# Patient Record
Sex: Female | Born: 1940 | Race: Black or African American | Hispanic: No | Smoking: Former smoker
Health system: Southern US, Community
[De-identification: ages and names within clinical notes are randomized; demographics above are authoritative.]

## PROBLEM LIST (undated history)

## (undated) DIAGNOSIS — M1990 Unspecified osteoarthritis, unspecified site: Secondary | ICD-10-CM

## (undated) DIAGNOSIS — H9312 Tinnitus, left ear: Secondary | ICD-10-CM

## (undated) DIAGNOSIS — K219 Gastro-esophageal reflux disease without esophagitis: Secondary | ICD-10-CM

## (undated) DIAGNOSIS — D249 Benign neoplasm of unspecified breast: Secondary | ICD-10-CM

## (undated) DIAGNOSIS — I773 Arterial fibromuscular dysplasia: Secondary | ICD-10-CM

## (undated) DIAGNOSIS — I739 Peripheral vascular disease, unspecified: Secondary | ICD-10-CM

## (undated) DIAGNOSIS — IMO0001 Reserved for inherently not codable concepts without codable children: Secondary | ICD-10-CM

## (undated) DIAGNOSIS — T7840XA Allergy, unspecified, initial encounter: Secondary | ICD-10-CM

## (undated) DIAGNOSIS — E785 Hyperlipidemia, unspecified: Secondary | ICD-10-CM

## (undated) DIAGNOSIS — H269 Unspecified cataract: Secondary | ICD-10-CM

## (undated) DIAGNOSIS — N189 Chronic kidney disease, unspecified: Secondary | ICD-10-CM

## (undated) DIAGNOSIS — I10 Essential (primary) hypertension: Secondary | ICD-10-CM

## (undated) DIAGNOSIS — Z923 Personal history of irradiation: Secondary | ICD-10-CM

## (undated) DIAGNOSIS — K5900 Constipation, unspecified: Secondary | ICD-10-CM

## (undated) DIAGNOSIS — K829 Disease of gallbladder, unspecified: Secondary | ICD-10-CM

## (undated) DIAGNOSIS — R0600 Dyspnea, unspecified: Secondary | ICD-10-CM

## (undated) HISTORY — DX: Constipation, unspecified: K59.00

## (undated) HISTORY — DX: Tinnitus, left ear: H93.12

## (undated) HISTORY — DX: Hyperlipidemia, unspecified: E78.5

## (undated) HISTORY — DX: Chronic kidney disease, unspecified: N18.9

## (undated) HISTORY — DX: Essential (primary) hypertension: I10

## (undated) HISTORY — DX: Gastro-esophageal reflux disease without esophagitis: K21.9

## (undated) HISTORY — PX: POLYPECTOMY: SHX149

## (undated) HISTORY — DX: Allergy, unspecified, initial encounter: T78.40XA

## (undated) HISTORY — DX: Reserved for inherently not codable concepts without codable children: IMO0001

## (undated) HISTORY — DX: Arterial fibromuscular dysplasia: I77.3

## (undated) HISTORY — PX: COLONOSCOPY: SHX174

## (undated) HISTORY — PX: UPPER GASTROINTESTINAL ENDOSCOPY: SHX188

## (undated) HISTORY — DX: Unspecified cataract: H26.9

## (undated) HISTORY — PX: TOTAL ABDOMINAL HYSTERECTOMY: SHX209

## (undated) HISTORY — PX: ANGIOPLASTY: SHX39

## (undated) HISTORY — PX: KNEE CARTILAGE SURGERY: SHX688

## (undated) HISTORY — DX: Unspecified osteoarthritis, unspecified site: M19.90

---

## 2002-01-06 ENCOUNTER — Encounter: Payer: Self-pay | Admitting: Family Medicine

## 2004-11-20 HISTORY — PX: BREAST BIOPSY: SHX20

## 2005-12-21 LAB — CONVERTED CEMR LAB: Pap Smear: NORMAL

## 2006-09-20 HISTORY — PX: OTHER SURGICAL HISTORY: SHX169

## 2006-12-13 ENCOUNTER — Encounter: Payer: Self-pay | Admitting: Family Medicine

## 2007-02-27 ENCOUNTER — Encounter: Payer: Self-pay | Admitting: Family Medicine

## 2007-03-13 ENCOUNTER — Ambulatory Visit: Payer: Self-pay | Admitting: Internal Medicine

## 2007-06-04 ENCOUNTER — Encounter: Payer: Self-pay | Admitting: Family Medicine

## 2007-06-04 LAB — CONVERTED CEMR LAB
Cholesterol: 231 mg/dL
Creatinine, Ser: 0.756 mg/dL
HDL: 50 mg/dL
LDL Cholesterol: 146 mg/dL
Triglycerides: 173 mg/dL

## 2007-08-28 ENCOUNTER — Ambulatory Visit: Payer: Self-pay | Admitting: Family Medicine

## 2007-08-28 DIAGNOSIS — E0590 Thyrotoxicosis, unspecified without thyrotoxic crisis or storm: Secondary | ICD-10-CM | POA: Insufficient documentation

## 2007-08-28 DIAGNOSIS — K219 Gastro-esophageal reflux disease without esophagitis: Secondary | ICD-10-CM | POA: Insufficient documentation

## 2007-08-28 DIAGNOSIS — K589 Irritable bowel syndrome without diarrhea: Secondary | ICD-10-CM | POA: Insufficient documentation

## 2007-08-28 DIAGNOSIS — E785 Hyperlipidemia, unspecified: Secondary | ICD-10-CM | POA: Insufficient documentation

## 2007-08-28 DIAGNOSIS — F411 Generalized anxiety disorder: Secondary | ICD-10-CM | POA: Insufficient documentation

## 2007-09-02 ENCOUNTER — Encounter: Admission: RE | Admit: 2007-09-02 | Discharge: 2007-09-02 | Payer: Self-pay | Admitting: Family Medicine

## 2007-09-03 ENCOUNTER — Encounter: Payer: Self-pay | Admitting: Family Medicine

## 2007-09-06 ENCOUNTER — Encounter (INDEPENDENT_AMBULATORY_CARE_PROVIDER_SITE_OTHER): Payer: Self-pay | Admitting: *Deleted

## 2007-09-16 ENCOUNTER — Encounter: Payer: Self-pay | Admitting: Family Medicine

## 2007-11-18 ENCOUNTER — Telehealth: Payer: Self-pay | Admitting: Family Medicine

## 2007-11-21 DIAGNOSIS — I773 Arterial fibromuscular dysplasia: Secondary | ICD-10-CM

## 2007-11-21 HISTORY — DX: Arterial fibromuscular dysplasia: I77.3

## 2007-11-26 ENCOUNTER — Ambulatory Visit: Payer: Self-pay | Admitting: Family Medicine

## 2007-11-28 ENCOUNTER — Ambulatory Visit: Payer: Self-pay | Admitting: Family Medicine

## 2007-12-04 ENCOUNTER — Encounter: Admission: RE | Admit: 2007-12-04 | Discharge: 2007-12-04 | Payer: Self-pay | Admitting: Family Medicine

## 2007-12-05 ENCOUNTER — Encounter (INDEPENDENT_AMBULATORY_CARE_PROVIDER_SITE_OTHER): Payer: Self-pay | Admitting: *Deleted

## 2007-12-05 LAB — CONVERTED CEMR LAB
BUN: 14 mg/dL (ref 6–23)
Cholesterol: 241 mg/dL (ref 0–200)
Creatinine, Ser: 0.8 mg/dL (ref 0.4–1.2)
Direct LDL: 172 mg/dL
HDL: 42.9 mg/dL (ref >39.0)
Total CHOL/HDL Ratio: 5.6
Triglycerides: 117 mg/dL (ref 0–149)
VLDL: 23 mg/dL (ref 0–40)

## 2007-12-13 ENCOUNTER — Ambulatory Visit: Payer: Self-pay | Admitting: Family Medicine

## 2007-12-19 ENCOUNTER — Telehealth: Payer: Self-pay | Admitting: Family Medicine

## 2007-12-23 ENCOUNTER — Encounter: Payer: Self-pay | Admitting: Family Medicine

## 2008-01-10 ENCOUNTER — Telehealth: Payer: Self-pay | Admitting: Family Medicine

## 2008-03-04 ENCOUNTER — Ambulatory Visit: Payer: Self-pay | Admitting: Family Medicine

## 2008-03-04 LAB — CONVERTED CEMR LAB
ALT: 19 units/L (ref 0–35)
AST: 22 units/L (ref 0–37)
Cholesterol: 178 mg/dL (ref 0–200)
HDL: 44.3 mg/dL (ref >39.0)
LDL Cholesterol: 112 mg/dL — ABNORMAL HIGH (ref 0–99)
Total CHOL/HDL Ratio: 4
Triglycerides: 109 mg/dL (ref 0–149)
VLDL: 22 mg/dL (ref 0–40)

## 2008-03-10 ENCOUNTER — Ambulatory Visit: Payer: Self-pay | Admitting: Family Medicine

## 2008-03-16 ENCOUNTER — Telehealth: Payer: Self-pay | Admitting: Family Medicine

## 2008-03-16 ENCOUNTER — Encounter (INDEPENDENT_AMBULATORY_CARE_PROVIDER_SITE_OTHER): Payer: Self-pay | Admitting: *Deleted

## 2008-03-20 ENCOUNTER — Encounter: Payer: Self-pay | Admitting: Family Medicine

## 2008-05-12 ENCOUNTER — Ambulatory Visit: Payer: Self-pay | Admitting: Family Medicine

## 2008-05-27 ENCOUNTER — Telehealth: Payer: Self-pay | Admitting: Family Medicine

## 2008-05-27 ENCOUNTER — Ambulatory Visit: Payer: Self-pay | Admitting: Family Medicine

## 2008-06-18 ENCOUNTER — Telehealth: Payer: Self-pay | Admitting: Family Medicine

## 2008-08-19 ENCOUNTER — Ambulatory Visit: Payer: Self-pay | Admitting: Family Medicine

## 2008-08-20 ENCOUNTER — Telehealth: Payer: Self-pay | Admitting: Family Medicine

## 2008-08-27 ENCOUNTER — Ambulatory Visit: Payer: Self-pay | Admitting: Family Medicine

## 2008-08-27 DIAGNOSIS — I773 Arterial fibromuscular dysplasia: Secondary | ICD-10-CM | POA: Insufficient documentation

## 2008-09-09 ENCOUNTER — Encounter: Admission: RE | Admit: 2008-09-09 | Discharge: 2008-09-09 | Payer: Self-pay | Admitting: Family Medicine

## 2008-09-11 ENCOUNTER — Encounter (INDEPENDENT_AMBULATORY_CARE_PROVIDER_SITE_OTHER): Payer: Self-pay | Admitting: *Deleted

## 2008-09-22 ENCOUNTER — Ambulatory Visit: Payer: Self-pay | Admitting: Family Medicine

## 2008-09-28 ENCOUNTER — Ambulatory Visit: Payer: Self-pay | Admitting: Family Medicine

## 2008-09-29 ENCOUNTER — Telehealth: Payer: Self-pay | Admitting: Family Medicine

## 2008-09-30 LAB — CONVERTED CEMR LAB
ALT: 15 units/L (ref 0–35)
AST: 19 units/L (ref 0–37)
Albumin: 3.7 g/dL (ref 3.5–5.2)
Alkaline Phosphatase: 65 units/L (ref 39–117)
BUN: 16 mg/dL (ref 6–23)
Basophils Absolute: 0 10*3/uL (ref 0.0–0.1)
Basophils Relative: 0 % (ref 0.0–3.0)
Bilirubin, Direct: 0.1 mg/dL (ref 0.0–0.3)
CO2: 28 meq/L (ref 19–32)
Calcium: 9.3 mg/dL (ref 8.4–10.5)
Chloride: 107 meq/L (ref 96–112)
Cholesterol: 158 mg/dL (ref 0–200)
Creatinine, Ser: 0.7 mg/dL (ref 0.4–1.2)
Eosinophils Absolute: 0.1 10*3/uL (ref 0.0–0.7)
Eosinophils Relative: 1.5 % (ref 0.0–5.0)
GFR calc Af Amer: 108 mL/min
GFR calc non Af Amer: 89 mL/min
Glucose, Bld: 107 mg/dL — ABNORMAL HIGH (ref 70–99)
HCT: 38.5 % (ref 36.0–46.0)
HDL: 48.7 mg/dL (ref >39.0)
Hemoglobin: 13 g/dL (ref 12.0–15.0)
LDL Cholesterol: 94 mg/dL (ref 0–99)
Lymphocytes Relative: 54.2 % — ABNORMAL HIGH (ref 12.0–46.0)
MCHC: 33.8 g/dL (ref 30.0–36.0)
MCV: 98 fL (ref 78.0–100.0)
Monocytes Absolute: 0.6 10*3/uL (ref 0.1–1.0)
Monocytes Relative: 7.4 % (ref 3.0–12.0)
Neutro Abs: 2.8 10*3/uL (ref 1.4–7.7)
Neutrophils Relative %: 36.9 % — ABNORMAL LOW (ref 43.0–77.0)
Platelets: 269 10*3/uL (ref 150–400)
Potassium: 4 meq/L (ref 3.5–5.1)
RBC: 3.93 M/uL (ref 3.87–5.11)
RDW: 12.2 % (ref 11.5–14.6)
Sodium: 141 meq/L (ref 135–145)
TSH: 2.16 microintl units/mL (ref 0.35–5.50)
Total Bilirubin: 0.6 mg/dL (ref 0.3–1.2)
Total CHOL/HDL Ratio: 3.2
Total Protein: 7.1 g/dL (ref 6.0–8.3)
Triglycerides: 75 mg/dL (ref 0–149)
VLDL: 15 mg/dL (ref 0–40)
WBC: 7.6 10*3/uL (ref 4.5–10.5)

## 2008-10-23 ENCOUNTER — Telehealth: Payer: Self-pay | Admitting: Family Medicine

## 2008-11-11 ENCOUNTER — Telehealth (INDEPENDENT_AMBULATORY_CARE_PROVIDER_SITE_OTHER): Payer: Self-pay | Admitting: *Deleted

## 2008-11-17 ENCOUNTER — Ambulatory Visit: Payer: Self-pay | Admitting: Family Medicine

## 2008-11-19 ENCOUNTER — Encounter: Payer: Self-pay | Admitting: Family Medicine

## 2008-11-24 ENCOUNTER — Encounter (INDEPENDENT_AMBULATORY_CARE_PROVIDER_SITE_OTHER): Payer: Self-pay | Admitting: *Deleted

## 2008-11-25 LAB — CONVERTED CEMR LAB
Catecholamines Tot(E+NE) 24 Hr U: 0.076 mg/24hr
Dopamine 24 Hr Urine: 172 mcg/24hr (ref <500)
Epinephrine 24 Hr Urine: 4 mcg/24hr (ref <20)
Metaneph Total, Ur: 807 ug/24hr (ref 224–832)
Metanephrines, Ur: 248 (ref 90–315)
Norepinephrine 24 Hr Urine: 72 mcg/24hr (ref <80)
Normetanephrine, 24H Ur: 559 (ref 122–676)

## 2008-12-28 ENCOUNTER — Ambulatory Visit: Payer: Self-pay | Admitting: Family Medicine

## 2009-02-01 ENCOUNTER — Ambulatory Visit: Payer: Self-pay | Admitting: Family Medicine

## 2009-02-01 DIAGNOSIS — M545 Low back pain, unspecified: Secondary | ICD-10-CM | POA: Insufficient documentation

## 2009-02-02 ENCOUNTER — Telehealth: Payer: Self-pay | Admitting: Family Medicine

## 2009-02-08 ENCOUNTER — Telehealth: Payer: Self-pay | Admitting: Family Medicine

## 2009-02-14 ENCOUNTER — Emergency Department (HOSPITAL_COMMUNITY): Admission: EM | Admit: 2009-02-14 | Discharge: 2009-02-14 | Payer: Self-pay | Admitting: Pulmonary Disease

## 2009-02-15 ENCOUNTER — Ambulatory Visit: Payer: Self-pay | Admitting: Family Medicine

## 2009-02-15 DIAGNOSIS — I10 Essential (primary) hypertension: Secondary | ICD-10-CM | POA: Insufficient documentation

## 2009-02-15 LAB — CONVERTED CEMR LAB
Bilirubin Urine: NEGATIVE
Cholesterol, target level: 200 mg/dL
Glucose, Urine, Semiquant: NEGATIVE
HDL goal, serum: 40 mg/dL
Ketones, urine, test strip: NEGATIVE
LDL Goal: 130 mg/dL
Nitrite: NEGATIVE
Specific Gravity, Urine: >=1.03
Urobilinogen, UA: 0.2
WBC Urine, dipstick: NEGATIVE
pH: 6

## 2009-02-18 DIAGNOSIS — IMO0001 Reserved for inherently not codable concepts without codable children: Secondary | ICD-10-CM

## 2009-02-18 HISTORY — DX: Reserved for inherently not codable concepts without codable children: IMO0001

## 2009-02-24 ENCOUNTER — Ambulatory Visit: Payer: Self-pay | Admitting: Cardiology

## 2009-03-10 ENCOUNTER — Ambulatory Visit: Payer: Self-pay | Admitting: Cardiology

## 2009-03-10 LAB — CONVERTED CEMR LAB
BUN: 15 mg/dL (ref 6–23)
CO2: 25 meq/L (ref 19–32)
Calcium: 9.6 mg/dL (ref 8.4–10.5)
Chloride: 104 meq/L (ref 96–112)
Cholesterol: 245 mg/dL — ABNORMAL HIGH (ref 0–200)
Creatinine, Ser: 0.78 mg/dL (ref 0.40–1.20)
Glucose, Bld: 112 mg/dL — ABNORMAL HIGH (ref 70–99)
HDL: 55 mg/dL (ref >39)
LDL Cholesterol: 149 mg/dL — ABNORMAL HIGH (ref 0–99)
Potassium: 3.6 meq/L (ref 3.5–5.3)
Sodium: 141 meq/L (ref 135–145)
Total CHOL/HDL Ratio: 4.5
Triglycerides: 206 mg/dL — ABNORMAL HIGH (ref <150)
VLDL: 41 mg/dL — ABNORMAL HIGH (ref 0–40)

## 2009-03-16 ENCOUNTER — Ambulatory Visit: Payer: Self-pay

## 2009-03-16 ENCOUNTER — Encounter: Payer: Self-pay | Admitting: Cardiology

## 2009-03-25 ENCOUNTER — Encounter (INDEPENDENT_AMBULATORY_CARE_PROVIDER_SITE_OTHER): Payer: Self-pay | Admitting: *Deleted

## 2009-03-25 ENCOUNTER — Ambulatory Visit: Payer: Self-pay | Admitting: Cardiology

## 2009-04-07 ENCOUNTER — Encounter: Payer: Self-pay | Admitting: Cardiology

## 2009-04-07 ENCOUNTER — Ambulatory Visit: Payer: Self-pay

## 2009-06-14 ENCOUNTER — Telehealth: Payer: Self-pay | Admitting: Family Medicine

## 2009-06-15 ENCOUNTER — Telehealth: Payer: Self-pay | Admitting: Family Medicine

## 2009-06-17 ENCOUNTER — Ambulatory Visit: Payer: Self-pay | Admitting: Family Medicine

## 2009-06-18 LAB — CONVERTED CEMR LAB: TSH: 1.87 microintl units/mL (ref 0.35–5.50)

## 2009-06-21 ENCOUNTER — Ambulatory Visit: Payer: Self-pay | Admitting: Internal Medicine

## 2009-06-21 ENCOUNTER — Encounter: Payer: Self-pay | Admitting: Internal Medicine

## 2009-06-22 ENCOUNTER — Ambulatory Visit: Payer: Self-pay | Admitting: Cardiology

## 2009-06-22 LAB — CONVERTED CEMR LAB
ALT: 18 units/L (ref 0–35)
AST: 21 units/L (ref 0–37)
Albumin: 4.4 g/dL (ref 3.5–5.2)
Alkaline Phosphatase: 54 units/L (ref 39–117)
Bilirubin, Direct: 0.1 mg/dL (ref 0.0–0.3)
Cholesterol: 182 mg/dL (ref 0–200)
HDL: 58 mg/dL (ref >39)
Indirect Bilirubin: 0.2 mg/dL (ref 0.0–0.9)
LDL Cholesterol: 96 mg/dL (ref 0–99)
Total Bilirubin: 0.3 mg/dL (ref 0.3–1.2)
Total CHOL/HDL Ratio: 3.1
Total Protein: 7.3 g/dL (ref 6.0–8.3)
Triglycerides: 142 mg/dL (ref <150)
VLDL: 28 mg/dL (ref 0–40)

## 2009-07-23 ENCOUNTER — Encounter: Payer: Self-pay | Admitting: Family Medicine

## 2009-08-11 ENCOUNTER — Telehealth: Payer: Self-pay | Admitting: Family Medicine

## 2009-08-12 ENCOUNTER — Ambulatory Visit: Payer: Self-pay | Admitting: Family Medicine

## 2009-08-12 DIAGNOSIS — B351 Tinea unguium: Secondary | ICD-10-CM | POA: Insufficient documentation

## 2009-08-20 ENCOUNTER — Telehealth: Payer: Self-pay | Admitting: Family Medicine

## 2009-09-14 ENCOUNTER — Telehealth: Payer: Self-pay | Admitting: Family Medicine

## 2009-10-25 ENCOUNTER — Telehealth: Payer: Self-pay | Admitting: Family Medicine

## 2009-10-26 ENCOUNTER — Ambulatory Visit: Payer: Self-pay | Admitting: Family Medicine

## 2009-10-26 LAB — CONVERTED CEMR LAB
Bilirubin Urine: NEGATIVE
Glucose, Urine, Semiquant: NEGATIVE
Ketones, urine, test strip: NEGATIVE
Nitrite: NEGATIVE
Protein, U semiquant: 30
Specific Gravity, Urine: 1.025
Urobilinogen, UA: 0.2
pH: 6

## 2009-10-27 ENCOUNTER — Encounter: Payer: Self-pay | Admitting: Family Medicine

## 2009-12-21 ENCOUNTER — Ambulatory Visit: Payer: Self-pay | Admitting: Family Medicine

## 2009-12-22 LAB — CONVERTED CEMR LAB
ALT: 19 units/L (ref 0–35)
AST: 18 units/L (ref 0–37)
Albumin: 4.1 g/dL (ref 3.5–5.2)
Alkaline Phosphatase: 57 units/L (ref 39–117)
BUN: 13 mg/dL (ref 6–23)
Bilirubin, Direct: 0 mg/dL (ref 0.0–0.3)
CO2: 29 meq/L (ref 19–32)
Calcium: 9.8 mg/dL (ref 8.4–10.5)
Chloride: 104 meq/L (ref 96–112)
Cholesterol: 235 mg/dL — ABNORMAL HIGH (ref 0–200)
Creatinine, Ser: 0.8 mg/dL (ref 0.4–1.2)
Direct LDL: 166.4 mg/dL
GFR calc non Af Amer: 92.13 mL/min (ref >60)
Glucose, Bld: 110 mg/dL — ABNORMAL HIGH (ref 70–99)
HDL: 51.2 mg/dL (ref >39.00)
Potassium: 3.8 meq/L (ref 3.5–5.1)
Sodium: 140 meq/L (ref 135–145)
Total Bilirubin: 0.3 mg/dL (ref 0.3–1.2)
Total CHOL/HDL Ratio: 5
Total Protein: 7.4 g/dL (ref 6.0–8.3)
Triglycerides: 139 mg/dL (ref 0.0–149.0)
VLDL: 27.8 mg/dL (ref 0.0–40.0)

## 2009-12-27 ENCOUNTER — Encounter: Payer: Self-pay | Admitting: Family Medicine

## 2009-12-29 ENCOUNTER — Telehealth: Payer: Self-pay | Admitting: Family Medicine

## 2010-01-05 ENCOUNTER — Ambulatory Visit: Payer: Self-pay | Admitting: Vascular Surgery

## 2010-01-07 ENCOUNTER — Encounter: Payer: Self-pay | Admitting: Family Medicine

## 2010-01-10 ENCOUNTER — Telehealth: Payer: Self-pay | Admitting: Family Medicine

## 2010-01-21 ENCOUNTER — Encounter: Payer: Self-pay | Admitting: Family Medicine

## 2010-01-26 ENCOUNTER — Inpatient Hospital Stay: Payer: Self-pay | Admitting: Vascular Surgery

## 2010-01-29 ENCOUNTER — Emergency Department: Payer: Self-pay | Admitting: Emergency Medicine

## 2010-02-18 ENCOUNTER — Encounter: Payer: Self-pay | Admitting: Family Medicine

## 2010-03-09 ENCOUNTER — Telehealth: Payer: Self-pay | Admitting: Family Medicine

## 2010-03-30 ENCOUNTER — Ambulatory Visit: Payer: Self-pay | Admitting: Family Medicine

## 2010-03-30 DIAGNOSIS — S335XXA Sprain of ligaments of lumbar spine, initial encounter: Secondary | ICD-10-CM

## 2010-03-30 DIAGNOSIS — S339XXA Sprain of unspecified parts of lumbar spine and pelvis, initial encounter: Secondary | ICD-10-CM | POA: Insufficient documentation

## 2010-04-28 ENCOUNTER — Encounter: Admission: RE | Admit: 2010-04-28 | Discharge: 2010-04-28 | Payer: Self-pay | Admitting: Family Medicine

## 2010-05-09 ENCOUNTER — Telehealth: Payer: Self-pay | Admitting: Family Medicine

## 2010-09-09 ENCOUNTER — Telehealth (INDEPENDENT_AMBULATORY_CARE_PROVIDER_SITE_OTHER): Payer: Self-pay | Admitting: *Deleted

## 2010-09-12 ENCOUNTER — Ambulatory Visit: Payer: Self-pay | Admitting: Family Medicine

## 2010-09-13 LAB — CONVERTED CEMR LAB
ALT: 15 units/L (ref 0–35)
AST: 19 units/L (ref 0–37)
Albumin: 3.8 g/dL (ref 3.5–5.2)
Alkaline Phosphatase: 59 units/L (ref 39–117)
BUN: 13 mg/dL (ref 6–23)
Bilirubin, Direct: 0.1 mg/dL (ref 0.0–0.3)
CO2: 28 meq/L (ref 19–32)
Calcium: 9.7 mg/dL (ref 8.4–10.5)
Chloride: 103 meq/L (ref 96–112)
Cholesterol: 259 mg/dL — ABNORMAL HIGH (ref 0–200)
Creatinine, Ser: 0.7 mg/dL (ref 0.4–1.2)
Direct LDL: 177.8 mg/dL
GFR calc non Af Amer: 107.25 mL/min (ref >60)
Glucose, Bld: 108 mg/dL — ABNORMAL HIGH (ref 70–99)
HDL: 49.2 mg/dL (ref >39.00)
Potassium: 3.9 meq/L (ref 3.5–5.1)
Sodium: 140 meq/L (ref 135–145)
TSH: 2.13 microintl units/mL (ref 0.35–5.50)
Total Bilirubin: 0.5 mg/dL (ref 0.3–1.2)
Total CHOL/HDL Ratio: 5
Total Protein: 6.8 g/dL (ref 6.0–8.3)
Triglycerides: 143 mg/dL (ref 0.0–149.0)
VLDL: 28.6 mg/dL (ref 0.0–40.0)

## 2010-09-14 ENCOUNTER — Ambulatory Visit: Payer: Self-pay | Admitting: Family Medicine

## 2010-09-14 DIAGNOSIS — R7303 Prediabetes: Secondary | ICD-10-CM | POA: Insufficient documentation

## 2010-09-14 DIAGNOSIS — M810 Age-related osteoporosis without current pathological fracture: Secondary | ICD-10-CM | POA: Insufficient documentation

## 2010-09-29 ENCOUNTER — Telehealth: Payer: Self-pay | Admitting: Family Medicine

## 2010-11-03 ENCOUNTER — Ambulatory Visit: Payer: Self-pay | Admitting: Family Medicine

## 2010-11-03 DIAGNOSIS — N390 Urinary tract infection, site not specified: Secondary | ICD-10-CM | POA: Insufficient documentation

## 2010-11-03 LAB — CONVERTED CEMR LAB
Bilirubin Urine: NEGATIVE
Glucose, Urine, Semiquant: NEGATIVE
Ketones, urine, test strip: NEGATIVE
Nitrite: NEGATIVE
Protein, U semiquant: NEGATIVE
Specific Gravity, Urine: >=1.03
Urobilinogen, UA: 0.2
pH: 6

## 2010-11-04 ENCOUNTER — Encounter: Payer: Self-pay | Admitting: Family Medicine

## 2010-12-11 ENCOUNTER — Encounter: Payer: Self-pay | Admitting: Family Medicine

## 2010-12-19 ENCOUNTER — Ambulatory Visit
Admission: RE | Admit: 2010-12-19 | Discharge: 2010-12-19 | Payer: Self-pay | Source: Home / Self Care | Attending: Family Medicine | Admitting: Family Medicine

## 2010-12-19 ENCOUNTER — Other Ambulatory Visit: Payer: Self-pay | Admitting: Family Medicine

## 2010-12-19 LAB — LIPID PANEL
Cholesterol: 171 mg/dL (ref 0–200)
HDL: 49.3 mg/dL (ref >39.00)
LDL Cholesterol: 97 mg/dL (ref 0–99)
Total CHOL/HDL Ratio: 3
Triglycerides: 125 mg/dL (ref 0.0–149.0)
VLDL: 25 mg/dL (ref 0.0–40.0)

## 2010-12-19 LAB — GLUCOSE, RANDOM: Glucose, Bld: 91 mg/dL (ref 70–99)

## 2010-12-21 NOTE — Letter (Signed)
Summary: IVP,Gallbladder U/S,Renal U/S  IVP,Gallbladder U/S,Renal U/S   Imported By: Beau Fanny 11/25/2007 14:44:13  _____________________________________________________________________  External Attachment:    Type:   Image     Comment:   External Document

## 2010-12-21 NOTE — Progress Notes (Signed)
Summary: wants to take crestor   Phone Note Call from Patient Call back at Home Phone [phone]   Caller: Patient Call For: Kerby Nora MD Summary of Call: Patient says that she was told to try and work and diet and exercise for 3 months and then if chol still high that you would put her back on crestor. Patient is calling to ask if you would be willing to go ahead and put her on the crestor to help w/ her cholesterol. [facility]ses [facility]. Pleae advise.  Initial call taken by: Melody Comas,  September 29, 2010 4:11 PM  Follow-up for Phone Call        Make sure she has 3 month follow up labs. Follow-up by: Kerby Nora MD,  September 30, 2010 8:25 AM  Additional Follow-up for Phone Call Additional follow up Details #1::        patient has appt in jan for labs Additional Follow-up by: Benny Lennert CMA Duncan Dull),  September 30, 2010 8:29 AM    New/Updated Medications: CRESTOR 10 MG TABS (ROSUVASTATIN CALCIUM) 1 tab by mouth daily Prescriptions: CRESTOR 10 MG TABS (ROSUVASTATIN CALCIUM) 1 tab by mouth daily  #30 x 11   Entered and Authorized by:   Kerby Nora MD   Signed by:   Kerby Nora MD on 09/30/2010   Method used:   Electronically to        [facility] (retail)       [address]       [place], [place]  [address]       Ph: [phone] or [phone]       Fax: [phone]   RxID:   [id number]

## 2010-12-21 NOTE — Letter (Signed)
Summary: [facility]  [facility]   Imported By: Lanelle Bal 01/12/2010 12:26:55  _____________________________________________________________________  External Attachment:    Type:   Image     Comment:   External Document

## 2010-12-21 NOTE — Assessment & Plan Note (Signed)
Summary: CPX,FLU SHOT/CLE   Vital Signs:  Patient profile:   70 year old female Height:      62.25 inches Weight:      174.0 pounds BMI:     31.68 Temp:     98.4 degrees F oral Pulse rate:   80 / minute Pulse rhythm:   regular BP sitting:   110 / 80  (left arm) Cuff size:   regular  Vitals Entered By: Benny Lennert CMA Duncan Dull) (September 14, 2010 1:58 PM)  History of Present Illness: Chief complaint annual well ness visit  The patient is here for annual wellness exam and preventative care.     She has the following chronic health issues.  HTN, well controlled on current meds...toprol and chlorthalidone.  Still occ stressful episodes..causing BP elevation 114/100. ENT thought that ringing in ear  not due to hearing loss...felt due to focusing on tinnitus and resulting anxiety  Working on relaxation and deep breathing. .  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 causing Bp elevations. [place].   High cholesterol, well controlled on no medication.   Lipid Management History:      Positive NCEP/ATP III risk factors include female age 43 years old or older and hypertension.  Negative NCEP/ATP III risk factors include non-tobacco-user status.        Her compliance with the TLC diet is good.  Adjunctive measures started by the patient include aerobic exercise, ASA, limit alcohol consumpton, and weight reduction.  She expresses no side effects from her lipid-lowering medication.  The patient denies any symptoms to suggest myopathy or liver disease.     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 1 P Once Daily 2)  Toprol Xl 100 Mg  Tb24 (Metoprolol Succinate) .... Take 1 Tablet By Mouth Once Daily 3)  Vitamin D 1000 Unit  Tabs (Cholecalciferol) .... Take 1 Tablet By Mouth Once A Day 4)  Chlorthalidone 25 Mg Tabs (Chlorthalidone) .... Take 1/2  Tablet By Mouth Every Other Day 5)  Penlac 8 % Soln (Ciclopirox) .... Apply To Toenails Daily  Allergies (verified): No Known Drug Allergies  Past History:  Past medical, surgical, family and social histories (including risk factors) reviewed, and no changes noted (except as noted below).  Past Medical History: Reviewed history from 06/22/2009 and no changes required. 1. Hypertension.  The patient has been hypertensive for 16 years.  She     had urinary catecholamines within normal limits December 2009.  She     also had a renal Doppler ultrasound that showed no evidence for     renal artery stenosis. 2. 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  This was     diagnosed by MRI in January 2009.  She is followed by Dr. Earnestine Leys of     vascular surgery here in [place]. 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 4. Hyperlipidemia. 5. Echocardiogram done April 2010 showing LVEF 65%.  No  regional wall     motion abnormalities.  There is normal RV size and function.     Interestingly, the pulmonic valve had an increased gradient across     with a peak gradient of about 36 mmHg which is in the range of     moderate pulmonic stenosis.  However, this was a technically     difficult study.  Followup limited echo specifically to address the pulmonic valve showed a normal valve.   6. GERD  Past Surgical History:  Cardiolyte 11/076 neg Hysterectomy, total abdominal   Family History: Reviewed history from 06/22/2009 and no  changes required. father died age 72 prostate cancer mother died age 46 Lou Gehrig's disease 1 brother and sister healthy PGM: CAD unsure of maternal family history: ? goiter  Social History: Reviewed history from 06/22/2009 and no changes required. Occupation: retired Radiographer, therapeutic widowed 04-13-2006, died from colon cancer no children Former Smoker: 15 pack year history Alcohol use-yes wine every 2 weeks Drug use-no Regular exercise-yes, exercising at [employer] Diet: fruit and veggies, snacks a lot  Review of Systems General:  Denies fatigue and fever. CV:  Denies chest pain or discomfort and swelling of feet. Resp:  Denies shortness of breath. GI:  Denies abdominal pain and bloody stools. GU:  Denies abnormal vaginal bleeding and dysuria.  Physical Exam  General:  obese appearing female in NAD Ears:  External ear exam shows no significant lesions or deformities.  Otoscopic examination reveals clear canals, tympanic membranes are intact bilaterally without bulging, retraction, inflammation or discharge. Hearing is grossly normal bilaterally. Nose:  External nasal examination shows no deformity or inflammation. Nasal mucosa are pink and moist without lesions or exudates. Mouth:  Oral mucosa and oropharynx without lesions or exudates.  Teeth in good repair. Neck:  no carotid bruit or thyromegaly no cervical or supraclavicular lymphadenopathy  Chest Wall:  No deformities, masses, or tenderness noted. Breasts:  No mass, nodules, thickening, tenderness, bulging, retraction, inflamation, nipple discharge or skin changes noted.   Lungs:  Normal respiratory effort, chest expands symmetrically. Lungs are clear to auscultation, no crackles or wheezes. Heart:  Normal rate and regular rhythm. S1 and S2 normal without gallop, murmur, click, rub or other extra sounds. Abdomen:  Bowel sounds positive,abdomen soft and non-tender without masses, organomegaly or hernias noted. Genitalia:  not  indicated Pulses:  R and L posterior tibial pulses are full and equal bilaterally  Extremities:  no edema Skin:  Intact without suspicious lesions or rashes Psych:  Cognition and judgment appear intact. Alert and cooperative with normal attention span and concentration. No apparent delusions, illusions, hallucinations   Impression & Recommendations:  Problem # 1:  OSTEOPENIA (ICD-733.90) Due for bone density.  Her updated medication list for this problem includes:    Vitamin D 1000 Unit Tabs (Cholecalciferol) .Marland Kitchen... Take 1 tablet by mouth once a day  Orders: Radiology Referral (Radiology)  Problem # 2:  HYPERTENSION, BENIGN ESSENTIAL, LABILE (ICD-401.1)  Well controlled. Continue current medication.  Her updated medication list for this problem includes:    Toprol Xl 100 Mg Tb24 (Metoprolol succinate) .Marland Kitchen... Take 1 tablet by mouth once daily    Chlorthalidone 25 Mg Tabs (Chlorthalidone) .Marland Kitchen... 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  Problem # 4:  ANXIETY DISORDER, GENERALIZED (ICD-300.02) Stable on no medicaiton. Working on stress reduction.   Problem # 5:  HYPERLIPIDEMIA (ICD-272.4) Poor control.. Discussed lifestyle changes...recheck in 3 month...if not at goal.  Problem # 6:  HYPERTHYROIDISM (ICD-242.90) TSH: 2.13 (09/12/2010 9:11:01 AM) stable Her updated medication list for this problem includes:    Toprol Xl 100 Mg Tb24 (Metoprolol succinate) .Marland Kitchen... Take 1 tablet by mouth once daily  Problem # 7:  PREDIABETES (ICD-790.29) Improved control. Encouraged exercise, weight loss, healthy eating habits.   Complete  Medication List: 1)  Adult Aspirin Ec Low Strength 81 Mg Tbec (Aspirin) .Marland Kitchen.. 1 p once daily 2)  Toprol Xl 100 Mg Tb24 (Metoprolol succinate) .... Take 1 tablet by mouth once daily 3)  Vitamin D 1000 Unit Tabs (Cholecalciferol) .... Take 1 tablet by mouth once a day 4)  Chlorthalidone 25 Mg Tabs (Chlorthalidone) .... Take 1/2  tablet by mouth every other day 5)  Penlac 8 % Soln (Ciclopirox) .... Apply to toenails daily  Other Orders: Flu Vaccine 48yrs + MEDICARE PATIENTS (Z6109) Administration Flu vaccine - MCR (G0008)  Lipid Assessment/Plan:      Based on NCEP/ATP III, the patient's risk factor category is "2 or more risk factors and a calculated 10 year CAD risk of < 20%".  The patient's lipid goals are as follows: Total cholesterol goal is 200; LDL cholesterol goal is 130; HDL cholesterol goal is 40; Triglyceride goal is 150.  Her LDL cholesterol goal has been met.    Patient Instructions: 1)  Work on weight loss, regualr exercsie and low carb diet. 2)  Low cholesterol diet. 3)  Decrease snacking. 4)  Try almonds a day. 5)   Recheck fasting LIPIDS,   in 3 months Dx 272.0    6)  Call insurance to consider shingles vaccine coverage. 7)  Referral Appointment Information 8)  Day/Date: 9)  Time: 10)  Place/MD: 11)  Address: 12)  Phone/Fax: 13)  Patient given appointment information. Information/Orders faxed/mailed.    Orders Added: 1)  Flu Vaccine 14yrs + MEDICARE PATIENTS [Q2039] 2)  Administration Flu vaccine - MCR [G0008] 3)  Radiology Referral [Radiology] 4)  Est. Patient Level IV [60454]    Current Allergies (reviewed today): No known allergies            Flu Vaccine Consent Questions     Do you have a history of severe allergic reactions to this vaccine? no    Any prior history of allergic reactions to egg and/or gelatin? no    Do you have a sensitivity to the preservative Thimersol? no    Do you have a past history of Guillan-Barre Syndrome? no    Do you  currently have an acute febrile illness? no    Have you ever had a severe reaction to latex? no    Vaccine information given and explained to patient? yes    Are you currently pregnant? no    Lot Number:[id number]   Exp Date:05/20/2011   Site Given  Left Deltoid [id number]  Last Flu Vaccine:  fluarix (08/12/2009 3:40:12 PM) Flu Vaccine Result Date:  09/14/2010 Flu Vaccine Result:  given Flu Vaccine Next Due:  1 yr Last PAP:  Normal (12/21/2005 2:39:16 PM) PAP Next Due:  Not Indicated       Past Medical History:    Reviewed  history from 06/22/2009 and no changes required:       1. Hypertension.  The patient has been hypertensive for 16 years.  She           had urinary catecholamines within normal limits December 2009.  She           also had a renal Doppler ultrasound that showed no evidence for           renal artery stenosis.       2. 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  This was           diagnosed by MRI in January 2009.  She is followed by Dr. Earnestine Leys of           vascular surgery here in [place].       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       4. Hyperlipidemia.       5. Echocardiogram done April 2010 showing LVEF 65%.  No regional wall           motion abnormalities.  There is normal RV size and function.           Interestingly, the pulmonic valve had an increased gradient across           with a peak gradient of about 36 mmHg which is in the range of           moderate pulmonic stenosis.  However, this was a technically           difficult study.  Followup limited echo specifically to address the pulmonic valve showed a normal valve.         6. GERD  Past Surgical History:    Reviewed history from 04/09/2009 and no changes required:        Cardiolyte 11/076 neg       Hysterectomy, total abdominal

## 2010-12-21 NOTE — Consult Note (Signed)
Summary: [facility]/Consultation Report/Dr. Jenne Campus  [facility]/Consultation Report/Dr. Jenne Campus   Imported By: Mickle Asper 10/09/2007 09:59:09  _____________________________________________________________________  External Attachment:    Type:   Image     Comment:   External Document  Appended Document: [facility]/Consultation Report/Dr. 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  I would encourage her to either return to see ENT or for [place] to order this.  Appended Document: [facility]/Consultation Report/Dr. Jenne Campus Patient Advised.   Patient states she cannot afford that right now but will follow that avenue when she is able to do so.

## 2010-12-21 NOTE — Letter (Signed)
Summary: [facility]  [facility]   Imported By: Lanelle Bal 01/28/2010 13:39:01  _____________________________________________________________________  External Attachment:    Type:   Image     Comment:   External Document

## 2010-12-21 NOTE — Progress Notes (Signed)
----   Converted from flag ---- ---- 09/08/2010 11:10 PM, Kerby Nora MD wrote: Dx CMET, lipids Dx 272. 0, TSH Dx 242.90  ---- 09/08/2010 9:51 AM, Liane Comber CMA (AAMA) wrote: Lab orders please! Good Morning! This pt is scheduled for cpx labs Monday, which labs to draw and dx codes to use? Thanks Tasha ------------------------------

## 2010-12-21 NOTE — Progress Notes (Signed)
Summary: waiting for notes from vein clinic  Phone Note Call from Patient Call back at Home Phone [phone]   Caller: Patient Call For: Kerby Nora MD Summary of Call: Pt saw Dr Earnestine Leys on 01/07/10.  He told her that she needs carotid vein surgery, but she is asking you if she should get a 2nd opinion.  I have called that office for the notes, but they arent ready yet.  They will send them when they are. Initial call taken by: Lowella Petties CMA,  January 10, 2010 8:29 AM     Appended Document: waiting for notes from vein clinic Notify pt after reviewing notes...she has significant blockage of the left carotid artery not from cholesterol plaque but from the fibromuscular dysplasia..AND she is symptomatic. I have had good experience with Dr. Earnestine Leys. I would recommend going forward with the surgery,as long as she understands the risk and benifts as he has gone through with her.  If he needs [place] to provide preop eval...please let me know.   Appended Document: waiting for notes from vein clinic Patient advised.Consuello Masse CMA

## 2010-12-21 NOTE — Progress Notes (Signed)
Summary: wants referral to dermatologist  Phone Note Call from Patient Call back at [phone]   Caller: Patient Call For: Kerby Nora MD Summary of Call: Pt states her hair is falling out at the top.  She is asking what you think she should do.  TSH was normal in november.  Can you refer her to a dermatologist in [place]? 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  Follow-up by: Kerby Nora MD,  June 14, 2009 9:34 AM  Additional Follow-up for Phone Call Additional follow up Details #1::        DR [facility] ON 07/23/2009 AT 10:15. Carlton Adam  June 14, 2009 10:42 AM  Additional Follow-up by: Carlton Adam,  June 14, 2009 10:42 AM  New Problems: ALOPECIA (ICD-704.00)   New Problems: ALOPECIA (ICD-704.00)

## 2010-12-21 NOTE — Letter (Signed)
Summary: [facility]  [facility]   Imported By: Lanelle Bal 02/24/2010 14:20:10  _____________________________________________________________________  External Attachment:    Type:   Image     Comment:   External Document

## 2010-12-21 NOTE — Letter (Signed)
Summary: [facility]  [facility]   Imported By: Lanelle Bal 01/12/2010 12:29:33  _____________________________________________________________________  External Attachment:    Type:   Image     Comment:   External Document

## 2010-12-21 NOTE — Progress Notes (Signed)
Summary: wants to start back on lamisil  Phone Note Call from Patient Call back at Home Phone [phone]   Caller: Patient Call For: Dr. Patsy Lager Summary of Call: Pt had surgery on march 19 th and is now taking plavix 75 mg's, one every other day.  She had taken generic lamisil prior to having her surgery but she stopped it.  She is asking if she can start this back.  She was taking it for her toenails, which have almost cleared up.  OK to take the plavix with the lamisil? Initial call taken by: Lowella Petties CMA,  March 09, 2010 8:52 AM  Follow-up for Phone Call        yes Follow-up by: Hannah Beat MD,  March 09, 2010 8:53 AM  Additional Follow-up for Phone Call Additional follow up Details #1::        patient advised.Consuello Masse CMA  Additional Follow-up by: Benny Lennert CMA Duncan Dull),  March 09, 2010 8:58 AM

## 2010-12-21 NOTE — Assessment & Plan Note (Signed)
Summary: mouth feeling dry   Vital Signs:  Patient profile:   70 year old female Height:      62.25 inches Weight:      179.4 pounds BMI:     32.67 Temp:     97.9 degrees F oral Pulse rate:   72 / minute Pulse rhythm:   regular BP sitting:   150 / 88  (left arm) Cuff size:   regular  Vitals Entered By: Benny Lennert CMA Duncan Dull) (December 21, 2009 9:18 AM)  History of Present Illness: Chief complaint mouth feeling dry  Intermittant dry mouth in past few months. No new OTC meds. "I would like to have blood sugar chacked given family history"  Has increased treadmill in last week (does not stretch prior to exercise)..began having right low back..radaiting to right foot. no numbenss, no weakness. Improved after rest for 2 days. Used tylenol.  Hs upcoming OV with vascular surgeon about fibromusclar dysplasia ..continues to have pulsitle tinitus  in left ear.     Hypertension History:      Has not yet taken chlorthalidone. Recently BPs have been well controlled.  110-120/70.Marland Kitcheneven under stress BP not shooting above 140 systolic. Marland Kitchen        Positive major cardiovascular risk factors include female age 62 years old or older, hyperlipidemia, and hypertension.  Negative major cardiovascular risk factors include non-tobacco-user status.     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 1 P Once Daily 2)  Toprol Xl 100 Mg  Tb24 (Metoprolol Succinate) .... Take 1 Tablet By Mouth Once Daily 3)  Vitamin D 1000 Unit  Tabs (Cholecalciferol) .... Take 1 Tablet By Mouth Once A Day 4)  Chlorthalidone 25 Mg Tabs (Chlorthalidone) .... Take 1/2  Tablet By Mouth Every Other Day 5)  Penlac 8 % Soln (Ciclopirox) .... Apply To Toenails Daily  Allergies (verified): No Known Drug Allergies  Past History:  Past medical, surgical, family and social histories (including risk factors) reviewed, and no changes noted (except as noted below).  Past Medical History: Reviewed history from 06/22/2009 and no changes required. 1. Hypertension.  The patient has been hypertensive for 16 years.  She     had urinary catecholamines within normal limits December 2009.  She     also had a renal Doppler ultrasound that showed no evidence for     renal artery stenosis. 2. 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  This was     diagnosed by MRI in January 2009.  She is followed by Dr. Earnestine Leys of     vascular surgery here in [place]. 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 4. Hyperlipidemia. 5. Echocardiogram done April 2010  showing LVEF 65%.  No regional wall     motion abnormalities.  There is normal RV size and function.     Interestingly, the pulmonic valve had an increased gradient across     with a peak gradient of about 36 mmHg which is in the range of     moderate pulmonic stenosis.  However, this was a technically     difficult study.  Followup limited echo specifically to address the pulmonic valve showed a normal valve.   6. GERD  Past Surgical History: Reviewed history from 04/09/2009 and no changes required.  Cardiolyte 11/076 neg  Family History: Reviewed history from  06/22/2009 and no changes required. father died age 27 prostate cancer mother died age 8 Lou Gehrig's disease 1 brother and sister healthy PGM: CAD unsure of maternal family history: ? goiter  Social History: Reviewed history from 06/22/2009 and no changes required. 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 CV:  Denies chest pain or discomfort. Resp:  Denies shortness of breath. GI:  Denies abdominal pain. GU:  Denies dysuria.  Physical Exam  General:  overwieght female in NAd Ears:  External ear exam shows no significant lesions or deformities.  Otoscopic examination reveals clear canals, tympanic membranes are intact bilaterally without bulging, retraction, inflammation or discharge. Hearing is grossly normal bilaterally. Nose:  External nasal examination shows no deformity or inflammation. Nasal mucosa are pink and moist without lesions or exudates. Mouth:  MMM Neck:  no carotid bruit or thyromegaly no cervical or supraclavicular lymphadenopathy  Lungs:  Normal respiratory effort, chest expands symmetrically. Lungs are clear to auscultation, no crackles or wheezes. Heart:  Normal rate and regular rhythm. S1 and S2 normal without gallop, murmur, click, rub or other extra sounds. Msk:  ttp right paraspinous muscle, no vertebral pain, ttp over right trochanter full ROM in B hips Pulses:  R and L posterior tibial pulses are full and equal bilaterally  Extremities:  no edema  Neurologic:  No cranial nerve deficits noted. Station and gait are normal. DTRs are symmetrical throughout. Sensory, motor and coordinative functions appear intact. Skin:  small soft fatty feeling nodules behind B  knees   Impression & Recommendations:  Problem # 1:  DRY MOUTH (ICD-527.7) ? due to wniter heat in doors, vs allergies. Will eval for diabetes. No new meds, but ? SE to chlorthalidone. Orders: TLB-BMP (Basic Metabolic Panel-BMET) (80048-METABOL) TLB-Hepatic/Liver Function Pnl (80076-HEPATIC)  Problem # 2:  BACK STRAIN, LUMBAR (ICD-847.2) Resolving..continue heat, tylenol, massage.  Start core strengthening and back stretching prior to exercise.   Problem # 3:  TROCHANTERIC BURSITIS, RIGHT (ICD-726.5) Likely related to new walking program. Info given on stretching...if not improving follow up for ? steroid injection.   Problem # 4:  HYPERTENSION, BENIGN ESSENTIAL, LABILE (ICD-401.1) Well controlled and stable on current meds.  Her updated medication list for this problem includes:    Toprol Xl 100 Mg Tb24 (Metoprolol succinate) .Marland Kitchen... Take 1 tablet by mouth once daily    Chlorthalidone 25 Mg Tabs (Chlorthalidone) .Marland Kitchen... Take 1/2  tablet by mouth every other day  Orders: TLB-Lipid Panel ([id number]-LIPID)  Complete Medication List: 1)  Adult Aspirin Ec Low Strength 81 Mg Tbec (  Aspirin) .Marland Kitchen.. 1 p once daily 2)  Toprol Xl 100 Mg Tb24 (Metoprolol succinate) .... Take 1 tablet by mouth once daily 3)  Vitamin D 1000 Unit Tabs (Cholecalciferol) .... Take 1 tablet by mouth once a day 4)  Chlorthalidone 25 Mg Tabs (Chlorthalidone) .... Take 1/2  tablet by mouth every other day 5)  Penlac 8 % Soln (Ciclopirox) .... Apply to toenails daily  Hypertension Assessment/Plan:      The patient's hypertensive risk group is category B: At least one risk factor (excluding diabetes) with no target organ damage.  Her calculated 10 year risk of coronary heart disease is 9 %.  Today's blood pressure is 150/88.  Her blood pressure goal is < 140/90.  Patient Instructions: 1)  Overdue for Complete physical exam..scheduled in next few months please.  2)  Call if back pain not improving with strtching, massage and  heat. Use tylenol as needed pain.  Current Allergies (reviewed today): No known allergies

## 2010-12-21 NOTE — Miscellaneous (Signed)
  Clinical Lists Changes  Observations: Added new observation of PAST SURG HX: ;  Cardiolyte 11/076 neg (09/16/2007 9:33) Added new observation of CREATININE: 0.756 mg/dL (16/08/9603 5:40) Added new observation of LDL: 146 mg/dL (98/09/9146 8:29) Added new observation of HDL: 50 mg/dL (56/21/3086 5:78) Added new observation of TRIGLYC TOT: 173 mg/dL (46/96/2952 8:41) Added new observation of CHOLESTEROL: 231 mg/dL (32/44/0102 7:25)  Notify pt records review.  I saw no consult report from ENT regarding qeustion of pulsing in ear.  Please obtain ENT info and get last OV note.  Also no Urologist consult note seen.  ..................................................................Marland KitchenKerby Nora MD  September 16, 2007 9:40 AM   Patient Advised. States she will come by and sign a Records Release form to Dr. Jenne Campus, ENT in [place] and she will secure the information that we need and sign a Records Release form to her Urologist in [place].

## 2010-12-21 NOTE — Progress Notes (Signed)
Summary: wants phone call   Phone Note Call from Patient Call back at Home Phone [phone]   Caller: Patient Call For: Herbert Seta Summary of Call: Patient is requesting a phone call from you. Her blood pressure is elevated and wants to discuss with you.  Initial call taken by: Melody Comas,  May 09, 2010 10:14 AM  Follow-up for Phone Call        Spoke with patient her blood pressure has been elevated pressures started running sky high this morning have been good since march. Pressure this morning 170/90, 171/84. Patient took 100mg  of metoprolol and 25mg  chloritidon and that brought it done 116/80 now. Last time this happened she had to have surgery on her carotid artery. Patient is going to see her vein specialist on july 8th. Is there any you want her to do until then.?  Additional Follow-up for Phone Call Additional follow up Details #1::        It looks like she normally takes 100 mg of metoprolol and 12.5 mg of chlorthalidone - is this correct?  In other words she took an extra 12.5 mg of chlorthalidone? I am unclear -- please explain.   Is she having any other symptoms? Additional Follow-up by: Hannah Beat MD,  May 09, 2010 12:49 PM    Additional Follow-up for Phone Call Additional follow up Details #2::    No symptoms at all just her blood pressure is high this happened in March also. She says she only take the   chlorthalidone every other day and it was only the 12.5 she took sorry i miss understood her. Patient says she is not sure why this is happening today unless she has another blockage. I think this was really just an FYI b/c she said if it gets to high she will go the the ER. Follow-up by: Benny Lennert CMA Duncan Dull),  May 09, 2010 1:48 PM  Additional Follow-up for Phone Call Additional follow up Details #3:: Details for Additional Follow-up Action Taken: It sounds as if she took her BP medications and is now normotensive.  For now with no symptoms, BP normal  -- I think OK to recheck it tomorrow after she takes her BP medication -- 2 hours afterwards.   Again, if her BP is now normal that is reassuring and likely not the case if this were emergently high. Completely different if BP remains 200 systolic level  Patient advised.Consuello Masse CMA   Additional Follow-up by: Hannah Beat MD,  May 09, 2010 2:16 PM

## 2010-12-21 NOTE — Assessment & Plan Note (Signed)
Summary: BACK PAIN/CLE   Vital Signs:  Patient profile:   70 year old female Height:      62.25 inches Weight:      175.13 pounds BMI:     31.89 Temp:     98.4 degrees F oral Pulse rate:   88 / minute Pulse rhythm:   regular BP sitting:   124 / 84  (left arm) Cuff size:   regular  Vitals Entered By: Delilah Shan CMA Duncan Dull) (Mar 30, 2010 12:29 PM) CC: Back pain   History of Present Illness: 1 week ago ..pain in right low back, to center of back. Intermittant radiation to right thigh No numbness, no weakness. Was putting swing together...lifted a box...pain resulted the following day.  No fall.  minimal improvement with tylenol.   Dizzyness and tinnitus resolved with carotid faibromuscular dysplasia surgery. on plavix for 3 months.    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 1 P Once Daily 2)  Toprol Xl 100 Mg  Tb24 (Metoprolol Succinate) .... Take 1 Tablet By Mouth Once Daily 3)  Vitamin D 1000 Unit  Tabs (Cholecalciferol) .... Take 1 Tablet By  Mouth Once A Day 4)  Chlorthalidone 25 Mg Tabs (Chlorthalidone) .... Take 1/2  Tablet By Mouth Every Other Day 5)  Penlac 8 % Soln (Ciclopirox) .... Apply To Toenails Daily 6)  Lamisil At 1 % Crea (Terbinafine Hcl) .... Use As Directed 7)  Plavix 75 Mg Tabs (Clopidogrel Bisulfate) .... Take 1 Tablet By Mouth Once A Day 8)  Vicodin 5-500 Mg Tabs (Hydrocodone-Acetaminophen) .Marland Kitchen.. 1 Tab By Mouth By Mouth Q6 Hours For Breakthru Pain  Allergies (verified): No Known Drug Allergies  Past History:  Past medical, surgical, family and social histories (including risk factors) reviewed, and no changes noted (except as noted below).  Past Medical History: Reviewed history from 06/22/2009 and no changes required. 1. Hypertension.  The patient has been hypertensive for 16 years.  She     had urinary catecholamines within normal limits December 2009.  She     also had a renal Doppler ultrasound that showed no evidence for     renal artery stenosis. 2. 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  This was     diagnosed by MRI in January 2009.  She is followed by Dr. Earnestine Leys of     vascular surgery here in [place]. 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 4. Hyperlipidemia. 5. Echocardiogram done April 2010 showing LVEF 65%.  No regional wall     motion abnormalities.  There is normal RV size  and function.     Interestingly, the pulmonic valve had an increased gradient across     with a peak gradient of about 36 mmHg which is in the range of     moderate pulmonic stenosis.  However, this was a technically     difficult study.  Followup limited echo specifically to address the pulmonic valve showed a normal valve.   6. GERD  Past Surgical History: Reviewed history from 04/09/2009 and no changes required.  Cardiolyte 11/076 neg  Family History: Reviewed history from 06/22/2009 and no changes required. father died age 52 prostate cancer mother died age 68 Lou Gehrig's disease 1 brother and sister  healthy PGM: CAD unsure of maternal family history: ? goiter  Social History: Reviewed history from 06/22/2009 and no changes required. 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 CV:  Denies chest pain or discomfort. Resp:  Denies shortness of breath. GI:  Denies abdominal pain. GU:  Denies dysuria.  Physical Exam  General:  overwieght female in NAD Mouth:  Oral mucosa and oropharynx without lesions or exudates.  Teeth in good repair. Neck:  no carotid bruit or thyromegaly no cervical or supraclavicular lymphadenopathy  Lungs:  Normal respiratory effort, chest expands symmetrically. Lungs are clear to auscultation, no crackles or wheezes. Heart:  Normal rate and regular rhythm. S1 and S2 normal without gallop, murmur, click, rub or other extra sounds. Msk:  ttp B lumbar paraspinous muscles, no vertebral ttp, neg Faber's , Neg SLR. Neurologic:  No cranial nerve deficits noted. Station and gait are normal. DTRs are symmetrical throughout. Sensory, motor and coordinative functions appear intact. Skin:  Intact without suspicious lesions or rashes   Impression & Recommendations:  Problem # 1:  LUMBOSACRAL STRAIN (ICD-846.0) NSAIDs contraindicated on ASA and plavix... Treat with vicodin for pain. Info given on gentle back stretches, heat, core strenmgthening. Follow up if not improving in 2 weeks.   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 Reviewed vascular OV notes.   Complete Medication List: 1)  Adult Aspirin Ec Low Strength 81 Mg Tbec (Aspirin) .Marland Kitchen.. 1 p once daily 2)  Toprol Xl 100 Mg Tb24 (Metoprolol succinate) .... Take 1 tablet by mouth once  daily 3)  Vitamin D 1000 Unit Tabs (Cholecalciferol) .... Take 1 tablet by mouth once a day 4)  Chlorthalidone 25 Mg Tabs (Chlorthalidone) .... Take 1/2  tablet by mouth every other day 5)  Penlac 8 % Soln (Ciclopirox) .... Apply to toenails daily 6)  Lamisil At 1 % Crea (Terbinafine hcl) .... Use as directed 7)  Plavix 75 Mg Tabs (Clopidogrel bisulfate) .... Take 1 tablet by mouth once a day 8)  Vicodin 5-500 Mg Tabs (Hydrocodone-acetaminophen) .Marland Kitchen.. 1 tab by mouth by mouth q6 hours for breakthru pain  Patient Instructions: 1)  Heat, vicodin as needed pain. 2)  Gentle stretching exercsie. 3)   Call if not improving in 2 week.  Prescriptions: VICODIN 5-500 MG TABS (HYDROCODONE-ACETAMINOPHEN) 1 tab by mouth by mouth q6 hours for breakthru pain  #20 x 0   Entered and Authorized by:   Kerby Nora MD   Signed by:   Kerby Nora MD on 03/30/2010   Method used:   Print then  Give to Patient   RxID:   [id number]   Current Allergies (reviewed today): No known allergies

## 2010-12-21 NOTE — Progress Notes (Signed)
Summary: pt started lamisil  Phone Note Call from Patient Call back at Home Phone [phone] Call back at [phone]   Caller: Patient Call For: Kerby Nora MD Summary of Call: Pt called to report that she started terbenifine on monday- she wanted you to be aware. Initial call taken by: Lowella Petties CMA,  December 29, 2009 9:35 AM  Follow-up for Phone Call        Add to med list.  Follow-up by: Kerby Nora MD,  December 29, 2009 9:42 AM  Additional Follow-up for Phone Call Additional follow up Details #1::        Added to medication list. Additional Follow-up by: Linde Gillis CMA Duncan Dull),  December 29, 2009 11:44 AM    New/Updated Medications: LAMISIL AT 1 % CREA (TERBINAFINE HCL) use as directed  Prior Medications: ADULT ASPIRIN EC LOW STRENGTH 81 MG  TBEC (ASPIRIN) 1 p once daily TOPROL XL 100 MG  TB24 (METOPROLOL SUCCINATE) Take 1 tablet by mouth once daily VITAMIN D 1000 UNIT  TABS (CHOLECALCIFEROL) Take 1 tablet by mouth once a day CHLORTHALIDONE 25 MG TABS (CHLORTHALIDONE) Take 1/2  tablet by mouth every other day PENLAC 8 % SOLN (CICLOPIROX) Apply to toenails daily Current Allergies: No known allergies

## 2010-12-22 NOTE — Assessment & Plan Note (Signed)
Summary: ? UTI/ lb   Vital Signs:  Patient profile:   70 year old female Height:      62.25 inches Weight:      172.25 pounds BMI:     31.37 Temp:     97.9 degrees F oral Pulse rate:   72 / minute Pulse rhythm:   regular BP sitting:   148 / 88  (left arm) Cuff size:   regular  Vitals Entered By: Delilah Shan CMA Duncan Dull) (November 03, 2010 3:54 PM) CC: ? UTI   History of Present Illness: Dysuria, intermittent for 3 weeks.  Better with increase in fluids by mouth.  +burning with urination.  No fevers.  Occ lower abdominal pain and back pain.  H/o UTI.    Allergies: No Known Drug Allergies  Review of Systems       See HPI.  Otherwise negative.    Physical Exam  General:  GEN: nad, alert and oriented HEENT: mucous membranes moist NECK: supple CV: rrr.  PULM: ctab, no inc wob ABD: soft, +bs, suprapubic area tender EXT: no edema SKIN: no acute rash BACK: no CVA pain    Impression & Recommendations:  Problem # 1:  UTI (ICD-599.0) Start septra, culture urine and await sensitivity.  follow up as needed.  Routine instructions given.  she understood.  Her updated medication list for this problem includes:    Septra Ds 800-160 Mg Tabs (Sulfamethoxazole-trimethoprim) .Marland Kitchen... 1 by mouth two times a day  Orders: Prescription Created Electronically [id number]) T-Culture, Urine ([id number])  Complete Medication List: 1)  Adult Aspirin Ec Low Strength 81 Mg Tbec (Aspirin) .Marland Kitchen.. 1 p once daily 2)  Toprol Xl 100 Mg Tb24 (Metoprolol succinate) .... Take 1 tablet by mouth once daily 3)  Vitamin D 1000 Unit Tabs (Cholecalciferol) .... Take 1 tablet by mouth once a day 4)  Chlorthalidone 25 Mg Tabs (Chlorthalidone) .... Take 1/2  tablet by mouth every other day 5)  Penlac 8 % Soln (Ciclopirox) .... Apply to toenails daily 6)  Crestor 10 Mg Tabs (Rosuvastatin calcium) .Marland Kitchen.. 1 tab by mouth daily 7)  Cod Liver Oil Oil (Cod liver oil) .... (sometimes) 8)  Vitamin E 600 Unit Caps (Vitamin e)  .... (sometimes) 9)  Septra Ds 800-160 Mg Tabs (Sulfamethoxazole-trimethoprim) .Marland Kitchen.. 1 by mouth two times a day  Patient Instructions: 1)  Drink plenty of fluids. Cranberry juice is especially recommended in addition to large amounts of water. Avoid caffeine & carbonated drinks, they tend to irritate the bladder. Let us know in 3-5 days if you're not better: sooner if you're feeling worse.  We'll culture your urine in the meantime.  Take care. Start the antibiotics today.  Prescriptions: SEPTRA DS 800-160 MG TABS (SULFAMETHOXAZOLE-TRIMETHOPRIM) 1 by mouth two times a day  #6 x 0   Entered and Authorized by:   Crawford Givens MD   Signed by:   Crawford Givens MD on 11/03/2010   Method used:   Electronically to        [facility]  [address] [id number] (retail)       [address]       [place], [place]  [address]       Ph: [phone] or [phone]       Fax: [phone]   RxID:   [id number]    Orders Added: 1)  Est. Patient Level III [33295] 2)  Prescription Created Electronically [G8553] 3)  T-Culture, Urine [[id number]]    Current Allergies (reviewed today): No known allergies  Laboratory Results   Urine Tests  Date/Time Received: November 03, 2010 4:10 PM   Routine Urinalysis   Color: orange Appearance: Cloudy Glucose: negative   (Normal Range: Negative) Bilirubin: negative   (Normal Range: Negative) Ketone: negative   (Normal Range: Negative) Spec. Gravity: >=1.030   (Normal Range: 1.003-1.035) Blood: large   (Normal Range: Negative) pH: 6.0   (Normal Range: 5.0-8.0) Protein: negative   (Normal Range: Negative) Urobilinogen: 0.2   (Normal Range: 0-1) Nitrite: negative   (Normal Range: Negative) Leukocyte Esterace: trace   (Normal Range: Negative)

## 2011-01-04 ENCOUNTER — Telehealth: Payer: Self-pay | Admitting: Family Medicine

## 2011-01-17 NOTE — Progress Notes (Signed)
Summary: wants to try zocor   Phone Note Call from Patient Call back at [phone]   Caller: Patient Call For: Kerby Nora MD Summary of Call: Patient is currently taking crestor and it is costing her $40 every time she has it filled. She is asking if she could try zocor because she was told that she can get it for around $5 each month. [facility] [place]  Initial call taken by: Melody Comas,  January 04, 2011 9:12 AM  Follow-up for Phone Call        We cannot get to same strenght of zocor and still have it be safe for her as we can with crestor ( crestor is stronger)...we can try to make the change to see if cholesterol remains at goal, but it may not.  One other option is lipitor now taht it has gone generic since 12.2011 if above not effective.   I will send in Rx for simvastatin 40 mg daily to replace crestor 10.  Recheck fasting LIPIDS, AST, ALT  in 3 months Dx 272.0    Follow-up by: Kerby Nora MD,  January 04, 2011 9:19 AM  Additional Follow-up for Phone Call Additional follow up Details #1::        Left message for patient to return cal.Heather Humberto Leep [facility]  January 05, 2011 2:55 PM  Additional Follow-up by: Benny Lennert CMA Duncan Dull),  January 05, 2011 2:55 PM    Additional Follow-up for Phone Call Additional follow up Details #2::    Patient would like to trythe lipitor Follow-up by: Benny Lennert CMA Duncan Dull),  January 05, 2011 4:58 PM  Additional Follow-up for Phone Call Additional follow up Details #3:: Details for Additional Follow-up Action Taken: Notify patient that the below prescription has been sent to the pharmacy electronically. MAke sure pt has lipids scheduled in 3 months Dx 272.0                                            LMOM for pt to call back.           Lowella Petties CMA, AAMA  January 06, 2011 12:07 PM  Additional Follow-up by: Kerby Nora MD,  January 06, 2011 10:20 AM  New/Updated Medications: SIMVASTATIN 40 MG TABS (SIMVASTATIN) Take  1 tablet by mouth once a day Prescriptions: SIMVASTATIN 40 MG TABS (SIMVASTATIN) Take 1 tablet by mouth once a day  #30 x 11   Entered and Authorized by:   Kerby Nora MD   Signed by:   Kerby Nora MD on 01/04/2011   Method used:   [facility]        [facility]  [place] [facility]       [address]       [place], [place]  [address]       Ph: [phone] or [phone]       Fax: [phone]   RxID:   [id number]

## 2011-01-28 ENCOUNTER — Emergency Department: Payer: Self-pay | Admitting: Emergency Medicine

## 2011-02-13 ENCOUNTER — Other Ambulatory Visit: Payer: Self-pay | Admitting: Family Medicine

## 2011-02-28 ENCOUNTER — Telehealth: Payer: Self-pay | Admitting: *Deleted

## 2011-02-28 NOTE — Telephone Encounter (Signed)
Pt is asking to change from toprol XL to something less costly.  She says her copay has gone up to $45.00.  [facility] [place]  She knows you are out of the office this week.

## 2011-03-01 MED ORDER — METOPROLOL TARTRATE 100 MG PO TABS: 100.0000 mg | ORAL_TABLET | Freq: Two times a day (BID) | ORAL | Status: DC

## 2011-03-01 NOTE — Telephone Encounter (Signed)
Notify pt new prescription sent in but note that she has to take it 2 times a day.

## 2011-03-02 LAB — POCT I-STAT, CHEM 8
BUN: 14 mg/dL (ref 6–23)
Calcium, Ion: 1.17 mmol/L (ref 1.12–1.32)
Chloride: 107 mEq/L (ref 96–112)
Creatinine, Ser: 0.7 mg/dL (ref 0.4–1.2)
Glucose, Bld: 127 mg/dL — ABNORMAL HIGH (ref 70–99)
HCT: 42 % (ref 36.0–46.0)
Hemoglobin: 14.3 g/dL (ref 12.0–15.0)
Potassium: 3.7 mEq/L (ref 3.5–5.1)
Sodium: 141 mEq/L (ref 135–145)
TCO2: 24 mmol/L (ref 0–100)

## 2011-03-02 LAB — POCT CARDIAC MARKERS
CKMB, poc: 1 ng/mL (ref 1.0–8.0)
Myoglobin, poc: 57 ng/mL (ref 12–200)
Troponin i, poc: <0.05 ng/mL (ref 0.00–0.09)

## 2011-03-02 NOTE — Telephone Encounter (Signed)
Patient advised.

## 2011-04-04 NOTE — Assessment & Plan Note (Signed)
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  When the patient first came to our office, we started her  on chlorthalidone 12.5 mg daily.  This is led to quite excellent control  of her blood pressure.  Her systolic blood pressure at home has been  running between about 100 and about 140.  She has had no readings above  140.  She has actually quit taking her Norvasc for about the last week  as she said her blood pressure is getting too low on the Norvasc.  Today, her blood pressure is 138/76.  The patient has been doing well in  general.  She is active.  She exercises daily.  She does not tend to get  much dyspnea on exertion.  She may have some slight shortness of breath  if she walks up a flight of steps fast otherwise no problem.  She has  never had any chest pain.  Also of note, the patient's LDL off Crestor  has risen from 94 to 149.  Finally, the patient had an echocardiogram  showing normal LV systolic function.  No regional wall motion  abnormalities.  However, there was a suggestion of a problem with  pulmonic valve with a peak gradient across pulmonic valve 36 mmHg which  is in the range of moderate pulmonic stenosis.   PAST MEDICAL HISTORY:  1. Hypertension.  The patient has been hypertensive for 16 years.  She      had urinary catecholamines within normal limits December 2009.  She      also had a renal Doppler ultrasound that showed no evidence for      renal artery stenosis.  2. 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  This was      diagnosed by MRI in January 2009.  She is followed by Dr. Earnestine Leys of      vascular surgery here in [place].  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  4. Hyperlipidemia.  5. Echocardiogram done April 2010 showing LVEF 65%.  No regional wall      motion abnormalities.  There is normal RV size and function.      Interestingly, the pulmonic valve had an increased gradient across      with a peak gradient of about 36 mmHg which is in the range of      moderate pulmonic stenosis.  However, this was a technically      difficult study.   SOCIAL HISTORY:  The patient has never smoked.  She rarely drinks  alcohol.  She is widowed.  She is originally from [place].  She  has  relatives in [place].  She retired here in [place].   MEDICATIONS:  1. Norvasc 10 mg daily.  The patient is not taking this currently.  2. Toprol-XL 100 mg daily.  3. Aspirin 81 mg daily.  4. Fish oil every other day.  5. Chlorthalidone 12.5 mg daily.   Most recent labs April 2010, LDL 149, triglycerides 206, HDL 55,  potassium 3.6, creatinine 0.78.   PHYSICAL EXAMINATION:  VITAL SIGNS:  Blood pressure is 138/76, heart  rate is 74 and regular.  GENERAL:  This is a well-developed female in no apparent stress.  NEUROLOGICALLY:  Alert and oriented x3.  Normal affect.  LUNGS:  Clear to auscultation bilaterally with normal respiratory  effort.  NECK:  There is no JVD.  There is no thyromegaly or thyroid nodule.  CARDIOVASCULAR:  Heart regular S1 and S2.  There is a soft S4.  There is  no S3.  There is no murmur.  There are 2+ posterior tibial pulses  bilaterally.  There is no peripheral edema.  There is no carotid bruit.  ABDOMEN:  Soft, nontender.  No hepatosplenomegaly.  Normal bowel sounds.  EXTREMITIES:  No clubbing or cyanosis.   ASSESSMENT AND PLAN:  This 70 year old who presents with history  difficult to control hypertension.  1. Hypertension.  The patient's blood pressure  has been quite good on      chlorthalidone 12.5 mg daily.  She has actually stopped her      Norvasc.  Her blood pressure today is 138/76.  I told her to      continue on the chlorthalidone and check her blood pressure daily      at home.  We will call her in 2 weeks to see how her blood pressure      is doing.  If her systolic pressure is running above 140, I will      have her start back on her Norvasc again perhaps at 5 mg daily      instead of 10 mg daily.  2. Carotid fibromuscular dysplasia.  The patient does need followup      for this with vascular surgery.  She is going to call Dr. Earnestine Leys to      set up her appointment.  3. Hyperlipidemia.  The patient's cholesterol is up quite a bit off      her Crestor.  I did tell her to start back on Crestor 10 mg daily.      She will follow up with lipids and liver in 3 months.  4. Question of pulmonic stenosis.  There was suggestion of moderate      pulmonic stenosis on the echocardiogram done in April with a peak      gradient of 36 mmHg.  However, this is a poor quality echo and the      patient does not have a significant murmur in the pulmonic area on      exam.  I wonder if this was a false positive and the Doppler      reading was coming from a different valve.  I will have her go to      [facility] to get a limited echo on the echo machine there which      hopefully will give a better view of the valve and we will assess      the pulmonic valve further there.  5. I will see the patient back in the office in 3 months.  Marca Ancona, MD  Electronically Signed    DM/MedQ  DD: 03/25/2009  DT: 03/26/2009  Job #: [id number]   cc:   Kerby Nora, MD

## 2011-04-04 NOTE — Assessment & Plan Note (Signed)
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  The  patient states that she has been hypertensive for about 16 years now.  She went to the emergency department on Saturday evening because she was  feeling strange and lightheaded.  Her blood pressure there was 160/100  and she was kept for about 5 hours in the emergency department while her  blood pressure stabilized downwards.  She does tell me that her blood  pressure at home fluctuates.  Her home blood pressure checks have ranged  from 106/70 to 170/110.  She is at 160/80 today.  She takes Norvasc 10  mg daily and Toprol-XL 100 mg daily.  She is worried about the  possibility of heart disease.  The patient denies any episodes of  lightheadedness or syncope.  She does say that she feels kind of foggy  when her blood pressure rises.  She does not have any chest pain with  exertion or shortness of breath with exertion.  She walks up to an hour  on a treadmill daily without any trouble.  She can climb up several  flights of steps without any shortness of breath.  She has no orthopnea  or PND.  The patient does get an uncomfortable feeling in her epigastric  area after meals which she attributes to gastroesophageal reflux  disease.  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  This has  been an ongoing problem for her.  She was told that perhaps it is  related to the fibromuscular dysplasia in her carotid artery.   PAST MEDICAL HISTORY:  1. Hypertension.  The patient has been hypertensive for 16  years.  She      had urinary catecholamines checked that were within normal limits      in December 2009.  She also had a renal Doppler ultrasound that      showed no evidence for a renal artery stenosis.  2. 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  This was      diagnosed by MRI in January 2009.  She is followed by Dr. Earnestine Leys of      Vascular Surgery here in [place].  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  4. Hyperlipidemia.   FAMILY HISTORY:  The patient's aunt has had congestive heart failure,  mother had lugarics disease, and father had prostate cancer.  There is  no family history of premature coronary disease.   SOCIAL HISTORY:  The patient has never smoked.  She rarely drinks  alcohol.  She is widowed.  She is originally from [place].  She  has relatives in [place], she retired here in [place].  MEDICATIONS:  1. Norvasc 10 mg daily.  2. Toprol-XL 100 mg daily.  3. Aspirin 81 mg daily.  4. Fish oil every other day.   Most recent labs from November 2009 showed LDL of 94, HDL of 50, and a  creatinine of 0.7.  EKG reviewed today shows normal sinus rhythm.  This  does appear to be a normal EKG.   PHYSICAL EXAMINATION:  VITAL SIGNS:  Blood pressure 160/80, heart rate  is 60 and regular.  GENERAL:  She is a well-developed female in no apparent distress.  NEUROLOGIC:  Alert and oriented x3.  Normal affect.  LUNGS:  Clear to auscultation bilaterally with normal respiratory  effort.  NECK:  There is no JVD.  There is no thyromegaly or thyroid nodule.  HEENT:  Normal exam.  SKIN:  Normal exam.  CARDIOVASCULAR:  Heart, regular S1 and S2.  There is a soft S4, no S3.  There is no murmur.  There are 2+ posterior tibial pulses bilaterally.  There is no peripheral edema.  There is no carotid bruit.  ABDOMEN:  Soft and nontender.  No hepatosplenomegaly.  Normal bowel  sounds.  EXTREMITIES:  No clubbing or cyanosis.  MUSCULOSKELETAL:  Normal exam.  EXTREMITIES:   No clubbing or cyanosis.   ASSESSMENT AND PLAN:  This is a 70 year old who presents with a history  of difficult to control hypertension for evaluation by Cardiology.  1. Hypertension.  The patient's blood pressure does fluctuate.  On her      home cuff, it ranges from 106 to 170 systolic and 70 to 110      diastolic.  She is high today at 160/80.  She does not seem to      really get any symptoms of low blood pressure such as significant      lightheadedness.  I do think she probably needs better blood      pressure control.  She is on Norvasc 10 mg a day and Toprol-XL 100      mg a day.  I am going to add chlorthalidone 12.5 mg daily.      Chlorthalidone will be less likely than some other medications to      drop her blood pressure.  We will check her blood pressure and her      chemistries in 2 weeks to follow up her potassium on chlorthalidone      and also to follow up her blood pressure.  If her blood pressure is      still running high, we will increase her chlorthalidone to 25 mg      daily.  I suspect we may need to do this.  The patient does have      normal creatinine.  In September 2009, she had a workup for      secondary hypertension with normal urinary catecholamines and a      normal renal Doppler ultrasound.  We will do an echocardiogram to      assess for evidence of LVH or LV dysfunction.  2. Carotid fibromuscular dysplasia.  The patient does need followup of      this with Vascular Surgery here in [place], it has been more      than a year since she has had evaluation.  Her tinnitus may be      related to this.  I am going to have her call Dr. Earnestine Leys to set up a      followup appointment.  3. Hyperlipidemia.  The patient's LDL cholesterol was 94 in November      2009.  She actually had stopped her Crestor about a week before      this.  I will have repeat her cholesterol when      she comes back in to get her labs done in 2 weeks to see if it has      risen much  off of Crestor.  4. I will see the patient back in followup in the office here in a      month.     Marca Ancona, MD  Electronically Signed    DM/MedQ  DD: 02/24/2009  DT: 02/25/2009  Job #: [id number]   cc:   Kerby Nora, MD

## 2011-05-11 ENCOUNTER — Encounter: Payer: Self-pay | Admitting: Cardiovascular Disease

## 2011-06-03 ENCOUNTER — Emergency Department: Payer: Self-pay | Admitting: Emergency Medicine

## 2011-06-10 ENCOUNTER — Emergency Department (HOSPITAL_COMMUNITY)
Admission: EM | Admit: 2011-06-10 | Discharge: 2011-06-10 | Disposition: A | Discharge status: Home / Self Care | Payer: Medicare Other | Attending: Emergency Medicine | Admitting: Emergency Medicine

## 2011-06-10 ENCOUNTER — Emergency Department (HOSPITAL_COMMUNITY): Payer: Medicare Other

## 2011-06-10 DIAGNOSIS — Z79899 Other long term (current) drug therapy: Secondary | ICD-10-CM | POA: Insufficient documentation

## 2011-06-10 DIAGNOSIS — R55 Syncope and collapse: Secondary | ICD-10-CM | POA: Insufficient documentation

## 2011-06-10 DIAGNOSIS — I10 Essential (primary) hypertension: Secondary | ICD-10-CM | POA: Insufficient documentation

## 2011-06-10 DIAGNOSIS — N390 Urinary tract infection, site not specified: Secondary | ICD-10-CM | POA: Insufficient documentation

## 2011-06-10 DIAGNOSIS — IMO0001 Reserved for inherently not codable concepts without codable children: Secondary | ICD-10-CM | POA: Insufficient documentation

## 2011-06-10 LAB — URINALYSIS, ROUTINE W REFLEX MICROSCOPIC
Bilirubin Urine: NEGATIVE
Glucose, UA: NEGATIVE mg/dL
Ketones, ur: NEGATIVE mg/dL
Nitrite: POSITIVE — AB
Protein, ur: NEGATIVE mg/dL
Specific Gravity, Urine: 1.016 (ref 1.005–1.030)
Urobilinogen, UA: 0.2 mg/dL (ref 0.0–1.0)
pH: 6 (ref 5.0–8.0)

## 2011-06-10 LAB — GLUCOSE, CAPILLARY: Glucose-Capillary: 100 mg/dL — ABNORMAL HIGH (ref 70–99)

## 2011-06-10 LAB — POCT I-STAT, CHEM 8
BUN: 11 mg/dL (ref 6–23)
Calcium, Ion: 1.23 mmol/L (ref 1.12–1.32)
Chloride: 106 mEq/L (ref 96–112)
Creatinine, Ser: 0.6 mg/dL (ref 0.50–1.10)
Glucose, Bld: 114 mg/dL — ABNORMAL HIGH (ref 70–99)
HCT: 41 % (ref 36.0–46.0)
Hemoglobin: 13.9 g/dL (ref 12.0–15.0)
Potassium: 4.1 mEq/L (ref 3.5–5.1)
Sodium: 141 mEq/L (ref 135–145)
TCO2: 24 mmol/L (ref 0–100)

## 2011-06-10 LAB — URINE MICROSCOPIC-ADD ON

## 2011-06-10 LAB — TROPONIN I: Troponin I: <0.3 ng/mL (ref <0.30)

## 2011-06-13 LAB — URINE CULTURE
Colony Count: >=100000
Culture  Setup Time: 201207221122

## 2011-06-23 ENCOUNTER — Telehealth: Payer: Self-pay | Admitting: *Deleted

## 2011-06-23 NOTE — Telephone Encounter (Signed)
Pt needs rx for [facility]orthalidone [facility] [address] [place]

## 2011-06-26 ENCOUNTER — Other Ambulatory Visit: Payer: Self-pay | Admitting: *Deleted

## 2011-06-26 MED ORDER — CHLORTHALIDONE 25 MG PO TABS: 12.5000 mg | ORAL_TABLET | ORAL | Status: DC

## 2011-06-26 NOTE — Telephone Encounter (Signed)
Ok to refill, #8, 0 refills

## 2011-06-27 ENCOUNTER — Ambulatory Visit: Payer: Self-pay | Admitting: Vascular Surgery

## 2011-06-28 NOTE — Telephone Encounter (Signed)
Spoke with the patient regarding chlorthalidone.  Told patient we can't refill the medication until she makes an appointment to see Dr.Gollan or Dr. Shirlee Latch & have some blood work. The patient will see Dr. Ermalene Searing next week and will discuss this refill with her.

## 2011-07-06 ENCOUNTER — Ambulatory Visit (INDEPENDENT_AMBULATORY_CARE_PROVIDER_SITE_OTHER): Payer: Medicare Other | Admitting: Family Medicine

## 2011-07-06 ENCOUNTER — Encounter: Payer: Self-pay | Admitting: Family Medicine

## 2011-07-06 DIAGNOSIS — E0590 Thyrotoxicosis, unspecified without thyrotoxic crisis or storm: Secondary | ICD-10-CM

## 2011-07-06 DIAGNOSIS — I7789 Other specified disorders of arteries and arterioles: Secondary | ICD-10-CM

## 2011-07-06 DIAGNOSIS — R42 Dizziness and giddiness: Secondary | ICD-10-CM | POA: Insufficient documentation

## 2011-07-06 DIAGNOSIS — I10 Essential (primary) hypertension: Secondary | ICD-10-CM

## 2011-07-06 DIAGNOSIS — R55 Syncope and collapse: Secondary | ICD-10-CM

## 2011-07-06 NOTE — Assessment & Plan Note (Signed)
Stable per Dr. Earnestine Leys.

## 2011-07-06 NOTE — Patient Instructions (Addendum)
Do not go more than 5 hours without eating. Having 3 meals a day with healthy snack in between. With each meal.. Make sure there is protein or fiber.  Return in next few days for fasting labs. We will call with lab results.

## 2011-07-06 NOTE — Assessment & Plan Note (Signed)
Unclear cause... Will eval further with labs. May be due to irregualr eating habits, skipping meals and hypoglycemia. CBG at hospital was 114 but this was after large glass of OJ.  ENT eval negative.. Does not sound like vertigo. Neuro exam nml and imaging nml, but if labs return nml consider neuro referral. Also consider if anxiety source of "woozy feeling"

## 2011-07-06 NOTE — Assessment & Plan Note (Signed)
Likely vagal syncope after BM straining. May also be connected with ongoing intermittant lightheaded spells. ? Hypoglycemia.  Nml EKG and no associated cardiac symptoms.

## 2011-07-06 NOTE — Progress Notes (Signed)
Subjective:    Patient ID: Kristy [name], female    DOB: 1941/01/26, 70 y.o.   MRN: [id number]  HPI  ER visit on 7/21 for syncopal event after having a bowel movement, mild straining. As she went to the door she felt lightheaded.. Woke up on floor.  no associated chest pain, no shortness of breath.  BP elevated at 165/95 ( she does have anxiety associated elevated BP)  Nml BMET,Nml cbc  EKG stable CT Head: Nml. Abnormal UA.. Diagnosed with UTI and started on Keflex 500 mg x  She was having some odor.  Has history of fibromuscular dysplasia of carotid. She is s/p CEA last year.  She has followed up with vascular MD, Dr. Earnestine Leys... Reevaluated with dopplers, they looked stable.  She has continued to have intermittant dizzy spells, these have been occuring over the past year... Feels woozy, only mildly elevated BP at these time. She does not have any other associated issues. She does not feel more anxious. I do feel like I am in a brain fog.  She does note some increase in woozy feeling if not eating.  Dr. Earnestine Leys evaluated then with MRI of neck.. No blockage in  Carotid arteries per pt. Also had brain MRI.. Neg per pt.  Her blood pressure at home has been running well (128/79 with no meds since tues).. So only taking chlorthalidone and toprol every few day. She feels BP mainly associated with her anxiety/stress.  Saw ENT in last few weeks.. Dr. Jenne Campus.. Told she does not have vertigo.  Mother with Nilsa Nutting disease.   Review of Systems  Constitutional: Negative for fever and fatigue.  HENT: Negative for ear pain.   Eyes: Negative for pain.  Respiratory: Negative for chest tightness and shortness of breath.   Cardiovascular: Negative for chest pain, palpitations and leg swelling.  Gastrointestinal: Negative for abdominal pain.  Genitourinary: Negative for dysuria.  Neurological: Positive for dizziness, syncope and light-headedness. Negative for seizures, facial asymmetry,  speech difficulty, weakness, numbness and headaches.       Objective:   Physical Exam  Constitutional: Vital signs are normal. She appears well-developed and well-nourished. She is cooperative.  Non-toxic appearance. She does not appear ill. No distress.  HENT:  Head: Normocephalic.  Right Ear: Hearing, tympanic membrane, external ear and ear canal normal. Tympanic membrane is not erythematous, not retracted and not bulging.  Left Ear: Hearing, tympanic membrane, external ear and ear canal normal. Tympanic membrane is not erythematous, not retracted and not bulging.  Nose: No mucosal edema or rhinorrhea. Right sinus exhibits no maxillary sinus tenderness and no frontal sinus tenderness. Left sinus exhibits no maxillary sinus tenderness and no frontal sinus tenderness.  Mouth/Throat: Uvula is midline, oropharynx is clear and moist and mucous membranes are normal.  Eyes: Conjunctivae, EOM and lids are normal. Pupils are equal, round, and reactive to light. No foreign bodies found.  Neck: Trachea normal and normal range of motion. Neck supple. Carotid bruit is not present. No mass and no thyromegaly present.  Cardiovascular: Normal rate, regular rhythm, S1 normal, S2 normal, normal heart sounds, intact distal pulses and normal pulses.  Exam reveals no gallop and no friction rub.   No murmur heard. Pulmonary/Chest: Effort normal and breath sounds normal. Not tachypneic. No respiratory distress. She has no decreased breath sounds. She has no wheezes. She has no rhonchi. She has no rales.  Abdominal: Soft. Normal appearance and bowel sounds are normal. There is no tenderness.  Neurological: She is alert.  She has normal strength and normal reflexes. No cranial nerve deficit or sensory deficit. She exhibits normal muscle tone. She displays a negative Romberg sign. Coordination and gait normal.  Skin: Skin is warm, dry and intact. No rash noted.  Psychiatric: Her speech is normal and behavior is normal.  Judgment and thought content normal. Her mood appears not anxious. Cognition and memory are normal. She does not exhibit a depressed mood.          Assessment & Plan:

## 2011-07-06 NOTE — Assessment & Plan Note (Signed)
Well controlled. Using medication intermittantly because when she takes regularly she has SE.  HTN is very related to anxiety/stress.. If at home or on weekends, BPs are in nml range.

## 2011-07-10 ENCOUNTER — Other Ambulatory Visit (INDEPENDENT_AMBULATORY_CARE_PROVIDER_SITE_OTHER): Payer: Medicare Other | Admitting: Family Medicine

## 2011-07-10 DIAGNOSIS — E0590 Thyrotoxicosis, unspecified without thyrotoxic crisis or storm: Secondary | ICD-10-CM

## 2011-07-10 DIAGNOSIS — I10 Essential (primary) hypertension: Secondary | ICD-10-CM

## 2011-07-10 DIAGNOSIS — R42 Dizziness and giddiness: Secondary | ICD-10-CM

## 2011-07-10 DIAGNOSIS — Z79899 Other long term (current) drug therapy: Secondary | ICD-10-CM

## 2011-07-10 LAB — COMPREHENSIVE METABOLIC PANEL
ALT: 17 U/L (ref 0–35)
AST: 18 U/L (ref 0–37)
Albumin: 4.4 g/dL (ref 3.5–5.2)
Alkaline Phosphatase: 60 U/L (ref 39–117)
BUN: 15 mg/dL (ref 6–23)
CO2: 25 mEq/L (ref 19–32)
Calcium: 10 mg/dL (ref 8.4–10.5)
Chloride: 103 mEq/L (ref 96–112)
Creatinine, Ser: 0.9 mg/dL (ref 0.4–1.2)
GFR: 82.15 mL/min (ref >60.00)
Glucose, Bld: 111 mg/dL — ABNORMAL HIGH (ref 70–99)
Potassium: 3.7 mEq/L (ref 3.5–5.1)
Sodium: 140 mEq/L (ref 135–145)
Total Bilirubin: 0.6 mg/dL (ref 0.3–1.2)
Total Protein: 7.7 g/dL (ref 6.0–8.3)

## 2011-07-10 LAB — TSH: TSH: 1.82 u[IU]/mL (ref 0.35–5.50)

## 2011-07-10 LAB — LIPID PANEL
Cholesterol: 233 mg/dL — ABNORMAL HIGH (ref 0–200)
HDL: 55.7 mg/dL (ref >39.00)
Total CHOL/HDL Ratio: 4
Triglycerides: 101 mg/dL (ref 0.0–149.0)
VLDL: 20.2 mg/dL (ref 0.0–40.0)

## 2011-07-10 LAB — LDL CHOLESTEROL, DIRECT: Direct LDL: 169 mg/dL

## 2011-07-10 LAB — VITAMIN B12: Vitamin B-12: 282 pg/mL (ref 211–911)

## 2011-08-01 ENCOUNTER — Other Ambulatory Visit: Payer: Self-pay | Admitting: Family Medicine

## 2011-08-01 DIAGNOSIS — Z1231 Encounter for screening mammogram for malignant neoplasm of breast: Secondary | ICD-10-CM

## 2011-08-19 ENCOUNTER — Other Ambulatory Visit: Payer: Self-pay | Admitting: Family Medicine

## 2011-09-25 ENCOUNTER — Ambulatory Visit (INDEPENDENT_AMBULATORY_CARE_PROVIDER_SITE_OTHER)
Admission: RE | Admit: 2011-09-25 | Discharge: 2011-09-25 | Disposition: A | Discharge status: Home / Self Care | Payer: Medicare Other | Source: Ambulatory Visit | Attending: Family Medicine | Admitting: Family Medicine

## 2011-09-25 ENCOUNTER — Encounter: Payer: Self-pay | Admitting: Family Medicine

## 2011-09-25 ENCOUNTER — Ambulatory Visit (INDEPENDENT_AMBULATORY_CARE_PROVIDER_SITE_OTHER): Payer: Medicare Other | Admitting: Family Medicine

## 2011-09-25 VITALS — BP 120/70 | HR 62 | Temp 97.6°F | Ht 63.0 in | Wt 169.8 lb

## 2011-09-25 DIAGNOSIS — M25561 Pain in right knee: Secondary | ICD-10-CM

## 2011-09-25 DIAGNOSIS — M25569 Pain in unspecified knee: Secondary | ICD-10-CM

## 2011-09-25 MED ORDER — DICLOFENAC SODIUM 75 MG PO TBEC: 75.0000 mg | DELAYED_RELEASE_TABLET | Freq: Two times a day (BID) | ORAL | Status: DC

## 2011-09-25 MED ORDER — TRAMADOL HCL 50 MG PO TABS: 50.0000 mg | ORAL_TABLET | Freq: Four times a day (QID) | ORAL | Status: AC | PRN

## 2011-09-25 NOTE — Patient Instructions (Signed)
Recheck knee in 3 weeks

## 2011-09-25 NOTE — Progress Notes (Signed)
  Subjective:    Patient ID: Kristy [name], female    DOB: June 17, 1941, 70 y.o.   MRN: [id number]  HPI  70 year old female:  Went up to church yesterday and leg gave, hurt and felt a pop and acute pain. Pain got worse, When trying to bend down will hurt a lot. A little bit better today. When walking down the stairs had some pain. Her pain is improved today compared to yesterday. She has not had any locking up of her joint. She has had some occasional giving way, hurts more with standing up and going up and downstairs. No prior significant knee injuries, and no prior traumatic fractures or operations.  The PMH, PSH, Social History, Family History, Medications, and allergies have been reviewed in [facility], and have been updated if relevant.   Review of Systems REVIEW OF SYSTEMS  GEN: No fevers, chills. Nontoxic. Primarily MSK c/o today. MSK: Detailed in the HPI GI: tolerating PO intake without difficulty Neuro: No numbness, parasthesias, or tingling associated. Otherwise the pertinent positives of the ROS are noted above.      Objective:   Physical Exam   Physical Exam  Blood pressure 120/70, pulse 62, temperature 97.6 F (36.4 C), temperature source Oral, height 5\' 3"  (1.6 m), weight 169 lb 12.8 oz (77.021 kg), SpO2 100.00%.  GEN: WDWN, NAD, Non-toxic, A & O x 3 HEENT: Atraumatic, Normocephalic. Neck supple. No masses, No LAD. Ears and Nose: No external deformity. EXTR: No c/c/e NEURO Normal gait.  PSYCH: Normally interactive. Conversant. Not depressed or anxious appearing.  Calm demeanor.   Right knee: Full extension, and flexion to 95. Large ballotable effusion. Nontender with patellar motion. Medial joint line tenderness. No tenderness with the lateral joint line. Negative bowel sounds chest. There is a posterior, popliteal fullness as well. Negative Lachman. Stable MCL and LCL. All other meniscal testing is equivocal      Assessment & Plan:   1. Right knee pain  DG Knee  Complete 4 Views Right, diclofenac (VOLTAREN) 75 MG EC tablet, traMADol (ULTRAM) 50 MG tablet    X-rays of the right knee, trauma series, show some mild arthritis, but otherwise there is no evidence of occult fracture, dislocation, or radiopaque matter in the knee  The patient is improved after one day. Think it is most reasonable at this point to continue with anti-inflammatory scheduled over the next 2-3 weeks, and tramadol as needed for pain. I'll recheck her knee in 3 weeks. Clinical, there is a concern for potential meniscal tear given her history and exam.

## 2011-10-02 ENCOUNTER — Ambulatory Visit
Admission: RE | Admit: 2011-10-02 | Discharge: 2011-10-02 | Disposition: A | Discharge status: Home / Self Care | Payer: Medicare Other | Source: Ambulatory Visit | Attending: Family Medicine | Admitting: Family Medicine

## 2011-10-02 DIAGNOSIS — Z1231 Encounter for screening mammogram for malignant neoplasm of breast: Secondary | ICD-10-CM

## 2011-10-03 ENCOUNTER — Ambulatory Visit (INDEPENDENT_AMBULATORY_CARE_PROVIDER_SITE_OTHER): Payer: Medicare Other

## 2011-10-03 DIAGNOSIS — Z23 Encounter for immunization: Secondary | ICD-10-CM

## 2011-10-16 ENCOUNTER — Ambulatory Visit: Payer: Medicare Other | Admitting: Family Medicine

## 2011-10-17 ENCOUNTER — Encounter: Payer: Self-pay | Admitting: Family Medicine

## 2011-10-17 ENCOUNTER — Ambulatory Visit (INDEPENDENT_AMBULATORY_CARE_PROVIDER_SITE_OTHER): Payer: Medicare Other | Admitting: Family Medicine

## 2011-10-17 VITALS — BP 130/82 | HR 80 | Temp 98.5°F | Ht 63.0 in | Wt 167.1 lb

## 2011-10-17 DIAGNOSIS — J209 Acute bronchitis, unspecified: Secondary | ICD-10-CM

## 2011-10-17 MED ORDER — HYDROCODONE-HOMATROPINE 5-1.5 MG/5ML PO SYRP: ORAL_SOLUTION | ORAL | Status: AC

## 2011-10-17 MED ORDER — AZITHROMYCIN 250 MG PO TABS: ORAL_TABLET | ORAL | Status: AC

## 2011-10-17 NOTE — Progress Notes (Signed)
  Patient Name: Kristy [name] Date of Birth: 1940-12-05 Age: 70 y.o. Medical Record Number: [id number] Gender: female  History of Present Illness:  Kristy [name] is a 70 y.o. very pleasant female patient who presents with the following:  A lot of cough and congestion. Bringing up some significant mucous. Sick since Sat.  101 Bronchitis  Acute Bronchitis: Patient presents for presents evaluation of chills, dyspnea, fever, nasal congestion, productive cough, sore throat and wheezing. Symptoms began 7 days ago and are gradually worsening since that time.  Past history is significant for occasional episodes of bronchitis.   Past Medical History, Surgical History, Social History, Family History, and Problem List have been reviewed in EHR and updated if relevant.  Review of Systems: ROS: GEN: Acute illness details above GI: Tolerating PO intake GU: maintaining adequate hydration and urination Pulm: No SOB Interactive and getting along well at home.  Otherwise, ROS is as per the HPI.   Physical Examination: Filed Vitals:   10/17/11 0949  BP: 130/82  Pulse: 80  Temp: 98.5 F (36.9 C)  TempSrc: Oral  Height: 5\' 3"  (1.6 m)  Weight: 167 lb 1.9 oz (75.805 kg)  SpO2: 95%     GEN: A and O x 3. WDWN. NAD.    ENT: Nose clear, ext NML.  No LAD.  No JVD.  TM's clear. Oropharynx clear.  PULM: Normal WOB, no distress. No crackles, wheezes, rhonchi. CV: RRR, no M/G/R, No rubs, No JVD.   EXT: warm and well-perfused, No c/c/e. PSYCH: Pleasant and conversant.   Assessment and Plan: Acute bronchitis: discussed plan of care. Given length of symptoms and overall history, will treat with ABX in this case. Continue with additional supportive care, cough medications, liquids, sleep, steam / vaporizer.

## 2011-10-19 ENCOUNTER — Ambulatory Visit: Payer: Medicare Other | Admitting: Family Medicine

## 2011-12-04 ENCOUNTER — Ambulatory Visit: Payer: Medicare Other | Admitting: Family Medicine

## 2011-12-04 ENCOUNTER — Encounter: Payer: Self-pay | Admitting: Family Medicine

## 2011-12-04 ENCOUNTER — Ambulatory Visit (INDEPENDENT_AMBULATORY_CARE_PROVIDER_SITE_OTHER): Payer: Medicare Other | Admitting: Family Medicine

## 2011-12-04 VITALS — BP 120/78 | HR 61 | Temp 98.1°F | Ht 63.0 in | Wt 171.0 lb

## 2011-12-04 DIAGNOSIS — L02419 Cutaneous abscess of limb, unspecified: Secondary | ICD-10-CM

## 2011-12-04 DIAGNOSIS — IMO0002 Reserved for concepts with insufficient information to code with codable children: Secondary | ICD-10-CM

## 2011-12-04 DIAGNOSIS — M25561 Pain in right knee: Secondary | ICD-10-CM

## 2011-12-04 DIAGNOSIS — M25569 Pain in unspecified knee: Secondary | ICD-10-CM

## 2011-12-04 MED ORDER — DOXYCYCLINE HYCLATE 100 MG PO TABS: 100.0000 mg | ORAL_TABLET | Freq: Two times a day (BID) | ORAL | Status: AC

## 2011-12-04 NOTE — Patient Instructions (Signed)
Recheck with me on Thursday   Incision and Drainage of Abscess An abscess (boil or furuncle) is an area infected by germs that contains a collection of pus. Signs and problems (symptoms) of an abscess include pain, tenderness, redness, or hardness. You may feel a moveable, soft area under your skin. An abscess can occur anywhere in the body. Occasionally, this may spread to surrounding tissues causing cellulitis. Sometimes, a surgeon may make a cut (incision) over your abscess. The pus is drained. Gauze may be packed into the space to provide a drain. Keeping a drain or piece of gauze in the incision keeps the skin from healing first. This helps stop the abscess from forming again. The area may be painful for 5 to 7 days. Most people with an abscess do not have high fevers. If seen early, your abscess may not have localized and may not be cut. If it does not get better on its own or with medicines, you may require another appointment. HOME CARE INSTRUCTIONS   Only take over-the-counter or prescription medicines for pain, discomfort, or fever as directed by your caregiver. Use these only if your caregiver has not given medicines that would interfere.   When you bathe, remove the gauze drain after soaking. You may then wash the wound gently with mild, soapy water. Repack with gauze as your caregiver directs.   See your caregiver as directed for a recheck.   If antibiotics were prescribed, take them as directed.  SEEK MEDICAL CARE IF:   You develop increased pain, swelling, redness, drainage, or bleeding in the wound site.   You develop signs of generalized infection, including muscle aches, chills, or a general ill feeling.   You or your child has an oral temperature above 102 F (38.9 C).  MAKE SURE YOU:   Understand these instructions.   Will watch your condition.   Will get help right away if you are not doing well or get worse.  Document Released: 05/02/2001 Document Revised:  07/19/2011 Document Reviewed: 06/26/2008 [facility] Patient Information 2012 [place], [place].

## 2011-12-04 NOTE — Progress Notes (Signed)
Addended by: Alvina Chou on: 12/04/2011 04:42 PM   Modules accepted: Orders

## 2011-12-04 NOTE — Progress Notes (Signed)
  Patient Name: Kristy [name] Date of Birth: [birth year] Age: 71 y.o. Medical Record Number: [id number] Gender: female Date of Encounter: 12/04/2011  History of Present Illness:  Kristy [name] is a 71 y.o. very pleasant female patient who presents with the following:  F/u R knee, giving way: R knee still with effusion, pain, occ buckling with walking and restriction of motion with flexion  Boil? Abscess R shoulder - has been worsening over the last week, now enlarged to > a half dollar in size, painful to touch.   09/24/2012 OV: Went up to church yesterday and leg gave, hurt and felt a pop and acute pain. Pain got worse, When trying to bend down will hurt a lot. A little bit better today. When walking down the stairs had some pain. Her pain is improved today compared to yesterday. She has not had any locking up of her joint. She has had some occasional giving way, hurts more with standing up and going up and downstairs. No prior significant knee injuries, and no prior traumatic fractures or operations.  Past Medical History, Surgical History, Social History, Family History, Problem List, Medications, and Allergies have been reviewed and updated if relevant.  Review of Systems: ROS: GEN: Acute illness details above GI: Tolerating PO intake GU: maintaining adequate hydration and urination Pulm: No SOB Interactive and getting along well at home.  Otherwise, ROS is as per the HPI.   Physical Examination: Filed Vitals:   12/04/11 1159  BP: 120/78  Pulse: 61  Temp: 98.1 F (36.7 C)  TempSrc: Oral  Height: 5\' 3"  (1.6 m)  Weight: 171 lb (77.565 kg)  SpO2: 100%    Body mass index is 30.29 kg/(m^2).   GEN: WDWN, NAD, Non-toxic, Alert & Oriented x 3 HEENT: Atraumatic, Normocephalic.  Ears and Nose: No external deformity. EXTR: No clubbing/cyanosis/edema NEURO: Normal gait.  PSYCH: Normally interactive. Conversant. Not depressed or anxious appearing.  Calm demeanor.    Knee:  R Gait: Normal heel toe pattern ROM: 0 - 110 Effusion: moderate Echymosis or edema: none Patellar tendon NT Painful PLICA: neg Patellar grind: negative Medial and lateral patellar facet loading: negative medial and lateral joint lines: mild medial and lateral Mcmurray's pain Flexion-pinch positive Varus and valgus stress: stable Lachman: neg Ant and Post drawer: neg Hip abduction, IR, ER: WNL Hip flexion str: 5/5 Hip abd: 5/5 Quad: 5/5 VMO atrophy: mild Hamstring concentric and eccentric: 5/5   Skin: R shoulder approx 3 cm across, area of induration with central fluctuance  Assessment and Plan:   1. Knee pain, right  MR Knee Right Wo Contrast  2. Abscess of shoulder      MRI right knee, failure to improve with conservative measures > 1 mo, approaching 2 with effusion, + mcmurrays for pain and flexion pinch.   Abscess, R shoulder: doxy and i and d  I&D Indication: suspect abscess Pt complaints of: erythema, pain, swelling Location: R shoulder Size: 3 cm across Verbal informed consent obtained.  Pt aware of risks not limited to but including infection, bleeding, damage to near by organs. Prep: etoh/betadine Anesthesia: 1%lidocaine with epi, good effect Incision made with #11 blade Would explored and loculations removed Amount expressed: 3 cc Wound packed with iodoform gauze Tolerated well Routine postprocedure instructions d/w pt- remove packing in 24-48h, keep area clean and bandaged, follow up if concerns/spreading erythema/pain.

## 2011-12-07 ENCOUNTER — Encounter: Payer: Self-pay | Admitting: Family Medicine

## 2011-12-07 ENCOUNTER — Ambulatory Visit (INDEPENDENT_AMBULATORY_CARE_PROVIDER_SITE_OTHER): Payer: Medicare Other | Admitting: Family Medicine

## 2011-12-07 VITALS — BP 130/88 | HR 80 | Temp 98.0°F | Ht 63.0 in | Wt 169.1 lb

## 2011-12-07 DIAGNOSIS — IMO0002 Reserved for concepts with insufficient information to code with codable children: Secondary | ICD-10-CM

## 2011-12-07 DIAGNOSIS — L02419 Cutaneous abscess of limb, unspecified: Secondary | ICD-10-CM

## 2011-12-07 LAB — WOUND CULTURE
Gram Stain: NONE SEEN
Organism ID, Bacteria: NO GROWTH

## 2011-12-07 NOTE — Progress Notes (Signed)
Surgical wound check  F/u I and D: Wound clean with good granulation tissue. No surrounding redness. Much less painful.  Doxy causing upset stomache -- advised to take with meals, call if cannot tolerate.

## 2011-12-09 ENCOUNTER — Ambulatory Visit
Admission: RE | Admit: 2011-12-09 | Discharge: 2011-12-09 | Disposition: A | Discharge status: Home / Self Care | Payer: Medicare Other | Source: Ambulatory Visit | Attending: Family Medicine | Admitting: Family Medicine

## 2011-12-09 DIAGNOSIS — M25561 Pain in right knee: Secondary | ICD-10-CM

## 2011-12-11 ENCOUNTER — Other Ambulatory Visit: Payer: Self-pay | Admitting: Family Medicine

## 2011-12-11 DIAGNOSIS — S83206A Unspecified tear of unspecified meniscus, current injury, right knee, initial encounter: Secondary | ICD-10-CM

## 2012-03-25 ENCOUNTER — Other Ambulatory Visit: Payer: Self-pay | Admitting: Family Medicine

## 2012-03-26 ENCOUNTER — Encounter: Payer: Self-pay | Admitting: Family Medicine

## 2012-03-26 ENCOUNTER — Ambulatory Visit (INDEPENDENT_AMBULATORY_CARE_PROVIDER_SITE_OTHER): Payer: Medicare Other | Admitting: Family Medicine

## 2012-03-26 VITALS — BP 120/74 | HR 60 | Temp 98.4°F | Ht 63.0 in | Wt 158.8 lb

## 2012-03-26 DIAGNOSIS — R7309 Other abnormal glucose: Secondary | ICD-10-CM

## 2012-03-26 DIAGNOSIS — T679XXA Effect of heat and light, unspecified, initial encounter: Secondary | ICD-10-CM

## 2012-03-26 DIAGNOSIS — E0590 Thyrotoxicosis, unspecified without thyrotoxic crisis or storm: Secondary | ICD-10-CM

## 2012-03-26 DIAGNOSIS — R6889 Other general symptoms and signs: Secondary | ICD-10-CM

## 2012-03-26 DIAGNOSIS — I10 Essential (primary) hypertension: Secondary | ICD-10-CM

## 2012-03-26 DIAGNOSIS — M949 Disorder of cartilage, unspecified: Secondary | ICD-10-CM

## 2012-03-26 DIAGNOSIS — M899 Disorder of bone, unspecified: Secondary | ICD-10-CM

## 2012-03-26 DIAGNOSIS — N898 Other specified noninflammatory disorders of vagina: Secondary | ICD-10-CM

## 2012-03-26 DIAGNOSIS — E785 Hyperlipidemia, unspecified: Secondary | ICD-10-CM

## 2012-03-26 NOTE — Progress Notes (Signed)
  Subjective:    Patient ID: Kristy [name], female    DOB: 13-Aug-1941, 71 y.o.   MRN: [id number]  HPI 71 year old female here for follow up.  Vaginal discharge off and on few weeks ago, resolved now. Mild  Itching.  Hypertension:  Well controlled Using medication without problems or lightheadedness:  Chest pain with exertion: None Edema:None Short of breath:None Average home BPs: Still some fluctuations at home, but better with weight loss. Other issues:  Elevated Cholesterol: Due for re-eval On no medication Diet compliance: good Exercise:good, 2-3 times a week Other complaints:  Due for CPX.   Review of Systems  Constitutional: Positive for diaphoresis. Negative for fever and fatigue.       Temperature fluctuations at night, hot and cold.  Has lost 22 lbs in last year, intentionally.  HENT: Negative for ear pain.   Eyes: Negative for pain.  Respiratory: Negative for cough and shortness of breath.   Cardiovascular: Negative for chest pain, palpitations and leg swelling.  Gastrointestinal: Negative for constipation and abdominal distention.  Genitourinary: Positive for frequency. Negative for dysuria, flank pain and pelvic pain.  Musculoskeletal:       Recent knee surgery.  Skin:       No hair loss  Psychiatric/Behavioral: Negative for dysphoric mood.       Objective:   Physical Exam  Constitutional: Vital signs are normal. She appears well-developed and well-nourished. She is cooperative.  Non-toxic appearance. She does not appear ill. No distress.  HENT:  Head: Normocephalic.  Right Ear: Hearing, tympanic membrane, external ear and ear canal normal. Tympanic membrane is not erythematous, not retracted and not bulging.  Left Ear: Hearing, tympanic membrane, external ear and ear canal normal. Tympanic membrane is not erythematous, not retracted and not bulging.  Nose: No mucosal edema or rhinorrhea. Right sinus exhibits no maxillary sinus tenderness and no frontal  sinus tenderness. Left sinus exhibits no maxillary sinus tenderness and no frontal sinus tenderness.  Mouth/Throat: Uvula is midline, oropharynx is clear and moist and mucous membranes are normal.  Eyes: Conjunctivae, EOM and lids are normal. Pupils are equal, round, and reactive to light. No foreign bodies found.  Neck: Trachea normal and normal range of motion. Neck supple. Carotid bruit is not present. No mass and no thyromegaly present.  Cardiovascular: Normal rate, regular rhythm, S1 normal, S2 normal, normal heart sounds, intact distal pulses and normal pulses.  Exam reveals no gallop and no friction rub.   No murmur heard. Pulmonary/Chest: Effort normal and breath sounds normal. Not tachypneic. No respiratory distress. She has no decreased breath sounds. She has no wheezes. She has no rhonchi. She has no rales.  Abdominal: Soft. Normal appearance and bowel sounds are normal. There is no tenderness.  Neurological: She is alert.  Skin: Skin is warm, dry and intact. No rash noted.  Psychiatric: Her speech is normal and behavior is normal. Judgment and thought content normal. Her mood appears not anxious. Cognition and memory are normal. She does not exhibit a depressed mood.          Assessment & Plan:

## 2012-03-26 NOTE — Assessment & Plan Note (Signed)
Re-eval vit D levels

## 2012-03-26 NOTE — Assessment & Plan Note (Signed)
Well controlled. Continue current medication.  

## 2012-03-26 NOTE — Assessment & Plan Note (Signed)
Due for re-eval. Continue lifestyle changes

## 2012-03-26 NOTE — Assessment & Plan Note (Signed)
Due for re-eval. 

## 2012-03-26 NOTE — Patient Instructions (Addendum)
Return for fasting labs. We will call you with results. Great work on exercise and weight loss.

## 2012-03-26 NOTE — Assessment & Plan Note (Signed)
Resolved now. If returns she will come in/we will try to add her on for wet prep.

## 2012-03-26 NOTE — Assessment & Plan Note (Signed)
Will re-eval given temperature fluctuations.

## 2012-03-27 ENCOUNTER — Other Ambulatory Visit: Payer: Medicare Other

## 2012-03-28 ENCOUNTER — Other Ambulatory Visit (INDEPENDENT_AMBULATORY_CARE_PROVIDER_SITE_OTHER): Payer: Medicare Other

## 2012-03-28 DIAGNOSIS — R6889 Other general symptoms and signs: Secondary | ICD-10-CM

## 2012-03-28 DIAGNOSIS — E0590 Thyrotoxicosis, unspecified without thyrotoxic crisis or storm: Secondary | ICD-10-CM

## 2012-03-28 DIAGNOSIS — I10 Essential (primary) hypertension: Secondary | ICD-10-CM

## 2012-03-28 DIAGNOSIS — M899 Disorder of bone, unspecified: Secondary | ICD-10-CM

## 2012-03-28 DIAGNOSIS — E785 Hyperlipidemia, unspecified: Secondary | ICD-10-CM

## 2012-03-28 LAB — COMPREHENSIVE METABOLIC PANEL
ALT: 16 U/L (ref 0–35)
AST: 18 U/L (ref 0–37)
Albumin: 3.9 g/dL (ref 3.5–5.2)
Alkaline Phosphatase: 58 U/L (ref 39–117)
BUN: 12 mg/dL (ref 6–23)
CO2: 27 mEq/L (ref 19–32)
Calcium: 9.4 mg/dL (ref 8.4–10.5)
Chloride: 106 mEq/L (ref 96–112)
Creatinine, Ser: 0.7 mg/dL (ref 0.4–1.2)
GFR: 106.76 mL/min (ref >60.00)
Glucose, Bld: 93 mg/dL (ref 70–99)
Potassium: 4 mEq/L (ref 3.5–5.1)
Sodium: 142 mEq/L (ref 135–145)
Total Bilirubin: 0.5 mg/dL (ref 0.3–1.2)
Total Protein: 7.1 g/dL (ref 6.0–8.3)

## 2012-03-28 LAB — LIPID PANEL
Cholesterol: 204 mg/dL — ABNORMAL HIGH (ref 0–200)
HDL: 53 mg/dL (ref >39.00)
Total CHOL/HDL Ratio: 4
Triglycerides: 137 mg/dL (ref 0.0–149.0)
VLDL: 27.4 mg/dL (ref 0.0–40.0)

## 2012-03-28 LAB — LDL CHOLESTEROL, DIRECT: Direct LDL: 142.8 mg/dL

## 2012-03-28 LAB — TSH: TSH: 2.29 u[IU]/mL (ref 0.35–5.50)

## 2012-03-29 LAB — VITAMIN D 25 HYDROXY (VIT D DEFICIENCY, FRACTURES): Vit D, 25-Hydroxy: 35 ng/mL (ref 30–89)

## 2012-06-20 ENCOUNTER — Ambulatory Visit: Payer: Self-pay | Admitting: Orthopedic Surgery

## 2012-07-02 ENCOUNTER — Ambulatory Visit (INDEPENDENT_AMBULATORY_CARE_PROVIDER_SITE_OTHER): Payer: Medicare Other | Admitting: Family Medicine

## 2012-07-02 ENCOUNTER — Encounter: Payer: Self-pay | Admitting: Internal Medicine

## 2012-07-02 ENCOUNTER — Encounter: Payer: Self-pay | Admitting: Family Medicine

## 2012-07-02 VITALS — BP 122/74 | HR 76 | Temp 97.7°F | Ht 62.0 in | Wt 173.0 lb

## 2012-07-02 DIAGNOSIS — R0609 Other forms of dyspnea: Secondary | ICD-10-CM

## 2012-07-02 DIAGNOSIS — Z1211 Encounter for screening for malignant neoplasm of colon: Secondary | ICD-10-CM

## 2012-07-02 DIAGNOSIS — K219 Gastro-esophageal reflux disease without esophagitis: Secondary | ICD-10-CM

## 2012-07-02 DIAGNOSIS — R5381 Other malaise: Secondary | ICD-10-CM

## 2012-07-02 DIAGNOSIS — Z0000 Encounter for general adult medical examination without abnormal findings: Secondary | ICD-10-CM

## 2012-07-02 DIAGNOSIS — R5383 Other fatigue: Secondary | ICD-10-CM | POA: Insufficient documentation

## 2012-07-02 DIAGNOSIS — R0600 Dyspnea, unspecified: Secondary | ICD-10-CM | POA: Insufficient documentation

## 2012-07-02 NOTE — Progress Notes (Signed)
Subjective:    Patient ID: Kristy [name], female    DOB: 01-Apr-1941, 71 y.o.   MRN: [id number]  HPI I have personally reviewed the Medicare Annual Wellness questionnaire and have noted 1. The patient's medical and social history 2. Their use of alcohol, tobacco or illicit drugs 3. Their current medications and supplements 4. The patient's functional ability including ADL's, fall risks, home safety risks and hearing or visual             impairment. 5. Diet and physical activities 6. Evidence for depression or mood disorders The patients weight, height, BMI and visual acuity have been recorded in the chart I have made referrals, counseling and provided education to the patient based review of the above and I have provided the pt with a written personalized care plan for preventive services.  Hypertension:Well controlled . Discussed at last OV. Well controlled at home.  Has been more fatigued lately. Increased SOB, with exertion, none at rest. In last week. Has had GERD come back no matter what she eats, everytime.  Clearing throat. Not using any med for reflux.  Some difficulty sleeping at night.   Elevated Cholesterol: eval'd in 03/2012..not at goal on no medication. She has been working some on diet changes since. Lab Results  Component Value Date   CHOL 204* 03/28/2012   HDL 53.00 03/28/2012   LDLCALC 97 12/19/2010   LDLDIRECT 142.8 03/28/2012   TRIG 137.0 03/28/2012   CHOLHDL 4 03/28/2012   Diet compliance: Has tried to avoid fried foods. Exercise: Has been limited due to knee surgery.... Recently 2 times a week 30 min on bike. Other complaints:    Review of Systems  Constitutional: Negative for fever, fatigue and unexpected weight change.  HENT: Negative for ear pain, congestion, sore throat, sneezing, trouble swallowing and sinus pressure.   Eyes: Negative for pain and itching.  Respiratory: Positive for shortness of breath. Negative for cough and wheezing.   Cardiovascular:  Negative for chest pain, palpitations and leg swelling.  Gastrointestinal: Negative for nausea, abdominal pain, diarrhea, constipation and blood in stool.  Genitourinary: Negative for dysuria, hematuria, vaginal discharge, difficulty urinating and menstrual problem.  Skin: Negative for rash.  Neurological: Negative for syncope, weakness, light-headedness, numbness and headaches.  Psychiatric/Behavioral: Negative for confusion and dysphoric mood. The patient is not nervous/anxious.        Objective:   Physical Exam  Constitutional: Vital signs are normal. She appears well-developed and well-nourished. She is cooperative.  Non-toxic appearance. She does not appear ill. No distress.  HENT:  Head: Normocephalic.  Right Ear: Hearing, tympanic membrane, external ear and ear canal normal.  Left Ear: Hearing, tympanic membrane, external ear and ear canal normal.  Nose: Nose normal.  Eyes: Conjunctivae, EOM and lids are normal. Pupils are equal, round, and reactive to light. No foreign bodies found.  Neck: Trachea normal and normal range of motion. Neck supple. Carotid bruit is not present. No mass and no thyromegaly present.  Cardiovascular: Normal rate, regular rhythm, S1 normal, S2 normal, normal heart sounds and intact distal pulses.  Exam reveals no gallop.   No murmur heard. Pulmonary/Chest: Effort normal and breath sounds normal. No respiratory distress. She has no wheezes. She has no rhonchi. She has no rales.  Abdominal: Soft. Normal appearance and bowel sounds are normal. She exhibits no distension, no fluid wave, no abdominal bruit and no mass. There is no hepatosplenomegaly. There is no tenderness. There is no rebound, no guarding and no CVA tenderness.  No hernia.  Genitourinary: No breast swelling, tenderness, discharge or bleeding.  Lymphadenopathy:    She has no cervical adenopathy.    She has no axillary adenopathy.  Neurological: She is alert. She has normal strength. No cranial  nerve deficit or sensory deficit.  Skin: Skin is warm, dry and intact. No rash noted.  Psychiatric: Her speech is normal and behavior is normal. Judgment normal. Her mood appears not anxious. Cognition and memory are normal. She does not exhibit a depressed mood.          Assessment & Plan:  The patient's preventative maintenance and recommended screening tests for an annual wellness exam were reviewed in full today. Brought up to date unless services declined.  Counselled on the importance of diet, exercise, and its role in overall health and mortality. The patient's FH and SH was reviewed, including their home life, tobacco status, and drug and alcohol status.   Vaccines: uptodate Td and PNA, considering shingles Colon:Was last done over 11 years ago...  DVE/pap: total hysterectomy Mammo: 05/2011 nml, due now  DEXA:iverdue.

## 2012-07-02 NOTE — Patient Instructions (Addendum)
Avoid tomatos, citris, spicy foods, alcholol, caffeine, chocolate. Start prilosec 20 mg 2 tabs daily for 4-6 weeks. Call if symptoms are not  Any better in 2 weeks. Stay off the meloxicam. Look into shingles vaccine coverage. Stop by front desk to set up refferrals. Schedule your own mammogram and bone density

## 2012-07-02 NOTE — Assessment & Plan Note (Signed)
EKg shows possible LVH different from 2012 EKG.. Will send for ECHo to eval furhter. If DOE and fatigue continuing and ECHO nml. Consider labs as well.

## 2012-07-02 NOTE — Assessment & Plan Note (Signed)
Likely gastitis, GERD causing symptoms.. Avoid NSAIDs, (meloxicam may have started issue) decrease tomatos. Prilosec 40 mg daily. Call if not improving.

## 2012-07-03 ENCOUNTER — Other Ambulatory Visit (HOSPITAL_COMMUNITY): Payer: Medicare Other

## 2012-07-09 ENCOUNTER — Ambulatory Visit (HOSPITAL_COMMUNITY): Payer: Medicare Other | Attending: Family Medicine

## 2012-07-09 ENCOUNTER — Other Ambulatory Visit (HOSPITAL_COMMUNITY): Payer: Medicare Other

## 2012-07-09 DIAGNOSIS — R5383 Other fatigue: Secondary | ICD-10-CM

## 2012-07-09 DIAGNOSIS — I079 Rheumatic tricuspid valve disease, unspecified: Secondary | ICD-10-CM | POA: Insufficient documentation

## 2012-07-09 DIAGNOSIS — I7789 Other specified disorders of arteries and arterioles: Secondary | ICD-10-CM | POA: Insufficient documentation

## 2012-07-09 DIAGNOSIS — R0989 Other specified symptoms and signs involving the circulatory and respiratory systems: Secondary | ICD-10-CM | POA: Insufficient documentation

## 2012-07-09 DIAGNOSIS — F17200 Nicotine dependence, unspecified, uncomplicated: Secondary | ICD-10-CM | POA: Insufficient documentation

## 2012-07-09 DIAGNOSIS — R0602 Shortness of breath: Secondary | ICD-10-CM

## 2012-07-09 DIAGNOSIS — R5381 Other malaise: Secondary | ICD-10-CM | POA: Insufficient documentation

## 2012-07-09 DIAGNOSIS — R0600 Dyspnea, unspecified: Secondary | ICD-10-CM

## 2012-07-09 DIAGNOSIS — R0609 Other forms of dyspnea: Secondary | ICD-10-CM | POA: Insufficient documentation

## 2012-07-09 DIAGNOSIS — I10 Essential (primary) hypertension: Secondary | ICD-10-CM | POA: Insufficient documentation

## 2012-07-09 NOTE — Progress Notes (Signed)
Echocardiogram performed.  

## 2012-07-12 ENCOUNTER — Telehealth: Payer: Self-pay

## 2012-07-12 NOTE — Telephone Encounter (Signed)
Pt request echo results done on 07/03/12.Please advise.

## 2012-07-12 NOTE — Telephone Encounter (Signed)
Advised patient of results.  

## 2012-07-12 NOTE — Telephone Encounter (Signed)
See previous notes:  Notes Recorded by Willey Blade, CMA on 07/11/2012 at 4:56 PM Patient informed letter sent. ------  Notes Recorded by Excell Seltzer, MD on 07/09/2012 at 11:54 PM Notify pt that heart strength was normal. No valve issues. If shortness of breath continuing.. Make appt for follow up if not already scheduled.

## 2012-07-17 ENCOUNTER — Other Ambulatory Visit: Payer: Self-pay | Admitting: Orthopedic Surgery

## 2012-07-17 DIAGNOSIS — M25561 Pain in right knee: Secondary | ICD-10-CM

## 2012-07-24 ENCOUNTER — Other Ambulatory Visit: Payer: Medicare Other

## 2012-07-25 ENCOUNTER — Ambulatory Visit
Admission: RE | Admit: 2012-07-25 | Discharge: 2012-07-25 | Disposition: A | Discharge status: Home / Self Care | Payer: Medicare Other | Source: Ambulatory Visit | Attending: Orthopedic Surgery | Admitting: Orthopedic Surgery

## 2012-07-25 ENCOUNTER — Ambulatory Visit (AMBULATORY_SURGERY_CENTER): Payer: Medicare Other | Admitting: *Deleted

## 2012-07-25 VITALS — Ht 61.0 in | Wt 178.2 lb

## 2012-07-25 DIAGNOSIS — Z1211 Encounter for screening for malignant neoplasm of colon: Secondary | ICD-10-CM

## 2012-07-25 DIAGNOSIS — M25561 Pain in right knee: Secondary | ICD-10-CM

## 2012-07-25 MED ORDER — PEG-KCL-NACL-NASULF-NA ASC-C 100 G PO SOLR: ORAL | Status: DC

## 2012-07-25 NOTE — Progress Notes (Signed)
No allergy to eggs or soy products  Pt had colonoscopy 12 years at [facility].  Doesn't remember the name of her physician but states she didn't have polyps

## 2012-07-29 ENCOUNTER — Encounter: Payer: Self-pay | Admitting: Gastroenterology

## 2012-08-01 ENCOUNTER — Ambulatory Visit
Admission: RE | Admit: 2012-08-01 | Discharge: 2012-08-01 | Disposition: A | Discharge status: Home / Self Care | Payer: Medicare Other | Source: Ambulatory Visit | Attending: Orthopedic Surgery | Admitting: Orthopedic Surgery

## 2012-08-01 DIAGNOSIS — M25561 Pain in right knee: Secondary | ICD-10-CM

## 2012-08-07 ENCOUNTER — Encounter: Payer: Medicare Other | Admitting: Gastroenterology

## 2012-08-20 ENCOUNTER — Ambulatory Visit: Payer: Medicare Other

## 2012-08-23 ENCOUNTER — Ambulatory Visit: Payer: Medicare Other

## 2012-08-27 ENCOUNTER — Ambulatory Visit: Payer: Medicare Other

## 2012-08-30 ENCOUNTER — Ambulatory Visit (INDEPENDENT_AMBULATORY_CARE_PROVIDER_SITE_OTHER): Payer: Medicare Other | Admitting: Family Medicine

## 2012-08-30 ENCOUNTER — Ambulatory Visit: Payer: Medicare Other

## 2012-08-30 ENCOUNTER — Encounter: Payer: Self-pay | Admitting: Family Medicine

## 2012-08-30 VITALS — BP 128/80 | HR 62 | Temp 98.3°F | Resp 20 | Ht 61.0 in | Wt 179.5 lb

## 2012-08-30 DIAGNOSIS — L0390 Cellulitis, unspecified: Secondary | ICD-10-CM

## 2012-08-30 DIAGNOSIS — L0291 Cutaneous abscess, unspecified: Secondary | ICD-10-CM

## 2012-08-30 DIAGNOSIS — Z23 Encounter for immunization: Secondary | ICD-10-CM

## 2012-08-30 MED ORDER — CEPHALEXIN 500 MG PO CAPS: 500.0000 mg | ORAL_CAPSULE | Freq: Three times a day (TID) | ORAL | Status: DC

## 2012-08-30 MED ORDER — CEPHALEXIN 500 MG PO CAPS: 500.0000 mg | ORAL_CAPSULE | Freq: Three times a day (TID) | ORAL | Status: AC

## 2012-08-30 NOTE — Patient Instructions (Signed)
Apply warm compresses on lesions 2-3 times a day then cover with antibiotic ointment. If redness spreading and not improving in next 203 day.. Fill antibiotics.  We will call with culture results.

## 2012-08-30 NOTE — Assessment & Plan Note (Signed)
Wound culture sent , no clear increased risk for MRSA. Start warm compress, neosporin. If not imprpving or ir redness spreading start 7 day course of keflex. This was printed for pt given she is going to [place] this weekend.

## 2012-08-30 NOTE — Progress Notes (Signed)
  Subjective:    Patient ID: Kristy [name], female    DOB: 09-18-1941, 71 y.o.   MRN: [id number]  HPI  71 year old female  with prediabetes presents with new onset of tender lesion on upper abdomen.  She reports she noted it in last 24 hours. She describes it as pimple like area, with surrounding erythema.  No redness spreading. No drainage. Stinging and burning when washing. Applied bandaid and salve.  No fever.  No additional symtoms. No flu like illness.    Review of Systems  Constitutional: Negative for fever and fatigue.  HENT: Negative for ear pain.   Eyes: Negative for pain.  Respiratory: Negative for chest tightness and shortness of breath.   Cardiovascular: Negative for chest pain, palpitations and leg swelling.  Gastrointestinal: Negative for abdominal pain.  Genitourinary: Negative for dysuria.       Objective:   Physical Exam  Constitutional: She appears well-developed and well-nourished.  Eyes: Conjunctivae normal are normal. Pupils are equal, round, and reactive to light.  Neck: Normal range of motion. Neck supple. No thyromegaly present.  Cardiovascular: Normal rate, regular rhythm and normal heart sounds.  Exam reveals no friction rub.   No murmur heard. Pulmonary/Chest: Effort normal and breath sounds normal.  Abdominal: Soft. Bowel sounds are normal. There is no tenderness.  Skin: Skin is warm.       Pustule right mid abdomen at bra line, surrounding erythema and slight warms, no fluctuance. Pus expressed with gentle pressure          Assessment & Plan:

## 2012-09-03 ENCOUNTER — Telehealth: Payer: Self-pay

## 2012-09-03 NOTE — Telephone Encounter (Signed)
Pt is out of town and will be back this weekend; pt has found lump size of pea in breast; pt wants to know if should make appt for Mon or get mammogram first.Please advise.

## 2012-09-04 LAB — WOUND CULTURE
Gram Stain: NONE SEEN
Gram Stain: NONE SEEN

## 2012-09-05 NOTE — Telephone Encounter (Signed)
She should make an appt for an exam because would possibly need to change to a diagnostic mammo.

## 2012-09-05 NOTE — Telephone Encounter (Signed)
Spoke with pt and she is already scheduled 09/10/12 at 9:15 am.

## 2012-09-10 ENCOUNTER — Encounter: Payer: Self-pay | Admitting: Family Medicine

## 2012-09-10 ENCOUNTER — Ambulatory Visit (INDEPENDENT_AMBULATORY_CARE_PROVIDER_SITE_OTHER): Payer: Medicare Other | Admitting: Family Medicine

## 2012-09-10 VITALS — BP 136/80 | HR 60 | Temp 98.3°F | Resp 20 | Ht 61.0 in | Wt 177.5 lb

## 2012-09-10 DIAGNOSIS — N6310 Unspecified lump in the right breast, unspecified quadrant: Secondary | ICD-10-CM

## 2012-09-10 DIAGNOSIS — D0511 Intraductal carcinoma in situ of right breast: Secondary | ICD-10-CM | POA: Insufficient documentation

## 2012-09-10 DIAGNOSIS — N63 Unspecified lump in unspecified breast: Secondary | ICD-10-CM

## 2012-09-10 NOTE — Patient Instructions (Addendum)
Stop at front desk to set up mammogram.  We will call with results.

## 2012-09-10 NOTE — Progress Notes (Signed)
  Subjective:    Patient ID: Kristy [name], female    DOB: [birth year], 71 y.o.   MRN: [id number]  HPI  71 year old female presents with new lump in right breast.. Noted pea size lesion 10 days ago, right upper outer breast. Area was mildly tender, decreasing now some in size and tenderness.  No fever, no flu-like symptoms, some hot and cold flashes ongoing for years, no unexpected weight loss.  No N/V. No axillary tenderness.  DIGITAL SCREENING MAMMOGRAM WITH CAD: 09/2011 There are scattered fibroglandular densities. No masses or malignant type calcifications are  identified.    .  Review of Systems  Constitutional: Negative for fever and fatigue.  HENT: Negative for ear pain.   Eyes: Negative for pain.  Respiratory: Negative for chest tightness and shortness of breath.   Cardiovascular: Negative for chest pain, palpitations and leg swelling.  Gastrointestinal: Negative for abdominal pain.  Genitourinary: Negative for dysuria.       Objective:   Physical Exam  Constitutional: She appears well-developed and well-nourished.  HENT:  Head: Normocephalic.  Neck: Normal range of motion. Neck supple. No thyromegaly present.  Cardiovascular: Normal rate.   Pulmonary/Chest: Effort normal.  Genitourinary: There is breast swelling. No breast discharge. Pelvic exam was performed with patient supine.       10 oclock 2 cm firm nodule, mobile, mild ttp.  No B axillary lymphadenopathy  left breast exam normal          Assessment & Plan:

## 2012-09-12 ENCOUNTER — Ambulatory Visit
Admission: RE | Admit: 2012-09-12 | Discharge: 2012-09-12 | Disposition: A | Discharge status: Home / Self Care | Payer: Medicare Other | Source: Ambulatory Visit | Attending: Family Medicine | Admitting: Family Medicine

## 2012-09-12 ENCOUNTER — Other Ambulatory Visit: Payer: Self-pay | Admitting: Family Medicine

## 2012-09-12 DIAGNOSIS — N6310 Unspecified lump in the right breast, unspecified quadrant: Secondary | ICD-10-CM

## 2012-09-17 ENCOUNTER — Ambulatory Visit
Admission: RE | Admit: 2012-09-17 | Discharge: 2012-09-17 | Disposition: A | Discharge status: Home / Self Care | Payer: Medicare Other | Source: Ambulatory Visit | Attending: Family Medicine | Admitting: Family Medicine

## 2012-09-17 DIAGNOSIS — N6310 Unspecified lump in the right breast, unspecified quadrant: Secondary | ICD-10-CM

## 2012-10-04 ENCOUNTER — Encounter: Payer: Self-pay | Admitting: Family Medicine

## 2012-10-04 ENCOUNTER — Ambulatory Visit (INDEPENDENT_AMBULATORY_CARE_PROVIDER_SITE_OTHER): Payer: Medicare Other | Admitting: Family Medicine

## 2012-10-04 VITALS — BP 140/80 | HR 110 | Temp 98.5°F | Ht 61.0 in | Wt 179.5 lb

## 2012-10-04 DIAGNOSIS — J069 Acute upper respiratory infection, unspecified: Secondary | ICD-10-CM

## 2012-10-04 DIAGNOSIS — J309 Allergic rhinitis, unspecified: Secondary | ICD-10-CM

## 2012-10-04 NOTE — Assessment & Plan Note (Signed)
Likely a contributor.  Start zyrtec at bedtime.

## 2012-10-04 NOTE — Assessment & Plan Note (Signed)
Symptomatic care 

## 2012-10-04 NOTE — Progress Notes (Signed)
  Subjective:    Patient ID: Kristy [name], female    DOB: 07-25-1941, 71 y.o.   MRN: [id number]  HPI  71 year old female presents with  Nasal congestion, eyes watering x 1 week. Sinus pressure , headache. Fatigue. Pressure and congestion in left ear. No pain.   Some occ cough yellow from post nasal drip, mild SOB.  No fever.  Not taking any med for symptoms.      Review of Systems  Constitutional: Negative for fever and fatigue.  HENT: Negative for ear pain.   Eyes: Negative for pain.  Respiratory: Negative for chest tightness and shortness of breath.   Cardiovascular: Negative for chest pain, palpitations and leg swelling.  Gastrointestinal: Negative for abdominal pain.  Genitourinary: Negative for dysuria.       Objective:   Physical Exam  Constitutional: Vital signs are normal. She appears well-developed and well-nourished. She is cooperative.  Non-toxic appearance. She does not appear ill. No distress.  HENT:  Head: Normocephalic.  Right Ear: Hearing, tympanic membrane, external ear and ear canal normal. Tympanic membrane is not erythematous, not retracted and not bulging.  Left Ear: Hearing, tympanic membrane, external ear and ear canal normal. Tympanic membrane is not erythematous, not retracted and not bulging.  Nose: Mucosal edema and rhinorrhea present. Right sinus exhibits no maxillary sinus tenderness and no frontal sinus tenderness. Left sinus exhibits no maxillary sinus tenderness and no frontal sinus tenderness.  Mouth/Throat: Uvula is midline, oropharynx is clear and moist and mucous membranes are normal.       Left TM with clear fluid, no bulging, good light reflex  Eyes: Conjunctivae normal, EOM and lids are normal. Pupils are equal, round, and reactive to light. No foreign bodies found.  Neck: Trachea normal and normal range of motion. Neck supple. Carotid bruit is not present. No mass and no thyromegaly present.  Cardiovascular: Normal rate, regular  rhythm, S1 normal, S2 normal, normal heart sounds, intact distal pulses and normal pulses.  Exam reveals no gallop and no friction rub.   No murmur heard. Pulmonary/Chest: Effort normal and breath sounds normal. Not tachypneic. No respiratory distress. She has no decreased breath sounds. She has no wheezes. She has no rhonchi. She has no rales.  Neurological: She is alert.  Skin: Skin is warm, dry and intact. No rash noted.  Psychiatric: Her speech is normal and behavior is normal. Judgment normal. Her mood appears not anxious. Cognition and memory are normal. She does not exhibit a depressed mood.          Assessment & Plan:

## 2012-10-04 NOTE — Patient Instructions (Addendum)
Start nasal saline spray 2-3 times a day, mucinex DM twice daily. Consider zyrtec at bedtime. Call if not turning the corner by early next week.

## 2012-10-14 ENCOUNTER — Other Ambulatory Visit: Payer: Self-pay | Admitting: Family Medicine

## 2012-12-19 ENCOUNTER — Ambulatory Visit: Payer: Medicare Other | Admitting: Family Medicine

## 2012-12-24 ENCOUNTER — Ambulatory Visit (INDEPENDENT_AMBULATORY_CARE_PROVIDER_SITE_OTHER): Payer: Medicare Other | Admitting: Family Medicine

## 2012-12-24 ENCOUNTER — Encounter: Payer: Self-pay | Admitting: Family Medicine

## 2012-12-24 VITALS — BP 120/74 | HR 66 | Temp 98.4°F | Ht 61.0 in | Wt 175.8 lb

## 2012-12-24 DIAGNOSIS — R079 Chest pain, unspecified: Secondary | ICD-10-CM

## 2012-12-24 DIAGNOSIS — I7789 Other specified disorders of arteries and arterioles: Secondary | ICD-10-CM

## 2012-12-24 DIAGNOSIS — I10 Essential (primary) hypertension: Secondary | ICD-10-CM

## 2012-12-24 DIAGNOSIS — E785 Hyperlipidemia, unspecified: Secondary | ICD-10-CM

## 2012-12-24 LAB — COMPREHENSIVE METABOLIC PANEL
ALT: 15 U/L (ref 0–35)
AST: 15 U/L (ref 0–37)
Albumin: 4.4 g/dL (ref 3.5–5.2)
Alkaline Phosphatase: 63 U/L (ref 39–117)
BUN: 21 mg/dL (ref 6–23)
CO2: 30 mEq/L (ref 19–32)
Calcium: 10.2 mg/dL (ref 8.4–10.5)
Chloride: 101 mEq/L (ref 96–112)
Creatinine, Ser: 0.9 mg/dL (ref 0.4–1.2)
GFR: 79.71 mL/min (ref >60.00)
Glucose, Bld: 106 mg/dL — ABNORMAL HIGH (ref 70–99)
Potassium: 3.3 mEq/L — ABNORMAL LOW (ref 3.5–5.1)
Sodium: 139 mEq/L (ref 135–145)
Total Bilirubin: 0.4 mg/dL (ref 0.3–1.2)
Total Protein: 8 g/dL (ref 6.0–8.3)

## 2012-12-24 LAB — LIPID PANEL
Cholesterol: 238 mg/dL — ABNORMAL HIGH (ref 0–200)
HDL: 44.8 mg/dL (ref >39.00)
Total CHOL/HDL Ratio: 5
Triglycerides: 160 mg/dL — ABNORMAL HIGH (ref 0.0–149.0)
VLDL: 32 mg/dL (ref 0.0–40.0)

## 2012-12-24 LAB — LDL CHOLESTEROL, DIRECT: Direct LDL: 155.1 mg/dL

## 2012-12-24 MED ORDER — CHLORTHALIDONE 25 MG PO TABS: 25.0000 mg | ORAL_TABLET | Freq: Every day | ORAL | Status: DC

## 2012-12-24 NOTE — Assessment & Plan Note (Signed)
Needs yearly eval.. Refer to new vascular MD given Dr. Earnestine Leys moved.

## 2012-12-24 NOTE — Patient Instructions (Addendum)
Stop at front desk to get set u[p with a new vascular MD now that Dr. Earnestine Leys has moved. We will call you with lab results. Stop acidic foods like tomatos, spicy, citris, chocolate, alcohol etc. Start prilosec 2 tabs of 20 mg daily x 4-6 week. If not improving in 1-2 week, call. If resolved after 4-6 week then taper off prilosec slowly.

## 2012-12-24 NOTE — Assessment & Plan Note (Signed)
Doubt cardiac source given history and EKG stable. Start prilosec 40 daily and avoid triggers.

## 2012-12-24 NOTE — Assessment & Plan Note (Signed)
Due for re-eval. 

## 2012-12-24 NOTE — Assessment & Plan Note (Signed)
Well controlled on current medication. Refilled.

## 2012-12-24 NOTE — Progress Notes (Addendum)
  Subjective:    Patient ID: Kristy [name], female    DOB: 1941-08-27, 72 y.o.   MRN: [id number]  HPI 72 year old female with history of  HTN, prediabetesgeneralized anxiety and IBS presents with tightness in chest, indigestion. Can feel the chest pain at rest or with exertion, can last mins to hours. No associated SOB, no sweating.  Associated with tingling in left arm, belching. Occ has felt food regurgitate with it.  Occuring off and on for 1 month. Since eating earlier in day she was getting better until 2 days ago after eating cabbage.  tiued tums, tea, taken 2 days of prilosec 1 tabs daily... Has eased it some.  Has not been taking any OTC NSAIDs.  CAD risk factors: due for chol recheck, no DM, has HTN, some family history, no personal DM or CAD, PVD.  HTN: BP looking pretty good now she has been taking full chlortalidone dose along with toprol XL.   Vascular MD has moved.. She needs a referral to a new one for her yearly eval of fibromuscular dysplasia.    Review of Systems  Constitutional: Negative for fever and fatigue.  HENT: Negative for ear pain.   Eyes: Negative for pain.  Respiratory: Negative for chest tightness and shortness of breath.   Cardiovascular: Positive for chest pain. Negative for palpitations and leg swelling.  Gastrointestinal: Negative for abdominal pain.  Genitourinary: Negative for dysuria.       Objective:   Physical Exam  Constitutional: Vital signs are normal. She appears well-developed and well-nourished. She is cooperative.  Non-toxic appearance. She does not appear ill. No distress.  HENT:  Head: Normocephalic.  Right Ear: Hearing, tympanic membrane, external ear and ear canal normal. Tympanic membrane is not erythematous, not retracted and not bulging.  Left Ear: Hearing, tympanic membrane, external ear and ear canal normal. Tympanic membrane is not erythematous, not retracted and not bulging.  Nose: No mucosal edema or rhinorrhea.  Right sinus exhibits no maxillary sinus tenderness and no frontal sinus tenderness. Left sinus exhibits no maxillary sinus tenderness and no frontal sinus tenderness.  Mouth/Throat: Uvula is midline, oropharynx is clear and moist and mucous membranes are normal.  Eyes: Conjunctivae normal, EOM and lids are normal. Pupils are equal, round, and reactive to light. No foreign bodies found.  Neck: Trachea normal and normal range of motion. Neck supple. Carotid bruit is not present. No mass and no thyromegaly present.  Cardiovascular: Normal rate, regular rhythm, S1 normal, S2 normal, normal heart sounds, intact distal pulses and normal pulses.  Exam reveals no gallop and no friction rub.   No murmur heard. Pulmonary/Chest: Effort normal and breath sounds normal. Not tachypneic. No respiratory distress. She has no decreased breath sounds. She has no wheezes. She has no rhonchi. She has no rales.  Abdominal: Soft. Normal appearance and bowel sounds are normal. There is no tenderness.  Neurological: She is alert.  Skin: Skin is warm, dry and intact. No rash noted.  Psychiatric: Her speech is normal and behavior is normal. Judgment and thought content normal. Her mood appears not anxious. Cognition and memory are normal. She does not exhibit a depressed mood.          Assessment & Plan:

## 2013-01-16 ENCOUNTER — Other Ambulatory Visit: Payer: Self-pay | Admitting: Family Medicine

## 2013-01-16 ENCOUNTER — Other Ambulatory Visit: Payer: Self-pay

## 2013-01-16 DIAGNOSIS — N6310 Unspecified lump in the right breast, unspecified quadrant: Secondary | ICD-10-CM

## 2013-01-28 ENCOUNTER — Ambulatory Visit
Admission: RE | Admit: 2013-01-28 | Discharge: 2013-01-28 | Disposition: A | Discharge status: Home / Self Care | Payer: Medicare Other | Source: Ambulatory Visit

## 2013-01-28 ENCOUNTER — Other Ambulatory Visit: Payer: Self-pay

## 2013-01-28 DIAGNOSIS — N6310 Unspecified lump in the right breast, unspecified quadrant: Secondary | ICD-10-CM

## 2013-03-03 ENCOUNTER — Telehealth: Payer: Self-pay | Admitting: Family Medicine

## 2013-03-03 NOTE — Telephone Encounter (Signed)
Patient advised with information on voice mail

## 2013-03-03 NOTE — Telephone Encounter (Signed)
Zyrtec or allegra otc

## 2013-03-03 NOTE — Telephone Encounter (Signed)
Patient Information:  Caller Name: Kryssa  Phone: [phone]  Patient: Kristy [name], Kristy [name]  Gender: Female  DOB: 12/09/1940  Age: 72 Years  PCP: Kerby Nora (Family Practice)  Office Follow Up:  Does the office need to follow up with this patient?: Yes  Instructions For The Office: Patient is requesting recommendation of medication to treat allergy symptoms due to effect on blood pressure.  Cannot take Mucinex or Claritin.  Please follow up with patient on her Cell, number is [phone]  RN Note:  Patient states that symptoms are recurrent each year and she feels that it is related to the pollen.  Patient has trouble with her blood pressure and cannot take certain over the counter medications because it will cause her blood pressure to increase and would like a recommendation from the provider as to which medication would be preferred to treat her allergy symptoms based on her medical history.  Please follow up with patient on her Cell number is [phone]  Symptoms  Reason For Call & Symptoms: Coughing, runny nose, itchy watery eyes sneezing.  Reviewed Health History In EMR: Yes  Reviewed Medications In EMR: Yes  Reviewed Allergies In EMR: Yes  Reviewed Surgeries / Procedures: Yes  Date of Onset of Symptoms: 02/28/2013  Treatments Tried: Saline spray  Treatments Tried Worked: No  Guideline(s) Used:  Hay Fever - Nasal Allergies  Disposition Per Guideline:   Home Care  Reason For Disposition Reached:   Nasal allergies occur only certain times of year  Advice Given:  Wash off Pollen Daily:  Remove pollen from the body with hair washing and a shower, especially before bedtime.  Avoiding Pollen:  Keep windows closed in home, at least in bedroom; use air conditioner  Use a high efficiency house air filter (HEPA or electrostatic)  Keep windows closed in car, turn AC on recirculate  Avoiding Pollen:  Stay indoors on windy days  Keep windows closed in home, at least in  bedroom; use air conditioner  Use a high efficiency house air filter (HEPA or electrostatic)  Keep windows closed in car, turn AC on recirculate  For a Stuffy Nose - Use Nasal Washes:  Step-By-Step Instructions:   Step 1: Lean over a sink.  Step 2: Gently squirt or spray warm salt water into one of your nostrils.  Step 3: Some of the water may run into the back of your throat. Spit this out. If you swallow the salt water it will not hurt you.  Step 4: Blow your nose to clean out the water and mucus.  Step 5: Repeat steps 1-4 for the other nostril. You can do this a couple times a day if it seems to help you.  Patient Refused Recommendation:  Patient Requests Prescription  Patient is requesting medication recommendation from the provider to assist in treatment of symptoms.

## 2013-03-06 ENCOUNTER — Telehealth: Payer: Self-pay | Admitting: Family Medicine

## 2013-03-06 NOTE — Telephone Encounter (Signed)
Patient Information:  Caller Name: Parilee  Phone: [phone]  Patient: Kristy [name], Kristy [name]  Gender: Female  DOB: 06-17-1941  Age: 72 Years  PCP: Kerby Nora (Family Practice)  Office Follow Up:  Does the office need to follow up with this patient?: No  Instructions For The Office: N/A  RN Note:  Head is stuffy and cough is productive- yellow in color.  Is afebrile- but is having some lightheadedness. I not able to breath even using nasal irrigations.  Appetite poor, but is drinking; Triaged with care advice given.  All appointments for today are filled 03/06/13.  Patient did not want to travel to [place] or [place] for care.  Was able to schedule patient with Dr. Karie Georges for 03/07/13 at 08:45.  Patient will call back as needed.  Symptoms  Reason For Call & Symptoms: Allergies that have produced productive cough-yellow sputum  Reviewed Health History In EMR: Yes  Reviewed Medications In EMR: Yes  Reviewed Allergies In EMR: Yes  Reviewed Surgeries / Procedures: Yes  Date of Onset of Symptoms: 03/01/2013  Guideline(s) Used:  Cough  Disposition Per Guideline:   See Today or Tomorrow in Office  Reason For Disposition Reached:   Patient wants to be seen  Advice Given:  Cough Medicines:  Home Remedy - Honey: This old home remedy has been shown to help decrease coughing at night. The adult dosage is 2 teaspoons (10 ml) at bedtime. Honey should not be given to infants under one year of age.  Coughing Spasms:  Drink warm fluids. Inhale warm mist (Reason: both relax the airway and loosen up the phlegm).  Suck on cough drops or hard candy to coat the irritated throat.  Call Back If:  Difficulty breathing  Cough lasts more than 3 weeks  You become worse.  Patient Will Follow Care Advice:  YES  Appointment Scheduled:  03/07/2013 08:45:00 Appointment Scheduled Provider:  Kerby Nora [facility])

## 2013-03-07 ENCOUNTER — Encounter: Payer: Self-pay | Admitting: Family Medicine

## 2013-03-07 ENCOUNTER — Ambulatory Visit (INDEPENDENT_AMBULATORY_CARE_PROVIDER_SITE_OTHER): Payer: Medicare Other | Admitting: Family Medicine

## 2013-03-07 VITALS — BP 124/78 | HR 81 | Temp 98.4°F | Ht 61.0 in | Wt 177.5 lb

## 2013-03-07 DIAGNOSIS — J209 Acute bronchitis, unspecified: Secondary | ICD-10-CM

## 2013-03-07 MED ORDER — AZITHROMYCIN 250 MG PO TABS: ORAL_TABLET | ORAL | Status: DC

## 2013-03-07 MED ORDER — GUAIFENESIN-CODEINE 100-10 MG/5ML PO SYRP: 5.0000 mL | ORAL_SOLUTION | Freq: Every evening | ORAL | Status: DC | PRN

## 2013-03-07 NOTE — Progress Notes (Signed)
  Subjective:    Patient ID: Kristy [name], female    DOB: 12-Oct-1941, 72 y.o.   MRN: [id number]  Cough This is a new problem. The current episode started in the past 7 days (1 week). The cough is productive of purulent sputum (cough kee(ping her up at night). Associated symptoms include ear congestion, nasal congestion, rhinorrhea and a sore throat. Pertinent negatives include no chest pain, chills, ear pain, fever, headaches, postnasal drip, shortness of breath or wheezing. Nothing aggravates the symptoms. She has tried nothing for the symptoms. There is no history of asthma, COPD, emphysema, environmental allergies or pneumonia.      Review of Systems  Constitutional: Negative for fever and chills.  HENT: Positive for sore throat and rhinorrhea. Negative for ear pain and postnasal drip.   Respiratory: Positive for cough. Negative for shortness of breath and wheezing.   Cardiovascular: Negative for chest pain.  Allergic/Immunologic: Negative for environmental allergies.  Neurological: Negative for headaches.       Objective:   Physical Exam  Constitutional: Vital signs are normal. She appears well-developed and well-nourished. She is cooperative.  Non-toxic appearance. She does not appear ill. No distress.  HENT:  Head: Normocephalic.  Right Ear: Hearing, tympanic membrane, external ear and ear canal normal. Tympanic membrane is not erythematous, not retracted and not bulging.  Left Ear: Hearing, tympanic membrane, external ear and ear canal normal. Tympanic membrane is not erythematous, not retracted and not bulging.  Nose: Mucosal edema and rhinorrhea present. Right sinus exhibits no maxillary sinus tenderness and no frontal sinus tenderness. Left sinus exhibits no maxillary sinus tenderness and no frontal sinus tenderness.  Mouth/Throat: Uvula is midline, oropharynx is clear and moist and mucous membranes are normal.  Eyes: Conjunctivae, EOM and lids are normal. Pupils are equal,  round, and reactive to light. No foreign bodies found.  Neck: Trachea normal and normal range of motion. Neck supple. Carotid bruit is not present. No mass and no thyromegaly present.  Cardiovascular: Normal rate, regular rhythm, S1 normal, S2 normal, normal heart sounds, intact distal pulses and normal pulses.  Exam reveals no gallop and no friction rub.   No murmur heard. Pulmonary/Chest: Effort normal and breath sounds normal. Not tachypneic. No respiratory distress. She has no decreased breath sounds. She has no wheezes. She has no rhonchi. She has no rales.  Neurological: She is alert.  Skin: Skin is warm, dry and intact. No rash noted.  Psychiatric: Her speech is normal and behavior is normal. Judgment normal. Her mood appears not anxious. Cognition and memory are normal. She does not exhibit a depressed mood.          Assessment & Plan:

## 2013-03-07 NOTE — Patient Instructions (Addendum)
Mucinex DM or Delsym during the day. Prescription cough suppressant at night.  Call if not improving as expected or go to ER if severe shortness of breath.

## 2013-03-07 NOTE — Assessment & Plan Note (Signed)
>  7 days illness.. Treat with antibiotics.  Cough suppressant at night.

## 2013-03-21 ENCOUNTER — Telehealth: Payer: Self-pay | Admitting: Family Medicine

## 2013-03-21 DIAGNOSIS — E876 Hypokalemia: Secondary | ICD-10-CM

## 2013-03-21 NOTE — Telephone Encounter (Signed)
Message copied by Excell Seltzer on Fri Mar 21, 2013  2:03 PM ------      Message from: Baldomero Lamy      Created: Thu Mar 13, 2013  9:50 AM      Regarding: Labs 5/7       Pt is scheduled to have a lipid drawn 5/7. Lab mentioned potassium was low due to meds. Do you want a bmet , potassium level or any other labs drawn as well?                   Thanks      Tasha ------

## 2013-03-21 NOTE — Telephone Encounter (Signed)
Will add potassium check.

## 2013-03-25 ENCOUNTER — Telehealth: Payer: Self-pay | Admitting: Family Medicine

## 2013-03-25 DIAGNOSIS — E785 Hyperlipidemia, unspecified: Secondary | ICD-10-CM

## 2013-03-25 NOTE — Telephone Encounter (Signed)
Message copied by Excell Seltzer on Tue Mar 25, 2013  4:21 PM ------      Message from: Alvina Chou      Created: Tue Mar 25, 2013 11:43 AM      Regarding: Lab orders for Wednesday, 5.7.14       Do you want K+ and Lipid ? ------

## 2013-03-26 ENCOUNTER — Other Ambulatory Visit (INDEPENDENT_AMBULATORY_CARE_PROVIDER_SITE_OTHER): Payer: Medicare Other

## 2013-03-26 DIAGNOSIS — E876 Hypokalemia: Secondary | ICD-10-CM

## 2013-03-26 DIAGNOSIS — E785 Hyperlipidemia, unspecified: Secondary | ICD-10-CM

## 2013-03-26 DIAGNOSIS — I10 Essential (primary) hypertension: Secondary | ICD-10-CM

## 2013-03-26 LAB — COMPREHENSIVE METABOLIC PANEL
ALT: 14 U/L (ref 0–35)
AST: 15 U/L (ref 0–37)
Albumin: 3.8 g/dL (ref 3.5–5.2)
Alkaline Phosphatase: 67 U/L (ref 39–117)
BUN: 9 mg/dL (ref 6–23)
CO2: 29 mEq/L (ref 19–32)
Calcium: 9.3 mg/dL (ref 8.4–10.5)
Chloride: 102 mEq/L (ref 96–112)
Creatinine, Ser: 0.8 mg/dL (ref 0.4–1.2)
GFR: 95.36 mL/min (ref >60.00)
Glucose, Bld: 111 mg/dL — ABNORMAL HIGH (ref 70–99)
Potassium: 3 mEq/L — ABNORMAL LOW (ref 3.5–5.1)
Sodium: 137 mEq/L (ref 135–145)
Total Bilirubin: 0.6 mg/dL (ref 0.3–1.2)
Total Protein: 7.1 g/dL (ref 6.0–8.3)

## 2013-03-26 LAB — LIPID PANEL
Cholesterol: 209 mg/dL — ABNORMAL HIGH (ref 0–200)
HDL: 41.7 mg/dL (ref >39.00)
Total CHOL/HDL Ratio: 5
Triglycerides: 158 mg/dL — ABNORMAL HIGH (ref 0.0–149.0)
VLDL: 31.6 mg/dL (ref 0.0–40.0)

## 2013-03-26 LAB — LDL CHOLESTEROL, DIRECT: Direct LDL: 140.3 mg/dL

## 2013-03-31 ENCOUNTER — Telehealth: Payer: Self-pay

## 2013-03-31 ENCOUNTER — Other Ambulatory Visit: Payer: Self-pay | Admitting: Family Medicine

## 2013-03-31 DIAGNOSIS — E876 Hypokalemia: Secondary | ICD-10-CM

## 2013-03-31 NOTE — Telephone Encounter (Signed)
Pt left v/m requesting refill metoprolol 100 mg;pharmacist at [facility] said rx ready for pick up. Pt notified med ready for pick up. Pt also request call back with labs results from  03/26/13 when available.

## 2013-04-01 MED ORDER — POTASSIUM CHLORIDE CRYS ER 20 MEQ PO TBCR: 20.0000 meq | EXTENDED_RELEASE_TABLET | Freq: Every day | ORAL | Status: DC

## 2013-04-01 NOTE — Telephone Encounter (Signed)
Cholesterol is above goal LDL <130 BUT BETTER THAN LAST CHECK. triglycerides high, HDl is good. Blood sugar elevated.. Prediabetes. HIGHER THAN LAST CHECK. Her potassium is even lower than before due to the chlorthalidone.. We will need to have her start potassium 20 mEq daily and return in 1 week for Potassium check Dx low potassium.  Sent in prescription,and lab order.

## 2013-04-02 NOTE — Telephone Encounter (Signed)
Left message for patient to call back  

## 2013-04-04 ENCOUNTER — Emergency Department: Payer: Self-pay | Admitting: Emergency Medicine

## 2013-04-04 NOTE — Telephone Encounter (Signed)
Pt called to get lab results; pt said there was no message left on her phone but she did see caller ID where [place] had called earlier in the week. Pt did not know she needed to start K. Pt said this morning pt was taken by ambulance to [facility] ED; pt had sharp pain on lt side of head BP 180/108 P 103. Pt concerned having a stroke. [facility] did CT scan which was OK; pt is scheduled to see vascular specialist. I requested ED records from [facility] via fax. Pt picked up K and Metoprolol today and pt will start taking K today. Pt scheduled lab appt 04/10/13 @ 8:45 am. Pt said since home she feels better than when went to ED this morning.

## 2013-04-04 NOTE — Telephone Encounter (Signed)
Noted  

## 2013-04-10 ENCOUNTER — Other Ambulatory Visit (INDEPENDENT_AMBULATORY_CARE_PROVIDER_SITE_OTHER): Payer: Medicare Other

## 2013-04-10 DIAGNOSIS — E876 Hypokalemia: Secondary | ICD-10-CM

## 2013-04-10 LAB — POTASSIUM: Potassium: 3.7 mEq/L (ref 3.5–5.1)

## 2013-06-04 ENCOUNTER — Ambulatory Visit (INDEPENDENT_AMBULATORY_CARE_PROVIDER_SITE_OTHER)
Admission: RE | Admit: 2013-06-04 | Discharge: 2013-06-04 | Disposition: A | Discharge status: Home / Self Care | Payer: Medicare Other | Source: Ambulatory Visit | Attending: Family Medicine | Admitting: Family Medicine

## 2013-06-04 ENCOUNTER — Ambulatory Visit (INDEPENDENT_AMBULATORY_CARE_PROVIDER_SITE_OTHER): Payer: Medicare Other | Admitting: Family Medicine

## 2013-06-04 ENCOUNTER — Encounter: Payer: Self-pay | Admitting: Family Medicine

## 2013-06-04 ENCOUNTER — Telehealth: Payer: Self-pay | Admitting: Family Medicine

## 2013-06-04 VITALS — BP 120/74 | HR 63 | Temp 98.2°F | Ht 61.0 in | Wt 182.5 lb

## 2013-06-04 DIAGNOSIS — M25571 Pain in right ankle and joints of right foot: Secondary | ICD-10-CM

## 2013-06-04 DIAGNOSIS — M25579 Pain in unspecified ankle and joints of unspecified foot: Secondary | ICD-10-CM

## 2013-06-04 NOTE — Progress Notes (Signed)
Nature conservation officer at [facility] [address] [place] [address] Phone: [phone] Fax: [phone]  Date:  06/04/2013   Name:  Kristy [name]   DOB:  [birth year]   MRN:  [id number] Gender: female Age: 72 y.o.  Primary Physician:  Kerby Nora, MD  Evaluating MD: Hannah Beat, MD   Chief Complaint: Foot Swelling   History of Present Illness:  Kristy [name] is a 72 y.o. pleasant patient who presents with the following:  R foot pain / swelling.  She was at work, banged her foot, and hit with packet - diapers.  Now it is swollen quite a bit and swollen across the forefoot.  NT around the ankle.  Walking ok.  Patient Active Problem List   Diagnosis Date Noted  . Acute bronchitis 03/07/2013  . Allergic rhinitis 10/04/2012  . Breast mass, right 09/10/2012  . Syncope 07/06/2011  . OSTEOPENIA 09/14/2010  . PREDIABETES 09/14/2010  . LUMBOSACRAL STRAIN 03/30/2010  . ONYCHOMYCOSIS 08/12/2009  . HYPERTENSION, BENIGN ESSENTIAL, LABILE 02/15/2009  . LOW BACK PAIN, CHRONIC 02/01/2009  . HYPERPLASIA, FIBROMUSCULAR 08/27/2008  . HYPERTHYROIDISM 08/28/2007  . HYPERLIPIDEMIA 08/28/2007  . 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 GERD 08/28/2007  . IRRITABLE BOWEL SYNDROME 08/28/2007    Past Medical History  Diagnosis Date  . Hypertension     For 16 years; urinary catecholamines within normal limits 12/09; renal Doppler ultrasound showed no evidence for renal artery stenosis  . 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 Earnestine Leys of vascular surgery in [place]  . Tinnitus of left ear   . Hyperlipidemia   . Normal echocardiogram 4/10    LVEF 65%; no regional wall motion abnormalities; normal RV size and function; pulmonic valve had increased gradient across w/ peak gradient of about 36 mmHg (range of moderate pulmonic stenosis) followup showed normal valve  . GERD (gastroesophageal reflux disease)   .  Allergy   . Arthritis   . Fibromuscular dysplasia     Past Surgical History  Procedure Laterality Date  . Cardiolyte  11/07    Neg  . Total abdominal hysterectomy      no cervix  . Angioplasty      2012  . Knee cartilage surgery      right knee    History   Social History  . Marital Status: Widowed    Spouse Name: N/A    Number of Children: 0  . Years of Education: N/A   Occupational History  . Radiographer, therapeutic     Retired   Social History Main Topics  . Smoking status: Former Games developer  . Smokeless tobacco: Never Used     Comment: 15 pack year history  . Alcohol Use: Yes     Comment: Wine every 2 weeks  . Drug Use: No  . Sexually Active:    Other Topics Concern  . Not on file   Social History Narrative   Widowed in 2007-spouse died from colon cancer.   Regular exercise at [facility].   Diet consists of fruits and veggies, snacks a lot.    No living will, HCPOA none (reviewed 2013)    Family History  Problem Relation Age of Onset  . Prostate cancer Father   . Coronary artery disease Paternal Grandmother   . Goiter Other     ?  . Colon cancer Maternal Uncle   . Colon cancer Paternal Uncle   . Esophageal cancer Neg  Hx   . Stomach cancer Neg Hx   . Rectal cancer Neg Hx     No Known Allergies  Medication list has been reviewed and updated.  Outpatient Prescriptions Prior to Visit  Medication Sig Dispense Refill  . aspirin 81 MG EC tablet Take 81 mg by mouth daily.        . chlorthalidone (HYGROTON) 25 MG tablet Take 1 tablet (25 mg total) by mouth daily.  90 tablet  3  . cholecalciferol (VITAMIN D) 1000 UNITS tablet Take 1,000 Units by mouth daily.      . metoprolol (LOPRESSOR) 100 MG tablet TAKE 1 TABLET BY MOUTH 2 TIMES A DAY  60 tablet  5  . Multiple Vitamin (MULTIVITAMIN) tablet Take 1 tablet by mouth daily.      . potassium chloride SA (K-DUR,KLOR-CON) 20 MEQ tablet Take 1 tablet (20 mEq total) by mouth daily.  30 tablet  3  . azithromycin (ZITHROMAX)  250 MG tablet 2 tab po x 1 day then 1 tab po daily  6 tablet  0  . guaiFENesin-codeine (ROBITUSSIN AC) 100-10 MG/5ML syrup Take 5-10 mLs by mouth at bedtime as needed for cough.  180 mL  0  . metoprolol succinate (TOPROL-XL) 50 MG 24 hr tablet Take 50 mg by mouth daily. Take with or immediately following a meal.       No facility-administered medications prior to visit.    Review of Systems:   GEN: No fevers, chills. Nontoxic. Primarily MSK c/o today. MSK: Detailed in the HPI GI: tolerating PO intake without difficulty Neuro: No numbness, parasthesias, or tingling associated. Otherwise the pertinent positives of the ROS are noted above.    Physical Examination: BP 120/74  Pulse 63  Temp(Src) 98.2 F (36.8 C) (Oral)  Ht 5\' 1"  (1.549 m)  Wt 182 lb 8 oz (82.781 kg)  BMI 34.5 kg/m2  SpO2 97%  Ideal Body Weight: Weight in (lb) to have BMI = 25: 132   GEN: WDWN, NAD, Non-toxic, Alert & Oriented x 3 HEENT: Atraumatic, Normocephalic.  Ears and Nose: No external deformity. EXTR: No clubbing/cyanosis/edema NEURO: Normal gait.  PSYCH: Normally interactive. Conversant. Not depressed or anxious appearing.  Calm demeanor.   FEET: r Echymosis: no Edema: no ROM: full LE B Gait: heel toe, non-antalgic MT pain: pain mild across 2-4 Callus pattern: none Lateral Mall: NT Medial Mall: NT Talus: NT Navicular: NT Cuboid: NT Calcaneous: NT Metatarsals: NT 5th MT: NT Phalanges: NT Achilles: NT Plantar Fascia: NT Fat Pad: NT Peroneals: NT Post Tib: NT Great Toe: Nml motion Ant Drawer: neg ATFL: NT CFL: NT Deltoid: NT Sensation: intact   Dg Foot Complete Right  06/04/2013   *RADIOLOGY REPORT*  Clinical Data: Post-traumatic forefoot pain.  RIGHT FOOT COMPLETE - 3+ VIEW  Comparison: None.  Findings: The mineralization and alignment are normal.  There is no evidence of acute fracture or dislocation.  There is joint space loss and osteophyte formation at the first metatarsal  phalangeal joint.  There is mild subchondral cyst formation within the first metatarsal head.  The tibial sesamoid of the first metatarsal is bipartite. There is a small plantar calcaneal spur.  There may be mild dorsal forefoot soft tissue swelling.  IMPRESSION: No acute osseous findings.  Degenerative changes at the first metatarsal phalangeal joint.   Original Report Authenticated By: Carey Bullocks, M.D.    Assessment and Plan: Pain in joint, ankle and foot, right - Plan: DG Foot Complete Right   No fracture  Soft tissue and bone bruise  Expect resolution in approx 3 weeks  Signed, Melva Faux T. Saber Dickerman, MD 06/04/2013 3:45 PM

## 2013-06-04 NOTE — Telephone Encounter (Signed)
Patient Information:  Caller Name: Zhanna  Phone: [phone]  Patient: Kristy [name], Kristy [name]  Gender: Female  DOB: [birth year]  Age: 72 Years  PCP: Kerby Nora (Family Practice)  Office Follow Up:  Does the office need to follow up with this patient?: No  Instructions For The Office: N/A   Symptoms  Reason For Call & Symptoms: Rt foot pain. Dropped something light on in a week ago.  Reviewed Health History In EMR: Yes  Reviewed Medications In EMR: Yes  Reviewed Allergies In EMR: Yes  Reviewed Surgeries / Procedures: Yes  Date of Onset of Symptoms: 05/28/2013  Treatments Tried: Meloxicam  Treatments Tried Worked: No  Guideline(s) Used:  Foot Pain  Ankle and Foot Injury  Disposition Per Guideline:   See Today in Office  Reason For Disposition Reached:   Patient wants to be seen  Advice Given:  N/A  Patient Will Follow Care Advice:  YES  Appointment Scheduled:  06/04/2013 15:45:00 Appointment Scheduled Provider:  Hannah Beat (Family Practice)

## 2013-06-10 ENCOUNTER — Telehealth: Payer: Self-pay

## 2013-06-10 NOTE — Telephone Encounter (Signed)
Pt was seen 06/04/13; pt said right foot and now right ankle is very swollen; No pain. Swelling goes down but does not go away while pt has foot up during the night. Pt wants to know the cause of the swelling and what can be done for it. Advised pt when sitting to elevate foot. Pt voiced understanding and request cb.Please advise.[facility].

## 2013-06-10 NOTE — Telephone Encounter (Signed)
I am not that surprised that she intermittent worsening with some swelling. She may not be getting adequate immobilization and rest and may need more to rest her foot.   See if she can some in tomorrow sometime and fit her for a post-op shoe or the short CAM walker.  If she has a lot of questions, I am happy to reexamine her tomorrow.

## 2013-06-10 NOTE — Telephone Encounter (Signed)
Patient advised and will come in tomorrow

## 2013-06-11 ENCOUNTER — Ambulatory Visit: Payer: Medicare Other | Admitting: Family Medicine

## 2013-06-16 NOTE — Telephone Encounter (Signed)
Very reasonable to see foot doctor --- i will be out a lot on vacation in the next 2 weeks, so I would see them since she has appointment tomorrow.

## 2013-06-16 NOTE — Telephone Encounter (Signed)
Pt did not come to office on 06/11/13 wanted to see if swelling would go down; pt said rt foot swelling is better; still having swelling on top of rt foot and top of rt foot hurts when bends toes downward. Pt has already made appt with foot doctor 06/17/13 at 11 am and pt wants Dr Brayton Layman opinion if needs to see foot doctor or schedule appt with Dr Patsy Lager.Please advise.

## 2013-06-17 NOTE — Telephone Encounter (Signed)
Pt states that she has cancelled appt with foot doctor and will give it until Friday before she calls them for another appt.  She states swelling has improved.

## 2013-06-27 ENCOUNTER — Other Ambulatory Visit: Payer: Self-pay | Admitting: *Deleted

## 2013-06-27 MED ORDER — METOPROLOL TARTRATE 100 MG PO TABS: ORAL_TABLET | ORAL | Status: DC

## 2013-07-30 ENCOUNTER — Ambulatory Visit (INDEPENDENT_AMBULATORY_CARE_PROVIDER_SITE_OTHER): Payer: Medicare Other | Admitting: Family Medicine

## 2013-07-30 ENCOUNTER — Encounter: Payer: Self-pay | Admitting: Family Medicine

## 2013-07-30 VITALS — BP 120/82 | HR 68 | Temp 98.4°F | Ht 61.0 in | Wt 176.8 lb

## 2013-07-30 DIAGNOSIS — M25579 Pain in unspecified ankle and joints of unspecified foot: Secondary | ICD-10-CM

## 2013-07-30 DIAGNOSIS — M25571 Pain in right ankle and joints of right foot: Secondary | ICD-10-CM

## 2013-07-30 NOTE — Progress Notes (Signed)
Nature conservation officer at [facility] [address] [place] [address] Phone: [phone] Fax: [phone]  Date:  07/30/2013   Name:  Kristy [name]   DOB:  03/21/1941   MRN:  [id number] Gender: female Age: 72 y.o.  Primary Physician:  Kerby Nora, MD  Evaluating MD: Hannah Beat, MD   Chief Complaint: Swollen right foot   History of Present Illness:  Kristy [name] is a 72 y.o. pleasant patient who presents with the following:  I am seeing the patient in follow-up after initially seeing her 2 months ago, and at that time I thought she had more of a soft tissue injury, had her wear a stiff shoe, but she did not do well. She called me, and I was going to go out of town, so I had her see one of the podiatrists. Dr. Peggye Fothergill did f/u xrays of the foot which were negative. She then wore a CAM walker boot for about 1 week, but she has had persistent swelling, and actually her foot is doing quite a bit worse compared to 2 months ago with more pain and swelling now.  Patient Active Problem List   Diagnosis Date Noted  . Acute bronchitis 03/07/2013  . Allergic rhinitis 10/04/2012  . Breast mass, right 09/10/2012  . Syncope 07/06/2011  . OSTEOPENIA 09/14/2010  . PREDIABETES 09/14/2010  . LUMBOSACRAL STRAIN 03/30/2010  . ONYCHOMYCOSIS 08/12/2009  . HYPERTENSION, BENIGN ESSENTIAL, LABILE 02/15/2009  . LOW BACK PAIN, CHRONIC 02/01/2009  . HYPERPLASIA, FIBROMUSCULAR 08/27/2008  . HYPERTHYROIDISM 08/28/2007  . HYPERLIPIDEMIA 08/28/2007  . 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 GERD 08/28/2007  . IRRITABLE BOWEL SYNDROME 08/28/2007    Past Medical History  Diagnosis Date  . Hypertension     For 16 years; urinary catecholamines within normal limits 12/09; renal Doppler ultrasound showed no evidence for renal artery stenosis  . 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 Earnestine Leys of vascular surgery in [place]  .  Tinnitus of left ear   . Hyperlipidemia   . Normal echocardiogram 4/10    LVEF 65%; no regional wall motion abnormalities; normal RV size and function; pulmonic valve had increased gradient across w/ peak gradient of about 36 mmHg (range of moderate pulmonic stenosis) followup showed normal valve  . GERD (gastroesophageal reflux disease)   . Allergy   . Arthritis   . Fibromuscular dysplasia     Past Surgical History  Procedure Laterality Date  . Cardiolyte  11/07    Neg  . Total abdominal hysterectomy      no cervix  . Angioplasty      2012  . Knee cartilage surgery      right knee    History   Social History  . Marital Status: Widowed    Spouse Name: N/A    Number of Children: 0  . Years of Education: N/A   Occupational History  . Radiographer, therapeutic     Retired   Social History Main Topics  . Smoking status: Former Games developer  . Smokeless tobacco: Never Used     Comment: 15 pack year history  . Alcohol Use: Yes     Comment: Wine every 2 weeks  . Drug Use: No  . Sexual Activity: Not on file   Other Topics Concern  . Not on file   Social History Narrative   Widowed in 2007-spouse died from colon cancer.   Regular exercise at [facility].  Diet consists of fruits and veggies, snacks a lot.    No living will, HCPOA none (reviewed 2013)    Family History  Problem Relation Age of Onset  . Prostate cancer Father   . Coronary artery disease Paternal Grandmother   . Goiter Other     ?  . Colon cancer Maternal Uncle   . Colon cancer Paternal Uncle   . Esophageal cancer Neg Hx   . Stomach cancer Neg Hx   . Rectal cancer Neg Hx     No Known Allergies  Medication list has been reviewed and updated.  Outpatient Prescriptions Prior to Visit  Medication Sig Dispense Refill  . aspirin 81 MG EC tablet Take 81 mg by mouth daily.        . Biotin 10 MG TABS Take 1 tablet by mouth daily.      . cholecalciferol (VITAMIN D) 1000 UNITS tablet Take 1,000 Units by mouth daily.       . metoprolol (LOPRESSOR) 100 MG tablet TAKE 1 TABLET BY MOUTH 2 TIMES A DAY  180 tablet  0  . Probiotic Product (PROBIOTIC DAILY PO) Take 2 capsules by mouth daily.      . vitamin E 1000 UNIT capsule Take 1,000 Units by mouth daily.      . chlorthalidone (HYGROTON) 25 MG tablet Take 1 tablet (25 mg total) by mouth daily.  90 tablet  3  . potassium chloride SA (K-DUR,KLOR-CON) 20 MEQ tablet Take 1 tablet (20 mEq total) by mouth daily.  30 tablet  3  . Multiple Vitamin (MULTIVITAMIN) tablet Take 1 tablet by mouth daily.       No facility-administered medications prior to visit.    Review of Systems:   GEN: No fevers, chills. Nontoxic. Primarily MSK c/o today. MSK: Detailed in the HPI GI: tolerating PO intake without difficulty Neuro: No numbness, parasthesias, or tingling associated. Otherwise the pertinent positives of the ROS are noted above.    Physical Examination: BP 120/82  Pulse 68  Temp(Src) 98.4 F (36.9 C) (Oral)  Ht 5\' 1"  (1.549 m)  Wt 176 lb 12 oz (80.173 kg)  BMI 33.41 kg/m2  Ideal Body Weight: Weight in (lb) to have BMI = 25: 132  GEN: WDWN, NAD, Non-toxic, Alert & Oriented x 3 HEENT: Atraumatic, Normocephalic.   Ears and Nose: No external deformity. EXTR: No clubbing/cyanosis/edema NEURO: antalgic gait.   PSYCH: Normally interactive. Conversant. Not depressed or anxious appearing.  Calm demeanor.   FEET: r Echymosis: no Edema: NOTABLE DORSAL EDEMA WORSE THAN INITIAL EVAL ROM: full LE B, SOME LIMITED ON R AND PAIN Gait: heel toe, PAINFUL ON R MT pain: pain across 2-4, MORE ON 2-3 IN THE MIDSHAFT Lateral Mall: NT Medial Mall: NT Talus: NT Navicular: NT Cuboid: NT Calcaneous: NT Metatarsals: AS ABOVE 5th MT: NT Phalanges: NT Achilles: NT Plantar Fascia: NT Fat Pad: NT Peroneals: NT Post Tib: NT Great Toe: DECREASED MOTION AND CREPITUS Ant Drawer: neg ATFL: NT CFL: NT Deltoid: NT Sensation: intact   Dg Foot Complete Right  06/04/2013    *RADIOLOGY REPORT*  Clinical Data: Post-traumatic forefoot pain.  RIGHT FOOT COMPLETE - 3+ VIEW  Comparison: None.  Findings: The mineralization and alignment are normal.  There is no evidence of acute fracture or dislocation.  There is joint space loss and osteophyte formation at the first metatarsal phalangeal joint.  There is mild subchondral cyst formation within the first metatarsal head.  The tibial sesamoid of the first metatarsal is  bipartite. There is a small plantar calcaneal spur.  There may be mild dorsal forefoot soft tissue swelling.  IMPRESSION: No acute osseous findings.  Degenerative changes at the first metatarsal phalangeal joint.   Original Report Authenticated By: Carey Bullocks, M.D.     Assessment and Plan:  Pain in joint, ankle and foot, right - Plan: MR Foot Right Wo Contrast  Patient is deteriorating, clearly in more pain and worse over 2 months. No fracture seen on xray. Obtain an MRI of the Right foot without contrast. Strongly suspect metatarsal stress fracture equivalent, potentially in multiple bones in a 72 year old osteopenic patient. Midfoot bone stress fracture or stress reaction cannot be excluded. Advanced imaging needed to direct type and length of immobilization.   Orders Today:  Orders Placed This Encounter  Procedures  . MR Foot Right Wo Contrast    WT-176/NOT CLAUS/PREV XR IN EPIC/NO NEEDS PER OFFICE/EPIC ORDER/[facility] COMP    Standing Status: Future     Number of Occurrences:      Standing Expiration Date: 09/29/2014    Order Specific Question:  Reason for exam:    Answer:  concern for metatarsal stress fracture    Order Specific Question:  Preferred imaging location?    Answer:  [id number]    Order Specific Question:  Does the patient have a pacemaker, internal devices, implants, aneury    Answer:  No    Updated Medication List: (Includes new medications, updates to list, dose adjustments) Meds ordered this encounter    Medications  . COD LIVER OIL PO    Sig: Take 1 capsule by mouth daily.  . chlorthalidone (HYGROTON) 25 MG tablet    Sig: Take 25 mg by mouth as needed.  . potassium chloride SA (K-DUR,KLOR-CON) 20 MEQ tablet    Sig: Take 20 mEq by mouth as needed.    Medications Discontinued: Medications Discontinued During This Encounter  Medication Reason  . potassium chloride SA (K-DUR,KLOR-CON) 20 MEQ tablet   . chlorthalidone (HYGROTON) 25 MG tablet       Signed, Davine Sweney T. Tommy Minichiello, MD 07/30/2013 10:40 AM

## 2013-07-30 NOTE — Patient Instructions (Addendum)
REFERRAL: GO THE THE FRONT ROOM AT THE ENTRANCE OF OUR CLINIC, NEAR CHECK IN. ASK FOR MARION. SHE WILL HELP YOU SET UP YOUR REFERRAL. DATE: TIME:  

## 2013-08-01 ENCOUNTER — Other Ambulatory Visit: Payer: Medicare Other

## 2013-08-02 ENCOUNTER — Other Ambulatory Visit: Payer: Self-pay | Admitting: Family Medicine

## 2013-08-02 DIAGNOSIS — M25571 Pain in right ankle and joints of right foot: Secondary | ICD-10-CM

## 2013-08-03 ENCOUNTER — Ambulatory Visit
Admission: RE | Admit: 2013-08-03 | Discharge: 2013-08-03 | Disposition: A | Discharge status: Home / Self Care | Payer: Medicare Other | Source: Ambulatory Visit | Attending: Family Medicine | Admitting: Family Medicine

## 2013-08-03 ENCOUNTER — Inpatient Hospital Stay
Admission: RE | Admit: 2013-08-03 | Discharge: 2013-08-03 | Disposition: A | Discharge status: Home / Self Care | Payer: Medicare Other | Source: Ambulatory Visit | Attending: Family Medicine | Admitting: Family Medicine

## 2013-08-03 DIAGNOSIS — M25571 Pain in right ankle and joints of right foot: Secondary | ICD-10-CM

## 2013-08-13 ENCOUNTER — Ambulatory Visit (INDEPENDENT_AMBULATORY_CARE_PROVIDER_SITE_OTHER): Payer: Medicare Other | Admitting: Family Medicine

## 2013-08-13 ENCOUNTER — Encounter: Payer: Self-pay | Admitting: Family Medicine

## 2013-08-13 VITALS — BP 140/80 | HR 62 | Temp 98.6°F | Ht 61.0 in | Wt 175.5 lb

## 2013-08-13 DIAGNOSIS — Z23 Encounter for immunization: Secondary | ICD-10-CM

## 2013-08-13 DIAGNOSIS — IMO0002 Reserved for concepts with insufficient information to code with codable children: Secondary | ICD-10-CM

## 2013-08-13 MED ORDER — SULFAMETHOXAZOLE-TMP DS 800-160 MG PO TABS: 2.0000 | ORAL_TABLET | Freq: Two times a day (BID) | ORAL | Status: DC

## 2013-08-13 NOTE — Progress Notes (Signed)
Nature conservation officer at [facility] [address] [place] [address] Phone: [phone] Fax: [phone]  Date:  08/13/2013   Name:  Kristy [name]   DOB:  Nov 10, 1941   MRN:  [id number] Gender: female Age: 72 y.o.  Primary Physician:  Kerby Nora, MD  Evaluating MD: Hannah Beat, MD  Chief Complaint: Boil on shoulder   History of Present Illness:  Kristy [name] is a 72 y.o. very pleasant female patient who presents with the following:  R shoulder, boil: she had an abscess here 18 months ago that we had to I and D. Over the last 2 days it has become painful and reddened. Same area as prior abscess. No trauma. No significant warmth.  I also reviewed her MRI, compliant with CAM and feeling better.   EXAM: MRI OF THE RIGHT FOREFOOT WITHOUT CONTRAST   TECHNIQUE: Multiplanar, multisequence MR imaging was performed. No intravenous contrast was administered.   COMPARISON:  Radiographs 06/04/2013.   FINDINGS: No metatarsal stress fracture is identified. There are mild age related degenerative changes but no significant bony findings. The tibiotalar, subtalar and midfoot joint spaces are fairly well maintained with mild degenerative changes. No significant joint effusions and no osteochondral abnormalities.   There is a longitudinal split type tear involving the peroneus brevis tendon in part due to a peroneus quartus muscle causing pressure on the peroneal compartment behind the fibula.   Tendinopathy and short segment longitudinal split type tear involving the posterior tibialis tendon.   The medial and lateral ankle ligaments are intact.   The sinus tarsi is normal. The Achilles tendon and and plantar fascia are intact. Mild calcaneal spurring change. The foot musculature appears normal.   IMPRESSION: Fairly extensive longitudinal split type tear involving the peroneus brevis tendon but no complete rupture.   Short segment longitudinal split type  tear and tendinopathy involving posterior tibialis tendon.   No acute bony findings. No metatarsal stress fracture or osteochondral abnormality.   Intact medial and lateral ankle ligaments.     Electronically Signed   By: Loralie Champagne M.D.   On: 08/03/2013 13:13   Past Medical History, Surgical History, Social History, Family History, Problem List, Medications, and Allergies have been reviewed and updated if relevant.  Current Outpatient Prescriptions on File Prior to Visit  Medication Sig Dispense Refill  . aspirin 81 MG EC tablet Take 81 mg by mouth daily.        . Biotin 10 MG TABS Take 1 tablet by mouth daily.      . chlorthalidone (HYGROTON) 25 MG tablet Take 25 mg by mouth as needed.      . cholecalciferol (VITAMIN D) 1000 UNITS tablet Take 1,000 Units by mouth daily.      . COD LIVER OIL PO Take 1 capsule by mouth daily.      . Multiple Vitamin (MULTIVITAMIN) tablet Take 1 tablet by mouth daily.      . potassium chloride SA (K-DUR,KLOR-CON) 20 MEQ tablet Take 20 mEq by mouth as needed.      . Probiotic Product (PROBIOTIC DAILY PO) Take 2 capsules by mouth daily.      . vitamin E 1000 UNIT capsule Take 1,000 Units by mouth daily.       No current facility-administered medications on file prior to visit.    Review of Systems: ROS: GEN: Acute illness details above GI: Tolerating PO intake GU: maintaining adequate hydration and urination Pulm: No SOB Interactive and getting along well  at home.  Otherwise, ROS is as per the HPI.   Physical Examination: BP 140/80  Pulse 62  Temp(Src) 98.6 F (37 C) (Oral)  Ht 5\' 1"  (1.549 m)  Wt 175 lb 8 oz (79.606 kg)  BMI 33.18 kg/m2   GEN: WDWN, NAD, Non-toxic, Alert & Oriented x 3 HEENT: Atraumatic, Normocephalic.  Ears and Nose: No external deformity. EXTR: No clubbing/cyanosis/edema NEURO: Normal gait.  PSYCH: Normally interactive. Conversant. Not depressed or anxious appearing.  Calm demeanor.   SKIN: R shoulder,  erythema, half-dollar size, no fluctuance.  Assessment and Plan:  Abscess or cellulitis of shoulder  Need for prophylactic vaccination and inoculation against influenza - Plan: Flu Vaccine QUAD 36+ mos PF IM (Fluarix)  Most c/w abscess, moist compresses, f/u if worsens.  PT and peroneal long tears improving f/u 4 weeks  Orders Today:  Orders Placed This Encounter  Procedures  . Flu Vaccine QUAD 36+ mos PF IM (Fluarix)    Updated Medication List: (Includes new medications, updates to list, dose adjustments) Meds ordered this encounter  Medications  . metoprolol (LOPRESSOR) 100 MG tablet    Sig: Take 100 mg by mouth daily. TAKE 1 TABLET BY MOUTH 2 TIMES A DAY  . sulfamethoxazole-trimethoprim (BACTRIM DS) 800-160 MG per tablet    Sig: Take 2 tablets by mouth 2 (two) times daily.    Dispense:  20 tablet    Refill:  0    Medications Discontinued: Medications Discontinued During This Encounter  Medication Reason  . metoprolol (LOPRESSOR) 100 MG tablet       Signed, Azarion Hove T. Kawena Lyday, MD 08/13/2013 10:40 AM

## 2013-08-14 ENCOUNTER — Encounter: Payer: Self-pay | Admitting: Gastroenterology

## 2013-08-27 ENCOUNTER — Ambulatory Visit: Payer: Medicare Other | Admitting: Family Medicine

## 2013-09-29 ENCOUNTER — Other Ambulatory Visit: Payer: Self-pay | Admitting: Family Medicine

## 2013-09-29 ENCOUNTER — Ambulatory Visit: Payer: Medicare Other | Admitting: Family Medicine

## 2013-09-29 DIAGNOSIS — Z0289 Encounter for other administrative examinations: Secondary | ICD-10-CM

## 2013-09-29 DIAGNOSIS — N63 Unspecified lump in unspecified breast: Secondary | ICD-10-CM

## 2013-10-09 ENCOUNTER — Ambulatory Visit (AMBULATORY_SURGERY_CENTER): Payer: Medicare Other | Admitting: *Deleted

## 2013-10-09 VITALS — Ht 62.0 in | Wt 174.0 lb

## 2013-10-09 DIAGNOSIS — Z1211 Encounter for screening for malignant neoplasm of colon: Secondary | ICD-10-CM

## 2013-10-09 MED ORDER — MOVIPREP 100 G PO SOLR: ORAL | Status: DC

## 2013-10-09 NOTE — Progress Notes (Signed)
No allergies to eggs or soy. No problems with anesthesia.  

## 2013-10-10 ENCOUNTER — Encounter: Payer: Self-pay | Admitting: Gastroenterology

## 2013-10-17 ENCOUNTER — Ambulatory Visit
Admission: RE | Admit: 2013-10-17 | Discharge: 2013-10-17 | Disposition: A | Discharge status: Home / Self Care | Payer: Medicare Other | Source: Ambulatory Visit | Attending: Family Medicine | Admitting: Family Medicine

## 2013-10-17 ENCOUNTER — Other Ambulatory Visit: Payer: Self-pay | Admitting: Family Medicine

## 2013-10-17 DIAGNOSIS — N63 Unspecified lump in unspecified breast: Secondary | ICD-10-CM

## 2013-10-20 DIAGNOSIS — D126 Benign neoplasm of colon, unspecified: Secondary | ICD-10-CM

## 2013-10-20 HISTORY — DX: Benign neoplasm of colon, unspecified: D12.6

## 2013-10-23 ENCOUNTER — Ambulatory Visit (AMBULATORY_SURGERY_CENTER): Payer: Medicare Other | Admitting: Gastroenterology

## 2013-10-23 ENCOUNTER — Encounter: Payer: Self-pay | Admitting: Gastroenterology

## 2013-10-23 VITALS — BP 155/90 | HR 71 | Temp 96.8°F | Resp 22 | Ht 62.0 in | Wt 174.0 lb

## 2013-10-23 DIAGNOSIS — Z1211 Encounter for screening for malignant neoplasm of colon: Secondary | ICD-10-CM

## 2013-10-23 DIAGNOSIS — D126 Benign neoplasm of colon, unspecified: Secondary | ICD-10-CM

## 2013-10-23 MED ORDER — SODIUM CHLORIDE 0.9 % IV SOLN: 500.0000 mL | INTRAVENOUS | Status: DC

## 2013-10-23 NOTE — Patient Instructions (Signed)

## 2013-10-23 NOTE — Progress Notes (Signed)
Report to pacu rn, vss, bbs=clear 

## 2013-10-23 NOTE — Progress Notes (Signed)
Pt alert on arrival to the recovery room. Maw  Pt was given 2 partial to place in her mouth.  maw

## 2013-10-23 NOTE — Progress Notes (Signed)
Called to room to assist during endoscopic procedure.  Patient ID and intended procedure confirmed with present staff. Received instructions for my participation in the procedure from the performing physician.  

## 2013-10-23 NOTE — Progress Notes (Signed)
No complaints noted in the recovery room. Maw   

## 2013-10-23 NOTE — Op Note (Addendum)
[facility] [address] [place], [address]   COLONOSCOPY PROCEDURE REPORT PATIENT: Kristy [name], Kristy [name]  MR#: [id number] BIRTHDATE: 1941-07-13 , 72  yrs. old GENDER: Female ENDOSCOPIST: Meryl Dare, MD, [facility] REFERRED BY:Amy Michelle Nasuti, M.D. PROCEDURE DATE:  10/23/2013 PROCEDURE:   Colonoscopy with biopsy and snare polypectomy First Screening Colonoscopy - Avg.  risk and is 50 yrs.  old or older Yes.  Prior Negative Screening - Now for repeat screening. N/A  History of Adenoma - Now for follow-up colonoscopy & has been > or = to 3 yrs.  N/A  Polyps Removed Today? Yes. ASA CLASS:   Class II INDICATIONS:average risk screening. MEDICATIONS: MAC sedation, administered by CRNA and propofol (Diprivan) 350mg  IV DESCRIPTION OF PROCEDURE:   After the risks benefits and alternatives of the procedure were thoroughly explained, informed consent was obtained.  A digital rectal exam revealed no abnormalities of the rectum.   The LB [id number]  endoscope was introduced through the anus and advanced to the cecum, which was identified by both the appendix and ileocecal valve. No adverse events experienced with a tortuous colon, moderately difficult intubation, suspected adhesions.   The quality of the prep was excellent, using MoviPrep  The instrument was then slowly withdrawn as the colon was fully examined.  COLON FINDINGS: Two sessile polyps measuring 4 mm in size were found at the cecum and in the ascending colon.  A polypectomy was performed with cold forceps.  The resection was complete and the polyp tissue was completely retrieved.   A sessile polyp measuring 6 mm in size was found at the hepatic flexure.  A polypectomy was performed with a cold snare.  The resection was complete and the polyp tissue was completely retrieved.   Four sessile polyps measuring 3-4 mm in size were found in the sigmoid colon.  A polypectomy was performed with a cold snare.  The  resection was complete and the polyp tissue was completely retrieved.   Five sessile polyps measuring 3 mm in size were found in the rectum.  A polypectomy was performed with cold forceps.  The resection was complete and the polyp tissue was completely retrieved.   Mild diverticulosis was noted in the sigmoid colon.   The colon was otherwise normal.  There was no diverticulosis, inflammation, polyps or cancers unless previously stated.  Retroflexed views revealed moderate internal hemorrhoids. The time to cecum=9 minutes 17 seconds.  Withdrawal time=17 minutes 40 seconds.  The scope was withdrawn and the procedure completed. COMPLICATIONS: There were no complications.  ENDOSCOPIC IMPRESSION: 1.   Two sessile polyps measuring 4 mm at the cecum and in the ascending colon; polypectomy performed with cold forceps 2.   Sessile polyp measuring 6 mm at the hepatic flexure; polypectomy performed with a cold snare 3.   Four sessile polyps measuring 3-4 mm in the sigmoid colon; polypectomy performed with a cold snare 4.   Five sessile polyps measuring 3 mm in the rectum; polypectomy performed with cold forceps 5.   Mild diverticulosis was noted in the sigmoid colon 6.   Moderate internal hemorrhoids  RECOMMENDATIONS: 1.  Await pathology results 2.  High fiber diet with liberal fluid intake. 3.  Repeat colonoscopy in 3 years if 4 or more polyp adenomatous; 5 years if 1-3 adenomatous: otherwise 10 years   eSigned:  Meryl Dare, MD, [facility] 10/23/2013 10:00 AM Revised: 10/23/2013 10:00 AM     PATIENT NAME:  Kristy [name], Kristy [name] MR#: [id number]

## 2013-10-23 NOTE — Progress Notes (Signed)
No egg or soy allergy. ewm Long ago with hysterectomy was hard to wake but no other issues. ewm

## 2013-10-24 ENCOUNTER — Telehealth: Payer: Self-pay

## 2013-10-24 NOTE — Telephone Encounter (Signed)
  Follow up Call-  Call back number 10/23/2013  Post procedure Call Back phone  # [phone]  Permission to leave phone message Yes     Patient questions:  Do you have a fever, pain , or abdominal swelling? no Pain Score  0 *  Have you tolerated food without any problems? yes  Have you been able to return to your normal activities? yes  Do you have any questions about your discharge instructions: Diet   no Medications  no Follow up visit  no  Do you have questions or concerns about your Care? no  Actions: * If pain score is 4 or above: No action needed, pain <4.

## 2013-10-28 ENCOUNTER — Encounter: Payer: Self-pay | Admitting: Gastroenterology

## 2013-11-03 ENCOUNTER — Other Ambulatory Visit: Payer: Self-pay | Admitting: Family Medicine

## 2013-11-05 ENCOUNTER — Telehealth: Payer: Self-pay | Admitting: Family Medicine

## 2013-11-05 NOTE — Telephone Encounter (Signed)
Patient Information:  Caller Name: Mozell  Phone: [phone]  Patient: Kristy [name], Kristy [name]  Gender: Female  DOB: August 07, 1941  Age: 72 Years  PCP: Kerby Nora (Family Practice)  Office Follow Up:  Does the office need to follow up with this patient?: No  Instructions For The Office: N/A  RN Note:  Intermittent chest tightness for 2 days, not present now. Hydrate and humidify.  Call if chest tightness occurs again.  Symptoms  Reason For Call & Symptoms: Nasal congestion with occasional, slightly green, productive cough and ear congestion  Reviewed Health History In EMR: Yes  Reviewed Medications In EMR: Yes  Reviewed Allergies In EMR: Yes  Reviewed Surgeries / Procedures: Yes  Date of Onset of Symptoms: 11/01/2013  Treatments Tried: nasal saline  Treatments Tried Worked: Yes  Guideline(s) Used:  Colds  Disposition Per Guideline:   Home Care  Reason For Disposition Reached:   Colds with no complications  Advice Given:  Reassurance  It sounds like an uncomplicated cold that we can treat at home.  Colds are very common and may make you feel uncomfortable.  Colds are caused by viruses, and no medicine or "shot" will cure an uncomplicated cold.  Colds are usually not serious.  For a Runny Nose With Profuse Discharge:   Nasal mucus and discharge helps to wash viruses and bacteria out of the nose and sinuses.  Blowing the nose is all that is needed.  For a Stuffy Nose - Use Nasal Washes:  Introduction: Saline (salt water) nasal irrigation (nasal wash) is an effective and simple home remedy for treating stuffy nose and sinus congestion. The nose can be irrigated by pouring, spraying, or squirting salt water into the nose and then letting it run back out.  Humidifier:  If the air in your home is dry, use a cool-mist humidifier  Contagiousness:  The cold virus is present in your nasal secretions.  Cover your nose and mouth with a tissue when you sneeze or cough.  Wash your  hands frequently with soap and water.  You can return to work or school after the fever is gone and you feel well enough to participate in normal activities.  Expected Course:   Fever may last 2-3 days  Nasal discharge 7-14 days  Cough up to 2-3 weeks.  Call Back If:  Difficulty breathing occurs  Fever lasts more than 3 days  Nasal discharge lasts more than 10 days  Cough lasts more than 3 weeks  You become worse  Patient Will Follow Care Advice:  YES

## 2013-11-05 NOTE — Telephone Encounter (Signed)
Noted  

## 2013-11-06 NOTE — Telephone Encounter (Signed)
Noted  

## 2013-11-12 ENCOUNTER — Encounter: Payer: Self-pay | Admitting: Family Medicine

## 2013-11-12 ENCOUNTER — Ambulatory Visit (INDEPENDENT_AMBULATORY_CARE_PROVIDER_SITE_OTHER): Payer: Medicare Other | Admitting: Family Medicine

## 2013-11-12 VITALS — BP 136/70 | HR 66 | Temp 98.1°F | Ht 62.0 in | Wt 172.2 lb

## 2013-11-12 DIAGNOSIS — M76899 Other specified enthesopathies of unspecified lower limb, excluding foot: Secondary | ICD-10-CM

## 2013-11-12 DIAGNOSIS — M7061 Trochanteric bursitis, right hip: Secondary | ICD-10-CM

## 2013-11-12 DIAGNOSIS — G5702 Lesion of sciatic nerve, left lower limb: Secondary | ICD-10-CM

## 2013-11-12 DIAGNOSIS — M7062 Trochanteric bursitis, left hip: Secondary | ICD-10-CM

## 2013-11-12 DIAGNOSIS — M6790 Unspecified disorder of synovium and tendon, unspecified site: Secondary | ICD-10-CM

## 2013-11-12 DIAGNOSIS — G5700 Lesion of sciatic nerve, unspecified lower limb: Secondary | ICD-10-CM

## 2013-11-12 DIAGNOSIS — M67952 Unspecified disorder of synovium and tendon, left thigh: Secondary | ICD-10-CM

## 2013-11-12 DIAGNOSIS — M25559 Pain in unspecified hip: Secondary | ICD-10-CM

## 2013-11-12 DIAGNOSIS — M25552 Pain in left hip: Secondary | ICD-10-CM

## 2013-11-12 MED ORDER — DICLOFENAC SODIUM 75 MG PO TBEC: 75.0000 mg | DELAYED_RELEASE_TABLET | Freq: Two times a day (BID) | ORAL | Status: DC

## 2013-11-12 NOTE — Progress Notes (Signed)
Date:  11/12/2013   Name:  Kristy [name]   DOB:  05-02-1941   MRN:  [id number] Gender: female Age: 72 y.o.  Primary Physician:  Kerby Nora, MD   Chief Complaint: Hip Pain   Subjective:   History of Present Illness:  Kristy [name] is a 72 y.o. pleasant patient who presents with the following:  He is a lady who has been having left-sided posterior hip pain and some radiating pain down the LEFT side of her leg for about the last week. She has had some sciatica in the past but has been sometime since bothered her. She has not had any specific injury, she has been picking things up or lifting more since his Christmas. She denies groin pain. She is not having significant back pain. Mostly it is in the buttocks region on the LEFT.  Left side all the way to the ankle and having a toohache.  Has been ongoing for a week. Out shopping a lot and it will hurt. Does get this a lot and it will go and come.  Cut back on exercise since knee surgery.  Patient Active Problem List   Diagnosis Date Noted  . Acute bronchitis 03/07/2013  . Allergic rhinitis 10/04/2012  . Breast mass, right 09/10/2012  . Syncope 07/06/2011  . OSTEOPENIA 09/14/2010  . PREDIABETES 09/14/2010  . LUMBOSACRAL STRAIN 03/30/2010  . ONYCHOMYCOSIS 08/12/2009  . HYPERTENSION, BENIGN ESSENTIAL, LABILE 02/15/2009  . LOW BACK PAIN, CHRONIC 02/01/2009  . HYPERPLASIA, FIBROMUSCULAR 08/27/2008  . HYPERTHYROIDISM 08/28/2007  . HYPERLIPIDEMIA 08/28/2007  . 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 GERD 08/28/2007  . IRRITABLE BOWEL SYNDROME 08/28/2007    Past Medical History  Diagnosis Date  . Hypertension     For 16 years; urinary catecholamines within normal limits 12/09; renal Doppler ultrasound showed no evidence for renal artery stenosis  . 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 Earnestine Leys of vascular surgery in [place]  . Tinnitus of left ear   .  Hyperlipidemia   . Normal echocardiogram 4/10    LVEF 65%; no regional wall motion abnormalities; normal RV size and function; pulmonic valve had increased gradient across w/ peak gradient of about 36 mmHg (range of moderate pulmonic stenosis) followup showed normal valve  . GERD (gastroesophageal reflux disease)   . Allergy   . Arthritis   . Fibromuscular dysplasia     Past Surgical History  Procedure Laterality Date  . Cardiolyte  11/07    Neg  . Total abdominal hysterectomy      no cervix  . Angioplasty      2012  . Knee cartilage surgery      right knee    History   Social History  . Marital Status: Widowed    Spouse Name: N/A    Number of Children: 0  . Years of Education: N/A   Occupational History  . Radiographer, therapeutic     Retired   Social History Main Topics  . Smoking status: Former Smoker    Quit date: 11/20/1982  . Smokeless tobacco: Never Used     Comment: 15 pack year history  . Alcohol Use: Yes     Comment: Wine every 2 weeks  . Drug Use: No  . Sexual Activity: Not on file   Other Topics Concern  . Not on file   Social History Narrative   Widowed in 2007-spouse died from colon cancer.   Regular  exercise at [facility].   Diet consists of fruits and veggies, snacks a lot.    No living will, HCPOA none (reviewed 2013)    Family History  Problem Relation Age of Onset  . Prostate cancer Father   . Coronary artery disease Paternal Grandmother   . Goiter Other     ?  . Colon cancer Maternal Uncle   . Colon cancer Paternal Uncle   . Esophageal cancer Neg Hx   . Stomach cancer Neg Hx   . Rectal cancer Neg Hx     No Known Allergies  Medication list has been reviewed and updated.  Review of Systems:  GEN: No fevers, chills. Nontoxic. Primarily MSK c/o today. MSK: Detailed in the HPI GI: tolerating PO intake without difficulty Neuro: No numbness, parasthesias, or tingling associated. Otherwise the pertinent positives of the ROS are noted above.     Objective:   Physical Examination: BP 136/70  Pulse 66  Temp(Src) 98.1 F (36.7 C) (Oral)  Ht 5\' 2"  (1.575 m)  Wt 172 lb 4 oz (78.132 kg)  BMI 31.50 kg/m2  SpO2 98%  Ideal Body Weight: Weight in (lb) to have BMI = 25: 136.4   GEN: WDWN, NAD, Non-toxic, Alert & Oriented x 3 HEENT: Atraumatic, Normocephalic.  Ears and Nose: No external deformity. EXTR: No clubbing/cyanosis/edema NEURO: Normal gait.  PSYCH: Normally interactive. Conversant. Not depressed or anxious appearing.  Calm demeanor.   HIP EXAM: SIDE: L ROM: Abduction, Flexion, Internal and External range of motion: full Pain with terminal IROM and EROM: mild IROM term pain GTB: mild B SLR: NEG Knees: No effusion FABER: NT REVERSE FABER: + L Piriformis: TTP at direct palpation Str: flexion: 4+/5 abduction: 4-/5 adduction: 5/5 Strength testing non-tender      Assessment & Plan:    Piriformis syndrome of left side  Left hip pain - Plan: CANCELED: DG Hip Complete Left  Trochanteric bursitis of both hips  Tendinopathy of left gluteus medius  I will markedly weight on the LEFT and essentially all hip muscles. Recent issues with her LEFT foot likely contribute. Mild trochanteric bursitis. I think that the primary problem is markedly tender piriformis which is causing some piriformis syndrome and sciatic nerve irritation.  Reviewed hip rehabilitation from the [employer] of [employer]. Followup in 6 weeks if not improved.  There are no Patient Instructions on file for this visit.  Orders Today:  No orders of the defined types were placed in this encounter.    New medications, updates to list, dose adjustments: Meds ordered this encounter  Medications  . diclofenac (VOLTAREN) 75 MG EC tablet    Sig: Take 1 tablet (75 mg total) by mouth 2 (two) times daily.    Dispense:  60 tablet    Refill:  3    Signed,  Halford Goetzke T. Bernese Doffing, MD, CAQ Sports Medicine  [facility] [address] [place] [address] Phone: [phone] Fax: [phone]  Updated Complete Medication List:   Medication List       This list is accurate as of: 11/12/13  3:13 PM.  Always use your most recent med list.               aspirin 81 MG EC tablet  Take 81 mg by mouth daily.     chlorthalidone 25 MG tablet  Commonly known as:  HYGROTON  Take 25 mg by mouth as needed.     cholecalciferol 1000 UNITS tablet  Commonly known as:  VITAMIN D  Take 1,000 Units by mouth daily.     COD LIVER OIL PO  Take 1 capsule by mouth daily.     CRANBERRY EXTRACT PO  Take by mouth daily.     diclofenac 75 MG EC tablet  Commonly known as:  VOLTAREN  Take 1 tablet (75 mg total) by mouth 2 (two) times daily.     metoprolol 100 MG tablet  Commonly known as:  LOPRESSOR  TAKE 1 TABLET BY MOUTH 2 TIMES A DAY     multivitamin tablet  Take 1 tablet by mouth daily.     vitamin E 1000 UNIT capsule  Take 1,000 Units by mouth daily.

## 2013-11-12 NOTE — Progress Notes (Signed)
Pre-visit discussion using our clinic review tool. No additional management support is needed unless otherwise documented below in the visit note.  

## 2013-11-17 ENCOUNTER — Telehealth: Payer: Self-pay

## 2013-11-17 MED ORDER — TRAMADOL HCL 50 MG PO TABS: 50.0000 mg | ORAL_TABLET | Freq: Three times a day (TID) | ORAL | Status: DC | PRN

## 2013-11-17 NOTE — Telephone Encounter (Signed)
Tramadol Rx faxed to [facility].  Left message for patient to return my call.

## 2013-11-17 NOTE — Telephone Encounter (Signed)
Pt left v/m; was seen 11/12/13 with sciatica on lt leg; pt having a lot of pain especially when tries to walk. Pain med is not helping. Pt wants to know if needs to stay off her leg and pt request stronger pain med.[facility]. Pt request cb.

## 2013-11-17 NOTE — Telephone Encounter (Signed)
We can have her try tramadol for pain .  Otherwise make follow up with Dr. Patsy Lager if not improving in next 1-2 weeks.

## 2013-11-18 NOTE — Telephone Encounter (Signed)
Pt called back and notified as instructed. Pt will cb if no improvement.

## 2013-12-16 ENCOUNTER — Encounter: Payer: Self-pay | Admitting: Family Medicine

## 2013-12-16 ENCOUNTER — Ambulatory Visit (INDEPENDENT_AMBULATORY_CARE_PROVIDER_SITE_OTHER): Payer: Medicare Other | Admitting: Family Medicine

## 2013-12-16 VITALS — BP 120/80 | HR 59 | Temp 98.0°F | Ht 62.0 in | Wt 175.5 lb

## 2013-12-16 DIAGNOSIS — M5432 Sciatica, left side: Secondary | ICD-10-CM | POA: Insufficient documentation

## 2013-12-16 DIAGNOSIS — G5700 Lesion of sciatic nerve, unspecified lower limb: Secondary | ICD-10-CM

## 2013-12-16 DIAGNOSIS — M5430 Sciatica, unspecified side: Secondary | ICD-10-CM

## 2013-12-16 DIAGNOSIS — R82998 Other abnormal findings in urine: Secondary | ICD-10-CM

## 2013-12-16 DIAGNOSIS — R3129 Other microscopic hematuria: Secondary | ICD-10-CM | POA: Insufficient documentation

## 2013-12-16 DIAGNOSIS — G5702 Lesion of sciatic nerve, left lower limb: Secondary | ICD-10-CM

## 2013-12-16 DIAGNOSIS — R8290 Unspecified abnormal findings in urine: Secondary | ICD-10-CM

## 2013-12-16 LAB — POCT URINALYSIS DIPSTICK
Bilirubin, UA: NEGATIVE
Glucose, UA: NEGATIVE
Ketones, UA: NEGATIVE
Leukocytes, UA: NEGATIVE
Nitrite, UA: POSITIVE
Spec Grav, UA: >=1.03
Urobilinogen, UA: 0.2
pH, UA: 6

## 2013-12-16 LAB — POCT UA - MICROSCOPIC ONLY

## 2013-12-16 MED ORDER — MELOXICAM 7.5 MG PO TABS: 7.5000 mg | ORAL_TABLET | Freq: Every day | ORAL | Status: DC

## 2013-12-16 NOTE — Progress Notes (Signed)
Pre-visit discussion using our clinic review tool. No additional management support is needed unless otherwise documented below in the visit note.  

## 2013-12-16 NOTE — Addendum Note (Signed)
Addended by: Carter Kitten on: 12/16/2013 09:33 AM   Modules accepted: Orders

## 2013-12-16 NOTE — Assessment & Plan Note (Signed)
Refer to PT, start NSAID, heat. No clear sign of hip pathology but if not improving in 2 weeks.. Consider back and hip films.

## 2013-12-16 NOTE — Patient Instructions (Addendum)
Schedule a medicare wellness with fasting labs prior. Start meloxicam daily. Start prilosec 20 mg daily over the counter. Stop at front desk for PT. We will call with urine culture results. Make an appt in 2 weeks if not improving.

## 2013-12-16 NOTE — Progress Notes (Signed)
Subjective:    Patient ID: Kristy [name], female    DOB: 1941/01/02, 73 y.o.   MRN: [id number]  HPI  73 year old female presents with multiple complaints including:  1. urinary odor ongoing for 2 weeks. She has tried drinking more water. No urinary urgency, no frequency , no dysuria.  2.  hip pain : pain in left posterior hip, right buttock, radiates to lower leg. Ongoing in last 1-2 months. Sharp pain at times. She has not had any specific injury, she has been picking things up or lifting more since his Christmas. Occ radiation to anterior groin pain. She is not having significant back pain. Mostly it is in the buttocks region on the LEFT.  She saw Dr. Lorelei Pont in 10/2013... Thought piriformis syndrome and sciatic nerve irritation. Given diclofenac but did not tolerate due to GI upset, did home PT.  Using tylenol prn pain, increase with moving walking.  Asper cream does not help. No pain with lying on left side.  Unable to exercise. She has had bunion on left foot, causing her to walk differently. She is seeing Dr. Paulla Dolly for this.Marland Kitchen He recommend bunionectomy.   Review of Systems  Constitutional: Negative for fever and fatigue.  HENT: Negative for ear pain.   Eyes: Negative for pain.  Respiratory: Negative for chest tightness and shortness of breath.   Cardiovascular: Negative for chest pain, palpitations and leg swelling.  Gastrointestinal: Negative for abdominal pain.  Genitourinary: Negative for dysuria.       Objective:   Physical Exam  Constitutional: Vital signs are normal. She appears well-developed and well-nourished. She is cooperative.  Non-toxic appearance. She does not appear ill. No distress.  HENT:  Head: Normocephalic.  Right Ear: Hearing, tympanic membrane, external ear and ear canal normal. Tympanic membrane is not erythematous, not retracted and not bulging.  Left Ear: Hearing, tympanic membrane, external ear and ear canal normal. Tympanic membrane is  not erythematous, not retracted and not bulging.  Nose: No mucosal edema or rhinorrhea. Right sinus exhibits no maxillary sinus tenderness and no frontal sinus tenderness. Left sinus exhibits no maxillary sinus tenderness and no frontal sinus tenderness.  Mouth/Throat: Uvula is midline, oropharynx is clear and moist and mucous membranes are normal.  Eyes: Conjunctivae, EOM and lids are normal. Pupils are equal, round, and reactive to light. Lids are everted and swept, no foreign bodies found.  Neck: Trachea normal and normal range of motion. Neck supple. Carotid bruit is not present. No mass and no thyromegaly present.  Cardiovascular: Normal rate, regular rhythm, S1 normal, S2 normal, normal heart sounds, intact distal pulses and normal pulses.  Exam reveals no gallop and no friction rub.   No murmur heard. Pulmonary/Chest: Effort normal and breath sounds normal. Not tachypneic. No respiratory distress. She has no decreased breath sounds. She has no wheezes. She has no rhonchi. She has no rales.  Abdominal: Soft. Normal appearance and bowel sounds are normal. There is no tenderness.  Musculoskeletal:       Left hip: She exhibits tenderness. She exhibits normal range of motion, normal strength and no bony tenderness.       Lumbar back: She exhibits normal range of motion, no tenderness and no bony tenderness.  Neg SLR  ttp over sciatic notch on left  Neurological: She is alert.  Skin: Skin is warm, dry and intact. No rash noted.  Psychiatric: Her speech is normal and behavior is normal. Judgment and thought content normal. Her mood appears not  anxious. Cognition and memory are normal. She does not exhibit a depressed mood.          Assessment & Plan:

## 2013-12-16 NOTE — Assessment & Plan Note (Addendum)
No typical urinary symptoms... Will send for culture.  She has had full work up for blood in urine in past... idiopathic

## 2013-12-18 ENCOUNTER — Telehealth: Payer: Self-pay

## 2013-12-18 NOTE — Telephone Encounter (Signed)
Pt request cb when gets urine culture results.

## 2013-12-18 NOTE — Telephone Encounter (Signed)
Final not back, awaiting sensitivities

## 2013-12-19 LAB — URINE CULTURE: Colony Count: >=100000

## 2013-12-19 MED ORDER — CIPROFLOXACIN HCL 250 MG PO TABS: 250.0000 mg | ORAL_TABLET | Freq: Two times a day (BID) | ORAL | Status: DC

## 2013-12-19 NOTE — Telephone Encounter (Signed)
Patient notified urine culture did show she has a urinary tract infection.  Prescription for Cipro 250 mg sent to [facility] (see result note).

## 2013-12-19 NOTE — Addendum Note (Signed)
Addended by: Carter Kitten on: 12/19/2013 08:35 AM   Modules accepted: Orders

## 2013-12-25 ENCOUNTER — Telehealth: Payer: Self-pay | Admitting: Family Medicine

## 2013-12-25 DIAGNOSIS — G5702 Lesion of sciatic nerve, left lower limb: Secondary | ICD-10-CM

## 2013-12-25 DIAGNOSIS — M5432 Sciatica, left side: Secondary | ICD-10-CM

## 2013-12-25 MED ORDER — HYDROCODONE-ACETAMINOPHEN 10-325 MG PO TABS: 1.0000 | ORAL_TABLET | Freq: Three times a day (TID) | ORAL | Status: DC | PRN

## 2013-12-25 NOTE — Telephone Encounter (Signed)
Patient notified as instructed by telephone.  Would like to move forward with the Orthopedic Referral.  Advised [name] will call her with that appointment once she gets it scheduled for her.

## 2013-12-25 NOTE — Telephone Encounter (Signed)
Kristy [name] called back to say she looked up Vicodin on the Internet and there was no way she was going to take that.  States she will just increase her Meloxicam to one tablet in the morning and one a night.  Prescription for Vicodin shredded.

## 2013-12-25 NOTE — Telephone Encounter (Signed)
Pt says you gave her some meds for pain on her left side down her hip and leg. She says the pain is becoming excruciating and the meds are not helping.  Pt would like for you to order an MRI or X-Ray to determine exactly what the pain is.  If someone calls pt prior to 3:00 then call her at work [phone] ext [phone]. After 3, please call her cell [phone]. Thank you.

## 2013-12-25 NOTE — Telephone Encounter (Signed)
Will print vicodin for breakthrough pain.. But would recommend referral to [facility] if she is agreeable to assess further.

## 2013-12-26 NOTE — Telephone Encounter (Signed)
That is fine. 15 mg / day max of meloxicam. How does she feel about ortho referral?

## 2013-12-26 NOTE — Telephone Encounter (Signed)
Would like to move forward with the Orthopedic Referral.

## 2013-12-26 NOTE — Addendum Note (Signed)
Addended by: Eliezer Lofts E on: 12/26/2013 01:43 PM   Modules accepted: Orders

## 2014-01-01 ENCOUNTER — Telehealth: Payer: Self-pay | Admitting: Family Medicine

## 2014-01-01 DIAGNOSIS — R3129 Other microscopic hematuria: Secondary | ICD-10-CM

## 2014-01-01 DIAGNOSIS — E785 Hyperlipidemia, unspecified: Secondary | ICD-10-CM

## 2014-01-01 DIAGNOSIS — M899 Disorder of bone, unspecified: Secondary | ICD-10-CM

## 2014-01-01 DIAGNOSIS — I10 Essential (primary) hypertension: Secondary | ICD-10-CM

## 2014-01-01 DIAGNOSIS — E0590 Thyrotoxicosis, unspecified without thyrotoxic crisis or storm: Secondary | ICD-10-CM

## 2014-01-01 DIAGNOSIS — M949 Disorder of cartilage, unspecified: Secondary | ICD-10-CM

## 2014-01-01 DIAGNOSIS — R7309 Other abnormal glucose: Secondary | ICD-10-CM

## 2014-01-01 NOTE — Telephone Encounter (Signed)
Message copied by Jinny Sanders on Thu Jan 01, 2014  5:35 PM ------      Message from: Ellamae Sia      Created: Wed Dec 31, 2013  6:28 PM      Regarding: Lab orders for Friday, 2.13.15       Patient is scheduled for CPX labs, please order future labs, Thanks , Terri       ------

## 2014-01-02 ENCOUNTER — Other Ambulatory Visit (INDEPENDENT_AMBULATORY_CARE_PROVIDER_SITE_OTHER): Payer: Medicare Other

## 2014-01-02 DIAGNOSIS — E785 Hyperlipidemia, unspecified: Secondary | ICD-10-CM

## 2014-01-02 DIAGNOSIS — E0590 Thyrotoxicosis, unspecified without thyrotoxic crisis or storm: Secondary | ICD-10-CM

## 2014-01-02 DIAGNOSIS — I10 Essential (primary) hypertension: Secondary | ICD-10-CM

## 2014-01-02 DIAGNOSIS — M949 Disorder of cartilage, unspecified: Secondary | ICD-10-CM

## 2014-01-02 DIAGNOSIS — R7309 Other abnormal glucose: Secondary | ICD-10-CM

## 2014-01-02 DIAGNOSIS — M899 Disorder of bone, unspecified: Secondary | ICD-10-CM

## 2014-01-02 DIAGNOSIS — R3129 Other microscopic hematuria: Secondary | ICD-10-CM

## 2014-01-02 LAB — CBC WITH DIFFERENTIAL/PLATELET
Basophils Absolute: 0 10*3/uL (ref 0.0–0.1)
Basophils Relative: 0.5 % (ref 0.0–3.0)
Eosinophils Absolute: 0.2 10*3/uL (ref 0.0–0.7)
Eosinophils Relative: 3.8 % (ref 0.0–5.0)
HCT: 38.7 % (ref 36.0–46.0)
Hemoglobin: 12.6 g/dL (ref 12.0–15.0)
Lymphocytes Relative: 43.3 % (ref 12.0–46.0)
Lymphs Abs: 2.6 10*3/uL (ref 0.7–4.0)
MCHC: 32.7 g/dL (ref 30.0–36.0)
MCV: 98 fl (ref 78.0–100.0)
Monocytes Absolute: 0.4 10*3/uL (ref 0.1–1.0)
Monocytes Relative: 7.2 % (ref 3.0–12.0)
Neutro Abs: 2.7 10*3/uL (ref 1.4–7.7)
Neutrophils Relative %: 45.2 % (ref 43.0–77.0)
Platelets: 262 10*3/uL (ref 150.0–400.0)
RBC: 3.95 Mil/uL (ref 3.87–5.11)
RDW: 13.6 % (ref 11.5–14.6)
WBC: 5.9 10*3/uL (ref 4.5–10.5)

## 2014-01-02 LAB — HEMOGLOBIN A1C: Hgb A1c MFr Bld: 6.1 % (ref 4.6–6.5)

## 2014-01-02 LAB — LIPID PANEL
Cholesterol: 200 mg/dL (ref 0–200)
HDL: 47 mg/dL (ref >39.00)
LDL Cholesterol: 135 mg/dL — ABNORMAL HIGH (ref 0–99)
Total CHOL/HDL Ratio: 4
Triglycerides: 89 mg/dL (ref 0.0–149.0)
VLDL: 17.8 mg/dL (ref 0.0–40.0)

## 2014-01-02 LAB — COMPREHENSIVE METABOLIC PANEL
ALT: 14 U/L (ref 0–35)
AST: 16 U/L (ref 0–37)
Albumin: 3.7 g/dL (ref 3.5–5.2)
Alkaline Phosphatase: 57 U/L (ref 39–117)
BUN: 13 mg/dL (ref 6–23)
CO2: 24 mEq/L (ref 19–32)
Calcium: 9 mg/dL (ref 8.4–10.5)
Chloride: 106 mEq/L (ref 96–112)
Creatinine, Ser: 0.7 mg/dL (ref 0.4–1.2)
GFR: 113.67 mL/min (ref >60.00)
Glucose, Bld: 106 mg/dL — ABNORMAL HIGH (ref 70–99)
Potassium: 3.5 mEq/L (ref 3.5–5.1)
Sodium: 140 mEq/L (ref 135–145)
Total Bilirubin: 0.6 mg/dL (ref 0.3–1.2)
Total Protein: 7.1 g/dL (ref 6.0–8.3)

## 2014-01-02 LAB — TSH: TSH: 1.61 u[IU]/mL (ref 0.35–5.50)

## 2014-01-03 LAB — VITAMIN D 25 HYDROXY (VIT D DEFICIENCY, FRACTURES): Vit D, 25-Hydroxy: 46 ng/mL (ref 30–89)

## 2014-01-09 ENCOUNTER — Encounter: Payer: Self-pay | Admitting: Family Medicine

## 2014-01-09 ENCOUNTER — Ambulatory Visit (INDEPENDENT_AMBULATORY_CARE_PROVIDER_SITE_OTHER): Payer: Medicare Other | Admitting: Family Medicine

## 2014-01-09 VITALS — BP 124/80 | HR 66 | Temp 98.5°F | Ht 62.5 in | Wt 174.8 lb

## 2014-01-09 DIAGNOSIS — E785 Hyperlipidemia, unspecified: Secondary | ICD-10-CM

## 2014-01-09 DIAGNOSIS — I10 Essential (primary) hypertension: Secondary | ICD-10-CM

## 2014-01-09 DIAGNOSIS — E0590 Thyrotoxicosis, unspecified without thyrotoxic crisis or storm: Secondary | ICD-10-CM

## 2014-01-09 DIAGNOSIS — Z1231 Encounter for screening mammogram for malignant neoplasm of breast: Secondary | ICD-10-CM

## 2014-01-09 DIAGNOSIS — E2839 Other primary ovarian failure: Secondary | ICD-10-CM

## 2014-01-09 DIAGNOSIS — Z0000 Encounter for general adult medical examination without abnormal findings: Secondary | ICD-10-CM

## 2014-01-09 DIAGNOSIS — I7789 Other specified disorders of arteries and arterioles: Secondary | ICD-10-CM

## 2014-01-09 DIAGNOSIS — Z23 Encounter for immunization: Secondary | ICD-10-CM

## 2014-01-09 MED ORDER — METOPROLOL SUCCINATE ER 100 MG PO TB24: 100.0000 mg | ORAL_TABLET | Freq: Every day | ORAL | Status: DC

## 2014-01-09 NOTE — Assessment & Plan Note (Signed)
Improving control. 

## 2014-01-09 NOTE — Assessment & Plan Note (Signed)
Will set up new vascular MD on her own. Will call if referral needed.

## 2014-01-09 NOTE — Progress Notes (Signed)
Pre visit review using our clinic review tool, if applicable. No additional management support is needed unless otherwise documented below in the visit note. 

## 2014-01-09 NOTE — Patient Instructions (Addendum)
Change metoprolol 12 hour twice daily  to metoprolol 24 hour once daily.  Follow BP on this different longer acting med.  Low carbohydrate diet.  Work on exercise and weight loss. Stop at front desk to scheduled mammo and DEXA.

## 2014-01-09 NOTE — Assessment & Plan Note (Signed)
TSH stable.  

## 2014-01-09 NOTE — Progress Notes (Signed)
I have personally reviewed the Medicare Annual Wellness questionnaire and have noted  1. The patient's medical and social history  2. Their use of alcohol, tobacco or illicit drugs  3. Their current medications and supplements  4. The patient's functional ability including ADL's, fall risks, home safety risks and hearing or visual  impairment.  5. Diet and physical activities  6. Evidence for depression or mood disorders  The patients weight, height, BMI and visual acuity have been recorded in the chart  I have made referrals, counseling and provided education to the patient based review of the above and I have provided the pt with a written personalized care plan for preventive services.    Hypertension. Well controlled at home but she occ has BP spikes up to 190/140 . Seems tio improve with relaxation. If she takes metoprolol twice daily BP is better controlled. Has history of fibromuscular dysplasia  But not renal fibromuscular dysplasia. No flushing associated with Bp increase.. Just headache  In back. BP Readings from Last 3 Encounters:  01/09/14 124/80  12/16/13 120/80  11/12/13 136/70  Using medication without problems or lightheadedness:  None Chest pain with exertion:None Edema:None Short of breath:None Average home BPs: See above Other issues:  Elevated Cholesterol: LDL now almost  at goal < 130 on no medication. She has been working some on diet changes since.  Lab Results  Component Value Date   CHOL 200 01/02/2014   HDL 47.00 01/02/2014   LDLCALC 135* 01/02/2014   LDLDIRECT 140.3 03/26/2013   TRIG 89.0 01/02/2014   CHOLHDL 4 01/02/2014  Diet compliance: Has tried to avoid fried foods.  Exercise: Other complaints:   Prediabetes: stable.  Thyroid stable. Lab Results  Component Value Date   TSH 1.61 01/02/2014   Using elliptical weekly.  Piriformis syndrome:  Off and on pain, gradually improving over time. Meloxicam did not help.  Not interested in vicodin for  pain.  Using heat for pain.  Has pending ortho referral.  Review of Systems  Constitutional: Negative for fever, fatigue and unexpected weight change.  HENT: Negative for ear pain, congestion, sore throat, sneezing, trouble swallowing and sinus pressure.  Eyes: Negative for pain and itching.  Respiratory: negative for shortness of breath. Negative for cough and wheezing.  Cardiovascular: Negative for chest pain, palpitations and leg swelling.  Gastrointestinal: Negative for nausea, abdominal pain, diarrhea, constipation and blood in stool.  Genitourinary: Negative for dysuria, hematuria, vaginal discharge, difficulty urinating and menstrual problem.  Skin: Negative for rash.  Neurological: Negative for syncope, weakness, light-headedness, numbness and headaches.  Psychiatric/Behavioral: Negative for confusion and dysphoric mood. The patient is not nervous/anxious.  Objective:   Physical Exam  Constitutional: Vital signs are normal. She appears well-developed and well-nourished. She is cooperative. Non-toxic appearance. She does not appear ill. No distress.  HENT:  Head: Normocephalic.  Right Ear: Hearing, tympanic membrane, external ear and ear canal normal.  Left Ear: Hearing, tympanic membrane, external ear and ear canal normal.  Nose: Nose normal.  Eyes: Conjunctivae, EOM and lids are normal. Pupils are equal, round, and reactive to light. No foreign bodies found.  Neck: Trachea normal and normal range of motion. Neck supple. Carotid bruit is not present. No mass and no thyromegaly present.  Cardiovascular: Normal rate, regular rhythm, S1 normal, S2 normal, normal heart sounds and intact distal pulses. Exam reveals no gallop.  No murmur heard.  Pulmonary/Chest: Effort normal and breath sounds normal. No respiratory distress. She has no wheezes. She has no  rhonchi. She has no rales.  Abdominal: Soft. Normal appearance and bowel sounds are normal. She exhibits no distension, no fluid  wave, no abdominal bruit and no mass. There is no hepatosplenomegaly. There is no tenderness. There is no rebound, no guarding and no CVA tenderness. No hernia.  Genitourinary: No breast swelling, tenderness, discharge or bleeding.  Lymphadenopathy:  She has no cervical adenopathy.  She has no axillary adenopathy.  Neurological: She is alert. She has normal strength. No cranial nerve deficit or sensory deficit.  Skin: Skin is warm, dry and intact. No rash noted.  Psychiatric: Her speech is normal and behavior is normal. Judgment normal. Her mood appears not anxious. Cognition and memory are normal. She does not exhibit a depressed mood.  Assessment & Plan:   The patient's preventative maintenance and recommended screening tests for an annual wellness exam were reviewed in full today.  Brought up to date unless services declined.  Counselled on the importance of diet, exercise, and its role in overall health and mortality.  The patient's FH and SH was reviewed, including their home life, tobacco status, and drug and alcohol status.   Vaccines: uptodate Td and Pneumovax, considering shingles, prevnar Colon:10/2013, Dr. Fuller Plan,  Repeat in 5 years. DVE/pap: total hysterectomy  Mammo: 09/2013 right,  Now due for bilateral.. Will schedule DEXA:overdue. She is now sexually active. Not interested in STD testing.

## 2014-01-09 NOTE — Addendum Note (Signed)
Addended by: Carter Kitten on: 01/09/2014 05:00 PM   Modules accepted: Orders

## 2014-01-09 NOTE — Assessment & Plan Note (Signed)
Change to long acting metoprolol for ease of use.

## 2014-01-12 ENCOUNTER — Telehealth: Payer: Self-pay | Admitting: Family Medicine

## 2014-01-12 NOTE — Telephone Encounter (Signed)
Relevant patient education assigned to patient using Emmi. ° °

## 2014-01-29 ENCOUNTER — Ambulatory Visit
Admission: RE | Admit: 2014-01-29 | Discharge: 2014-01-29 | Disposition: A | Discharge status: Home / Self Care | Payer: Medicare Other | Source: Ambulatory Visit | Attending: Family Medicine | Admitting: Family Medicine

## 2014-01-29 DIAGNOSIS — Z1231 Encounter for screening mammogram for malignant neoplasm of breast: Secondary | ICD-10-CM

## 2014-01-29 DIAGNOSIS — E2839 Other primary ovarian failure: Secondary | ICD-10-CM

## 2014-01-30 ENCOUNTER — Telehealth: Payer: Self-pay

## 2014-01-30 DIAGNOSIS — I7789 Other specified disorders of arteries and arterioles: Secondary | ICD-10-CM

## 2014-01-30 NOTE — Telephone Encounter (Signed)
Pt request referral to vascular specialist affiliated with [facility] in [place];pt still having BP spikes; discussed with Dr Diona Browner at 01/09/14 visit. Pt could not schedule on her own.

## 2014-02-04 ENCOUNTER — Encounter: Payer: Self-pay | Admitting: Family Medicine

## 2014-02-07 ENCOUNTER — Ambulatory Visit: Payer: Self-pay | Admitting: Physical Medicine and Rehabilitation

## 2014-02-13 ENCOUNTER — Ambulatory Visit: Payer: Medicare Other | Admitting: Family Medicine

## 2014-02-24 ENCOUNTER — Ambulatory Visit: Payer: Medicare Other | Admitting: Family Medicine

## 2014-02-26 ENCOUNTER — Ambulatory Visit: Payer: Medicare Other | Admitting: Family Medicine

## 2014-02-27 ENCOUNTER — Other Ambulatory Visit: Payer: Medicare Other

## 2014-03-06 ENCOUNTER — Encounter: Payer: Medicare Other | Admitting: Family Medicine

## 2014-03-27 ENCOUNTER — Telehealth: Payer: Self-pay | Admitting: Family Medicine

## 2014-03-27 NOTE — Telephone Encounter (Signed)
Left message on answering machine   prednisone can increase BP and cause insomnia. Make sure taking in AM. She is likely on taper and will not be on this long term. Pt to take metoprolol BID and chlorthalidone regularly ( she takes often prn).  If other questions or issues continuing.. Pt to call back on MOnday or over weekend if more urgent. I am on call this weekend.

## 2014-03-27 NOTE — Telephone Encounter (Signed)
Patient Information:  Caller Name: Sama  Phone: [phone]  Patient: Kristy [name], Kristy [name]  Gender: Female  DOB: 07-24-1941  Age: 73 Years  PCP: Eliezer Lofts (Family Practice)  Office Follow Up:  Does the office need to follow up with this patient?: Yes  Instructions For The Office: Please follow up with her regarding question related to Prednisone.   Symptoms  Reason For Call & Symptoms: Patient calling about Prednisone; states she spoke with [facility] She is on 5 tablets daily; she feels poorly and is concerned about her BP.  She was given Rx for disk pressing on nerve and causing her pain.    She is not able to sleep at night due to medication.  BP at work  150/95,  Emergent symptoms ruled out.  See within 2 Weeks in Office per High Blood Pressure guideline due to BP > 140/90 and is taking BP medications.  Upgraded to Discuss with PCP and callback today per nursing judgment.  Reviewed Health History In EMR: Yes  Reviewed Medications In EMR: Yes  Reviewed Allergies In EMR: Yes  Reviewed Surgeries / Procedures: Yes  Date of Onset of Symptoms: 03/22/2014  Guideline(s) Used:  High Blood Pressure  Disposition Per Guideline:   See Within 2 Weeks in Office  Reason For Disposition Reached:   BP > 140/90 and is not taking BP medications  Advice Given:  General:  Untreated high blood pressure may cause damage to the heart, brain, kidneys, and eyes.  Treatment of high blood pressure can reduce the risk of stroke, heart attack, and heart failure.  The goal of blood pressure treatment for most patients with hypertension is to keep the blood pressure under 140/90.  RN Overrode Recommendation:  Document Patient  Note to office.

## 2014-03-27 NOTE — Telephone Encounter (Signed)
Amy with [facility] wanted to make sure Dr Diona Browner sees this note prior to Dr Diona Browner leaving office and pt can get cb today. Butch Penny CMA will bring to Dr Rometta Emery attention.

## 2014-03-31 NOTE — Telephone Encounter (Signed)
Left detailed message, on answering machine at home number,  as instructed by Dr. Diona Browner.

## 2014-03-31 NOTE — Telephone Encounter (Signed)
Pt left v/m; pt called doctor who prescribed prednisone and pt was advised to stop prednisone; pt continues with nausea & diarrhea. Pt request cb if there is a med OTC that pt could take for nausea and diarrhea; pt wants to know if prednisone could cause pt nausea and diarrhea even thou no longer taking med.

## 2014-03-31 NOTE — Telephone Encounter (Signed)
If no recent antibiotics, she can use immodium for diarrhea.  Nothing OTC for nausea. SE from prednisone can last a while ie. 1 week. Give it more time, push fluids... Make foilow up appt if not improving as expected.

## 2014-04-09 ENCOUNTER — Ambulatory Visit (INDEPENDENT_AMBULATORY_CARE_PROVIDER_SITE_OTHER): Payer: Medicare Other | Admitting: Family Medicine

## 2014-04-09 ENCOUNTER — Encounter: Payer: Self-pay | Admitting: Family Medicine

## 2014-04-09 VITALS — BP 130/80 | HR 74 | Temp 98.3°F | Ht 62.5 in | Wt 175.8 lb

## 2014-04-09 DIAGNOSIS — L089 Local infection of the skin and subcutaneous tissue, unspecified: Secondary | ICD-10-CM

## 2014-04-09 DIAGNOSIS — L919 Hypertrophic disorder of the skin, unspecified: Secondary | ICD-10-CM

## 2014-04-09 DIAGNOSIS — L909 Atrophic disorder of skin, unspecified: Secondary | ICD-10-CM

## 2014-04-09 DIAGNOSIS — L918 Other hypertrophic disorders of the skin: Secondary | ICD-10-CM

## 2014-04-09 NOTE — Patient Instructions (Addendum)
Stop at front desk to schedule appt for 15 min visit on Tuesday for skin tag removal.

## 2014-04-09 NOTE — Progress Notes (Signed)
Pre visit review using our clinic review tool, if applicable. No additional management support is needed unless otherwise documented below in the visit note. 

## 2014-04-09 NOTE — Progress Notes (Signed)
   Subjective:    Patient ID: Kristy [name], female    DOB: 03-21-1941, 73 y.o.   MRN: [id number]  HPI  73 year old female presents for evaluation and possible removal of several skin lesions.  She has had skin tags on chest and neck for the past 10 years. Recently they have been getting caught in necklace .Marland Kitchen Becoming itrritated and several have torn.  No bleeding.   She wishes to have these removed with cryotherapy.   She has a wedding to go to in 2 days.. So will reschedule  appt to have removal done.       Review of Systems     Objective:   Physical Exam        Assessment & Plan:

## 2014-04-14 ENCOUNTER — Encounter: Payer: Self-pay | Admitting: Family Medicine

## 2014-04-14 ENCOUNTER — Ambulatory Visit (INDEPENDENT_AMBULATORY_CARE_PROVIDER_SITE_OTHER): Payer: Medicare Other | Admitting: Family Medicine

## 2014-04-14 VITALS — BP 130/82 | HR 69 | Temp 98.3°F | Ht 62.5 in | Wt 176.8 lb

## 2014-04-14 DIAGNOSIS — L919 Hypertrophic disorder of the skin, unspecified: Secondary | ICD-10-CM

## 2014-04-14 DIAGNOSIS — L089 Local infection of the skin and subcutaneous tissue, unspecified: Secondary | ICD-10-CM

## 2014-04-14 DIAGNOSIS — R0609 Other forms of dyspnea: Secondary | ICD-10-CM

## 2014-04-14 DIAGNOSIS — R0989 Other specified symptoms and signs involving the circulatory and respiratory systems: Secondary | ICD-10-CM

## 2014-04-14 DIAGNOSIS — R0600 Dyspnea, unspecified: Secondary | ICD-10-CM

## 2014-04-14 DIAGNOSIS — L909 Atrophic disorder of skin, unspecified: Secondary | ICD-10-CM

## 2014-04-14 DIAGNOSIS — L918 Other hypertrophic disorders of the skin: Secondary | ICD-10-CM

## 2014-04-14 NOTE — Progress Notes (Signed)
Pre visit review using our clinic review tool, if applicable. No additional management support is needed unless otherwise documented below in the visit note. 

## 2014-04-14 NOTE — Patient Instructions (Addendum)
Stop at front desk to set up treadmill stress test.  Keep area clean and dry, call if any redness spreading  or fever.

## 2014-04-14 NOTE — Assessment & Plan Note (Addendum)
EKG slightly abnormal but non diagnostic eval with strss test and consider ECHO.

## 2014-04-14 NOTE — Assessment & Plan Note (Signed)
Removed with scissors.

## 2014-04-14 NOTE — Progress Notes (Signed)
   Subjective:    Patient ID: Kristy [name], female    DOB: [birth year], 73 y.o.   MRN: [id number]  73 year old female presents for skin tag removal.  She has also noted the following issues:  Shortness of Breath This is a new problem. The current episode started 1 to 4 weeks ago. The problem occurs intermittently. The problem has been unchanged. Pertinent negatives include no abdominal pain, chest pain, claudication, fever, hemoptysis, leg pain, leg swelling, orthopnea, PND, sore throat, sputum production, swollen glands, syncope or wheezing. The symptoms are aggravated by exercise. Associated symptoms comments: slight chest discomfort, no pain.  Slight funny feeling in left arm.. The patient has no known risk factors for DVT/PE. She has tried nothing for the symptoms. There is no history of asthma, bronchiolitis, CAD, chronic lung disease, COPD, DVT, a heart failure, PE, pneumonia or a recent surgery.    7 Review of Systems  Constitutional: Negative for fever.  HENT: Negative for sore throat.   Respiratory: Positive for shortness of breath. Negative for hemoptysis, sputum production and wheezing.   Cardiovascular: Negative for chest pain, orthopnea, claudication, leg swelling, syncope and PND.  Gastrointestinal: Negative for abdominal pain.       Objective:   Physical Exam  Constitutional: Vital signs are normal. She appears well-developed and well-nourished. She is cooperative.  Non-toxic appearance. She does not appear ill. No distress.  HENT:  Head: Normocephalic.  Right Ear: Hearing, tympanic membrane, external ear and ear canal normal. Tympanic membrane is not erythematous, not retracted and not bulging.  Left Ear: Hearing, tympanic membrane, external ear and ear canal normal. Tympanic membrane is not erythematous, not retracted and not bulging.  Nose: No mucosal edema or rhinorrhea. Right sinus exhibits no maxillary sinus tenderness and no frontal sinus tenderness. Left sinus  exhibits no maxillary sinus tenderness and no frontal sinus tenderness.  Mouth/Throat: Uvula is midline, oropharynx is clear and moist and mucous membranes are normal.  Eyes: Conjunctivae, EOM and lids are normal. Pupils are equal, round, and reactive to light. Lids are everted and swept, no foreign bodies found.  Neck: Trachea normal and normal range of motion. Neck supple. Carotid bruit is not present. No mass and no thyromegaly present.  Cardiovascular: Normal rate, regular rhythm, S1 normal, S2 normal, normal heart sounds, intact distal pulses and normal pulses.  Exam reveals no gallop and no friction rub.   No murmur heard. Pulmonary/Chest: Effort normal and breath sounds normal. Not tachypneic. No respiratory distress. She has no decreased breath sounds. She has no wheezes. She has no rhonchi. She has no rales.  Abdominal: Soft. Normal appearance and bowel sounds are normal. There is no tenderness.  Neurological: She is alert.  Skin: Skin is warm, dry and intact. No rash noted.  Many skin tags on neck, right armpit, sevreal irritated and inflamed from rubbing with necklace.  Psychiatric: Her speech is normal and behavior is normal. Judgment and thought content normal. Her mood appears not anxious. Cognition and memory are normal. She does not exhibit a depressed mood.          Assessment & Plan:  Procedure note: Several irritated infammed skin tags on neck and between breasts. Several more non inflamed lesions as well. Cold spray used to anesthetize the areas ( 1o skin tags in total), scissors used to remove lesions, with minimal bleeding. Bandaids applied.   Return if lesions fail to fully resolve.

## 2014-04-17 DIAGNOSIS — M25552 Pain in left hip: Secondary | ICD-10-CM | POA: Insufficient documentation

## 2014-04-17 DIAGNOSIS — M5416 Radiculopathy, lumbar region: Secondary | ICD-10-CM | POA: Insufficient documentation

## 2014-04-17 DIAGNOSIS — M47816 Spondylosis without myelopathy or radiculopathy, lumbar region: Secondary | ICD-10-CM | POA: Insufficient documentation

## 2014-05-07 ENCOUNTER — Encounter: Payer: Self-pay | Admitting: Cardiovascular Disease

## 2014-05-07 ENCOUNTER — Ambulatory Visit (INDEPENDENT_AMBULATORY_CARE_PROVIDER_SITE_OTHER): Payer: Medicare Other | Admitting: Cardiovascular Disease

## 2014-05-07 VITALS — BP 143/86 | HR 69 | Ht 62.0 in | Wt 174.2 lb

## 2014-05-07 DIAGNOSIS — R0989 Other specified symptoms and signs involving the circulatory and respiratory systems: Secondary | ICD-10-CM

## 2014-05-07 DIAGNOSIS — R0600 Dyspnea, unspecified: Secondary | ICD-10-CM

## 2014-05-07 DIAGNOSIS — R0609 Other forms of dyspnea: Secondary | ICD-10-CM

## 2014-05-07 NOTE — Patient Instructions (Signed)
We will discuss w/ Dr. Diona Browner setting up further testing. Please call the office if you have any further questions.   Call or return to clinic prn if these symptoms worsen or fail to improve as anticipated.

## 2014-05-07 NOTE — Procedures (Signed)
Exercise Treadmill Test Treadmill ordered for recent epsiodes of chest pain.  Resting EKG shows NSR with rate of 68 bpm, no significant ST or T wave changes Resting blood pressure of 143/86. Stand bruce protocal was used.  Patient exercised for 15 min 16 sec,  Peak heart rate of 126 bpm.  This was 85% of the maximum predicted heart rate (target heart rate 150). Achieved 9.4 METS No symptoms of chest pain or lightheadedness were reported at peak stress or in recovery.  She did have shortness of breath at peak exertion Peak Blood pressure recorded was 201/88 There was greater than 1 mm ST depression noted starting in stage I and continuing throughout her exercise concerning for ischemia  FINAL IMPRESSION: Adequate exercise tolerance, target heart rate achieved. She did have greater than 1 mm ST depressions starting early in her test extending throughout the exercise period, concerning for ischemia These ST depressions resolved in recovery Results were discussed with the patient. Nuclear Myoview was recommended given the ST abnormality. This will be discussed with primary care

## 2014-05-11 ENCOUNTER — Telehealth: Payer: Self-pay | Admitting: Cardiovascular Disease

## 2014-05-11 DIAGNOSIS — R9439 Abnormal result of other cardiovascular function study: Secondary | ICD-10-CM

## 2014-05-11 NOTE — Telephone Encounter (Signed)
Kristy [name] had her treadmill late last week This showed ST changes concerning for ischemia Would recommend further testing with treadmill Myoview  This was discussed with the patient. She be willing to complete the study  Please let me know how you would like to proceed thx [employer]

## 2014-05-12 NOTE — Telephone Encounter (Signed)
Will you take it from here? Set her up for the next study and follow up with her?  Spoke with pt as well, she is okay with following up with you as well. Let me know if there is a different way you would like to proceed.  Thanks again!

## 2014-05-12 NOTE — Telephone Encounter (Signed)
Spoke w/ pt.   She is sched for ETT Myoview at [facility] 05/14/14 @ 8:00. Pt is aware of instructions and to [facility] mall at 7:45am.

## 2014-05-12 NOTE — Addendum Note (Signed)
Addended by: Dede Query R on: 05/12/2014 12:26 PM   Modules accepted: Orders

## 2014-05-14 ENCOUNTER — Ambulatory Visit: Payer: Self-pay | Admitting: Cardiovascular Disease

## 2014-05-14 DIAGNOSIS — R079 Chest pain, unspecified: Secondary | ICD-10-CM

## 2014-05-15 ENCOUNTER — Other Ambulatory Visit: Payer: Self-pay

## 2014-05-15 DIAGNOSIS — R9439 Abnormal result of other cardiovascular function study: Secondary | ICD-10-CM

## 2014-05-19 ENCOUNTER — Other Ambulatory Visit: Payer: Self-pay | Admitting: Family Medicine

## 2014-06-02 DIAGNOSIS — M51369 Other intervertebral disc degeneration, lumbar region without mention of lumbar back pain or lower extremity pain: Secondary | ICD-10-CM | POA: Insufficient documentation

## 2014-06-02 DIAGNOSIS — M5136 Other intervertebral disc degeneration, lumbar region: Secondary | ICD-10-CM | POA: Insufficient documentation

## 2014-08-18 ENCOUNTER — Ambulatory Visit (INDEPENDENT_AMBULATORY_CARE_PROVIDER_SITE_OTHER): Payer: Medicare Other | Admitting: Family Medicine

## 2014-08-18 ENCOUNTER — Encounter: Payer: Self-pay | Admitting: Family Medicine

## 2014-08-18 VITALS — BP 167/77 | HR 72 | Temp 98.1°F | Ht 62.5 in | Wt 181.5 lb

## 2014-08-18 DIAGNOSIS — R232 Flushing: Secondary | ICD-10-CM | POA: Insufficient documentation

## 2014-08-18 DIAGNOSIS — N3000 Acute cystitis without hematuria: Secondary | ICD-10-CM

## 2014-08-18 DIAGNOSIS — N390 Urinary tract infection, site not specified: Secondary | ICD-10-CM | POA: Insufficient documentation

## 2014-08-18 DIAGNOSIS — R8290 Unspecified abnormal findings in urine: Secondary | ICD-10-CM

## 2014-08-18 DIAGNOSIS — R3129 Other microscopic hematuria: Secondary | ICD-10-CM

## 2014-08-18 DIAGNOSIS — N3001 Acute cystitis with hematuria: Secondary | ICD-10-CM

## 2014-08-18 DIAGNOSIS — R82998 Other abnormal findings in urine: Secondary | ICD-10-CM

## 2014-08-18 DIAGNOSIS — Z23 Encounter for immunization: Secondary | ICD-10-CM

## 2014-08-18 LAB — POCT URINALYSIS DIPSTICK
Bilirubin, UA: NEGATIVE
Glucose, UA: NEGATIVE
Ketones, UA: NEGATIVE
Nitrite, UA: NEGATIVE
Spec Grav, UA: >=1.03
Urobilinogen, UA: 0.2
pH, UA: 6

## 2014-08-18 LAB — POCT UA - MICROSCOPIC ONLY

## 2014-08-18 MED ORDER — CIPROFLOXACIN HCL 250 MG PO TABS: 250.0000 mg | ORAL_TABLET | Freq: Two times a day (BID) | ORAL | Status: DC

## 2014-08-18 NOTE — Progress Notes (Signed)
   Subjective:    Patient ID: Kristy [name], female    DOB: 10/21/1941, 73 y.o.   MRN: [id number]  HPI  73 year old female presents with new onsebnormal urine odor and hot flashes.  She reports that she has had change in urine odor x several weeks. Occurs intermittently.  No dysuria, no change in urinary frequency. No blood inurine One episode lasting a few minutes of change in sensation in right CVA area (felt like momentary pulsating).  She has been having hot flashes x several weeks. No fever. Has been sweating a lot at night. Wt Readings from Last 3 Encounters:  08/18/14 181 lb 8 oz (82.328 kg)  05/07/14 174 lb 4 oz (79.039 kg)  04/14/14 176 lb 12 oz (80.173 kg)   She is post menopausal.  Has upcoming renal US with vascular MD  She has had full work up of hematuria since age 58.  Review of Systems  Constitutional: Negative for fever and fatigue.  HENT: Negative for ear pain.   Eyes: Negative for pain.  Respiratory: Negative for chest tightness and shortness of breath.   Cardiovascular: Negative for chest pain, palpitations and leg swelling.  Gastrointestinal: Negative for abdominal pain.  Genitourinary: Negative for dysuria.       Objective:   Physical Exam  Constitutional: Vital signs are normal. She appears well-developed and well-nourished. She is cooperative.  Non-toxic appearance. She does not appear ill. No distress.  HENT:  Head: Normocephalic.  Right Ear: Hearing, tympanic membrane, external ear and ear canal normal. Tympanic membrane is not erythematous, not retracted and not bulging.  Left Ear: Hearing, tympanic membrane, external ear and ear canal normal. Tympanic membrane is not erythematous, not retracted and not bulging.  Nose: No mucosal edema or rhinorrhea. Right sinus exhibits no maxillary sinus tenderness and no frontal sinus tenderness. Left sinus exhibits no maxillary sinus tenderness and no frontal sinus tenderness.  Mouth/Throat: Uvula is midline,  oropharynx is clear and moist and mucous membranes are normal.  Eyes: Conjunctivae, EOM and lids are normal. Pupils are equal, round, and reactive to light. Lids are everted and swept, no foreign bodies found.  Neck: Trachea normal and normal range of motion. Neck supple. Carotid bruit is not present. No mass and no thyromegaly present.  Cardiovascular: Normal rate, regular rhythm, S1 normal, S2 normal, normal heart sounds, intact distal pulses and normal pulses.  Exam reveals no gallop and no friction rub.   No murmur heard. Pulmonary/Chest: Effort normal and breath sounds normal. Not tachypneic. No respiratory distress. She has no decreased breath sounds. She has no wheezes. She has no rhonchi. She has no rales.  Abdominal: Soft. Normal appearance and bowel sounds are normal. There is no tenderness.  Neurological: She is alert.  Skin: Skin is warm, dry and intact. No rash noted.  Psychiatric: Her speech is normal and behavior is normal. Judgment and thought content normal. Her mood appears not anxious. Cognition and memory are normal. She does not exhibit a depressed mood.          Assessment & Plan:

## 2014-08-18 NOTE — Addendum Note (Signed)
Addended by: Carter Kitten on: 08/18/2014 10:50 AM   Modules accepted: Orders

## 2014-08-18 NOTE — Assessment & Plan Note (Signed)
UA positive.  Micro inconclusive in setting of chronic hematuria  Will try to have her recollect sample for culture.  Start cipro ... Cover for complicated UTI given age, CVA pain and possible temperature fluctuations.

## 2014-08-18 NOTE — Patient Instructions (Signed)
Pt given written instructions.

## 2014-08-18 NOTE — Progress Notes (Signed)
Pre visit review using our clinic review tool, if applicable. No additional management support is needed unless otherwise documented below in the visit note. 

## 2014-08-21 LAB — URINE CULTURE: Colony Count: >=100000

## 2014-09-01 ENCOUNTER — Other Ambulatory Visit: Payer: Self-pay | Admitting: *Deleted

## 2014-09-01 MED ORDER — METOPROLOL SUCCINATE ER 100 MG PO TB24: 100.0000 mg | ORAL_TABLET | Freq: Every day | ORAL | Status: DC

## 2014-10-23 ENCOUNTER — Other Ambulatory Visit: Payer: Self-pay | Admitting: Family Medicine

## 2014-10-23 ENCOUNTER — Telehealth: Payer: Self-pay

## 2014-10-23 NOTE — Telephone Encounter (Signed)
PLEASE NOTE: All timestamps contained within this report are represented as [place] Standard Time. CONFIDENTIALTY NOTICE: This fax transmission is intended only for the addressee. It contains information that is legally privileged, confidential or otherwise protected from use or disclosure. If you are not the intended recipient, you are strictly prohibited from reviewing, disclosing, copying using or disseminating any of this information or taking any action in reliance on or regarding this information. If you have received this fax in error, please notify us immediately by telephone so that we can arrange for its return to [place]. Phone: [phone], Toll-Free: [phone], Fax: [phone] Page: 1 of 2 Call Id: [id number] [place] Patient Name: Kristy [name] Gender: Female DOB: [birth year] Age: 73 Y 80 M 23 D Return Phone Number: [phone] (Primary), [phone] (Secondary) Address: [phone] [place] City/State/Zip: [place] [address] Client [facility] - Client Client Site [facility] - Day Physician Diona Browner, Amy Contact Type Call Call Type Triage / Clinical Relationship To Patient Self Return Phone Number [phone] (Primary) Chief Complaint Blood Pressure High Initial Comment Caller states c/o hair thinning, thinks it is related to blood pressure medication PreDisposition Call Doctor Nurse Assessment Nurse: Wynetta Emery, RN, Baker Janus Date/Time Eilene Ghazi Time): 10/23/2014 9:04:02 AM Confirm and document reason for call. If symptomatic, describe symptoms. ---Reino Bellis has hair that is thinning and is concerned if the blood pressure medication is causing her to have thin hair. Has the patient traveled out of the country within the last 30 days? ---No Does the patient require triage? ---Yes Related visit to physician within the last 2 weeks?  ---No Does the PT have any chronic conditions? (i.e. diabetes, asthma, etc.) ---Yes List chronic conditions. ---HTN Fiber muscalar dysplagia Guidelines Guideline Title Affirmed Question Affirmed Notes Nurse Date/Time Eilene Ghazi Time) No Guideline or Reference Available Nursing judgment Ivin Booty 10/23/2014 9:06:48 AM Disp. Time Eilene Ghazi Time) Disposition Final User 10/23/2014 8:52:15 AM Send To Clinical Follow Up Rich Brave, Amy 10/23/2014 9:08:16 AM See Physician within 24 Hours Yes Wynetta Emery, RN, Christin Bach Understands: Yes Disagree/Comply: Comply Care Advice Given Per Guideline PLEASE NOTE: All timestamps contained within this report are represented as [place] Standard Time. CONFIDENTIALTY NOTICE: This fax transmission is intended only for the addressee. It contains information that is legally privileged, confidential or otherwise protected from use or disclosure. If you are not the intended recipient, you are strictly prohibited from reviewing, disclosing, copying using or disseminating any of this information or taking any action in reliance on or regarding this information. If you have received this fax in error, please notify us immediately by telephone so that we can arrange for its return to [place]. Phone: [phone], Toll-Free: [phone], Fax: [phone] Page: 2 of 2 Call Id: [id number] Care Advice Given Per Guideline SEE PHYSICIAN WITHIN 24 HOURS: * You become worse * New symptoms develop. CARE ADVICE per nurse's experience and judgement. Comments User: Michele Rockers, RN Date/Time Eilene Ghazi Time): 10/23/2014 9:14:21 AM Warm transfer to office Vaughan Basta advised computer system down if she needs to see MD UC or ED if not she can wait until Monday to see MD in Office. will see MD in office Monday if she can obtain appt after calling in on Monday. Referrals REFERRED TO PCP OFFICE

## 2014-10-23 NOTE — Telephone Encounter (Signed)
Agree okay to wait to appt 12/15.

## 2014-10-23 NOTE — Telephone Encounter (Signed)
Noted. ok to wait until PCP's return. She has appt 11/03/2014.

## 2014-11-03 ENCOUNTER — Encounter: Payer: Self-pay | Admitting: Family Medicine

## 2014-11-03 ENCOUNTER — Ambulatory Visit: Payer: Medicare Other | Admitting: Family Medicine

## 2014-11-03 ENCOUNTER — Ambulatory Visit (INDEPENDENT_AMBULATORY_CARE_PROVIDER_SITE_OTHER): Payer: Medicare Other | Admitting: Family Medicine

## 2014-11-03 VITALS — BP 140/83 | HR 63 | Temp 98.0°F | Ht 62.5 in | Wt 179.2 lb

## 2014-11-03 DIAGNOSIS — L649 Androgenic alopecia, unspecified: Secondary | ICD-10-CM

## 2014-11-03 DIAGNOSIS — R140 Abdominal distension (gaseous): Secondary | ICD-10-CM

## 2014-11-03 DIAGNOSIS — K219 Gastro-esophageal reflux disease without esophagitis: Secondary | ICD-10-CM

## 2014-11-03 NOTE — Progress Notes (Signed)
Pre visit review using our clinic review tool, if applicable. No additional management support is needed unless otherwise documented below in the visit note. 

## 2014-11-03 NOTE — Progress Notes (Signed)
   Subjective:    Patient ID: Kristy [name], female    DOB: Oct 17, 1941, 73 y.o.   MRN: [id number]  HPI  73 year old female presents for HTN and GERD concerns. She is concerned that her hair loss, thinning hair could be due to BP med. She has noted in last year. She has been on metoprolol for many years.  nml thyroid. No using lost of staighteners.  Hypertension:   Moderate control on metoprolol and chlorthalidone  BP Readings from Last 3 Encounters:  11/03/14 140/83  08/18/14 167/77  05/07/14 143/86  Using medication without problems or lightheadedness: See above Chest pain with exertion:None  Edema:None Short of breath:none Average home BPs: 116-140/70-80 Other issues:  GERD: Has heartburn, bloating and gas after eating occ. Intermittent.  Occ episdoe of N/V. Occ has increase in BMs up to 5 times a day, but then occ constipation. She has been trying to avoid foods that cause it to be wrose. Start nexium in last few day, improvement in GERD, but still blaoting and gas.    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       Review of Systems  Constitutional: Negative for fever and fatigue.  HENT: Negative for ear pain.   Eyes: Negative for pain.  Respiratory: Negative for chest tightness and shortness of breath.   Cardiovascular: Negative for chest pain, palpitations and leg swelling.  Gastrointestinal: Negative for abdominal pain.  Genitourinary: Negative for dysuria.       Objective:   Physical Exam  Constitutional: Vital signs are normal. She appears well-developed and well-nourished. She is cooperative.  Non-toxic appearance. She does not appear ill. No distress.  HENT:  Head: Normocephalic.  Right Ear: Hearing, tympanic membrane, external ear and ear canal normal. Tympanic membrane is not erythematous, not retracted and not bulging.  Left Ear: Hearing, tympanic membrane, external ear and ear canal normal. Tympanic membrane is not erythematous,  not retracted and not bulging.  Nose: No mucosal edema or rhinorrhea. Right sinus exhibits no maxillary sinus tenderness and no frontal sinus tenderness. Left sinus exhibits no maxillary sinus tenderness and no frontal sinus tenderness.  Mouth/Throat: Uvula is midline, oropharynx is clear and moist and mucous membranes are normal.  Eyes: Conjunctivae, EOM and lids are normal. Pupils are equal, round, and reactive to light. Lids are everted and swept, no foreign bodies found.  Neck: Trachea normal and normal range of motion. Neck supple. Carotid bruit is not present. No thyroid mass and no thyromegaly present.  Cardiovascular: Normal rate, regular rhythm, S1 normal, S2 normal, normal heart sounds, intact distal pulses and normal pulses.  Exam reveals no gallop and no friction rub.   No murmur heard. Pulmonary/Chest: Effort normal and breath sounds normal. No tachypnea. No respiratory distress. She has no decreased breath sounds. She has no wheezes. She has no rhonchi. She has no rales.  Abdominal: Soft. Normal appearance and bowel sounds are normal. There is no tenderness.  Neurological: She is alert.  Skin: Skin is warm, dry and intact. No rash noted.  Psychiatric: Her speech is normal and behavior is normal. Judgment and thought content normal. Her mood appears not anxious. Cognition and memory are normal. She does not exhibit a depressed mood.          Assessment & Plan:

## 2014-11-03 NOTE — Patient Instructions (Addendum)
Trial of topical minoxidil for women 2-5 % twice daily Continue current BP meds.  keep a food diary to help determine what triggers issue.  Push fluids, increase fiber in diet.  Avoid greasy fatty foods, trial of lactose free foods. Also avoid reflux triggers such as caffeine, alcohol, spicy, tomato, citris, chocolate.  Start probiotic such as align ( lactobacilli).  Continue nexium 20 mg daily, if reflux not improving in 1-2 weeks increase nexium 2 tabs of 20 mg daily. If helping continue nexium for 4 weeks then wean down. Schedule follow up multiple issues in 1-3 months.

## 2014-12-01 DIAGNOSIS — L649 Androgenic alopecia, unspecified: Secondary | ICD-10-CM | POA: Insufficient documentation

## 2014-12-01 DIAGNOSIS — R140 Abdominal distension (gaseous): Secondary | ICD-10-CM | POA: Insufficient documentation

## 2014-12-01 NOTE — Assessment & Plan Note (Signed)
Trial of nexium.

## 2014-12-01 NOTE — Assessment & Plan Note (Signed)
keep a food diary to help determine what triggers issue.  Push fluids, increase fiber in diet.  Avoid greasy fatty foods, trial of lactose free foods. Also avoid reflux triggers such as caffeine, alcohol, spicy, tomato, citris, chocolate.  Start probiotic such as align ( lactobacilli).

## 2014-12-01 NOTE — Assessment & Plan Note (Signed)
Trial of topical minoxidil for women 2-5 % twice daily

## 2014-12-07 DIAGNOSIS — H35033 Hypertensive retinopathy, bilateral: Secondary | ICD-10-CM | POA: Diagnosis not present

## 2014-12-07 DIAGNOSIS — H2513 Age-related nuclear cataract, bilateral: Secondary | ICD-10-CM | POA: Diagnosis not present

## 2014-12-07 DIAGNOSIS — H04123 Dry eye syndrome of bilateral lacrimal glands: Secondary | ICD-10-CM | POA: Diagnosis not present

## 2014-12-07 DIAGNOSIS — H524 Presbyopia: Secondary | ICD-10-CM | POA: Diagnosis not present

## 2014-12-07 LAB — HM DIABETES FOOT EXAM

## 2014-12-08 ENCOUNTER — Ambulatory Visit (INDEPENDENT_AMBULATORY_CARE_PROVIDER_SITE_OTHER): Payer: Medicare Other | Admitting: Family Medicine

## 2014-12-08 ENCOUNTER — Encounter: Payer: Self-pay | Admitting: Family Medicine

## 2014-12-08 VITALS — BP 130/84 | HR 65 | Temp 97.4°F | Ht 62.5 in | Wt 182.5 lb

## 2014-12-08 DIAGNOSIS — I10 Essential (primary) hypertension: Secondary | ICD-10-CM

## 2014-12-08 DIAGNOSIS — I998 Other disorder of circulatory system: Secondary | ICD-10-CM

## 2014-12-08 DIAGNOSIS — R61 Generalized hyperhidrosis: Secondary | ICD-10-CM | POA: Insufficient documentation

## 2014-12-08 DIAGNOSIS — N951 Menopausal and female climacteric states: Secondary | ICD-10-CM

## 2014-12-08 DIAGNOSIS — R232 Flushing: Secondary | ICD-10-CM | POA: Insufficient documentation

## 2014-12-08 DIAGNOSIS — R7309 Other abnormal glucose: Secondary | ICD-10-CM

## 2014-12-08 DIAGNOSIS — E039 Hypothyroidism, unspecified: Secondary | ICD-10-CM | POA: Diagnosis not present

## 2014-12-08 DIAGNOSIS — E785 Hyperlipidemia, unspecified: Secondary | ICD-10-CM

## 2014-12-08 DIAGNOSIS — R7303 Prediabetes: Secondary | ICD-10-CM

## 2014-12-08 NOTE — Assessment & Plan Note (Signed)
Due for re-eval. 

## 2014-12-08 NOTE — Assessment & Plan Note (Signed)
Along with flushing.. Check urine catecholamines for adrenal issue.

## 2014-12-08 NOTE — Progress Notes (Signed)
Pre visit review using our clinic review tool, if applicable. No additional management support is needed unless otherwise documented below in the visit note. 

## 2014-12-08 NOTE — Patient Instructions (Addendum)
Increase fiber and ewater, avoid greasy foods.  Working on stress reduction.  Stop at lab on way out. We will call with results.

## 2014-12-08 NOTE — Progress Notes (Signed)
Subjective:    Patient ID: Kristy [name], female    DOB: [birth year], 73 y.o.   MRN: [id number]  HPI  74 year old female with history of fibromuscular dysplasia, HTN anxiety presents with continued hot flashes ongoing x 4 months. She has been sweating a lot at night . Wt Readings from Last 3 Encounters:  12/08/14 182 lb 8 oz (82.781 kg)  11/03/14 179 lb 4 oz (81.307 kg)  08/18/14 181 lb 8 oz (82.328 kg)   She was treated in 08/18/2014 for UTI, thought to be causing hot  flashes at that time.  No improvement with hot flashes.  Associated with feeling like heart stops for a second, then restarts, no palpitations. No chest pain. NO SOB. faciall flushing also.  Last 30 min. Improved with resolution. She is somewhat anxious about it.    She has experienced experienced  hot followed by cold. She has sometimes noted elevated BP spikes at same time.   She has had some nausea and frequent BMs after greasy foods.  She still has her ovaries. No family history of ovarian cancer.     Review of Systems  Constitutional: Negative for fever, appetite change, fatigue and unexpected weight change.  HENT: Negative for congestion, ear pain, sinus pressure, sneezing, sore throat and trouble swallowing.   Eyes: Negative for pain and itching.  Respiratory: Negative for cough, shortness of breath and wheezing.   Cardiovascular: Negative for chest pain, palpitations and leg swelling.  Gastrointestinal: Positive for nausea, diarrhea and constipation. Negative for abdominal pain and blood in stool.  Genitourinary: Negative for dysuria, hematuria, vaginal discharge, difficulty urinating, menstrual problem and pelvic pain.  Skin: Negative for rash.  Neurological: Negative for syncope, weakness, light-headedness, numbness and headaches.  Psychiatric/Behavioral: Negative for confusion and dysphoric mood. The patient is not nervous/anxious.        Objective:   Physical Exam  Constitutional: Vital  signs are normal. She appears well-developed and well-nourished. She is cooperative.  Non-toxic appearance. She does not appear ill. No distress.  HENT:  Head: Normocephalic.  Right Ear: Hearing, tympanic membrane, external ear and ear canal normal. Tympanic membrane is not erythematous, not retracted and not bulging.  Left Ear: Hearing, tympanic membrane, external ear and ear canal normal. Tympanic membrane is not erythematous, not retracted and not bulging.  Nose: No mucosal edema or rhinorrhea. Right sinus exhibits no maxillary sinus tenderness and no frontal sinus tenderness. Left sinus exhibits no maxillary sinus tenderness and no frontal sinus tenderness.  Mouth/Throat: Uvula is midline, oropharynx is clear and moist and mucous membranes are normal.  Eyes: Conjunctivae, EOM and lids are normal. Pupils are equal, round, and reactive to light. Lids are everted and swept, no foreign bodies found.  Neck: Trachea normal and normal range of motion. Neck supple. Carotid bruit is not present. No thyroid mass and no thyromegaly present.  Cardiovascular: Normal rate, regular rhythm, S1 normal, S2 normal, normal heart sounds, intact distal pulses and normal pulses.  Exam reveals no gallop and no friction rub.   No murmur heard. Pulmonary/Chest: Effort normal and breath sounds normal. No tachypnea. No respiratory distress. She has no decreased breath sounds. She has no wheezes. She has no rhonchi. She has no rales.  Abdominal: Soft. Normal appearance and bowel sounds are normal. There is no tenderness.  Neurological: She is alert.  Skin: Skin is warm, dry and intact. No rash noted.  Psychiatric: Her speech is normal and behavior is normal. Judgment and thought content  normal. Her mood appears not anxious. Cognition and memory are normal. She does not exhibit a depressed mood.          Assessment & Plan:  Unclear cause of got flashes, flushing, night sweats. No pother red flags such as unexpected  wait loss, early satiety.  Eval with labs.  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

## 2014-12-09 LAB — COMPREHENSIVE METABOLIC PANEL
ALT: 19 U/L (ref 0–35)
AST: 19 U/L (ref 0–37)
Albumin: 4.2 g/dL (ref 3.5–5.2)
Alkaline Phosphatase: 73 U/L (ref 39–117)
BUN: 14 mg/dL (ref 6–23)
CO2: 29 mEq/L (ref 19–32)
Calcium: 9.9 mg/dL (ref 8.4–10.5)
Chloride: 105 mEq/L (ref 96–112)
Creatinine, Ser: 0.66 mg/dL (ref 0.40–1.20)
GFR: 113.37 mL/min (ref >60.00)
Glucose, Bld: 87 mg/dL (ref 70–99)
Potassium: 4 mEq/L (ref 3.5–5.1)
Sodium: 138 mEq/L (ref 135–145)
Total Bilirubin: 0.4 mg/dL (ref 0.2–1.2)
Total Protein: 7.3 g/dL (ref 6.0–8.3)

## 2014-12-09 LAB — CBC WITH DIFFERENTIAL/PLATELET
Basophils Absolute: 0 10*3/uL (ref 0.0–0.1)
Basophils Relative: 0.7 % (ref 0.0–3.0)
Eosinophils Absolute: 0.2 10*3/uL (ref 0.0–0.7)
Eosinophils Relative: 2.5 % (ref 0.0–5.0)
HCT: 41.7 % (ref 36.0–46.0)
Hemoglobin: 14.3 g/dL (ref 12.0–15.0)
Lymphocytes Relative: 48.5 % — ABNORMAL HIGH (ref 12.0–46.0)
Lymphs Abs: 3.4 10*3/uL (ref 0.7–4.0)
MCHC: 34.2 g/dL (ref 30.0–36.0)
MCV: 94.9 fl (ref 78.0–100.0)
Monocytes Absolute: 0.5 10*3/uL (ref 0.1–1.0)
Monocytes Relative: 6.7 % (ref 3.0–12.0)
Neutro Abs: 2.9 10*3/uL (ref 1.4–7.7)
Neutrophils Relative %: 41.6 % — ABNORMAL LOW (ref 43.0–77.0)
Platelets: 301 10*3/uL (ref 150.0–400.0)
RBC: 4.4 Mil/uL (ref 3.87–5.11)
RDW: 13 % (ref 11.5–15.5)
WBC: 7 10*3/uL (ref 4.0–10.5)

## 2014-12-09 LAB — LIPID PANEL
Cholesterol: 227 mg/dL — ABNORMAL HIGH (ref 0–200)
HDL: 48.6 mg/dL (ref >39.00)
LDL Cholesterol: 140 mg/dL — ABNORMAL HIGH (ref 0–99)
NonHDL: 178.4
Total CHOL/HDL Ratio: 5
Triglycerides: 194 mg/dL — ABNORMAL HIGH (ref 0.0–149.0)
VLDL: 38.8 mg/dL (ref 0.0–40.0)

## 2014-12-09 LAB — TSH: TSH: 1.79 u[IU]/mL (ref 0.35–4.50)

## 2014-12-09 LAB — HEMOGLOBIN A1C: Hgb A1c MFr Bld: 6.2 % (ref 4.6–6.5)

## 2014-12-10 ENCOUNTER — Encounter: Payer: Self-pay | Admitting: *Deleted

## 2014-12-10 NOTE — Addendum Note (Signed)
Addended by: Ellamae Sia on: 12/10/2014 09:00 AM   Modules accepted: Orders

## 2014-12-16 DIAGNOSIS — E785 Hyperlipidemia, unspecified: Secondary | ICD-10-CM | POA: Diagnosis not present

## 2014-12-16 DIAGNOSIS — N951 Menopausal and female climacteric states: Secondary | ICD-10-CM | POA: Diagnosis not present

## 2014-12-16 DIAGNOSIS — R61 Generalized hyperhidrosis: Secondary | ICD-10-CM | POA: Diagnosis not present

## 2014-12-16 DIAGNOSIS — R7309 Other abnormal glucose: Secondary | ICD-10-CM | POA: Diagnosis not present

## 2014-12-16 DIAGNOSIS — I998 Other disorder of circulatory system: Secondary | ICD-10-CM | POA: Diagnosis not present

## 2014-12-16 NOTE — Addendum Note (Signed)
Addended by: Daralene Milch C on: 12/16/2014 11:08 AM   Modules accepted: Orders

## 2014-12-17 ENCOUNTER — Encounter: Payer: Self-pay | Admitting: Family Medicine

## 2014-12-20 LAB — CATECHOLAMINES, FRACTIONATED, URINE, 24 HOUR
Calculated Total (E+NE): 70 mcg/24 h (ref 26–121)
Creatinine, Urine mg/day-CATEUR: 1.77 g/(24.h) (ref 0.63–2.50)
Dopamine, 24 hr Urine: 273 mcg/24 h (ref 52–480)
Norepinephrine, 24 hr Ur: 70 mcg/24 h (ref 15–100)
Total Volume - CF 24Hr U: 1450 mL

## 2014-12-21 LAB — VMA, URINE, 24 HOUR
Creatinine 24h urine: 1.77 g/(24.h) (ref 0.63–2.50)
Vanillylmandelic Acid, (VMA): 6.2 mg/24 h — ABNORMAL HIGH (ref <=6.0)

## 2014-12-22 ENCOUNTER — Telehealth: Payer: Self-pay | Admitting: Family Medicine

## 2014-12-22 NOTE — Telephone Encounter (Signed)
PLEASE NOTE: All timestamps contained within this report are represented as [place] Standard Time. CONFIDENTIALTY NOTICE: This fax transmission is intended only for the addressee. It contains information that is legally privileged, confidential or otherwise protected from use or disclosure. If you are not the intended recipient, you are strictly prohibited from reviewing, disclosing, copying using or disseminating any of this information or taking any action in reliance on or regarding this information. If you have received this fax in error, please notify us immediately by telephone so that we can arrange for its return to [place]. Phone: [phone], Toll-Free: [phone], Fax: [phone] Page: 1 of 2 Call Id: [id number] [place] Patient Name: Kristy [name] Gender: Female DOB: September 16, 1941 Age: 74 Y 53 M 22 D Return Phone Number: [phone] (Primary), [phone] (Secondary) Address: [address] City/State/Zip: [place] [address] Client [place] [facility] - Client Client Site [place] - Day Physician Diona Browner, [place] Contact Type Call Call Type Triage / [place] Name Albana Saperstein Relationship To Patient Self Appointment Disposition EMR Appointment Not Necessary Return Phone Number [phone] (Primary) Chief Complaint Cold Symptom Initial Comment Caller states she is having cold symptoms. Yellow mucus. She has had the symptoms for seven days, and not getting better. PreDisposition Call Doctor Info pasted into Epic Yes Nurse Assessment Nurse: Mallie Mussel, RN, Alveta Heimlich Date/Time Eilene Ghazi Time): 12/22/2014 12:39:49 PM Confirm and document reason for call. If symptomatic, describe symptoms. ---Caller states that she has had a cough with runny nose for about 7 days now. She is coughing up yellow mucus. She denies difficulty breathing, but she  does have some rattling in her chest. She denies fever. Has the patient traveled out of the country within the last 30 days? ---No Does the patient require triage? ---Yes Related visit to physician within the last 2 weeks? ---No Does the PT have any chronic conditions? (i.e. diabetes, asthma, etc.) ---Yes List chronic conditions. ---HTN Guidelines Guideline Title Affirmed Question Affirmed Notes Nurse Date/Time Eilene Ghazi Time) Common Cold Cold with no complications (all triage questions negative) Mallie Mussel, RN, Alveta Heimlich 12/22/2014 12:41:12 PM Disp. Time Eilene Ghazi Time) Disposition Final User 12/22/2014 [place], RN, Ola Spurr Understands: Yes PLEASE NOTE: All timestamps contained within this report are represented as [place] Standard Time. CONFIDENTIALTY NOTICE: This fax transmission is intended only for the addressee. It contains information that is legally privileged, confidential or otherwise protected from use or disclosure. If you are not the intended recipient, you are strictly prohibited from reviewing, disclosing, copying using or disseminating any of this information or taking any action in reliance on or regarding this information. If you have received this fax in error, please notify us immediately by telephone so that we can arrange for its return to [place]. Phone: [phone], Toll-Free: [phone], Fax: [phone] Page: 2 of 2 Call Id: [id number] Disagree/Comply: Comply Care Advice Given Per Guideline HOME CARE: You should be able to treat this at home. FOR A STUFFY NOSE - USE NASAL WASHES: * Introduction: Saline (salt water) nasal irrigation (nasal wash) is an effective and simple home remedy for treating stuffy nose and sinus congestion. The nose can be irrigated by pouring, spraying, or squirting salt water into the nose and then letting it run back out. * Nasal mucus and discharge help wash viruses and bacteria out of the nose and sinuses. * Blowing your  nose helps clean out your nose. Use  a handkerchief or a paper tissue. HUMIDIFIER: If the air in your home is dry, use a humidifier. * The cold virus is present in your nasal secretions. * Cover your nose and mouth with a tissue when you sneeze or cough. * Wash your hands frequently with soap and water. * Fever 2-3 days * Cough 2-3 weeks. * Nasal discharge 7-14 days * Fever lasts over 3 days * Runny nose lasts over 10 days * You become short of breath * You become worse. After Care Instructions Given Call Event Type User Date / Time Description

## 2014-12-22 NOTE — Telephone Encounter (Signed)
Patient Name: Kristy [name]  DOB: [birth year]    Initial Comment Caller states she is having cold symptoms. Yellow mucus. She has had the symptoms for seven days, and not getting better.   Nurse Assessment  Nurse: Mallie Mussel, RN, Alveta Heimlich Date/Time Eilene Ghazi Time): 12/22/2014 12:39:49 PM  Confirm and document reason for call. If symptomatic, describe symptoms. ---Caller states that she has had a cough with runny nose for about 7 days now. She is coughing up yellow mucus. She denies difficulty breathing, but she does have some rattling in her chest. She denies fever.  Has the patient traveled out of the country within the last 30 days? ---No  Does the patient require triage? ---Yes  Related visit to physician within the last 2 weeks? ---No  Does the PT have any chronic conditions? (i.e. diabetes, asthma, etc.) ---Yes  List chronic conditions. ---HTN     Guidelines    Guideline Title Affirmed Question Affirmed Notes  Common Cold Cold with no complications (all triage questions negative)    Final Disposition User   [place], RN, Alveta Heimlich

## 2014-12-22 NOTE — Telephone Encounter (Signed)
agree

## 2015-02-05 ENCOUNTER — Ambulatory Visit: Payer: Medicare Other | Admitting: Family Medicine

## 2015-02-24 ENCOUNTER — Other Ambulatory Visit: Payer: Self-pay | Admitting: Family Medicine

## 2015-02-25 ENCOUNTER — Ambulatory Visit (INDEPENDENT_AMBULATORY_CARE_PROVIDER_SITE_OTHER): Payer: Medicare Other | Admitting: Family Medicine

## 2015-02-25 ENCOUNTER — Telehealth: Payer: Self-pay | Admitting: Family Medicine

## 2015-02-25 ENCOUNTER — Encounter: Payer: Self-pay | Admitting: Family Medicine

## 2015-02-25 VITALS — BP 120/80 | HR 77 | Temp 99.1°F | Ht 62.5 in | Wt 177.2 lb

## 2015-02-25 DIAGNOSIS — R509 Fever, unspecified: Secondary | ICD-10-CM | POA: Diagnosis not present

## 2015-02-25 DIAGNOSIS — J3089 Other allergic rhinitis: Secondary | ICD-10-CM | POA: Diagnosis not present

## 2015-02-25 DIAGNOSIS — J069 Acute upper respiratory infection, unspecified: Secondary | ICD-10-CM | POA: Diagnosis not present

## 2015-02-25 DIAGNOSIS — S76219A Strain of adductor muscle, fascia and tendon of unspecified thigh, initial encounter: Secondary | ICD-10-CM

## 2015-02-25 DIAGNOSIS — S39011A Strain of muscle, fascia and tendon of abdomen, initial encounter: Secondary | ICD-10-CM | POA: Diagnosis not present

## 2015-02-25 DIAGNOSIS — B9789 Other viral agents as the cause of diseases classified elsewhere: Secondary | ICD-10-CM

## 2015-02-25 LAB — POCT INFLUENZA A/B
Influenza A, POC: NEGATIVE
Influenza B, POC: NEGATIVE

## 2015-02-25 NOTE — Telephone Encounter (Signed)
PLEASE NOTE: All timestamps contained within this report are represented as [place] Standard Time. CONFIDENTIALTY NOTICE: This fax transmission is intended only for the addressee. It contains information that is legally privileged, confidential or otherwise protected from use or disclosure. If you are not the intended recipient, you are strictly prohibited from reviewing, disclosing, copying using or disseminating any of this information or taking any action in reliance on or regarding this information. If you have received this fax in error, please notify us immediately by telephone so that we can arrange for its return to [place]. Phone: [phone], Toll-Free: [phone], Fax: [phone] Page: 1 of 2 Call Id: [id number] [place] Patient Name: ELOINA ERGLE Gender: Female DOB: July 28, 1941 Age: 74 Y 15 M 27 D Return Phone Number: [phone] (Primary), [phone] (Secondary) Address: [address] City/State/Zip: [place] [address] Client [facility] - Client Client Site [place] [facility] - Day Contact Type Call Call Type Triage / Clinical Caller Name [place] Relationship To Patient Self Appointment Disposition EMR Patient Reports Appointment Already Scheduled Info pasted into Epic Yes Return Phone Number [phone] (Secondary) Chief Complaint Fever (non urgent symptom) (> THREE MONTHS) Initial Comment Caller states she has a temp of 100.9, nasal congestion. Yellow mucus. PreDisposition Call Doctor Nurse Assessment Nurse: Marcelline Deist, RN, Kermit Balo Date/Time Eilene Ghazi Time): 02/25/2015 11:04:40 AM Confirm and document reason for call. If symptomatic, describe symptoms. ---Caller states she has a temp of 100.9, nasal congestion. Yellow mucus. No cough. Symptoms started Monday. Has the patient traveled out of the country within the last  30 days? ---Not Applicable Does the patient require triage? ---Yes Related visit to physician within the last 2 weeks? ---No Does the PT have any chronic conditions? (i.e. diabetes, asthma, etc.) ---Yes List chronic conditions. ---BP rx Guidelines Guideline Title Affirmed Question Affirmed Notes Nurse Date/Time (Eastern Time) Weakness (Generalized) and Fatigue [1] MODERATE weakness (i.e., interferes with work, school, normal activities) AND [2] cause unknown (Exceptions: weakness with acute minor illness, or weakness from poor fluid intake) Marcelline Deist, RN, Lynda 02/25/2015 11:06:29 AM Disp. Time Eilene Ghazi Time) Disposition Final User PLEASE NOTE: All timestamps contained within this report are represented as [place] Standard Time. CONFIDENTIALTY NOTICE: This fax transmission is intended only for the addressee. It contains information that is legally privileged, confidential or otherwise protected from use or disclosure. If you are not the intended recipient, you are strictly prohibited from reviewing, disclosing, copying using or disseminating any of this information or taking any action in reliance on or regarding this information. If you have received this fax in error, please notify us immediately by telephone so that we can arrange for its return to [place]. Phone: [phone], Toll-Free: [phone], Fax: [phone] Page: 2 of 2 Call Id: [id number] 02/25/2015 11:09:34 AM See Physician within 4 Hours (or PCP triage) Yes Marcelline Deist, RN, Milana Kidney Understands: Yes Disagree/Comply: Comply Care Advice Given Per Guideline CARE ADVICE given per Weakness and Fatigue (Adult) guideline. SEE PHYSICIAN WITHIN 4 HOURS (or PCP triage): * IF NO PCP TRIAGE: You need to be seen. Go to _______________ (ED/UCC or office if it will be open) within the next 3 or 4 hours. Go sooner if you become worse. CALL BACK IF: * You become worse. BRING MEDICINES: * Please bring a list of your current medicines  when you go to see the doctor. After Care Instructions Given Call Event Type User Date / Time Description Comments  User: Donald Siva, RN Date/Time Eilene Ghazi Time): 02/25/2015 11:11:50 AM Caller's most concerning symptoms currently are weakness & congestion. States she even feels it moving up in to her ears. Referrals REFERRED TO PCP OFFICE

## 2015-02-25 NOTE — Assessment & Plan Note (Signed)
Treat with PT, NSAIDs, heat. No sign of hip pathology on exam. If not improving consider further eval or consult with Dr. Lorelei Pont [facility].

## 2015-02-25 NOTE — Assessment & Plan Note (Signed)
Neg flu.  Symptom care with mucinex DM.

## 2015-02-25 NOTE — Assessment & Plan Note (Addendum)
Likely at least in part due to allergies. Treat with nasal steroid spray and irrigation.

## 2015-02-25 NOTE — Telephone Encounter (Signed)
Patient Name: Kristy [name] DOB: [birth year] Initial Comment Caller states she has a temp of 100.9, nasal congestion. Yellow mucus. Nurse Assessment Nurse: Marcelline Deist, RN, Lynda Date/Time (Eastern Time): 02/25/2015 11:04:40 AM Confirm and document reason for call. If symptomatic, describe symptoms. ---Caller states she has a temp of 100.9, nasal congestion. Yellow mucus. No cough. Symptoms started Monday. Has the patient traveled out of the country within the last 30 days? ---Not Applicable Does the patient require triage? ---Yes Related visit to physician within the last 2 weeks? ---No Does the PT have any chronic conditions? (i.e. diabetes, asthma, etc.) ---Yes List chronic conditions. ---BP rx Guidelines Guideline Title Affirmed Question Affirmed Notes Weakness (Generalized) and Fatigue [1] MODERATE weakness (i.e., interferes with work, school, normal activities) AND [2] cause unknown (Exceptions: weakness with acute minor illness, or weakness from poor fluid intake) Final Disposition User See Physician within 4 Hours (or PCP triage) Marcelline Deist, RN, Lynda Comments Caller's most concerning symptoms currently are weakness & congestion. States she even feels it moving up in to her ears.

## 2015-02-25 NOTE — Patient Instructions (Addendum)
Start gentle stretching at at home. Can use ibuprofen 800 mg every 8 hours for pain and inflammation. Heat. Call to see Dr. Lorelei Pont if not improving as expected.   No flu.. Likely allergies and viral infeciton. Mucinex DM twice daily to break up mucus. Start OTC flonase 2 sprays per nostril daily. Call if not improving in 4-5 days, call sooner if shortness of breath.  Schedule medicare wellness with labs prior in next few months.

## 2015-02-25 NOTE — Progress Notes (Signed)
Subjective:    Patient ID: Kristy [name], female    DOB: 1941/08/30, 74 y.o.   MRN: [id number]  Cough This is a new problem. The current episode started in the past 7 days. The problem has been gradually worsening. The problem occurs constantly. The cough is productive of sputum. Associated symptoms include chills, ear congestion, a fever, myalgias, nasal congestion, postnasal drip and a sore throat. Pertinent negatives include no ear pain, headaches, rhinorrhea, shortness of breath or wheezing. Associated symptoms comments: 'feels like truck ran over her"  fatigue. Exacerbated by: not keeping her up at night. Risk factors: nonsmoker. Treatments tried: tylenol. The treatment provided mild relief. Her past medical history is significant for environmental allergies. There is no history of asthma, bronchiectasis, bronchitis, COPD, emphysema or pneumonia.  Fever  Associated symptoms include coughing and a sore throat. Pertinent negatives include no ear pain, headaches or wheezing.   She did get flu shot last fall.   Pain in right inner thigh x 2-3 weeks.  Occured after standing up.  Cannot cross legs without pain. Tylenol for pain.  No issue with walking.    Review of Systems  Constitutional: Positive for fever and chills.  HENT: Positive for postnasal drip and sore throat. Negative for ear pain and rhinorrhea.   Respiratory: Positive for cough. Negative for shortness of breath and wheezing.   Musculoskeletal: Positive for myalgias.  Allergic/Immunologic: Positive for environmental allergies.  Neurological: Negative for headaches.       Objective:   Physical Exam  Constitutional: Vital signs are normal. She appears well-developed and well-nourished. She is cooperative.  Non-toxic appearance. She does not appear ill. No distress.  HENT:  Head: Normocephalic.  Right Ear: Hearing, tympanic membrane, external ear and ear canal normal. Tympanic membrane is not erythematous, not  retracted and not bulging.  Left Ear: Hearing, tympanic membrane, external ear and ear canal normal. Tympanic membrane is not erythematous, not retracted and not bulging.  Nose: Mucosal edema and rhinorrhea present. Right sinus exhibits no maxillary sinus tenderness and no frontal sinus tenderness. Left sinus exhibits no maxillary sinus tenderness and no frontal sinus tenderness.  Mouth/Throat: Uvula is midline, oropharynx is clear and moist and mucous membranes are normal.  Eyes: Conjunctivae, EOM and lids are normal. Pupils are equal, round, and reactive to light. Lids are everted and swept, no foreign bodies found.  Neck: Trachea normal and normal range of motion. Neck supple. Carotid bruit is not present. No thyroid mass and no thyromegaly present.  Cardiovascular: Normal rate, regular rhythm, S1 normal, S2 normal, normal heart sounds, intact distal pulses and normal pulses.  Exam reveals no gallop and no friction rub.   No murmur heard. Pulmonary/Chest: Effort normal and breath sounds normal. No tachypnea. No respiratory distress. She has no decreased breath sounds. She has no wheezes. She has no rhonchi. She has no rales.  Musculoskeletal:       Right hip: She exhibits decreased range of motion and tenderness. She exhibits normal strength and no bony tenderness.       Left hip: She exhibits normal range of motion, normal strength, no tenderness and no bony tenderness.  No pain in hip, ttp over right groin/inner thigh  Neurological: She is alert.  Skin: Skin is warm, dry and intact. No rash noted.  Psychiatric: Her speech is normal and behavior is normal. Judgment normal. Her mood appears not anxious. Cognition and memory are normal. She does not exhibit a depressed mood.  Assessment & Plan:

## 2015-02-25 NOTE — Progress Notes (Signed)
Pre visit review using our clinic review tool, if applicable. No additional management support is needed unless otherwise documented below in the visit note. 

## 2015-02-25 NOTE — Telephone Encounter (Signed)
Pt has appt 02/25/2015 at 4 pm with Dr Diona Browner.

## 2015-03-05 ENCOUNTER — Ambulatory Visit: Payer: Medicare Other | Admitting: Family Medicine

## 2015-03-15 ENCOUNTER — Other Ambulatory Visit: Payer: Self-pay

## 2015-03-15 DIAGNOSIS — Z1231 Encounter for screening mammogram for malignant neoplasm of breast: Secondary | ICD-10-CM

## 2015-03-23 ENCOUNTER — Ambulatory Visit
Admission: RE | Admit: 2015-03-23 | Discharge: 2015-03-23 | Disposition: A | Discharge status: Home / Self Care | Payer: Medicare Other | Source: Ambulatory Visit

## 2015-03-23 DIAGNOSIS — Z1231 Encounter for screening mammogram for malignant neoplasm of breast: Secondary | ICD-10-CM | POA: Diagnosis not present

## 2015-04-14 ENCOUNTER — Encounter: Payer: Self-pay | Admitting: Family Medicine

## 2015-04-14 ENCOUNTER — Ambulatory Visit (INDEPENDENT_AMBULATORY_CARE_PROVIDER_SITE_OTHER): Payer: Medicare Other | Admitting: Family Medicine

## 2015-04-14 VITALS — BP 124/80 | HR 79 | Temp 98.5°F | Ht 62.5 in | Wt 179.0 lb

## 2015-04-14 DIAGNOSIS — S39011D Strain of muscle, fascia and tendon of abdomen, subsequent encounter: Secondary | ICD-10-CM | POA: Diagnosis not present

## 2015-04-14 DIAGNOSIS — S76219D Strain of adductor muscle, fascia and tendon of unspecified thigh, subsequent encounter: Secondary | ICD-10-CM

## 2015-04-14 NOTE — Patient Instructions (Signed)

## 2015-04-14 NOTE — Progress Notes (Signed)
Dr. Frederico Hamman T. Dakarai Mcglocklin, MD, [facility] [place], [address] Phone: [phone] Fax: [phone]  04/14/2015  Patient: Kristy [name], MRN: [id number], DOB: August 08, 1941, 74 y.o.  Primary Physician:  Eliezer Lofts, MD  Chief Complaint: Thigh Pain  Subjective:   Kristy [name] is a 74 y.o. very pleasant female patient who presents with the following:  Right hip pain.  One morning was getting some pain with moving - has been about a month. She recalls a specific incident where on the medial aspect of her thigh she had some exquisite pain, and ever since then over the last month the patient has been having some pain.  It has improved somewhat, but she is still having quite a bit limitation.  Has tried to do some exercises and stretch and rehab. Dr. Ezzie Dural gave her a home exercise program.  She denies any specific back pain and any kind of specific deep groin pain.  Past Medical History, Surgical History, Social History, Family History, Problem List, Medications, and Allergies have been reviewed and updated if relevant.  GEN: No fevers, chills. Nontoxic. Primarily MSK c/o today. MSK: Detailed in the HPI GI: tolerating PO intake without difficulty Neuro: No numbness, parasthesias, or tingling associated. Otherwise the pertinent positives of the ROS are noted above.   Objective:   BP 124/80 mmHg  Pulse 79  Temp(Src) 98.5 F (36.9 C) (Oral)  Ht 5' 2.5" (1.588 m)  Wt 179 lb (81.194 kg)  BMI 32.20 kg/m2   GEN: WDWN, NAD, Non-toxic, Alert & Oriented x 3 HEENT: Atraumatic, Normocephalic.  Ears and Nose: No external deformity. EXTR: No clubbing/cyanosis/edema NEURO: Normal gait.  PSYCH: Normally interactive. Conversant. Not depressed or anxious appearing.  Calm demeanor.   HIP EXAM: SIDE: R ROM: Abduction, Flexion, Internal and External range of motion: full, some pain with terminal EROM Pain with terminal IROM and  EROM: mild GTB: mild R GTB SLR: NEG Knees: No effusion FABER: tender REVERSE FABER: NT, neg Piriformis: NT at direct palpation Str: flexion: 5/5 abduction: 5/5 adduction: 4/5, tender Sartorius isolation is tender     Radiology:  Assessment and Plan:   Groin strain, subsequent encounter - Plan: Ambulatory referral to Physical Therapy  Abductor and sartorius strain.  She is making some progress, but not quite as quickly as desired.  This appears to be all muscular.  Her hip moves great.  Formal physical therapy for additional modalities and education and rehabilitation.  Follow-up: No Follow-up on file.  New Prescriptions   No medications on file   Orders Placed This Encounter  Procedures  . Ambulatory referral to Physical Therapy    Signed,  Frederico Hamman T. Adline Kirshenbaum, MD   Patient's Medications  New Prescriptions   No medications on file  Previous Medications   ASPIRIN 81 MG EC TABLET    Take 81 mg by mouth daily.     CHLORTHALIDONE (HYGROTON) 25 MG TABLET    TAKE 1 TABLET (25 MG TOTAL) BY MOUTH DAILY.   CRANBERRY EXTRACT PO    Take by mouth as needed.    METOPROLOL SUCCINATE (TOPROL-XL) 100 MG 24 HR TABLET    TAKE 1 TABLET (100 MG TOTAL) BY MOUTH DAILY. TAKE WITH OR IMMEDIATELY FOLLOWING A MEAL.   MULTIPLE VITAMIN (MULTIVITAMIN) TABLET    Take 2 tablets by mouth daily.   Modified Medications   No medications on file  Discontinued Medications   No medications on file

## 2015-04-14 NOTE — Progress Notes (Signed)
Pre visit review using our clinic review tool, if applicable. No additional management support is needed unless otherwise documented below in the visit note. 

## 2015-05-13 ENCOUNTER — Telehealth: Payer: Self-pay | Admitting: Family Medicine

## 2015-05-13 ENCOUNTER — Other Ambulatory Visit (INDEPENDENT_AMBULATORY_CARE_PROVIDER_SITE_OTHER): Payer: Medicare Other

## 2015-05-13 DIAGNOSIS — E785 Hyperlipidemia, unspecified: Secondary | ICD-10-CM

## 2015-05-13 DIAGNOSIS — R7309 Other abnormal glucose: Secondary | ICD-10-CM | POA: Diagnosis not present

## 2015-05-13 DIAGNOSIS — R7303 Prediabetes: Secondary | ICD-10-CM

## 2015-05-13 DIAGNOSIS — E0590 Thyrotoxicosis, unspecified without thyrotoxic crisis or storm: Secondary | ICD-10-CM

## 2015-05-13 LAB — COMPREHENSIVE METABOLIC PANEL
ALT: 15 U/L (ref 0–35)
AST: 18 U/L (ref 0–37)
Albumin: 3.9 g/dL (ref 3.5–5.2)
Alkaline Phosphatase: 60 U/L (ref 39–117)
BUN: 13 mg/dL (ref 6–23)
CO2: 28 mEq/L (ref 19–32)
Calcium: 9.5 mg/dL (ref 8.4–10.5)
Chloride: 107 mEq/L (ref 96–112)
Creatinine, Ser: 0.7 mg/dL (ref 0.40–1.20)
GFR: 105.8 mL/min (ref >60.00)
Glucose, Bld: 112 mg/dL — ABNORMAL HIGH (ref 70–99)
Potassium: 3.7 mEq/L (ref 3.5–5.1)
Sodium: 141 mEq/L (ref 135–145)
Total Bilirubin: 0.4 mg/dL (ref 0.2–1.2)
Total Protein: 7 g/dL (ref 6.0–8.3)

## 2015-05-13 LAB — LIPID PANEL
Cholesterol: 220 mg/dL — ABNORMAL HIGH (ref 0–200)
HDL: 48.4 mg/dL (ref >39.00)
LDL Cholesterol: 144 mg/dL — ABNORMAL HIGH (ref 0–99)
NonHDL: 171.6
Total CHOL/HDL Ratio: 5
Triglycerides: 140 mg/dL (ref 0.0–149.0)
VLDL: 28 mg/dL (ref 0.0–40.0)

## 2015-05-13 LAB — HEMOGLOBIN A1C: Hgb A1c MFr Bld: 5.8 % (ref 4.6–6.5)

## 2015-05-13 NOTE — Telephone Encounter (Signed)
-----   Message from Ellamae Sia sent at 05/05/2015  3:47 PM EDT ----- Regarding: Lab orders for Thursday, 6.23.16 Patient is scheduled for CPX labs, please order future labs, Thanks , Karna Christmas

## 2015-05-20 ENCOUNTER — Encounter: Payer: Self-pay | Admitting: Family Medicine

## 2015-05-20 ENCOUNTER — Ambulatory Visit (INDEPENDENT_AMBULATORY_CARE_PROVIDER_SITE_OTHER): Payer: Medicare Other | Admitting: Family Medicine

## 2015-05-20 VITALS — BP 132/84 | HR 65 | Temp 97.8°F | Ht 62.5 in | Wt 178.0 lb

## 2015-05-20 DIAGNOSIS — I10 Essential (primary) hypertension: Secondary | ICD-10-CM

## 2015-05-20 DIAGNOSIS — Z7189 Other specified counseling: Secondary | ICD-10-CM

## 2015-05-20 DIAGNOSIS — E785 Hyperlipidemia, unspecified: Secondary | ICD-10-CM

## 2015-05-20 DIAGNOSIS — Z0000 Encounter for general adult medical examination without abnormal findings: Secondary | ICD-10-CM | POA: Diagnosis not present

## 2015-05-20 DIAGNOSIS — R7309 Other abnormal glucose: Secondary | ICD-10-CM

## 2015-05-20 DIAGNOSIS — R7303 Prediabetes: Secondary | ICD-10-CM

## 2015-05-20 NOTE — Assessment & Plan Note (Signed)
Encouraged exercise, weight loss, healthy eating habits. ? ?

## 2015-05-20 NOTE — Patient Instructions (Addendum)
Start red yeast rice 600 mg x 2 tabs twice daily to lower cholesterol.  Continue working on low carb and low cholesterol diet.  Increase exercise and weight loss as you are able.

## 2015-05-20 NOTE — Progress Notes (Signed)
Pre visit review using our clinic review tool, if applicable. No additional management support is needed unless otherwise documented below in the visit note. 

## 2015-05-20 NOTE — Progress Notes (Signed)
I have personally reviewed the Medicare Annual Wellness questionnaire and have noted 1. The patient's medical and social history 2. Their use of alcohol, tobacco or illicit drugs 3. Their current medications and supplements 4. The patient's functional ability including ADL's, fall risks, home safety risks and hearing or visual             impairment. 5. Diet and physical activities 6. Evidence for depression or mood disorders 7.         Updated provider list Cognitive evaluation was performed and recorded on pt medicare questionnaire form. The patients weight, height, BMI and visual acuity have been recorded in the chart  I have made referrals, counseling and provided education to the patient based review of the above and I have provided the pt with a written personalized care plan for preventive services.   Occ sharp pains in head, no associated symptoms. Occuring every few weeks. Occ feeling in brain fog.  Poor sleep, has improved with not eating late.  Hypertension. Well controlled at home  Occ spikes: If she takes metoprolol twice daily BP is better controlled. Has history of fibromuscular dysplasia But not renal fibromuscular dysplasia. No flushing associated with Bp increase.. Just headache In back. BP Readings from Last 3 Encounters:  05/20/15 132/84  04/14/15 124/80  02/25/15 120/80  Using medication without problems or lightheadedness: None Chest pain with exertion:None Edema:None Short of breath:None Average home BPs: See above Other issues:  Elevated Cholesterol: LDL no longer at goal < 130 on no medication. She has been working some on diet changes since.  Trig lower than previous.  Lab Results  Component Value Date   CHOL 220* 05/13/2015   HDL 48.40 05/13/2015   LDLCALC 144* 05/13/2015   LDLDIRECT 140.3 03/26/2013   TRIG 140.0 05/13/2015   CHOLHDL 5 05/13/2015  Diet compliance: Has tried to avoid fried foods.  Exercise: daily x 5 days a week  elliptical. Other complaints:   Prediabetes: stable. Body mass index is 32.02 kg/(m^2).  Thyroid stable at last check. Lab Results  Component Value Date   TSH 1.79 12/08/2014    Prediabetes:  Has been working on low sugar diet.  Review of Systems  Constitutional: Negative for fever, fatigue and unexpected weight change.  HENT: Negative for ear pain, congestion, sore throat, sneezing, trouble swallowing and sinus pressure.  Eyes: Negative for pain and itching.  Respiratory: negative for shortness of breath. Negative for cough and wheezing.  Cardiovascular: Negative for chest pain, palpitations and leg swelling.  Gastrointestinal: Negative for nausea, abdominal pain, diarrhea, constipation and blood in stool.  Genitourinary: Negative for dysuria, hematuria, vaginal discharge, difficulty urinating and menstrual problem.  Skin: Negative for rash.  Neurological: Negative for syncope, weakness, light-headedness, numbness and headaches.  Psychiatric/Behavioral: Negative for confusion and dysphoric mood. The patient is not nervous/anxious.  Objective:   Physical Exam  Constitutional: Vital signs are normal. She appears well-developed and well-nourished. She is cooperative. Non-toxic appearance. She does not appear ill. No distress.  HENT:  Head: Normocephalic.  Right Ear: Hearing, tympanic membrane, external ear and ear canal normal.  Left Ear: Hearing, tympanic membrane, external ear and ear canal normal.  Nose: Nose normal.  Eyes: Conjunctivae, EOM and lids are normal. Pupils are equal, round, and reactive to light. No foreign bodies found.  Neck: Trachea normal and normal range of motion. Neck supple. Carotid bruit is not present. No mass and no thyromegaly present.  Cardiovascular: Normal rate, regular rhythm, S1 normal, S2 normal, normal  heart sounds and intact distal pulses. Exam reveals no gallop.  No murmur heard.  Pulmonary/Chest: Effort normal and breath  sounds normal. No respiratory distress. She has no wheezes. She has no rhonchi. She has no rales.  Abdominal: Soft. Normal appearance and bowel sounds are normal. She exhibits no distension, no fluid wave, no abdominal bruit and no mass. There is no hepatosplenomegaly. There is no tenderness. There is no rebound, no guarding and no CVA tenderness. No hernia.  Genitourinary: No breast swelling, tenderness, discharge or bleeding.  Lymphadenopathy:  She has no cervical adenopathy.  She has no axillary adenopathy.  Neurological: She is alert. She has normal strength. No cranial nerve deficit or sensory deficit.  Skin: Skin is warm, dry and intact. No rash noted.  Psychiatric: Her speech is normal and behavior is normal. Judgment normal. Her mood appears not anxious. Cognition and memory are normal. She does not exhibit a depressed mood.  Assessment & Plan:   The patient's preventative maintenance and recommended screening tests for an annual wellness exam were reviewed in full today.  Brought up to date unless services declined.  Counselled on the importance of diet, exercise, and its role in overall health and mortality.  The patient's FH and SH was reviewed, including their home life, tobacco status, and drug and alcohol status.   Vaccines: uptodate Td and Pneumovax, prevnar considering shingles Colon:10/2013, Dr. Fuller Plan, Repeat in 5 years. DVE/pap: total hysterectomy  Mammo: 03/2015 right DEXA:01/2014 osteoporosis, she refuses bisphosphonate, she is taking vit D. She is now sexually active. Not interested in STD testing.

## 2015-07-02 NOTE — Assessment & Plan Note (Signed)
Well controlled. Continue current medication.  

## 2015-07-02 NOTE — Assessment & Plan Note (Signed)
Start red yeast rice 600 mg x 2 tabs twice daily to lower cholesterol. Recheck at next lab draw.

## 2015-08-18 ENCOUNTER — Telehealth: Payer: Self-pay | Admitting: Family Medicine

## 2015-08-18 DIAGNOSIS — E785 Hyperlipidemia, unspecified: Secondary | ICD-10-CM

## 2015-08-18 DIAGNOSIS — E0590 Thyrotoxicosis, unspecified without thyrotoxic crisis or storm: Secondary | ICD-10-CM

## 2015-08-18 DIAGNOSIS — R7303 Prediabetes: Secondary | ICD-10-CM

## 2015-08-18 DIAGNOSIS — M810 Age-related osteoporosis without current pathological fracture: Secondary | ICD-10-CM

## 2015-08-18 NOTE — Telephone Encounter (Signed)
-----   Message from Ellamae Sia sent at 08/09/2015  4:33 PM EDT ----- Regarding: Lab orders for Friday. 9.30.16 Lab orders for a 3 month follow up appt.

## 2015-08-20 ENCOUNTER — Other Ambulatory Visit: Payer: Medicare Other

## 2015-08-31 ENCOUNTER — Other Ambulatory Visit (INDEPENDENT_AMBULATORY_CARE_PROVIDER_SITE_OTHER): Payer: Medicare Other

## 2015-08-31 DIAGNOSIS — E785 Hyperlipidemia, unspecified: Secondary | ICD-10-CM | POA: Diagnosis not present

## 2015-08-31 DIAGNOSIS — M810 Age-related osteoporosis without current pathological fracture: Secondary | ICD-10-CM

## 2015-08-31 LAB — COMPREHENSIVE METABOLIC PANEL
ALT: 12 U/L (ref 0–35)
AST: 15 U/L (ref 0–37)
Albumin: 3.9 g/dL (ref 3.5–5.2)
Alkaline Phosphatase: 67 U/L (ref 39–117)
BUN: 11 mg/dL (ref 6–23)
CO2: 29 mEq/L (ref 19–32)
Calcium: 9.6 mg/dL (ref 8.4–10.5)
Chloride: 106 mEq/L (ref 96–112)
Creatinine, Ser: 0.72 mg/dL (ref 0.40–1.20)
GFR: 102.33 mL/min (ref >60.00)
Glucose, Bld: 100 mg/dL — ABNORMAL HIGH (ref 70–99)
Potassium: 3.7 mEq/L (ref 3.5–5.1)
Sodium: 142 mEq/L (ref 135–145)
Total Bilirubin: 0.3 mg/dL (ref 0.2–1.2)
Total Protein: 7 g/dL (ref 6.0–8.3)

## 2015-08-31 LAB — VITAMIN D 25 HYDROXY (VIT D DEFICIENCY, FRACTURES): VITD: 28.12 ng/mL — ABNORMAL LOW (ref 30.00–100.00)

## 2015-08-31 LAB — LIPID PANEL
Cholesterol: 203 mg/dL — ABNORMAL HIGH (ref 0–200)
HDL: 50 mg/dL (ref >39.00)
LDL Cholesterol: 125 mg/dL — ABNORMAL HIGH (ref 0–99)
NonHDL: 153.46
Total CHOL/HDL Ratio: 4
Triglycerides: 144 mg/dL (ref 0.0–149.0)
VLDL: 28.8 mg/dL (ref 0.0–40.0)

## 2015-09-03 ENCOUNTER — Encounter: Payer: Self-pay | Admitting: Family Medicine

## 2015-09-03 ENCOUNTER — Ambulatory Visit (INDEPENDENT_AMBULATORY_CARE_PROVIDER_SITE_OTHER): Payer: Medicare Other | Admitting: Family Medicine

## 2015-09-03 VITALS — BP 119/65 | HR 61 | Temp 98.1°F | Ht 62.5 in | Wt 177.0 lb

## 2015-09-03 DIAGNOSIS — K582 Mixed irritable bowel syndrome: Secondary | ICD-10-CM

## 2015-09-03 DIAGNOSIS — K219 Gastro-esophageal reflux disease without esophagitis: Secondary | ICD-10-CM

## 2015-09-03 DIAGNOSIS — Z23 Encounter for immunization: Secondary | ICD-10-CM

## 2015-09-03 MED ORDER — PANTOPRAZOLE SODIUM 40 MG PO TBEC: 40.0000 mg | DELAYED_RELEASE_TABLET | Freq: Every day | ORAL | Status: DC

## 2015-09-03 NOTE — Progress Notes (Signed)
   Subjective:    Patient ID: Kristy [name], female    DOB: 11-22-1940, 74 y.o.   MRN: [id number]  HPI  74 year old female with history of GERD, anxiety, and IBS presents with bloating and gas.  She reported fasting x 2 days.. Felt weak. She has been having constipation, stringy BMs. After stopped fast.. Returned to normal. No blood in stool. Occ goes to watery stool three to 4 times a day.  Cramping and abd pain, mild after BM resolves.  Has a lot of gas after greens, beans, milk. Occ reflux, heartburn resulting in emesis.  She has not taken probiotic regularly Has tried zantac. Nervous about prilosec.  At last OV on thyroid control was stable.  S/P total hysterectomy.  Colonoscopy: 2014  Dr. Fuller Plan.  Social History /Family History/Past Medical History reviewed and updated if needed.   Review of Systems  Constitutional: Negative for fever and fatigue.  HENT: Negative for ear pain.   Eyes: Negative for pain.  Respiratory: Negative for chest tightness and shortness of breath.   Cardiovascular: Negative for chest pain, palpitations and leg swelling.  Gastrointestinal: Negative for abdominal pain.  Genitourinary: Negative for dysuria.       Objective:   Physical Exam  Constitutional: Vital signs are normal. She appears well-developed and well-nourished. She is cooperative.  Non-toxic appearance. She does not appear ill. No distress.  HENT:  Head: Normocephalic.  Right Ear: Hearing, tympanic membrane, external ear and ear canal normal. Tympanic membrane is not erythematous, not retracted and not bulging.  Left Ear: Hearing, tympanic membrane, external ear and ear canal normal. Tympanic membrane is not erythematous, not retracted and not bulging.  Nose: No mucosal edema or rhinorrhea. Right sinus exhibits no maxillary sinus tenderness and no frontal sinus tenderness. Left sinus exhibits no maxillary sinus tenderness and no frontal sinus tenderness.  Mouth/Throat: Uvula is  midline, oropharynx is clear and moist and mucous membranes are normal.  Eyes: Conjunctivae, EOM and lids are normal. Pupils are equal, round, and reactive to light. Lids are everted and swept, no foreign bodies found.  Neck: Trachea normal and normal range of motion. Neck supple. Carotid bruit is not present. No thyroid mass and no thyromegaly present.  Cardiovascular: Normal rate, regular rhythm, S1 normal, S2 normal, normal heart sounds, intact distal pulses and normal pulses.  Exam reveals no gallop and no friction rub.   No murmur heard. Pulmonary/Chest: Effort normal and breath sounds normal. No tachypnea. No respiratory distress. She has no decreased breath sounds. She has no wheezes. She has no rhonchi. She has no rales.  Abdominal: Soft. Normal appearance and bowel sounds are normal. There is no tenderness.  Neurological: She is alert.  Skin: Skin is warm, dry and intact. No rash noted.  Psychiatric: Her speech is normal and behavior is normal. Judgment and thought content normal. Her mood appears not anxious. Cognition and memory are normal. She does not exhibit a depressed mood.          Assessment & Plan:

## 2015-09-03 NOTE — Progress Notes (Signed)
Pre visit review using our clinic review tool, if applicable. No additional management support is needed unless otherwise documented below in the visit note. 

## 2015-09-03 NOTE — Patient Instructions (Addendum)
Start daily metamucil/benefiber, gradually increase.  Start proboitic daily.  Stop at lab for [facility] pantoprazole 40 mg daily. Follow up in 2 weeks if not improving.

## 2015-09-06 ENCOUNTER — Other Ambulatory Visit: Payer: Self-pay | Admitting: Family Medicine

## 2015-09-06 DIAGNOSIS — K219 Gastro-esophageal reflux disease without esophagitis: Secondary | ICD-10-CM | POA: Diagnosis not present

## 2015-09-06 DIAGNOSIS — K582 Mixed irritable bowel syndrome: Secondary | ICD-10-CM | POA: Diagnosis not present

## 2015-09-07 LAB — HELICOBACTER PYLORI  SPECIAL ANTIGEN: H. PYLORI Antigen: NOT DETECTED

## 2015-09-17 ENCOUNTER — Ambulatory Visit: Payer: Medicare Other | Admitting: Family Medicine

## 2015-09-21 NOTE — Assessment & Plan Note (Signed)
Start daily metamucil/benefiber, gradually increase.  Start proboitic daily.

## 2015-09-21 NOTE — Assessment & Plan Note (Signed)
Poor control. Stop at lab for hpylori . Start pantoprazole 40 mg daily. Follow up in 2 weeks if not improving.

## 2015-12-17 ENCOUNTER — Ambulatory Visit (INDEPENDENT_AMBULATORY_CARE_PROVIDER_SITE_OTHER): Payer: Medicare Other | Admitting: Family Medicine

## 2015-12-17 ENCOUNTER — Encounter: Payer: Self-pay | Admitting: Family Medicine

## 2015-12-17 VITALS — BP 162/82 | HR 68 | Temp 98.1°F | Ht 62.5 in | Wt 180.0 lb

## 2015-12-17 DIAGNOSIS — H8113 Benign paroxysmal vertigo, bilateral: Secondary | ICD-10-CM

## 2015-12-17 DIAGNOSIS — N951 Menopausal and female climacteric states: Secondary | ICD-10-CM | POA: Diagnosis not present

## 2015-12-17 DIAGNOSIS — R232 Flushing: Secondary | ICD-10-CM

## 2015-12-17 DIAGNOSIS — H8110 Benign paroxysmal vertigo, unspecified ear: Secondary | ICD-10-CM | POA: Insufficient documentation

## 2015-12-17 NOTE — Progress Notes (Signed)
Pre visit review using our clinic review tool, if applicable. No additional management support is needed unless otherwise documented below in the visit note. 

## 2015-12-17 NOTE — Patient Instructions (Signed)
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  Start home PT exercises. Can use  Antivert, meclizine OTC for severe symptoms.  Can try flonase 2 sprays per nostril daily.  Metoprolol likely causing excessive sweating.

## 2015-12-17 NOTE — Progress Notes (Signed)
Subjective:    Patient ID: Kristy [name], female    DOB: [birth year], 75 y.o.   MRN: [id number]  HPI  75 year old female pt with history of labile HTN, fibromuscular hyperplasia, anxiety presents with new dizziness and excessive sweating.  5 days ago at rest she had 2 sharp pains in right temple. BP was 145/75.  Room spinning when lying back.  Drank some water.  When got out of be in AM.. balance off, if bent over dizzy, equilibrium off.  no new numbness and neuro changes. No weakness.  Well controlled usually. BP Readings from Last 3 Encounters:  12/17/15 162/82  09/03/15 119/65  05/20/15 132/84   No ear pain, cold symptoms. 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   She  plans on seeing vascular MD for follow up on fibromusc dysplasia.  Occ excessive sweating and hot flashes. Not new. nml TSH, nml catecholamines, cbc nml For same issue 11/2014  May be SE to the metoprolol.      Review of Systems  Constitutional: Negative for fever and fatigue.  HENT: Negative for ear pain.   Eyes: Negative for pain.  Respiratory: Negative for chest tightness and shortness of breath.   Cardiovascular: Negative for chest pain, palpitations and leg swelling.  Gastrointestinal: Negative for abdominal pain.  Genitourinary: Negative for dysuria.       Objective:   Physical Exam  Constitutional: She is oriented to person, place, and time. Vital signs are normal. She appears well-developed and well-nourished. She is cooperative.  Non-toxic appearance. She does not appear ill. No distress.  HENT:  Head: Normocephalic.  Right Ear: Hearing, external ear and ear canal normal. Tympanic membrane is not erythematous, not retracted and not bulging. A middle ear effusion is present.  Left Ear: Hearing, external ear and ear canal normal. Tympanic membrane is not erythematous, not retracted and not bulging. A middle ear effusion is present.  Nose: No mucosal edema or rhinorrhea.  Right sinus exhibits no maxillary sinus tenderness and no frontal sinus tenderness. Left sinus exhibits no maxillary sinus tenderness and no frontal sinus tenderness.  Mouth/Throat: Uvula is midline, oropharynx is clear and moist and mucous membranes are normal.  Eyes: Conjunctivae, EOM and lids are normal. Pupils are equal, round, and reactive to light. Lids are everted and swept, no foreign bodies found.  Neck: Trachea normal and normal range of motion. Neck supple. Carotid bruit is not present. No thyroid mass and no thyromegaly present.  Cardiovascular: Normal rate, regular rhythm, S1 normal, S2 normal, normal heart sounds, intact distal pulses and normal pulses.  Exam reveals no gallop and no friction rub.   No murmur heard. Pulmonary/Chest: Effort normal and breath sounds normal. No tachypnea. No respiratory distress. She has no decreased breath sounds. She has no wheezes. She has no rhonchi. She has no rales.  Abdominal: Soft. Normal appearance and bowel sounds are normal. There is no tenderness.  Neurological: She is alert and oriented to person, place, and time. She has normal strength and normal reflexes. No cranial nerve deficit or sensory deficit. She exhibits normal muscle tone. She displays a negative Romberg sign. Coordination and gait normal. GCS eye subscore is 4. GCS verbal subscore is 5. GCS motor subscore is 6.  Nml cerebellar exam   No papilledema  Skin: Skin is warm, dry and intact. No rash noted.  Psychiatric: She has a normal mood and affect. Her speech is normal and behavior is normal. Judgment and thought content  normal. Her mood appears not anxious. Cognition and memory are normal. Cognition and memory are not impaired. She does not exhibit a depressed mood. She exhibits normal recent memory and normal remote memory.          Assessment & Plan:

## 2015-12-17 NOTE — Assessment & Plan Note (Signed)
Likely secondary to  metoprolol. Lab eval negative. Tolerable per pt.

## 2015-12-17 NOTE — Assessment & Plan Note (Signed)
Nmll neuro exam. No clear sign of TIA/CVA. Most typical of BPPV.  tart home desensitization exercises. Can use meclizine for severe symptoms.  Start some flonase for possible ETD contributing to symptoms with fluid behind TMs.

## 2016-01-21 ENCOUNTER — Other Ambulatory Visit: Payer: Self-pay | Admitting: Family Medicine

## 2016-02-03 ENCOUNTER — Telehealth: Payer: Self-pay | Admitting: Family Medicine

## 2016-02-03 NOTE — Telephone Encounter (Signed)
Sent to [facility] °

## 2016-02-03 NOTE — Telephone Encounter (Signed)
Patient Name: Kristy [name] DOB: 1941/06/03 Initial Comment Caller states blew her nose this morning and lost her hearing, somewhat coming back Nurse Assessment Nurse: Ronnald Ramp, RN, Miranda Date/Time (Eastern Time): 02/03/2016 10:11:43 AM Confirm and document reason for call. If symptomatic, describe symptoms. You must click the next button to save text entered. ---Caller states she blew her nose this morning and felt congestion in her ear and trouble hearing. Has the patient traveled out of the country within the last 30 days? ---Not Applicable Does the patient have any new or worsening symptoms? ---Yes Will a triage be completed? ---Yes Related visit to physician within the last 2 weeks? ---No Does the PT have any chronic conditions? (i.e. diabetes, asthma, etc.) ---Yes List chronic conditions. ---HTN, FMD Is this a behavioral health or substance abuse call? ---No Guidelines Guideline Title Affirmed Question Affirmed Notes Ear - Congestion Ear congestion present > 48 hours Final Disposition User See PCP When Office is Open (within 3 days) Ronnald Ramp, RN, Miranda Comments No appt available with PCP. Appt scheduled for tomorrow 02/04/16 at 2:15 with [facility]. Referrals REFERRED TO PCP OFFICE Disagree/Comply: Comply

## 2016-02-03 NOTE — Telephone Encounter (Signed)
I will see her then  

## 2016-02-04 ENCOUNTER — Ambulatory Visit: Payer: Self-pay | Admitting: Family Medicine

## 2016-03-21 DIAGNOSIS — M25561 Pain in right knee: Secondary | ICD-10-CM | POA: Diagnosis not present

## 2016-04-06 ENCOUNTER — Other Ambulatory Visit: Payer: Self-pay

## 2016-04-06 DIAGNOSIS — Z1231 Encounter for screening mammogram for malignant neoplasm of breast: Secondary | ICD-10-CM

## 2016-04-25 ENCOUNTER — Ambulatory Visit
Admission: RE | Admit: 2016-04-25 | Discharge: 2016-04-25 | Disposition: A | Discharge status: Home / Self Care | Payer: Medicare Other | Source: Ambulatory Visit

## 2016-04-25 DIAGNOSIS — Z1231 Encounter for screening mammogram for malignant neoplasm of breast: Secondary | ICD-10-CM

## 2016-04-26 ENCOUNTER — Other Ambulatory Visit: Payer: Self-pay | Admitting: Family Medicine

## 2016-05-25 ENCOUNTER — Other Ambulatory Visit (INDEPENDENT_AMBULATORY_CARE_PROVIDER_SITE_OTHER): Payer: Medicare Other

## 2016-05-25 ENCOUNTER — Telehealth: Payer: Self-pay | Admitting: Family Medicine

## 2016-05-25 DIAGNOSIS — E0590 Thyrotoxicosis, unspecified without thyrotoxic crisis or storm: Secondary | ICD-10-CM

## 2016-05-25 DIAGNOSIS — M810 Age-related osteoporosis without current pathological fracture: Secondary | ICD-10-CM | POA: Diagnosis not present

## 2016-05-25 DIAGNOSIS — R7303 Prediabetes: Secondary | ICD-10-CM | POA: Diagnosis not present

## 2016-05-25 DIAGNOSIS — E785 Hyperlipidemia, unspecified: Secondary | ICD-10-CM

## 2016-05-25 LAB — HEMOGLOBIN A1C: Hgb A1c MFr Bld: 5.9 % (ref 4.6–6.5)

## 2016-05-25 LAB — COMPREHENSIVE METABOLIC PANEL
ALT: 12 U/L (ref 0–35)
AST: 14 U/L (ref 0–37)
Albumin: 4.1 g/dL (ref 3.5–5.2)
Alkaline Phosphatase: 70 U/L (ref 39–117)
BUN: 13 mg/dL (ref 6–23)
CO2: 27 mEq/L (ref 19–32)
Calcium: 10 mg/dL (ref 8.4–10.5)
Chloride: 106 mEq/L (ref 96–112)
Creatinine, Ser: 0.77 mg/dL (ref 0.40–1.20)
GFR: 94.5 mL/min (ref >60.00)
Glucose, Bld: 117 mg/dL — ABNORMAL HIGH (ref 70–99)
Potassium: 3.9 mEq/L (ref 3.5–5.1)
Sodium: 140 mEq/L (ref 135–145)
Total Bilirubin: 0.5 mg/dL (ref 0.2–1.2)
Total Protein: 7.1 g/dL (ref 6.0–8.3)

## 2016-05-25 LAB — T3, FREE: T3, Free: 2.8 pg/mL (ref 2.3–4.2)

## 2016-05-25 LAB — LIPID PANEL
Cholesterol: 212 mg/dL — ABNORMAL HIGH (ref 0–200)
HDL: 52 mg/dL (ref >39.00)
LDL Cholesterol: 135 mg/dL — ABNORMAL HIGH (ref 0–99)
NonHDL: 159.7
Total CHOL/HDL Ratio: 4
Triglycerides: 125 mg/dL (ref 0.0–149.0)
VLDL: 25 mg/dL (ref 0.0–40.0)

## 2016-05-25 LAB — TSH: TSH: 2.44 u[IU]/mL (ref 0.35–4.50)

## 2016-05-25 LAB — VITAMIN D 25 HYDROXY (VIT D DEFICIENCY, FRACTURES): VITD: 35.29 ng/mL (ref 30.00–100.00)

## 2016-05-25 LAB — T4, FREE: Free T4: 0.74 ng/dL (ref 0.60–1.60)

## 2016-05-25 NOTE — Telephone Encounter (Signed)
-----   Message from Ellamae Sia sent at 05/17/2016  6:35 PM EDT ----- Regarding: Lab orders for Thursday, 7.6.17 Patient is scheduled for CPX labs, please order future labs, Thanks , Karna Christmas

## 2016-06-01 ENCOUNTER — Ambulatory Visit (INDEPENDENT_AMBULATORY_CARE_PROVIDER_SITE_OTHER): Payer: Medicare Other | Admitting: Family Medicine

## 2016-06-01 ENCOUNTER — Encounter: Payer: Self-pay | Admitting: Family Medicine

## 2016-06-01 VITALS — BP 110/80 | HR 64 | Ht 62.0 in | Wt 180.0 lb

## 2016-06-01 DIAGNOSIS — E785 Hyperlipidemia, unspecified: Secondary | ICD-10-CM

## 2016-06-01 DIAGNOSIS — R232 Flushing: Secondary | ICD-10-CM

## 2016-06-01 DIAGNOSIS — I10 Essential (primary) hypertension: Secondary | ICD-10-CM | POA: Diagnosis not present

## 2016-06-01 DIAGNOSIS — G4700 Insomnia, unspecified: Secondary | ICD-10-CM

## 2016-06-01 DIAGNOSIS — F5104 Psychophysiologic insomnia: Secondary | ICD-10-CM

## 2016-06-01 DIAGNOSIS — R7303 Prediabetes: Secondary | ICD-10-CM

## 2016-06-01 DIAGNOSIS — E0590 Thyrotoxicosis, unspecified without thyrotoxic crisis or storm: Secondary | ICD-10-CM | POA: Diagnosis not present

## 2016-06-01 DIAGNOSIS — M810 Age-related osteoporosis without current pathological fracture: Secondary | ICD-10-CM

## 2016-06-01 DIAGNOSIS — Z0000 Encounter for general adult medical examination without abnormal findings: Secondary | ICD-10-CM | POA: Diagnosis not present

## 2016-06-01 DIAGNOSIS — N951 Menopausal and female climacteric states: Secondary | ICD-10-CM

## 2016-06-01 DIAGNOSIS — Z23 Encounter for immunization: Secondary | ICD-10-CM

## 2016-06-01 MED ORDER — PNEUMOCOCCAL VAC POLYVALENT 25 MCG/0.5ML IJ INJ: 0.5000 mL | INJECTION | INTRAMUSCULAR | Status: DC

## 2016-06-01 NOTE — Progress Notes (Signed)
I have personally reviewed the Medicare Annual Wellness questionnaire and have noted 1.The patient's medical and social history 2.Their use of alcohol, tobacco or illicit drugs 3.Their current medications and supplements 4.The patient's functional ability including ADL's, fall risks, home safety risks and hearing or visual  impairment. 5.Diet and physical activities 6.Evidence for depression or mood disorders 7. Updated provider list Cognitive evaluation was performed and recorded on pt medicare questionnaire form. The patients weight, height, BMI and visual acuity have been recorded in the chart  I have made referrals, counseling and provided education to the patient based review of the above and I have provided the pt with a written personalized care plan for preventive services.   She request hormone check given continue hot flashes. Occ excessive sweating and hot flashes. Not new. nml TSH, nml catecholamines, cbc nml For same issue 11/2014 May be SE to the metoprolol.  GERD: Had episode of flare after large greasy meal.  took protonix for a week, now gone.  Hypertension. Well controlled at home Occ spikes: If she takes metoprolol twice daily BP is better controlled. Has history of fibromuscular dysplasia But not renal fibromuscular dysplasia.  BP Readings from Last 3 Encounters:  06/01/16 110/80  12/17/15 162/82  09/03/15 119/65  Using medication without problems or lightheadedness: None Chest pain with exertion:None Edema: Chronic swelling in right foot.. Achilles injury Short of breath:None Average home BPs: See above Other issues:   Vit D nml range  Elevated Cholesterol: LDL almost at goal < 130 on no medication. She has been working some on diet changes since.   Lab Results  Component Value Date   CHOL 212* 05/25/2016   HDL 52.00 05/25/2016   LDLCALC 135* 05/25/2016   LDLDIRECT 140.3  03/26/2013   TRIG 125.0 05/25/2016   CHOLHDL 4 05/25/2016  Diet compliance: Has tried to avoid fried foods. Low carb.  Using vinegar and lemon juice.  Exercise: daily x 5 days a week walking Other complaints:   Prediabetes: stable.  Lab Results  Component Value Date   HGBA1C 5.9 05/25/2016   Body mass index is 32.91 kg/(m^2). Wt Readings from Last 3 Encounters:  06/01/16 180 lb (81.647 kg)  12/17/15 180 lb (81.647 kg)  09/03/15 177 lb (80.287 kg)    Thyroid stable at last check. Lab Results  Component Value Date   TSH 2.44 05/25/2016   Hot flashes interfering with sleep at night.  Melatonin 5 mg does not help. No trouble falling asleep, just staying asleep. Sleep 3-4 hours a day.   Fibromuscular dysplsia followed by vascular. [place] of carotids and renal.. Every 2 years..  Social History /Family History/Past Medical History reviewed and updated if needed.   Review of Systems  Constitutional: Negative for fever, fatigue and unexpected weight change.  HENT: Negative for ear pain, congestion, sore throat, sneezing, trouble swallowing and sinus pressure.  Eyes: Negative for pain and itching.  Respiratory: negative for shortness of breath. Negative for cough and wheezing.  Cardiovascular: Negative for chest pain, palpitations and leg swelling.  Gastrointestinal: Negative for nausea, abdominal pain, diarrhea, constipation and blood in stool.  Genitourinary: Negative for dysuria, hematuria, vaginal discharge, difficulty urinating and menstrual problem.  Skin: Negative for rash.  Neurological: Negative for syncope, weakness, light-headedness, numbness and headaches.  Psychiatric/Behavioral: Negative for confusion and dysphoric mood. The patient is not nervous/anxious.  Objective:   Physical Exam  Constitutional: Vital signs are normal. She appears well-developed and well-nourished. She is cooperative. Non-toxic appearance. She does not appear  ill. No distress.   HENT:  Head: Normocephalic.  Right Ear: Hearing, tympanic membrane, external ear and ear canal normal.  Left Ear: Hearing, tympanic membrane, external ear and ear canal normal.  Nose: Nose normal.  Eyes: Conjunctivae, EOM and lids are normal. Pupils are equal, round, and reactive to light. No foreign bodies found.  Neck: Trachea normal and normal range of motion. Neck supple. Carotid bruit is not present. No mass and no thyromegaly present.  Cardiovascular: Normal rate, regular rhythm, S1 normal, S2 normal, normal heart sounds and intact distal pulses. Exam reveals no gallop.  No murmur heard.  Pulmonary/Chest: Effort normal and breath sounds normal. No respiratory distress. She has no wheezes. She has no rhonchi. She has no rales.  Abdominal: Soft. Normal appearance and bowel sounds are normal. She exhibits no distension, no fluid wave, no abdominal bruit and no mass. There is no hepatosplenomegaly. There is no tenderness. There is no rebound, no guarding and no CVA tenderness. No hernia.  Genitourinary: No breast swelling, tenderness, discharge or bleeding.  Lymphadenopathy:  She has no cervical adenopathy.  She has no axillary adenopathy.  Neurological: She is alert. She has normal strength. No cranial nerve deficit or sensory deficit.  Skin: Skin is warm, dry and intact. No rash noted.  Psychiatric: Her speech is normal and behavior is normal. Judgment normal. Her mood appears not anxious. Cognition and memory are normal. She does not exhibit a depressed mood.  Assessment & Plan:   The patient's preventative maintenance and recommended screening tests for an annual wellness exam were reviewed in full today.  Brought up to date unless services declined.  Counselled on the importance of diet, exercise, and its role in overall health and mortality.  The patient's FH and SH was reviewed, including their home life, tobacco status, and drug and alcohol status.   Vaccines: Due  for Tdap, considering shingles. Will get PCV23 today. Colon:10/2013, Dr. Fuller Plan, Repeat in 5 years. DVE/pap: total hysterectomy  Mammo: 04/26/2016 nml DEXA:01/2014 osteoporosis, she refuses bisphosphonate, she is taking vit D.  NOW due for 2 year repeat. She is now sexually active. Not interested in STD testing.

## 2016-06-01 NOTE — Addendum Note (Signed)
Addended by: Inocencio Homes on: 06/01/2016 04:54 PM   Modules accepted: Orders, SmartSet

## 2016-06-01 NOTE — Assessment & Plan Note (Signed)
Work on low cholesterol, diet, exercise and healthy

## 2016-06-01 NOTE — Assessment & Plan Note (Signed)
Improved control with lifestyle.

## 2016-06-01 NOTE — Assessment & Plan Note (Signed)
Due to hot flashes. Discussed trazodone, she will consider.

## 2016-06-01 NOTE — Assessment & Plan Note (Addendum)
She requests hormone evaluation. May be SE to meds. She can try black cohosh.

## 2016-06-01 NOTE — Patient Instructions (Addendum)
Consider getting TDap, shingles at pharmacy.  Stop at front desk to set up referral to DEXA.

## 2016-06-01 NOTE — Assessment & Plan Note (Signed)
Well controlled. Continue current medication.  

## 2016-06-01 NOTE — Assessment & Plan Note (Addendum)
Nml control, no medication.

## 2016-06-01 NOTE — Assessment & Plan Note (Signed)
Due for re-eval. 

## 2016-06-02 LAB — ESTRADIOL: Estradiol: 16 pg/mL

## 2016-06-02 LAB — FOLLICLE STIMULATING HORMONE: FSH: 36.8 m[IU]/mL

## 2016-06-02 LAB — LUTEINIZING HORMONE: LH: 13.22 m[IU]/mL

## 2016-07-04 DIAGNOSIS — H11823 Conjunctivochalasis, bilateral: Secondary | ICD-10-CM | POA: Diagnosis not present

## 2016-07-04 DIAGNOSIS — H2513 Age-related nuclear cataract, bilateral: Secondary | ICD-10-CM | POA: Diagnosis not present

## 2016-07-05 ENCOUNTER — Other Ambulatory Visit: Payer: Self-pay | Admitting: Family Medicine

## 2016-07-06 ENCOUNTER — Ambulatory Visit
Admission: RE | Admit: 2016-07-06 | Discharge: 2016-07-06 | Disposition: A | Discharge status: Home / Self Care | Payer: Medicare Other | Source: Ambulatory Visit | Attending: Family Medicine | Admitting: Family Medicine

## 2016-07-06 DIAGNOSIS — M810 Age-related osteoporosis without current pathological fracture: Secondary | ICD-10-CM

## 2016-07-06 DIAGNOSIS — M8589 Other specified disorders of bone density and structure, multiple sites: Secondary | ICD-10-CM | POA: Diagnosis not present

## 2016-07-06 DIAGNOSIS — Z780 Asymptomatic menopausal state: Secondary | ICD-10-CM | POA: Diagnosis not present

## 2016-07-12 ENCOUNTER — Other Ambulatory Visit: Payer: Self-pay | Admitting: Orthopedic Surgery

## 2016-07-12 DIAGNOSIS — M79604 Pain in right leg: Secondary | ICD-10-CM

## 2016-07-12 DIAGNOSIS — M25561 Pain in right knee: Secondary | ICD-10-CM | POA: Diagnosis not present

## 2016-07-12 DIAGNOSIS — M7989 Other specified soft tissue disorders: Principal | ICD-10-CM

## 2016-07-17 ENCOUNTER — Ambulatory Visit
Admission: RE | Admit: 2016-07-17 | Discharge: 2016-07-17 | Disposition: A | Discharge status: Home / Self Care | Payer: Medicare Other | Source: Ambulatory Visit | Attending: Orthopedic Surgery | Admitting: Orthopedic Surgery

## 2016-07-17 DIAGNOSIS — M7989 Other specified soft tissue disorders: Secondary | ICD-10-CM | POA: Diagnosis not present

## 2016-07-17 DIAGNOSIS — M79604 Pain in right leg: Secondary | ICD-10-CM | POA: Insufficient documentation

## 2016-07-18 DIAGNOSIS — M25571 Pain in right ankle and joints of right foot: Secondary | ICD-10-CM | POA: Diagnosis not present

## 2016-08-03 ENCOUNTER — Ambulatory Visit (INDEPENDENT_AMBULATORY_CARE_PROVIDER_SITE_OTHER): Payer: Medicare Other

## 2016-08-03 DIAGNOSIS — Z23 Encounter for immunization: Secondary | ICD-10-CM

## 2016-08-09 ENCOUNTER — Encounter (INDEPENDENT_AMBULATORY_CARE_PROVIDER_SITE_OTHER): Payer: Self-pay

## 2016-08-11 ENCOUNTER — Telehealth: Payer: Self-pay

## 2016-08-11 NOTE — Telephone Encounter (Signed)
Pt left v/m; pt seen 06/01/16 and hormone replacement was discussed; pt having problems sleeping and having night sweats; pt request low dose of hormone replacement [facility]. Pt request cb.

## 2016-08-15 ENCOUNTER — Encounter: Payer: Self-pay | Admitting: Family Medicine

## 2016-08-15 ENCOUNTER — Ambulatory Visit (INDEPENDENT_AMBULATORY_CARE_PROVIDER_SITE_OTHER): Payer: Medicare Other | Admitting: Family Medicine

## 2016-08-15 VITALS — BP 148/80 | HR 64 | Temp 98.5°F | Ht 62.0 in | Wt 180.0 lb

## 2016-08-15 DIAGNOSIS — R232 Flushing: Secondary | ICD-10-CM

## 2016-08-15 DIAGNOSIS — R61 Generalized hyperhidrosis: Secondary | ICD-10-CM

## 2016-08-15 DIAGNOSIS — I7789 Other specified disorders of arteries and arterioles: Secondary | ICD-10-CM

## 2016-08-15 DIAGNOSIS — R519 Headache, unspecified: Secondary | ICD-10-CM

## 2016-08-15 DIAGNOSIS — N951 Menopausal and female climacteric states: Secondary | ICD-10-CM | POA: Diagnosis not present

## 2016-08-15 DIAGNOSIS — R51 Headache: Secondary | ICD-10-CM | POA: Diagnosis not present

## 2016-08-15 DIAGNOSIS — R29898 Other symptoms and signs involving the musculoskeletal system: Secondary | ICD-10-CM

## 2016-08-15 DIAGNOSIS — G4485 Primary stabbing headache: Secondary | ICD-10-CM | POA: Insufficient documentation

## 2016-08-15 NOTE — Assessment & Plan Note (Signed)
Doubt menopausal symptoms at her age. Recommend against HRT for unclear issue.  Labs nml.  ?etiology. Pt very worried about this and bothered.. Cannot sleep due to sweats... I willr esearch other needed eval/referral.

## 2016-08-15 NOTE — Assessment & Plan Note (Signed)
Unclear if related to other issues.. Has follow up eval with  Vascular upcoming.

## 2016-08-15 NOTE — Assessment & Plan Note (Addendum)
Given localized pain associated with unusual neuro symptoms ( limb heaviness and brain fog) will eval with MRI and consider referral to neuro.

## 2016-08-15 NOTE — Addendum Note (Signed)
Addended by: Carter Kitten on: 08/15/2016 03:46 PM   Modules accepted: Orders

## 2016-08-15 NOTE — Progress Notes (Signed)
Pre visit review using our clinic review tool, if applicable. No additional management support is needed unless otherwise documented below in the visit note. 

## 2016-08-15 NOTE — Telephone Encounter (Signed)
Pt needs to make appt to discuss given risk of heart disease associated with HRT.

## 2016-08-15 NOTE — Telephone Encounter (Signed)
Kristy [name] is scheduled today at 2:30 pm with Dr. Diona Browner.

## 2016-08-15 NOTE — Progress Notes (Signed)
Subjective:    Patient ID: Kristy [name], female    DOB: [birth year], 75 y.o.   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 She is interested in HRT therapy.  Chronic intermittent hot flashes:  She has tried black cohosh for hot flashes. She has trouble with not sleeping due to sweating. Felt in past likely secondary to metoprolol. Lab eval was negative. nm CMET, nml thyroid, nml cbc nml catecholamines She was on HRT for 5 year premarin when in 61s.   Nml renal duplex in 2014 for BP spikes. BP lately has been well controlled at home 134/70. BP Readings from Last 3 Encounters:  08/15/16 (!) 148/80  08/09/16 (!) 145/74  06/01/16 110/80    She also is having sharp pains in her head, off and on in last year.  Sharp stabbing pain 10/10. Last seconds. Always on  Right side of head. This most recent time.. Had upset stomach, BP increased to 190/80. Left arm felt funny, left face felt heavy.  Lasted for most the day  Felt in brain fog. No weakness.  Neice was there this most recent time.. No slurred speech, no droopy face. Now occurring every few months. Not increasing in frequency.  Nml eye exam 06/2016  She denies stress, not increase in anxiety but tends to be more iiritable lately. Some trouble relaxing.  Has appt with vascular next month.   Review of Systems  Constitutional: Negative for fatigue and fever.  HENT: Negative for ear pain.   Eyes: Negative for pain.  Respiratory: Negative for chest tightness and shortness of breath.   Cardiovascular: Negative for chest pain, palpitations and leg swelling.  Gastrointestinal: Negative for abdominal pain.  Genitourinary: Negative for dysuria.       Objective:   Physical Exam  Constitutional: She is oriented to person, place, and time. Vital signs are normal. She appears well-developed and  well-nourished. She is cooperative.  Non-toxic appearance. She does not appear ill. No distress.  HENT:  Head: Normocephalic.  Right Ear: Hearing, tympanic membrane, external ear and ear canal normal. Tympanic membrane is not erythematous, not retracted and not bulging.  Left Ear: Hearing, tympanic membrane, external ear and ear canal normal. Tympanic membrane is not erythematous, not retracted and not bulging.  Nose: No mucosal edema or rhinorrhea. Right sinus exhibits no maxillary sinus tenderness and no frontal sinus tenderness. Left sinus exhibits no maxillary sinus tenderness and no frontal sinus tenderness.  Mouth/Throat: Uvula is midline, oropharynx is clear and moist and mucous membranes are normal.  Eyes: Conjunctivae, EOM and lids are normal. Pupils are equal, round, and reactive to light. Lids are everted and swept, no foreign bodies found.  Neck: Trachea normal and normal range of motion. Neck supple. Carotid bruit is not present. No thyroid mass and no thyromegaly present.  Cardiovascular: Normal rate, regular rhythm, S1 normal, S2 normal, normal heart sounds, intact distal pulses and normal pulses.  Exam reveals no gallop and no friction rub.   No murmur heard. Pulmonary/Chest: Effort normal and breath sounds normal. No tachypnea. No respiratory distress. She has no decreased breath sounds. She has no wheezes. She has no rhonchi. She has no rales.  Abdominal: Soft. Normal appearance and bowel sounds are normal. There is no tenderness.  Neurological: She is alert and oriented to person, place, and time. She has normal strength and normal reflexes. No  cranial nerve deficit or sensory deficit. She exhibits normal muscle tone. She displays a negative Romberg sign. Coordination and gait normal. GCS eye subscore is 4. GCS verbal subscore is 5. GCS motor subscore is 6.  Nml cerebellar exam   No papilledema  Skin: Skin is warm, dry and intact. No rash noted.  Psychiatric: She has a normal  mood and affect. Her speech is normal and behavior is normal. Judgment and thought content normal. Her mood appears not anxious. Cognition and memory are normal. Cognition and memory are not impaired. She does not exhibit a depressed mood. She exhibits normal recent memory and normal remote memory.          Assessment & Plan:

## 2016-08-15 NOTE — Patient Instructions (Addendum)
Stop at front desk to set up MRI of brain.   We may consider referral to neurologist at later date if MRI nml.

## 2016-08-16 LAB — BASIC METABOLIC PANEL
BUN: 14 mg/dL (ref 6–23)
CO2: 28 mEq/L (ref 19–32)
Calcium: 9.6 mg/dL (ref 8.4–10.5)
Chloride: 106 mEq/L (ref 96–112)
Creatinine, Ser: 0.71 mg/dL (ref 0.40–1.20)
GFR: 103.71 mL/min (ref >60.00)
Glucose, Bld: 77 mg/dL (ref 70–99)
Potassium: 3.9 mEq/L (ref 3.5–5.1)
Sodium: 142 mEq/L (ref 135–145)

## 2016-08-22 ENCOUNTER — Telehealth: Payer: Self-pay | Admitting: Family Medicine

## 2016-08-22 DIAGNOSIS — R61 Generalized hyperhidrosis: Secondary | ICD-10-CM

## 2016-08-22 DIAGNOSIS — R51 Headache: Principal | ICD-10-CM

## 2016-08-22 DIAGNOSIS — E785 Hyperlipidemia, unspecified: Secondary | ICD-10-CM

## 2016-08-22 DIAGNOSIS — R519 Headache, unspecified: Secondary | ICD-10-CM

## 2016-08-22 NOTE — Telephone Encounter (Signed)
Ms. Parker notified as instructed by telephone.  She would like to get the labs and CXR.  Appointment scheduled 08/24/2016 at 9:45 am with lab.  MRI is scheduled for Friday 08/25/2016.  Appointment scheduled with Dr. Diona Browner 08/29/16 at 3:30 to discuss these results.  Will forward message to Dr. Diona Browner to place orders.

## 2016-08-22 NOTE — Telephone Encounter (Signed)
Notify pt that after looking into night sweats and possible causes... The list of possible causes is broad.. Including from meds like aspirin, BBlockers (she is on  ASA and metoprolol) and acid OTC meds like prilosec ( not sure if she uses) can cause as a side effect.. I do not recommend her stopping these meds though given the benefits.  If she would like to be aggressive in looking into causes we can proceed with lab testing for HIV, hep C,and a sed rate to look into something called temporal arteritis as well as a CXR.  We are still awaiting the MRI brain to eval her headaches and I recommend she  make a 30 min follow up to discuss the above after it returns.

## 2016-08-23 ENCOUNTER — Other Ambulatory Visit: Payer: Medicare Other

## 2016-08-24 ENCOUNTER — Ambulatory Visit (INDEPENDENT_AMBULATORY_CARE_PROVIDER_SITE_OTHER)
Admission: RE | Admit: 2016-08-24 | Discharge: 2016-08-24 | Disposition: A | Discharge status: Home / Self Care | Payer: Medicare Other | Source: Ambulatory Visit | Attending: Family Medicine | Admitting: Family Medicine

## 2016-08-24 ENCOUNTER — Other Ambulatory Visit (INDEPENDENT_AMBULATORY_CARE_PROVIDER_SITE_OTHER): Payer: Medicare Other

## 2016-08-24 DIAGNOSIS — R918 Other nonspecific abnormal finding of lung field: Secondary | ICD-10-CM | POA: Diagnosis not present

## 2016-08-24 DIAGNOSIS — R61 Generalized hyperhidrosis: Secondary | ICD-10-CM

## 2016-08-24 DIAGNOSIS — R51 Headache: Secondary | ICD-10-CM

## 2016-08-24 DIAGNOSIS — R519 Headache, unspecified: Secondary | ICD-10-CM

## 2016-08-24 LAB — SEDIMENTATION RATE: Sed Rate: 16 mm/hr (ref 0–30)

## 2016-08-24 NOTE — Addendum Note (Signed)
Addended by: Eliezer Lofts E on: 08/24/2016 08:36 AM   Modules accepted: Orders

## 2016-08-25 ENCOUNTER — Ambulatory Visit
Admission: RE | Admit: 2016-08-25 | Discharge: 2016-08-25 | Disposition: A | Discharge status: Home / Self Care | Payer: Medicare Other | Source: Ambulatory Visit | Attending: Family Medicine | Admitting: Family Medicine

## 2016-08-25 DIAGNOSIS — R61 Generalized hyperhidrosis: Secondary | ICD-10-CM

## 2016-08-25 DIAGNOSIS — R519 Headache, unspecified: Secondary | ICD-10-CM

## 2016-08-25 DIAGNOSIS — R51 Headache: Secondary | ICD-10-CM | POA: Diagnosis not present

## 2016-08-25 DIAGNOSIS — R29898 Other symptoms and signs involving the musculoskeletal system: Secondary | ICD-10-CM

## 2016-08-25 DIAGNOSIS — I7789 Other specified disorders of arteries and arterioles: Secondary | ICD-10-CM

## 2016-08-25 LAB — HIV ANTIBODY (ROUTINE TESTING W REFLEX): HIV 1&2 Ab, 4th Generation: NONREACTIVE

## 2016-08-25 LAB — HEPATITIS C ANTIBODY: HCV Ab: NEGATIVE

## 2016-08-25 MED ORDER — GADOBENATE DIMEGLUMINE 529 MG/ML IV SOLN
20.0000 mL | Freq: Once | INTRAVENOUS | Status: AC | PRN
  Administered 2016-08-25: 16 mL via INTRAVENOUS

## 2016-08-29 ENCOUNTER — Ambulatory Visit (INDEPENDENT_AMBULATORY_CARE_PROVIDER_SITE_OTHER): Payer: Medicare Other | Admitting: Family Medicine

## 2016-08-29 ENCOUNTER — Encounter: Payer: Self-pay | Admitting: Family Medicine

## 2016-08-29 DIAGNOSIS — R232 Flushing: Secondary | ICD-10-CM

## 2016-08-29 DIAGNOSIS — G4485 Primary stabbing headache: Secondary | ICD-10-CM

## 2016-08-29 MED ORDER — CICLOPIROX 8 % EX SOLN: Freq: Every day | CUTANEOUS | 11 refills | Status: DC

## 2016-08-29 NOTE — Patient Instructions (Addendum)
Schedule yearly physical n 05/2017 with labs prior.

## 2016-08-29 NOTE — Assessment & Plan Note (Signed)
Neg work up, infrequent.  Pt can try melatonin if increasing in frequency.

## 2016-08-29 NOTE — Progress Notes (Signed)
Pre visit review using our clinic review tool, if applicable. No additional management support is needed unless otherwise documented below in the visit note. 

## 2016-08-29 NOTE — Assessment & Plan Note (Signed)
Neg extensive eval. Reviewed in detail with pt.  Likely secondary to anxiety versus med SE from asa or BBlocker.

## 2016-08-29 NOTE — Progress Notes (Signed)
   Subjective:    Patient ID: Kristy [name], female    DOB: 1941/09/03, 75 y.o.   MRN: [id number]  HPI  75 year old female presents for follow up  Sharp severe headaches as well as night sweats.  BP is very well controlled on current regimen. BP Readings from Last 3 Encounters:  08/29/16 126/75  08/15/16 (!) 148/80  08/09/16 (!) 145/74   She continues to have one sharp pain in last month.. Not debilitating.  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 Otherwise normal appearance the brain for a person of this age. No specific explanation for the described symptoms.  For night sweats: Lab eval (sed rate, HIV, hep c) and CXR were negative. The list of possible causes is broad.. Including from meds like aspirin, BBlockers (she is on  ASA and metoprolol) and acid OTC meds like prilosec ( does not use) can cause as a side effect.     Review of Systems  Constitutional: Negative for fatigue.  HENT: Negative for ear pain.   Eyes: Negative for pain.  Respiratory: Negative for cough and shortness of breath.   Cardiovascular: Negative for chest pain.       Objective:   Physical Exam  Constitutional: Vital signs are normal. She appears well-developed and well-nourished. She is cooperative.  Non-toxic appearance. She does not appear ill. No distress.  HENT:  Head: Normocephalic.  Right Ear: Hearing, tympanic membrane, external ear and ear canal normal. Tympanic membrane is not erythematous, not retracted and not bulging.  Left Ear: Hearing, tympanic membrane, external ear and ear canal normal. Tympanic membrane is not erythematous, not retracted and not bulging.  Nose: No mucosal edema or rhinorrhea. Right sinus exhibits no maxillary sinus tenderness and no frontal sinus tenderness. Left sinus exhibits no maxillary sinus tenderness and no frontal sinus tenderness.  Mouth/Throat: Uvula is midline, oropharynx is clear and  moist and mucous membranes are normal.  Eyes: Conjunctivae, EOM and lids are normal. Pupils are equal, round, and reactive to light. Lids are everted and swept, no foreign bodies found.  Neck: Trachea normal and normal range of motion. Neck supple. Carotid bruit is not present. No thyroid mass and no thyromegaly present.  Cardiovascular: Normal rate, regular rhythm, S1 normal, S2 normal, normal heart sounds, intact distal pulses and normal pulses.  Exam reveals no gallop and no friction rub.   No murmur heard. Pulmonary/Chest: Effort normal and breath sounds normal. No tachypnea. No respiratory distress. She has no decreased breath sounds. She has no wheezes. She has no rhonchi. She has no rales.  Abdominal: Soft. Normal appearance and bowel sounds are normal. There is no tenderness.  Neurological: She is alert.  Skin: Skin is warm, dry and intact. No rash noted.  Psychiatric: Her speech is normal and behavior is normal. Judgment and thought content normal. Her mood appears not anxious. Cognition and memory are normal. She does not exhibit a depressed mood.          Assessment & Plan:

## 2016-09-08 ENCOUNTER — Encounter (INDEPENDENT_AMBULATORY_CARE_PROVIDER_SITE_OTHER): Payer: Self-pay

## 2016-09-11 ENCOUNTER — Encounter (INDEPENDENT_AMBULATORY_CARE_PROVIDER_SITE_OTHER): Payer: Self-pay

## 2016-09-11 ENCOUNTER — Encounter (INDEPENDENT_AMBULATORY_CARE_PROVIDER_SITE_OTHER): Payer: Medicare Other

## 2016-09-11 ENCOUNTER — Ambulatory Visit (INDEPENDENT_AMBULATORY_CARE_PROVIDER_SITE_OTHER): Payer: Self-pay | Admitting: Vascular Surgery

## 2016-11-21 ENCOUNTER — Ambulatory Visit (INDEPENDENT_AMBULATORY_CARE_PROVIDER_SITE_OTHER): Payer: Medicare Other | Admitting: Internal Medicine

## 2016-11-21 ENCOUNTER — Encounter: Payer: Self-pay | Admitting: Internal Medicine

## 2016-11-21 VITALS — BP 130/82 | HR 78 | Temp 98.2°F | Wt 192.0 lb

## 2016-11-21 DIAGNOSIS — L0231 Cutaneous abscess of buttock: Secondary | ICD-10-CM | POA: Diagnosis not present

## 2016-11-21 DIAGNOSIS — R8290 Unspecified abnormal findings in urine: Secondary | ICD-10-CM | POA: Diagnosis not present

## 2016-11-21 DIAGNOSIS — R3129 Other microscopic hematuria: Secondary | ICD-10-CM | POA: Diagnosis not present

## 2016-11-21 LAB — POC URINALSYSI DIPSTICK (AUTOMATED)
Bilirubin, UA: NEGATIVE
Glucose, UA: NEGATIVE
Ketones, UA: NEGATIVE
Leukocytes, UA: NEGATIVE
Nitrite, UA: NEGATIVE
Protein, UA: NEGATIVE
Spec Grav, UA: >=1.03
Urobilinogen, UA: NEGATIVE
pH, UA: 5.5

## 2016-11-21 MED ORDER — AMOXICILLIN-POT CLAVULANATE 875-125 MG PO TABS: 1.0000 | ORAL_TABLET | Freq: Two times a day (BID) | ORAL | 0 refills | Status: DC

## 2016-11-21 NOTE — Progress Notes (Signed)
Subjective:    Patient ID: Kristy [name], female    DOB: Jul 20, 1941, 76 y.o.   MRN: [id number]  HPI  Pt presents to the clinic today with c/o right flank pain and strong odor to her urine. This started 1 week ago. She describes the pain as aching. The pain does not radiate. She denies urinary urgency, frequency or dysuria. She has tried Tylenol OTC with minimal relief. She has no history of kidney stones. She has a history of microscopic hematuria, but has had this evaluated by multiple specialist in the past.  She also c/o a small bump on her buttocks. She first noticed this 2 days ago. It has gotten larger in size. It itches but it is not tender. She has not noticed any drainage in the area. She has tried black salve on the area with minimal relief.  Review of Systems  Past Medical History:  Diagnosis Date  . Allergy   . 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 Hulda Humphrey of vascular surgery in [place]  . Arthritis   . Fibromuscular dysplasia ([place])   . GERD (gastroesophageal reflux disease)   . Hyperlipidemia   . Hypertension    For 16 years; urinary catecholamines within normal limits 12/09; renal Doppler ultrasound showed no evidence for renal artery stenosis  . Normal echocardiogram 4/10   LVEF 65%; no regional wall motion abnormalities; normal RV size and function; pulmonic valve had increased gradient across w/ peak gradient of about 36 mmHg (range of moderate pulmonic stenosis) followup showed normal valve  . Tinnitus of left ear     Current Outpatient Prescriptions  Medication Sig Dispense Refill  . aspirin 81 MG EC tablet Take 81 mg by mouth every other day.     . chlorthalidone (HYGROTON) 25 MG tablet TAKE 1 TABLET (25 MG TOTAL) BY MOUTH DAILY. 90 tablet 0  . Cholecalciferol (VITAMIN D3) 2000 UNITS TABS Take 2 tablets by mouth daily.    Marland Kitchen CRANBERRY EXTRACT PO Take by mouth as needed.     . Lactobacillus  (PROBIOTIC ACIDOPHILUS PO) Take 1 tablet by mouth daily.    . metoprolol succinate (TOPROL-XL) 100 MG 24 hr tablet TAKE 1 TABLET (100 MG TOTAL) BY MOUTH DAILY. TAKE WITH OR IMMEDIATELY FOLLOWING A MEAL. 90 tablet 2  . Multiple Vitamin (MULTIVITAMIN) tablet Take 2 tablets by mouth daily.     . Multiple Vitamins-Minerals (HAIR/SKIN/NAILS) TABS Take 2 tablets by mouth daily.    . diclofenac sodium (VOLTAREN) 1 % GEL Apply 1 g topically QID.  3   No current facility-administered medications for this visit.     No Known Allergies  Family History  Problem Relation Age of Onset  . Prostate cancer Father   . Coronary artery disease Paternal Grandmother   . Goiter Other     ?  . Colon cancer Maternal Uncle   . Colon cancer Paternal Uncle   . Esophageal cancer Neg Hx   . Stomach cancer Neg Hx   . Rectal cancer Neg Hx     Social History   Social History  . Marital status: Widowed    Spouse name: N/A  . Number of children: 0  . Years of education: N/A   Occupational History  . 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 Smoking status: Former Smoker    Quit date: 11/20/1982  . Smokeless tobacco: Never Used  Comment: 15 pack year history  . Alcohol use Yes     Comment: Wine every 2 weeks  . Drug use: No  . Sexual activity: Not on file   Other Topics Concern  . Not on file   Social History Narrative   Widowed in 2007-spouse died from colon cancer.   Regular exercise at [facility].   Diet consists of fruits and veggies, snacks a lot.         Constitutional: Denies fever, malaise, fatigue, headache or abrupt weight changes.  Gastrointestinal: Denies abdominal pain, bloating, constipation, diarrhea or blood in the stool.  GU: Pt reports flank pain. Denies urgency, frequency, pain with urination, burning sensation, blood in urine, odor or discharge. Skin: Pt reports bump on buttocks. Denies redness, rashes.    No other specific complaints in a complete  review of systems (except as listed in HPI above).     Objective:   Physical Exam  BP 130/82   Pulse 78   Temp 98.2 F (36.8 C) (Oral)   Wt 192 lb (87.1 kg)   SpO2 98%   BMI 35.12 kg/m  Wt Readings from Last 3 Encounters:  11/21/16 192 lb (87.1 kg)  08/29/16 179 lb 12 oz (81.5 kg)  08/15/16 180 lb (81.6 kg)    General: Appears her stated age, well developed, well nourished in NAD. Skin: 1 cm group of blisters with 1.5 cm of induration noted of left gluteal fold.  Abdomen: Soft and nontender. No CVA tenderness noted.   BMET    Component Value Date/Time   NA 142 08/15/2016 1552   K 3.9 08/15/2016 1552   CL 106 08/15/2016 1552   CO2 28 08/15/2016 1552   GLUCOSE 77 08/15/2016 1552   BUN 14 08/15/2016 1552   CREATININE 0.71 08/15/2016 1552   CALCIUM 9.6 08/15/2016 1552   GFRNONAA 107.25 09/12/2010 0911   GFRAA 108 09/22/2008 0920    Lipid Panel     Component Value Date/Time   CHOL 212 (H) 05/25/2016 0944   TRIG 125.0 05/25/2016 0944   HDL 52.00 05/25/2016 0944   CHOLHDL 4 05/25/2016 0944   VLDL 25.0 05/25/2016 0944   LDLCALC 135 (H) 05/25/2016 0944    CBC    Component Value Date/Time   WBC 7.0 12/08/2014 1621   RBC 4.40 12/08/2014 1621   HGB 14.3 12/08/2014 1621   HCT 41.7 12/08/2014 1621   PLT 301.0 12/08/2014 1621   MCV 94.9 12/08/2014 1621   MCHC 34.2 12/08/2014 1621   RDW 13.0 12/08/2014 1621   LYMPHSABS 3.4 12/08/2014 1621   MONOABS 0.5 12/08/2014 1621   EOSABS 0.2 12/08/2014 1621   BASOSABS 0.0 12/08/2014 1621    Hgb A1C Lab Results  Component Value Date   HGBA1C 5.9 05/25/2016            Assessment & Plan:   Urine odor, right flank pain:  Urinalysis: 3+ blood Will send urine culture eRx for Augmentin BID x 10 days   Abscess of buttocks:  Could also be shingles, but she reports she has not had exposure, also never had chicken pox eRx for Augmenton BID x 10 days Return precautions discussed  RTC as needed or if symptoms  persist or worsen Theotis Gerdeman, NP

## 2016-11-21 NOTE — Patient Instructions (Signed)
Flank Pain Flank pain refers to pain that is located on the side of the body between the upper abdomen and the back. The pain may occur over a short period of time (acute) or may be long-term or reoccurring (chronic). It may be mild or severe. Flank pain can be caused by many things. CAUSES  Some of the more common causes of flank pain include:  Muscle strains.   Muscle spasms.   A disease of your spine (vertebral disk disease).   A lung infection (pneumonia).   Fluid around your lungs (pulmonary edema).   A kidney infection.   Kidney stones.   A very painful skin rash caused by the chickenpox virus (shingles).   Gallbladder disease.  HOME CARE INSTRUCTIONS  Home care will depend on the cause of your pain. In general,  Rest as directed by your caregiver.  Drink enough fluids to keep your urine clear or pale yellow.  Only take over-the-counter or prescription medicines as directed by your caregiver. Some medicines may help relieve the pain.  Tell your caregiver about any changes in your pain.  Follow up with your caregiver as directed. SEEK IMMEDIATE MEDICAL CARE IF:   Your pain is not controlled with medicine.   You have new or worsening symptoms.  Your pain increases.   You have abdominal pain.   You have shortness of breath.   You have persistent nausea or vomiting.   You have swelling in your abdomen.   You feel faint or pass out.   You have blood in your urine.  You have a fever or persistent symptoms for more than 2-3 days.  You have a fever and your symptoms suddenly get worse. MAKE SURE YOU:   Understand these instructions.  Will watch your condition.  Will get help right away if you are not doing well or get worse. This information is not intended to replace advice given to you by your health care provider. Make sure you discuss any questions you have with your health care provider. Document Released: 12/28/2005 Document  Revised: 07/31/2012 Document Reviewed: 08/10/2015 Elsevier Interactive Patient Education  2017 [employer]  

## 2016-11-23 LAB — URINE CULTURE: Colony Count: >=100000

## 2016-11-24 ENCOUNTER — Telehealth: Payer: Self-pay

## 2016-11-24 ENCOUNTER — Telehealth: Payer: Self-pay | Admitting: Family Medicine

## 2016-11-24 NOTE — Telephone Encounter (Signed)
Pt was seen on 11/21/16 for a "UT" she was prescribed augmentin and she said that it gave her diarrhea. She would like to know if she should continue taking it?

## 2016-11-24 NOTE — Telephone Encounter (Signed)
error 

## 2016-11-24 NOTE — Telephone Encounter (Signed)
I would have her try to continue taking it. That is a known side effect. Have her try taking it with food.

## 2016-11-24 NOTE — Telephone Encounter (Signed)
Pt called requesting info from the am  Please call cell [phone] Thanks

## 2016-11-24 NOTE — Telephone Encounter (Signed)
Pt is aware as instructed and states that she believes she can finish Ax and will continue but making sure she drinks plenty of fluids and take with food

## 2017-01-11 DIAGNOSIS — L668 Other cicatricial alopecia: Secondary | ICD-10-CM | POA: Diagnosis not present

## 2017-01-11 DIAGNOSIS — D2239 Melanocytic nevi of other parts of face: Secondary | ICD-10-CM | POA: Diagnosis not present

## 2017-01-26 ENCOUNTER — Other Ambulatory Visit (INDEPENDENT_AMBULATORY_CARE_PROVIDER_SITE_OTHER): Payer: Self-pay | Admitting: Vascular Surgery

## 2017-01-26 DIAGNOSIS — G459 Transient cerebral ischemic attack, unspecified: Secondary | ICD-10-CM

## 2017-01-26 DIAGNOSIS — I6529 Occlusion and stenosis of unspecified carotid artery: Secondary | ICD-10-CM

## 2017-01-26 DIAGNOSIS — I773 Arterial fibromuscular dysplasia: Secondary | ICD-10-CM

## 2017-01-29 ENCOUNTER — Ambulatory Visit (INDEPENDENT_AMBULATORY_CARE_PROVIDER_SITE_OTHER): Payer: Medicare Other

## 2017-01-29 ENCOUNTER — Ambulatory Visit (INDEPENDENT_AMBULATORY_CARE_PROVIDER_SITE_OTHER): Payer: Medicare Other | Admitting: Vascular Surgery

## 2017-01-29 ENCOUNTER — Encounter (INDEPENDENT_AMBULATORY_CARE_PROVIDER_SITE_OTHER): Payer: Self-pay

## 2017-01-29 ENCOUNTER — Encounter (INDEPENDENT_AMBULATORY_CARE_PROVIDER_SITE_OTHER): Payer: Self-pay | Admitting: Vascular Surgery

## 2017-01-29 VITALS — BP 181/98 | HR 68 | Resp 16 | Ht 62.0 in | Wt 183.0 lb

## 2017-01-29 DIAGNOSIS — E785 Hyperlipidemia, unspecified: Secondary | ICD-10-CM

## 2017-01-29 DIAGNOSIS — I6529 Occlusion and stenosis of unspecified carotid artery: Secondary | ICD-10-CM | POA: Diagnosis not present

## 2017-01-29 DIAGNOSIS — G459 Transient cerebral ischemic attack, unspecified: Secondary | ICD-10-CM | POA: Diagnosis not present

## 2017-01-29 DIAGNOSIS — I773 Arterial fibromuscular dysplasia: Secondary | ICD-10-CM

## 2017-01-29 DIAGNOSIS — I10 Essential (primary) hypertension: Secondary | ICD-10-CM | POA: Diagnosis not present

## 2017-01-29 DIAGNOSIS — I701 Atherosclerosis of renal artery: Secondary | ICD-10-CM

## 2017-01-29 NOTE — Progress Notes (Signed)
MRN : [id number]  Kristy [name] is a 76 y.o. ([birth year]) female who presents with chief complaint of  Chief Complaint  Patient presents with  . Re-evaluation    Ultrasound follow up  .  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 There have been no interval changes in the patient's blood pressure control.  He denies any major changes in is medications.  The patient denies headache or flushing.  No flank or unusual back pain.    There have been no significant changes to the patient's overall health care.  No interval shortening of the patient's walking distance or new symptoms consistent with claudication.  The patient denies the  development of rest pain symptoms. No new ulcers or wounds have occurred since the last visit.  The patient denies amaurosis fugax or recent TIA symptoms. There are no recent neurological changes noted. The patient denies history of DVT, PE or superficial thrombophlebitis. The patient denies recent episodes of angina or shortness of breath.   Duplex ultrasound of the renal arteries shows 30-50% of the right renal artery  Duplex ultrasound of the carotid arteries <30% stenosis bilaterally   Current Meds  Medication Sig  . aspirin 81 MG EC tablet Take 81 mg by mouth every other day.   . chlorthalidone (HYGROTON) 25 MG tablet TAKE 1 TABLET (25 MG TOTAL) BY MOUTH DAILY.  Marland Kitchen Cholecalciferol (VITAMIN D3) 2000 UNITS TABS Take 2 tablets by mouth daily.  . diclofenac sodium (VOLTAREN) 1 % GEL Apply 1 g topically QID.  Marland Kitchen Lactobacillus (PROBIOTIC ACIDOPHILUS PO) Take 1 tablet by mouth daily.  . metoprolol succinate (TOPROL-XL) 100 MG 24 hr tablet TAKE 1 TABLET (100 MG TOTAL) BY MOUTH DAILY. TAKE WITH OR IMMEDIATELY FOLLOWING A MEAL.  . Multiple Vitamin (MULTIVITAMIN) tablet Take 2 tablets by mouth daily.     Past Medical  History:  Diagnosis Date  . Allergy   . 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 Hulda Humphrey of vascular surgery in [place]  . Arthritis   . Fibromuscular dysplasia ([place])   . GERD (gastroesophageal reflux disease)   . Hyperlipidemia   . Hypertension    For 16 years; urinary catecholamines within normal limits 12/09; renal Doppler ultrasound showed no evidence for renal artery stenosis  . Normal echocardiogram 4/10   LVEF 65%; no regional wall motion abnormalities; normal RV size and function; pulmonic valve had increased gradient across w/ peak gradient of about 36 mmHg (range of moderate pulmonic stenosis) followup showed normal valve  . Tinnitus of left ear     Past Surgical History:  Procedure Laterality Date  . ANGIOPLASTY     2012  . Cardiolyte  11/07   Neg  . KNEE CARTILAGE SURGERY     right knee  . TOTAL ABDOMINAL HYSTERECTOMY     no cervix    Social History Social History  Substance Use Topics  . Smoking status: Former Smoker    Quit date: 11/20/1982  . Smokeless tobacco: Never Used     Comment: 15 pack year history  . Alcohol use Yes     Comment: Wine every 2 weeks    Family History Family History  Problem Relation Age of Onset  . Prostate cancer Father   . Colon cancer Maternal Uncle   . Colon cancer Paternal Uncle   . Coronary  artery disease Paternal Grandmother   . Goiter Other     ?  . Esophageal cancer Neg Hx   . Stomach cancer Neg Hx   . Rectal cancer Neg Hx   No family history of bleeding/clotting disorders, porphyria or autoimmune disease   No Known Allergies   REVIEW OF SYSTEMS (Negative unless checked)  Constitutional: [] Weight loss  [] Fever  [] Chills Cardiac: [] Chest pain   [] Chest pressure   [] Palpitations   [] Shortness of breath when laying flat   [] Shortness of breath with exertion. Vascular:  [] Pain in legs with walking   [] Pain in legs at rest  [] History of DVT   [] Phlebitis    [] Swelling in legs   [] Varicose veins   [] Non-healing ulcers Pulmonary:   [] Uses home oxygen   [] Productive cough   [] Hemoptysis   [] Wheeze  [] COPD   [] Asthma Neurologic:  [] Dizziness   [] Seizures   [] History of stroke   [] History of TIA  [] Aphasia   [] Vissual changes   [] Weakness or numbness in arm   [] Weakness or numbness in leg Musculoskeletal:   [] Joint swelling   [] Joint pain   [] Low back pain Hematologic:  [] Easy bruising  [] Easy bleeding   [] Hypercoagulable state   [] Anemic Gastrointestinal:  [] Diarrhea   [] Vomiting  [] Gastroesophageal reflux/heartburn   [] Difficulty swallowing. Genitourinary:  [] Chronic kidney disease   [] Difficult urination  [] Frequent urination   [] Blood in urine Skin:  [] Rashes   [] Ulcers  Psychological:  [] History of anxiety   []  History of major depression.  Physical Examination  Vitals:   01/29/17 1009 01/29/17 1010  BP: (!) 222/104 (!) 181/98  Pulse: 68   Resp: 16   Weight: 183 lb (83 kg)   Height: 5\' 2"  (1.575 m)    Body mass index is 33.47 kg/m. Gen: WD/WN, NAD Head: [place]/AT, No temporalis wasting.  Ear/Nose/Throat: Hearing grossly intact, nares w/o erythema or drainage, poor dentition Eyes: PER, EOMI, sclera nonicteric.  Neck: Supple, no masses.  No bruit or JVD.  Pulmonary:  Good air movement, clear to auscultation bilaterally, no use of accessory muscles.  Cardiac: RRR, normal S1, S2, no Murmurs. Vascular:  Vessel Right Left  Radial Palpable Palpable  Ulnar Palpable Palpable  Brachial Palpable Palpable  Carotid Palpable Palpable  Femoral Palpable Palpable  Popliteal Palpable Palpable  PT Palpable Palpable  DP Palpable Palpable   Gastrointestinal: soft, non-distended. No guarding/no peritoneal signs.  Musculoskeletal: M/S 5/5 throughout.  No deformity or atrophy.  Neurologic: CN 2-12 intact. Pain and light touch intact in extremities.  Symmetrical.  Speech is fluent. Motor exam as listed above. Psychiatric: Judgment intact, Mood & affect  appropriate for pt's clinical situation. Dermatologic: No rashes or ulcers noted.  No changes consistent with cellulitis. Lymph : No Cervical lymphadenopathy, no lichenification or skin changes of chronic lymphedema.  CBC Lab Results  Component Value Date   WBC 7.0 12/08/2014   HGB 14.3 12/08/2014   HCT 41.7 12/08/2014   MCV 94.9 12/08/2014   PLT 301.0 12/08/2014    BMET    Component Value Date/Time   NA 142 08/15/2016 1552   K 3.9 08/15/2016 1552   CL 106 08/15/2016 1552   CO2 28 08/15/2016 1552   GLUCOSE 77 08/15/2016 1552   BUN 14 08/15/2016 1552   CREATININE 0.71 08/15/2016 1552   CALCIUM 9.6 08/15/2016 1552   GFRNONAA 107.25 09/12/2010 0911   GFRAA 108 09/22/2008 0920   CrCl cannot be calculated (Patient's most recent lab result is older than the  maximum 21 days allowed.).  COAG No results found for: INR, PROTIME  Radiology No results found.   Assessment/Plan 1. HYPERTENSION, BENIGN ESSENTIAL, LABILE Given patient's arterial disease optimal control of the patient's hypertension is important. BP is acceptable today  The patient's vital signs and noninvasive studies support the renal artery stenosis is not significantly increased when compared to the previous study.  No invasive studies or intervention is indicated at this time.  The patient will continue the current antihypertensive medications, no changes at this time.  The primary medical service will continue aggressive antihypertensive therapy as per the AHA guidelines   2. Fibromuscular dysplasia ([place]) See #1  3. Renal artery stenosis ([place]) See #1 - VAS US RENAL ARTERY DUPLEX; Future  4. Hyperlipidemia, unspecified hyperlipidemia type Continue statin as ordered and reviewed, no changes at this time     Hortencia Pilar, MD  01/29/2017 10:52 AM

## 2017-02-15 ENCOUNTER — Telehealth: Payer: Medicare Other | Admitting: Family

## 2017-02-15 DIAGNOSIS — N390 Urinary tract infection, site not specified: Secondary | ICD-10-CM

## 2017-02-15 MED ORDER — CEPHALEXIN 500 MG PO CAPS: 500.0000 mg | ORAL_CAPSULE | Freq: Two times a day (BID) | ORAL | 0 refills | Status: DC

## 2017-02-15 NOTE — Progress Notes (Signed)

## 2017-02-23 ENCOUNTER — Other Ambulatory Visit: Payer: Self-pay

## 2017-02-23 MED ORDER — CHLORTHALIDONE 25 MG PO TABS: ORAL_TABLET | ORAL | 0 refills | Status: DC

## 2017-02-23 NOTE — Telephone Encounter (Signed)
Pt left v/m requesting chlorthaldone refill to [facility]. Last refilled # 90 on 10/23/14. Last f/u appt 08/29/16. Pt has not future appts.Please advise.

## 2017-02-23 NOTE — Telephone Encounter (Signed)
Okay to refill...she is using the chlorthalidone ( a diuretic)  as needed for swelling or BP spikes.  Refill sent in.

## 2017-03-02 ENCOUNTER — Emergency Department (HOSPITAL_COMMUNITY)
Admission: EM | Admit: 2017-03-02 | Discharge: 2017-03-02 | Disposition: A | Discharge status: Home / Self Care | Payer: Medicare Other | Attending: Emergency Medicine | Admitting: Emergency Medicine

## 2017-03-02 ENCOUNTER — Encounter (HOSPITAL_COMMUNITY): Payer: Self-pay | Admitting: Emergency Medicine

## 2017-03-02 DIAGNOSIS — Z7982 Long term (current) use of aspirin: Secondary | ICD-10-CM | POA: Insufficient documentation

## 2017-03-02 DIAGNOSIS — R112 Nausea with vomiting, unspecified: Secondary | ICD-10-CM

## 2017-03-02 DIAGNOSIS — I10 Essential (primary) hypertension: Secondary | ICD-10-CM

## 2017-03-02 DIAGNOSIS — Z79899 Other long term (current) drug therapy: Secondary | ICD-10-CM | POA: Insufficient documentation

## 2017-03-02 DIAGNOSIS — Z87891 Personal history of nicotine dependence: Secondary | ICD-10-CM | POA: Insufficient documentation

## 2017-03-02 LAB — CBC
HCT: 42.2 % (ref 36.0–46.0)
Hemoglobin: 14.4 g/dL (ref 12.0–15.0)
MCH: 32.4 pg (ref 26.0–34.0)
MCHC: 34.1 g/dL (ref 30.0–36.0)
MCV: 95 fL (ref 78.0–100.0)
Platelets: 325 10*3/uL (ref 150–400)
RBC: 4.44 MIL/uL (ref 3.87–5.11)
RDW: 13 % (ref 11.5–15.5)
WBC: 4.9 10*3/uL (ref 4.0–10.5)

## 2017-03-02 LAB — COMPREHENSIVE METABOLIC PANEL
ALT: 15 U/L (ref 14–54)
AST: 19 U/L (ref 15–41)
Albumin: 4.2 g/dL (ref 3.5–5.0)
Alkaline Phosphatase: 71 U/L (ref 38–126)
Anion gap: 8 (ref 5–15)
BUN: 13 mg/dL (ref 6–20)
CO2: 25 mmol/L (ref 22–32)
Calcium: 9.7 mg/dL (ref 8.9–10.3)
Chloride: 106 mmol/L (ref 101–111)
Creatinine, Ser: 0.78 mg/dL (ref 0.44–1.00)
GFR calc Af Amer: >60 mL/min (ref >60)
GFR calc non Af Amer: >60 mL/min (ref >60)
Glucose, Bld: 124 mg/dL — ABNORMAL HIGH (ref 65–99)
Potassium: 3.7 mmol/L (ref 3.5–5.1)
Sodium: 139 mmol/L (ref 135–145)
Total Bilirubin: 0.7 mg/dL (ref 0.3–1.2)
Total Protein: 7.9 g/dL (ref 6.5–8.1)

## 2017-03-02 LAB — URINALYSIS, ROUTINE W REFLEX MICROSCOPIC
Bacteria, UA: NONE SEEN
Bilirubin Urine: NEGATIVE
Glucose, UA: NEGATIVE mg/dL
Ketones, ur: NEGATIVE mg/dL
Leukocytes, UA: NEGATIVE
Nitrite: NEGATIVE
Protein, ur: NEGATIVE mg/dL
Specific Gravity, Urine: 1.004 — ABNORMAL LOW (ref 1.005–1.030)
pH: 5 (ref 5.0–8.0)

## 2017-03-02 MED ORDER — DICYCLOMINE HCL 20 MG PO TABS: 20.0000 mg | ORAL_TABLET | Freq: Three times a day (TID) | ORAL | 0 refills | Status: DC

## 2017-03-02 NOTE — ED Provider Notes (Signed)
[facility] DEPT Provider Note   CSN: [id number] Arrival date & time: 03/02/17  1146     History   Chief Complaint Chief Complaint  Patient presents with  . Hypertension  . Weakness    HPI Kristy [name] is a 76 y.o. female with PMHx of HTN, GERD, hyperlipidemia, renal artery stenosis, fibromuscular dysplasia hyperthyroidism, anxiety presents with complaints of hypertension at 0730 this morning. She reports associated nausea, upset stomach, vomiting, diarrhea, and right facial weakness. She has tried nothing for her symptoms. She denies visual changes, blurry vision, double vision, photophobia, changes in gait. She reports taking her daily medications as prescribed. She states this has happened in the past and has had an MRI done which came back negative. She denies any use of blood thinners, she does admit to taking aspirin daily. She states she has an appointment with a hypertensive specialist next week, referred by her vascular surgeon. She also states she regularly sees her PCP.    The history is provided by the patient. No language interpreter was used.  Hypertension  Pertinent negatives include no chest pain, no abdominal pain and no shortness of breath.  Weakness  Pertinent negatives include no shortness of breath and no chest pain.    Past Medical History:  Diagnosis Date  . Allergy   . 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 Hulda Humphrey of vascular surgery in [place]  . Arthritis   . Fibromuscular dysplasia ([place])   . GERD (gastroesophageal reflux disease)   . Hyperlipidemia   . Hypertension    For 16 years; urinary catecholamines within normal limits 12/09; renal Doppler ultrasound showed no evidence for renal artery stenosis  . Normal echocardiogram 4/10   LVEF 65%; no regional wall motion abnormalities; normal RV size and function; pulmonic valve had increased gradient across w/ peak gradient of about 36  mmHg (range of moderate pulmonic stenosis) followup showed normal valve  . Tinnitus of left ear     Patient Active Problem List   Diagnosis Date Noted  . Renal artery stenosis ([place]) 01/29/2017  . 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 Chronic insomnia 06/01/2016  . BPPV (benign paroxysmal positional vertigo) 12/17/2015  . Counseling regarding end of life decision making 05/20/2015  . Hot flashes 12/08/2014  . 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 Allergic rhinitis 10/04/2012  . Osteoporosis 09/14/2010  . Prediabetes 09/14/2010  . ONYCHOMYCOSIS 08/12/2009  . HYPERTENSION, BENIGN ESSENTIAL, LABILE 02/15/2009  . LOW BACK PAIN, CHRONIC 02/01/2009  . Fibromuscular dysplasia ([place]) 08/27/2008  . Hyperthyroidism 08/28/2007  . Hyperlipidemia 08/28/2007  . 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 GERD 08/28/2007  . IRRITABLE BOWEL SYNDROME 08/28/2007    Past Surgical History:  Procedure Laterality Date  . ANGIOPLASTY     2012  . Cardiolyte  11/07   Neg  . KNEE CARTILAGE SURGERY     right knee  . TOTAL ABDOMINAL HYSTERECTOMY     no cervix    OB History    No data available       Home Medications    Prior to Admission medications   Medication Sig Start Date End Date Taking? Authorizing Provider  aspirin EC 81 MG tablet Take 81 mg by mouth 2 (two) times a week.   Yes Historical Provider, MD  chlorthalidone (HYGROTON) 25 MG tablet TAKE 1 TABLET (25 MG TOTAL) BY MOUTH DAILY. 02/23/17  Yes Amy Cletis [place], MD  cholecalciferol (  VITAMIN D) 1000 units tablet Take 2,000 Units by mouth daily.   Yes Historical Provider, MD  lactobacillus acidophilus (BACID) TABS tablet Take 1 tablet by mouth daily.   Yes Historical Provider, MD  metoprolol succinate (TOPROL-XL) 100 MG 24 hr tablet TAKE 1 TABLET (100 MG TOTAL) BY MOUTH DAILY. TAKE WITH OR IMMEDIATELY FOLLOWING A MEAL. 07/05/16  Yes Amy Cletis [place], MD  cephALEXin (KEFLEX) 500 MG capsule Take  1 capsule (500 mg total) by mouth 2 (two) times daily. Patient not taking: Reported on 03/02/2017 02/15/17   Benjamine Mola, FNP  dicyclomine (BENTYL) 20 MG tablet Take 1 tablet (20 mg total) by mouth 4 (four) times daily -  before meals and at bedtime. 03/02/17 03/12/17  Bettey Costa, [place]    Family History Family History  Problem Relation Age of Onset  . Prostate cancer Father   . Colon cancer Maternal Uncle   . Colon cancer Paternal Uncle   . Coronary artery disease Paternal Grandmother   . Goiter Other     ?  . Esophageal cancer Neg Hx   . Stomach cancer Neg Hx   . Rectal cancer Neg Hx     Social History Social History  Substance Use Topics  . Smoking status: Former Smoker    Quit date: 11/20/1982  . Smokeless tobacco: Never Used     Comment: 15 pack year history  . Alcohol use Yes     Comment: Wine every 2 weeks     Allergies   Patient has no known allergies.   Review of Systems Review of Systems  Constitutional: Negative for chills and fever.  Eyes: Negative for photophobia and visual disturbance.  Respiratory: Negative for shortness of breath.   Cardiovascular: Negative for chest pain.  Gastrointestinal: Positive for nausea. Negative for abdominal pain.  Genitourinary: Negative for difficulty urinating and dysuria.  Neurological: Positive for weakness (right facial). Negative for speech difficulty.  All other systems reviewed and are negative.    Physical Exam Updated Vital Signs BP 139/74 (BP Location: Left Arm)   Pulse 64   Temp 97.8 F (36.6 C) (Oral)   Resp 15   Ht 5\' 2"  (1.575 m)   Wt 81.2 kg   SpO2 99%   BMI 32.74 kg/m   Physical Exam  Constitutional: She is oriented to person, place, and time. She appears well-developed and well-nourished.  Well appearing  HENT:  Head: Normocephalic and atraumatic.  Nose: Nose normal.  Mouth/Throat: Oropharynx is clear and moist.  Eyes: Conjunctivae and EOM are normal. Pupils are equal, round, and  reactive to light.  Neck: Normal range of motion. No JVD present. No tracheal deviation present.  Cardiovascular: Normal rate, normal heart sounds and intact distal pulses.   No murmur heard. 2+ distal pulses  Pulmonary/Chest: Effort normal and breath sounds normal. No stridor. No respiratory distress. She has no wheezes. She has no rales.  Normal work of breathing. No respiratory distress noted.   Abdominal: Soft. Bowel sounds are normal. There is no tenderness. There is no rebound and no guarding.  Musculoskeletal: Normal range of motion.  Neurological: She is alert and oriented to person, place, and time.  Cranial Nerves:  III,IV, VI: ptosis not present, extra-ocular movements intact bilaterally, direct and consensual pupillary light reflexes intact bilaterally V: facial sensation, jaw opening, and bite strength equal bilaterally VII: eyebrow raise, eyelid close, smile, frown, pucker equal bilaterally VIII: hearing grossly normal bilaterally  IX,X: palate elevation and swallowing intact XI: bilateral  shoulder shrug and lateral head rotation equal and strong XII: midline tongue extension  Negative pronator drift, negative Romberg, negative RAM's, negative heel-to-shin, negative finger to nose.    Sensory intact.  Muscle strength 5/5 Patient able to ambulate without difficulty.   Skin: Skin is warm.  Psychiatric: She has a normal mood and affect. Her behavior is normal.  Nursing note and vitals reviewed.    ED Treatments / Results  Labs (all labs ordered are listed, but only abnormal results are displayed) Labs Reviewed  COMPREHENSIVE METABOLIC PANEL - Abnormal; Notable for the following:       Result Value   Glucose, Bld 124 (*)    All other components within normal limits  URINALYSIS, ROUTINE W REFLEX MICROSCOPIC - Abnormal; Notable for the following:    Color, Urine STRAW (*)    Specific Gravity, Urine 1.004 (*)    Hgb urine dipstick MODERATE (*)    Squamous Epithelial /  LPF 0-5 (*)    All other components within normal limits  CBC    EKG  EKG Interpretation None       Radiology No results found.  Procedures Procedures (including critical care time)  Medications Ordered in ED Medications - No data to display   Initial Impression / Assessment and Plan / ED Course  I have reviewed the triage vital signs and the nursing notes.  Pertinent labs & imaging results that were available during my care of the patient were reviewed by me and considered in my medical decision making (see chart for details).    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 The patient here with history of anxiety as well. Pt does not have any symptoms of nausea, vomiting or any other symptoms here in ED.  Patient does not have any symptoms of visual changes, changes in gait. Patient noted to be hypertensive in the emergency department.  No signs of hypertensive urgency or emergency. Patient's blood pressure improved here in ED without any treatment. Pt will be given prescription for bentyl to help with possible IBS symptoms and to continue it until seeing her PCP. Discussed with patient the need for close follow-up and management by their primary care physician. Reasons to immediately return to ED discussed. Pt also seen and evaluated by Dr. Ellender Hose who agrees with assessment and plan.   Vitals:   03/02/17 1156 03/02/17 1157 03/02/17 1321  BP: (!) 198/80  139/74  Pulse: 87  64  Resp: 16  15  Temp: 97.8 F (36.6 C)    TempSrc: Oral    SpO2: 98%  99%  Weight:  81.2 kg   Height:  5\' 2"  (1.575 m)      Final Clinical Impressions(s) / ED Diagnoses   Final diagnoses:  Hypertension, unspecified type  Non-intractable vomiting with nausea, unspecified vomiting type    New Prescriptions New Prescriptions   DICYCLOMINE (BENTYL) 20 MG TABLET    Take 1 tablet (20 mg total) by mouth 4 (four) times daily -  before meals and at bedtime.       [place], [place] 03/02/17 1437    Duffy Bruce, MD 03/03/17 1143

## 2017-03-02 NOTE — ED Triage Notes (Addendum)
Pt verbalizes awoke with hypertension 0730 this morning; associated symptoms of nausea, upset stomach, and right facial weakness. Pt grips/strength equal throughout, denies less pin prick or numbness to face or extremities. Pt smile symmetrical.

## 2017-03-02 NOTE — ED Notes (Signed)
PT DISCHARGED. INSTRUCTIONS AND PRESCRIPTION GIVEN. AAOX4. PT IN NO APPARENT DISTRESS OR PAIN. THE OPPORTUNITY TO ASK QUESTIONS WAS PROVIDED. 

## 2017-03-02 NOTE — Discharge Instructions (Signed)
Please schedule an appointment with your primary care provider today regarding today's visit. Please take Bentyl 4 times a day, Once for each meal and then one time before bedtime. Please take at least a glass of water throughout the day.  Get help right away if: You develop a severe headache or confusion. You have unusual weakness or numbness. You feel faint. You have severe pain in your chest or abdomen. You vomit repeatedly. You have trouble breathing. Get help right away if: You have severe and worsening abdominal pain. You have diarrhea and: You have a rash, stiff neck, or severe headache. You are irritable, sleepy, or difficult to awaken. You are weak, dizzy, or extremely thirsty. You have bright red blood in your stool or you have black tarry stools. You have unusual abdominal swelling that is painful. You vomit continuously. You vomit blood (hematemesis). You have both abdominal pain and a fever.

## 2017-03-07 DIAGNOSIS — I701 Atherosclerosis of renal artery: Secondary | ICD-10-CM | POA: Diagnosis not present

## 2017-03-07 DIAGNOSIS — R3129 Other microscopic hematuria: Secondary | ICD-10-CM | POA: Diagnosis not present

## 2017-03-07 DIAGNOSIS — I10 Essential (primary) hypertension: Secondary | ICD-10-CM | POA: Diagnosis not present

## 2017-04-09 DIAGNOSIS — I10 Essential (primary) hypertension: Secondary | ICD-10-CM | POA: Diagnosis not present

## 2017-04-09 DIAGNOSIS — R3129 Other microscopic hematuria: Secondary | ICD-10-CM | POA: Diagnosis not present

## 2017-04-09 DIAGNOSIS — I701 Atherosclerosis of renal artery: Secondary | ICD-10-CM | POA: Diagnosis not present

## 2017-04-18 ENCOUNTER — Ambulatory Visit: Payer: Self-pay | Admitting: Urology

## 2017-05-09 ENCOUNTER — Encounter: Payer: Self-pay | Admitting: Urology

## 2017-05-14 ENCOUNTER — Ambulatory Visit: Payer: Self-pay | Admitting: Urology

## 2017-06-08 ENCOUNTER — Other Ambulatory Visit: Payer: Self-pay | Admitting: Family Medicine

## 2017-06-08 DIAGNOSIS — Z1231 Encounter for screening mammogram for malignant neoplasm of breast: Secondary | ICD-10-CM

## 2017-06-12 ENCOUNTER — Ambulatory Visit: Payer: Medicare Other

## 2017-06-15 ENCOUNTER — Ambulatory Visit
Admission: RE | Admit: 2017-06-15 | Discharge: 2017-06-15 | Disposition: A | Discharge status: Home / Self Care | Payer: Medicare Other | Source: Ambulatory Visit | Attending: Family Medicine | Admitting: Family Medicine

## 2017-06-15 ENCOUNTER — Other Ambulatory Visit: Payer: Self-pay | Admitting: Family Medicine

## 2017-06-15 DIAGNOSIS — Z1231 Encounter for screening mammogram for malignant neoplasm of breast: Secondary | ICD-10-CM

## 2017-06-19 ENCOUNTER — Other Ambulatory Visit: Payer: Self-pay | Admitting: Family Medicine

## 2017-06-19 DIAGNOSIS — R928 Other abnormal and inconclusive findings on diagnostic imaging of breast: Secondary | ICD-10-CM

## 2017-06-19 NOTE — Telephone Encounter (Signed)
Last office visit 11/21/2016 with R. Baity.  Not on current medication list.   Refill?

## 2017-06-26 ENCOUNTER — Ambulatory Visit
Admission: RE | Admit: 2017-06-26 | Discharge: 2017-06-26 | Disposition: A | Discharge status: Home / Self Care | Payer: Medicare Other | Source: Ambulatory Visit | Attending: Family Medicine | Admitting: Family Medicine

## 2017-06-26 ENCOUNTER — Other Ambulatory Visit: Payer: Self-pay | Admitting: Family Medicine

## 2017-06-26 DIAGNOSIS — N6310 Unspecified lump in the right breast, unspecified quadrant: Secondary | ICD-10-CM

## 2017-06-26 DIAGNOSIS — R928 Other abnormal and inconclusive findings on diagnostic imaging of breast: Secondary | ICD-10-CM | POA: Diagnosis not present

## 2017-06-26 DIAGNOSIS — N6489 Other specified disorders of breast: Secondary | ICD-10-CM | POA: Diagnosis not present

## 2017-06-29 ENCOUNTER — Other Ambulatory Visit (INDEPENDENT_AMBULATORY_CARE_PROVIDER_SITE_OTHER): Payer: Self-pay | Admitting: Vascular Surgery

## 2017-06-29 ENCOUNTER — Ambulatory Visit
Admission: RE | Admit: 2017-06-29 | Discharge: 2017-06-29 | Disposition: A | Discharge status: Home / Self Care | Payer: Medicare Other | Source: Ambulatory Visit | Attending: Family Medicine | Admitting: Family Medicine

## 2017-06-29 ENCOUNTER — Ambulatory Visit
Admission: RE | Admit: 2017-06-29 | Discharge: 2017-06-29 | Disposition: A | Discharge status: Home / Self Care | Payer: Medicare Other | Source: Ambulatory Visit | Attending: Vascular Surgery | Admitting: Vascular Surgery

## 2017-06-29 DIAGNOSIS — N6310 Unspecified lump in the right breast, unspecified quadrant: Secondary | ICD-10-CM

## 2017-06-29 DIAGNOSIS — D241 Benign neoplasm of right breast: Secondary | ICD-10-CM | POA: Diagnosis not present

## 2017-06-29 DIAGNOSIS — I701 Atherosclerosis of renal artery: Secondary | ICD-10-CM

## 2017-06-29 DIAGNOSIS — N6312 Unspecified lump in the right breast, upper inner quadrant: Secondary | ICD-10-CM | POA: Diagnosis not present

## 2017-06-29 DIAGNOSIS — N6314 Unspecified lump in the right breast, lower inner quadrant: Secondary | ICD-10-CM | POA: Diagnosis not present

## 2017-07-05 ENCOUNTER — Telehealth: Payer: Self-pay | Admitting: Family Medicine

## 2017-07-05 DIAGNOSIS — M810 Age-related osteoporosis without current pathological fracture: Secondary | ICD-10-CM

## 2017-07-05 DIAGNOSIS — R7303 Prediabetes: Secondary | ICD-10-CM

## 2017-07-05 DIAGNOSIS — E785 Hyperlipidemia, unspecified: Secondary | ICD-10-CM

## 2017-07-05 DIAGNOSIS — E0590 Thyrotoxicosis, unspecified without thyrotoxic crisis or storm: Secondary | ICD-10-CM

## 2017-07-05 NOTE — Telephone Encounter (Signed)
-----   Message from Eustace Pen, LPN sent at [phone]/32/3557  5:51 PM EDT ----- Regarding: Labs 8/17 Please place lab orders.   [facility] Medicare

## 2017-07-06 ENCOUNTER — Other Ambulatory Visit: Payer: Medicare Other

## 2017-07-06 ENCOUNTER — Ambulatory Visit: Payer: Medicare Other

## 2017-07-09 ENCOUNTER — Ambulatory Visit: Payer: Self-pay | Admitting: Surgery

## 2017-07-09 ENCOUNTER — Other Ambulatory Visit: Payer: Self-pay | Admitting: Surgery

## 2017-07-09 DIAGNOSIS — D241 Benign neoplasm of right breast: Secondary | ICD-10-CM

## 2017-07-09 NOTE — H&P (Signed)
Kristy [name] 07/09/2017 2:05 PM Location: [facility] Patient #: [id number] DOB: 06/06/1941 Widowed / Language: Kristy [name] / Race: Black or African American Female  History of Present Illness [name] A. Dannae Kato MD; 07/09/2017 3:45 PM) Patient words: Patient's at the request of Dr. Teressa Senter for mammographic abnormality on screening mammogram. The patient had an area of microcalcifications and distortion in the right breast. 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 She is sent to me at the request of Dr. Darcel Smalling for this. The area measures 4 mm in maximal diameter. Patient denies any previous history of breast problems or history of breast cancer.               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 - SEE MICROSCOPIC DESCRIPTION. Microscopic Comment Called to [facility] on 07/02/2017. (JDP:ecj 07/02/2017) Claudette Laws MD Pathologist, Electronic Signature (Case signed 07/02/2017) Specimen Gross and Clinical Information Specimen Comment In formalin 3:15 pm extracted < 2 min; mass Specimen(s) Obtained: Breast, right, needle core biopsy, 3 o'clock Specimen Clinical Information FA; papilloma; [place] Gross Received labeled "Kristy [name],Kristy [name]" and "rt breast 300 4cm" (TIF 1515 CIT <76min) are 4 cores of gray white to dark red soft tissue, ranging from 0.2 x 0.1 x 0.1 cm to 0.4 x 0.1 x 0.1 cm. One block submitted. (SSW 8/10) Report signed out from the following location(s) Technical Component was performed at [facility]. [address],[place] [address].[id number]., Interpretation was performed at [facility], [place], [place] [address]. CLIA #: [id number], 1 of 2       CLINICAL DATA: Patient returns after screening study for evaluation of possible right breast mass. EXAM: 2D DIGITAL DIAGNOSTIC RIGHT MAMMOGRAM WITH  CAD AND ADJUNCT TOMO ULTRASOUND RIGHT BREAST COMPARISON: 06/15/2017 and earlier ACR Breast Density Category b: There are scattered areas of fibroglandular density. FINDINGS: Additional 2-D and 3-D images are performed. These views demonstrate a persistent oval mass in the lower inner quadrant of the right breast measuring 5 mm in diameter. Mammographic images were processed with CAD. On physical exam, I palpate no abnormality in the medial aspect of the right breast. Targeted ultrasound is performed, showing a circumscribed hypoechoic parallel oval mass in the 3 o'clock location of the right breast 4 cm from nipple. Mass is associated increased through transmission no internal vascularity. Mass measures 0.4 x 0.5 x 0.3 cm. Evaluation of the axilla is negative for adenopathy. IMPRESSION: Persistent circumscribed oval mass in the medial aspect of the right breast warranting tissue diagnosis. 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 She will continue her aspirin regimen for the biopsy. RECOMMENDATION: Ultrasound-guided core biopsy is recommended and scheduled for the patient. I have discussed the findings and recommendations with the patient. Results were also provided in writing at the conclusion of the visit. If applicable, a reminder letter will be sent to the patient regarding the next appointment. BI-RADS CATEGORY 4: Suspicious. Electronically Signed By: Nolon Nations M.D. On: 06/26/2017 15:08 .  The patient is a 76 year old female.   Past Surgical History (Tanisha A. Owens Shark, [place]; 07/09/2017 2:05 PM) Colon Polyp Removal - Colonoscopy Hysterectomy (not due to cancer) - Partial Knee Surgery Right.  Diagnostic Studies History (Tanisha A. Owens Shark, [place]; 07/09/2017 2:05 PM) Colonoscopy 5-10 years ago Mammogram within last year  Allergies (Tanisha A. Owens Shark, [place]; 07/09/2017 2:06 PM) No  Known Drug Allergies 07/09/2017 Allergies Reconciled  Medication History (Tanisha A. Owens Shark, [place]; 07/09/2017 2:07 PM) AmLODIPine Besylate (5MG  Tablet, Oral) Active. Metoprolol Succinate ER (100MG  Tablet ER 24HR, Oral) Active. Pantoprazole Sodium (40MG  Tablet DR, Oral) Active. Aspirin (81MG  Tablet, Oral) Active. Medications Reconciled  Social History (Tanisha A. Owens Shark, [place]; 07/09/2017 2:05 PM) Alcohol use Occasional alcohol use. No caffeine use No drug use Tobacco use Former smoker.  Family History (Tanisha A. Owens Shark, [place]; 07/09/2017 2:05 PM) Prostate Cancer Father.  Pregnancy / Birth History (Tanisha A. Owens Shark, [place]; 07/09/2017 2:05 PM) Age at menarche 82 years. Gravida 0 Para 0  Other Problems (Tanisha A. Owens Shark, [place]; 07/09/2017 2:05 PM) Arthritis Gastroesophageal Reflux Disease High blood pressure Vascular Disease     Review of Systems (Tanisha A. Brown RMA; 07/09/2017 2:05 PM) General Not Present- Appetite Loss, Chills, Fatigue, Fever, Night Sweats, Weight Gain and Weight Loss. Skin Not Present- Change in Wart/Mole, Dryness, Hives, Jaundice, New Lesions, Non-Healing Wounds, Rash and Ulcer. HEENT Present- Ringing in the Ears, Seasonal Allergies and Wears glasses/contact lenses. Not Present- Earache, Hearing Loss, Hoarseness, Nose Bleed, Oral Ulcers, Sinus Pain, Sore Throat, Visual Disturbances and Yellow Eyes. Respiratory Not Present- Bloody sputum, Chronic Cough, Difficulty Breathing, Snoring and Wheezing. Breast Present- Breast Mass. Not Present- Breast Pain, Nipple Discharge and Skin Changes. Cardiovascular Present- Swelling of Extremities. Not Present- Chest Pain, Difficulty Breathing Lying Down, Leg Cramps, Palpitations, Rapid Heart Rate and Shortness of Breath. Gastrointestinal Not Present- Abdominal Pain, Bloating, Bloody Stool, Change in Bowel Habits, Chronic diarrhea, Constipation, Difficulty Swallowing, Excessive gas, Gets full quickly at meals,  Hemorrhoids, Indigestion, Nausea, Rectal Pain and Vomiting. Female Genitourinary Not Present- Frequency, Nocturia, Painful Urination, Pelvic Pain and Urgency. Neurological Not Present- Decreased Memory, Fainting, Headaches, Numbness, Seizures, Tingling, Tremor, Trouble walking and Weakness. Psychiatric Not Present- Anxiety, Bipolar, Change in Sleep Pattern, Depression, Fearful and Frequent crying. Endocrine Not Present- Cold Intolerance, Excessive Hunger, Hair Changes, Heat Intolerance, Hot flashes and New Diabetes. Hematology Not Present- Blood Thinners, Easy Bruising, Excessive bleeding, Gland problems, HIV and Persistent Infections.  Vitals (Tanisha A. Brown RMA; 07/09/2017 2:06 PM) 07/09/2017 2:06 PM Weight: 180.4 lb Height: 62in Body Surface Area: 1.83 m Body Mass Index: 33 kg/m  Temp.: 98.52F  Pulse: 79 (Regular)  P.OX: 99% (Room air) BP: 132/88 (Sitting, Left Arm, Standard)      Physical Exam (Nichoals Heyde A. Monquie Fulgham MD; 07/09/2017 3:45 PM)  General Mental Status-Alert. General Appearance-Consistent with stated age. Hydration-Well hydrated. Voice-Normal.  Chest and Lung Exam Chest and lung exam reveals -quiet, even and easy respiratory effort with no use of accessory muscles and on auscultation, normal breath sounds, no adventitious sounds and normal vocal resonance. Inspection Chest Wall - Normal. Back - normal.  Breast Breast - Left-Symmetric, Non Tender, No Biopsy scars, no Dimpling, No Inflammation, No Lumpectomy scars, No Mastectomy scars, No Peau d' Orange. Breast - Right-Symmetric, Non Tender, No Biopsy scars, no Dimpling, No Inflammation, No Lumpectomy scars, No Mastectomy scars, No Peau d' Orange. Breast Lump-No Palpable Breast Mass.  Cardiovascular Cardiovascular examination reveals -normal heart sounds, regular rate and rhythm with no murmurs and normal pedal pulses bilaterally.  Musculoskeletal Normal Exam - Left-Upper Extremity  Strength Normal and Lower Extremity Strength Normal. Normal Exam - Right-Upper Extremity Strength Normal and Lower Extremity Strength Normal.  Lymphatic Head & Neck  General Head & Neck Lymphatics: Bilateral - Description - Normal. Axillary  General Axillary Region: Bilateral - Description - Normal. Tenderness - Non Tender.    Assessment & Plan [name]  A. Glendale Youngblood MD; 07/09/2017 3:45 PM)  PAPILLOMA OF RIGHT BREAST (D24.1) Impression: Risk of lumpectomy include bleeding, infection, seroma, more surgery, use of seed/wire, wound care, cosmetic deformity and the need for other treatments, death , blood clots, death. Pt agrees to proceed.   Discussed risk of malignancy or 3%. Observation offered. Patient has opted for right breast seed localized lumpectomy.  Current Plans Pt Education - [facility] Breast Biopsy HCI: discussed with patient and provided information. Pt Education - Pamphlet Given - Breast Biopsy: discussed with patient and provided information. You are being scheduled for surgery- Our schedulers will call you.  You should hear from our office's scheduling department within 5 working days about the location, date, and time of surgery. We try to make accommodations for patient's preferences in scheduling surgery, but sometimes the OR schedule or the surgeon's schedule prevents [place] from making those accommodations.  If you have not heard from our office [phone]) in 5 working days, call the office and ask for your surgeon's nurse.  If you have other questions about your diagnosis, plan, or surgery, call the office and ask for your surgeon's nurse.  Pt Education - flb breast cancer surgery: discussed with patient and provided information.

## 2017-07-17 ENCOUNTER — Encounter: Payer: Medicare Other | Admitting: Family Medicine

## 2017-07-21 HISTORY — PX: BREAST EXCISIONAL BIOPSY: SUR124

## 2017-08-07 ENCOUNTER — Encounter (HOSPITAL_BASED_OUTPATIENT_CLINIC_OR_DEPARTMENT_OTHER): Payer: Self-pay | Admitting: *Deleted

## 2017-08-09 ENCOUNTER — Encounter (HOSPITAL_BASED_OUTPATIENT_CLINIC_OR_DEPARTMENT_OTHER)
Admission: RE | Admit: 2017-08-09 | Discharge: 2017-08-09 | Disposition: A | Discharge status: Home / Self Care | Payer: Medicare Other | Source: Ambulatory Visit | Attending: Surgery | Admitting: Surgery

## 2017-08-09 DIAGNOSIS — Z01812 Encounter for preprocedural laboratory examination: Secondary | ICD-10-CM | POA: Diagnosis not present

## 2017-08-09 DIAGNOSIS — Z01810 Encounter for preprocedural cardiovascular examination: Secondary | ICD-10-CM | POA: Diagnosis not present

## 2017-08-09 DIAGNOSIS — B351 Tinea unguium: Secondary | ICD-10-CM | POA: Insufficient documentation

## 2017-08-09 DIAGNOSIS — E785 Hyperlipidemia, unspecified: Secondary | ICD-10-CM | POA: Diagnosis not present

## 2017-08-09 DIAGNOSIS — K219 Gastro-esophageal reflux disease without esophagitis: Secondary | ICD-10-CM | POA: Diagnosis not present

## 2017-08-09 DIAGNOSIS — I517 Cardiomegaly: Secondary | ICD-10-CM | POA: Insufficient documentation

## 2017-08-09 DIAGNOSIS — E0590 Thyrotoxicosis, unspecified without thyrotoxic crisis or storm: Secondary | ICD-10-CM | POA: Insufficient documentation

## 2017-08-09 DIAGNOSIS — I773 Arterial fibromuscular dysplasia: Secondary | ICD-10-CM | POA: Diagnosis not present

## 2017-08-09 DIAGNOSIS — F411 Generalized anxiety disorder: Secondary | ICD-10-CM | POA: Insufficient documentation

## 2017-08-09 DIAGNOSIS — M810 Age-related osteoporosis without current pathological fracture: Secondary | ICD-10-CM | POA: Insufficient documentation

## 2017-08-09 DIAGNOSIS — I10 Essential (primary) hypertension: Secondary | ICD-10-CM | POA: Insufficient documentation

## 2017-08-09 LAB — BASIC METABOLIC PANEL
Anion gap: 8 (ref 5–15)
BUN: 10 mg/dL (ref 6–20)
CO2: 24 mmol/L (ref 22–32)
Calcium: 9.7 mg/dL (ref 8.9–10.3)
Chloride: 106 mmol/L (ref 101–111)
Creatinine, Ser: 0.69 mg/dL (ref 0.44–1.00)
GFR calc Af Amer: >60 mL/min (ref >60)
GFR calc non Af Amer: >60 mL/min (ref >60)
Glucose, Bld: 95 mg/dL (ref 65–99)
Potassium: 4.7 mmol/L (ref 3.5–5.1)
Sodium: 138 mmol/L (ref 135–145)

## 2017-08-09 NOTE — Progress Notes (Signed)
EKG reviewed by Dr. Lissa Hoard, will proceed with surgery as scheduled, Ensure pre surgery drink given with instructions to complete by [facility], pt verbalized understanding.

## 2017-08-13 ENCOUNTER — Ambulatory Visit
Admission: RE | Admit: 2017-08-13 | Discharge: 2017-08-13 | Disposition: A | Discharge status: Home / Self Care | Payer: Medicare Other | Source: Ambulatory Visit | Attending: Surgery | Admitting: Surgery

## 2017-08-13 DIAGNOSIS — D241 Benign neoplasm of right breast: Secondary | ICD-10-CM | POA: Diagnosis not present

## 2017-08-14 ENCOUNTER — Encounter (HOSPITAL_BASED_OUTPATIENT_CLINIC_OR_DEPARTMENT_OTHER): Payer: Self-pay | Admitting: *Deleted

## 2017-08-14 ENCOUNTER — Ambulatory Visit (HOSPITAL_BASED_OUTPATIENT_CLINIC_OR_DEPARTMENT_OTHER)
Admission: RE | Admit: 2017-08-14 | Discharge: 2017-08-14 | Disposition: A | Discharge status: Home / Self Care | Payer: Medicare Other | Source: Ambulatory Visit | Attending: Surgery | Admitting: Surgery

## 2017-08-14 ENCOUNTER — Ambulatory Visit
Admission: RE | Admit: 2017-08-14 | Discharge: 2017-08-14 | Disposition: A | Discharge status: Home / Self Care | Payer: Medicare Other | Source: Ambulatory Visit | Attending: Surgery | Admitting: Surgery

## 2017-08-14 ENCOUNTER — Ambulatory Visit (HOSPITAL_BASED_OUTPATIENT_CLINIC_OR_DEPARTMENT_OTHER): Payer: Medicare Other | Admitting: Anesthesiology

## 2017-08-14 ENCOUNTER — Encounter (HOSPITAL_BASED_OUTPATIENT_CLINIC_OR_DEPARTMENT_OTHER)
Admission: RE | Disposition: A | Discharge status: Home / Self Care | Payer: Self-pay | Source: Ambulatory Visit | Attending: Surgery

## 2017-08-14 DIAGNOSIS — Z79899 Other long term (current) drug therapy: Secondary | ICD-10-CM | POA: Insufficient documentation

## 2017-08-14 DIAGNOSIS — I10 Essential (primary) hypertension: Secondary | ICD-10-CM | POA: Insufficient documentation

## 2017-08-14 DIAGNOSIS — Z8042 Family history of malignant neoplasm of prostate: Secondary | ICD-10-CM | POA: Diagnosis not present

## 2017-08-14 DIAGNOSIS — D241 Benign neoplasm of right breast: Secondary | ICD-10-CM | POA: Diagnosis not present

## 2017-08-14 DIAGNOSIS — K219 Gastro-esophageal reflux disease without esophagitis: Secondary | ICD-10-CM | POA: Diagnosis not present

## 2017-08-14 DIAGNOSIS — E669 Obesity, unspecified: Secondary | ICD-10-CM | POA: Diagnosis not present

## 2017-08-14 DIAGNOSIS — E785 Hyperlipidemia, unspecified: Secondary | ICD-10-CM | POA: Diagnosis not present

## 2017-08-14 DIAGNOSIS — Z6832 Body mass index (BMI) 32.0-32.9, adult: Secondary | ICD-10-CM | POA: Diagnosis not present

## 2017-08-14 DIAGNOSIS — Z87891 Personal history of nicotine dependence: Secondary | ICD-10-CM | POA: Insufficient documentation

## 2017-08-14 DIAGNOSIS — Z7982 Long term (current) use of aspirin: Secondary | ICD-10-CM | POA: Diagnosis not present

## 2017-08-14 DIAGNOSIS — R7303 Prediabetes: Secondary | ICD-10-CM | POA: Diagnosis not present

## 2017-08-14 DIAGNOSIS — N6489 Other specified disorders of breast: Secondary | ICD-10-CM | POA: Insufficient documentation

## 2017-08-14 DIAGNOSIS — R928 Other abnormal and inconclusive findings on diagnostic imaging of breast: Secondary | ICD-10-CM | POA: Diagnosis not present

## 2017-08-14 HISTORY — PX: BREAST LUMPECTOMY WITH RADIOACTIVE SEED LOCALIZATION: SHX6424

## 2017-08-14 HISTORY — DX: Benign neoplasm of unspecified breast: D24.9

## 2017-08-14 SURGERY — BREAST LUMPECTOMY WITH RADIOACTIVE SEED LOCALIZATION
Anesthesia: General | Site: Breast | Laterality: Right

## 2017-08-14 MED ORDER — EPHEDRINE SULFATE-NACL 50-0.9 MG/10ML-% IV SOSY
PREFILLED_SYRINGE | INTRAVENOUS | Status: DC | PRN
  Administered 2017-08-14 (×2): 10 mg via INTRAVENOUS

## 2017-08-14 MED ORDER — CHLORHEXIDINE GLUCONATE CLOTH 2 % EX PADS: 6.0000 | MEDICATED_PAD | Freq: Once | CUTANEOUS | Status: DC

## 2017-08-14 MED ORDER — DEXTROSE 5 % IV SOLN: 3.0000 g | INTRAVENOUS | Status: DC

## 2017-08-14 MED ORDER — MIDAZOLAM HCL 2 MG/2ML IJ SOLN: 1.0000 mg | INTRAMUSCULAR | Status: DC | PRN

## 2017-08-14 MED ORDER — LIDOCAINE 2% (20 MG/ML) 5 ML SYRINGE
INTRAMUSCULAR | Status: AC
  Filled 2017-08-14: qty 5

## 2017-08-14 MED ORDER — FENTANYL CITRATE (PF) 100 MCG/2ML IJ SOLN
50.0000 ug | INTRAMUSCULAR | Status: DC | PRN
  Administered 2017-08-14: 100 ug via INTRAVENOUS

## 2017-08-14 MED ORDER — LIDOCAINE 2% (20 MG/ML) 5 ML SYRINGE
INTRAMUSCULAR | Status: DC | PRN
  Administered 2017-08-14: 80 mg via INTRAVENOUS

## 2017-08-14 MED ORDER — EPHEDRINE 5 MG/ML INJ
INTRAVENOUS | Status: AC
  Filled 2017-08-14: qty 10

## 2017-08-14 MED ORDER — MEPERIDINE HCL 25 MG/ML IJ SOLN: 6.2500 mg | INTRAMUSCULAR | Status: DC | PRN

## 2017-08-14 MED ORDER — ONDANSETRON HCL 4 MG/2ML IJ SOLN
INTRAMUSCULAR | Status: DC | PRN
  Administered 2017-08-14: 4 mg via INTRAVENOUS

## 2017-08-14 MED ORDER — GABAPENTIN 300 MG PO CAPS
ORAL_CAPSULE | ORAL | Status: AC
  Filled 2017-08-14: qty 1

## 2017-08-14 MED ORDER — FENTANYL CITRATE (PF) 100 MCG/2ML IJ SOLN: 25.0000 ug | INTRAMUSCULAR | Status: DC | PRN

## 2017-08-14 MED ORDER — DEXAMETHASONE SODIUM PHOSPHATE 10 MG/ML IJ SOLN
INTRAMUSCULAR | Status: AC
  Filled 2017-08-14: qty 1

## 2017-08-14 MED ORDER — GABAPENTIN 300 MG PO CAPS
300.0000 mg | ORAL_CAPSULE | ORAL | Status: AC
  Administered 2017-08-14: 300 mg via ORAL

## 2017-08-14 MED ORDER — PROPOFOL 10 MG/ML IV BOLUS
INTRAVENOUS | Status: DC | PRN
  Administered 2017-08-14: 150 mg via INTRAVENOUS

## 2017-08-14 MED ORDER — ONDANSETRON HCL 4 MG/2ML IJ SOLN
INTRAMUSCULAR | Status: AC
  Filled 2017-08-14: qty 2

## 2017-08-14 MED ORDER — SCOPOLAMINE 1 MG/3DAYS TD PT72: 1.0000 | MEDICATED_PATCH | Freq: Once | TRANSDERMAL | Status: DC | PRN

## 2017-08-14 MED ORDER — OXYCODONE HCL 5 MG/5ML PO SOLN: 5.0000 mg | Freq: Once | ORAL | Status: DC | PRN

## 2017-08-14 MED ORDER — LACTATED RINGERS IV SOLN
INTRAVENOUS | Status: DC
  Administered 2017-08-14: 08:00:00 via INTRAVENOUS

## 2017-08-14 MED ORDER — CEFAZOLIN SODIUM-DEXTROSE 2-4 GM/100ML-% IV SOLN
INTRAVENOUS | Status: AC
  Filled 2017-08-14: qty 100

## 2017-08-14 MED ORDER — METOCLOPRAMIDE HCL 5 MG/ML IJ SOLN: 10.0000 mg | Freq: Once | INTRAMUSCULAR | Status: DC | PRN

## 2017-08-14 MED ORDER — OXYCODONE HCL 5 MG PO TABS: 5.0000 mg | ORAL_TABLET | Freq: Once | ORAL | Status: DC | PRN

## 2017-08-14 MED ORDER — ACETAMINOPHEN 500 MG PO TABS
ORAL_TABLET | ORAL | Status: AC
  Filled 2017-08-14: qty 2

## 2017-08-14 MED ORDER — FENTANYL CITRATE (PF) 100 MCG/2ML IJ SOLN
INTRAMUSCULAR | Status: AC
  Filled 2017-08-14: qty 2

## 2017-08-14 MED ORDER — CEFAZOLIN SODIUM-DEXTROSE 2-4 GM/100ML-% IV SOLN
2.0000 g | INTRAVENOUS | Status: AC
  Administered 2017-08-14: 2 g via INTRAVENOUS

## 2017-08-14 MED ORDER — DEXAMETHASONE SODIUM PHOSPHATE 4 MG/ML IJ SOLN
INTRAMUSCULAR | Status: DC | PRN
  Administered 2017-08-14: 10 mg via INTRAVENOUS

## 2017-08-14 MED ORDER — BUPIVACAINE-EPINEPHRINE (PF) 0.25% -1:200000 IJ SOLN
INTRAMUSCULAR | Status: DC | PRN
  Administered 2017-08-14: 20 mL

## 2017-08-14 MED ORDER — HYDROCODONE-ACETAMINOPHEN 5-325 MG PO TABS: 1.0000 | ORAL_TABLET | Freq: Four times a day (QID) | ORAL | 0 refills | Status: DC | PRN

## 2017-08-14 MED ORDER — ACETAMINOPHEN 500 MG PO TABS
1000.0000 mg | ORAL_TABLET | ORAL | Status: AC
  Administered 2017-08-14: 1000 mg via ORAL

## 2017-08-14 SURGICAL SUPPLY — 51 items
ADH SKN CLS APL DERMABOND .7 (GAUZE/BANDAGES/DRESSINGS) ×1
APPLIER CLIP 9.375 MED OPEN (MISCELLANEOUS)
APR CLP MED 9.3 20 MLT OPN (MISCELLANEOUS)
BINDER BREAST LRG (GAUZE/BANDAGES/DRESSINGS) IMPLANT
BINDER BREAST MEDIUM (GAUZE/BANDAGES/DRESSINGS) IMPLANT
BINDER BREAST XLRG (GAUZE/BANDAGES/DRESSINGS) IMPLANT
BINDER BREAST XXLRG (GAUZE/BANDAGES/DRESSINGS) ×1 IMPLANT
BLADE SURG 15 STRL LF DISP TIS (BLADE) ×1 IMPLANT
BLADE SURG 15 STRL SS (BLADE) ×2
CANISTER SUC SOCK COL 7IN (MISCELLANEOUS) IMPLANT
CANISTER SUCT 1200ML W/VALVE (MISCELLANEOUS) IMPLANT
CHLORAPREP W/TINT 26ML (MISCELLANEOUS) ×2 IMPLANT
CLIP APPLIE 9.375 MED OPEN (MISCELLANEOUS) IMPLANT
COVER BACK TABLE 60X90IN (DRAPES) ×2 IMPLANT
COVER MAYO STAND STRL (DRAPES) ×2 IMPLANT
COVER PROBE W GEL 5X96 (DRAPES) ×2 IMPLANT
DECANTER SPIKE VIAL GLASS SM (MISCELLANEOUS) IMPLANT
DERMABOND ADVANCED (GAUZE/BANDAGES/DRESSINGS) ×1
DERMABOND ADVANCED .7 DNX12 (GAUZE/BANDAGES/DRESSINGS) ×1 IMPLANT
DEVICE DUBIN W/COMP PLATE 8390 (MISCELLANEOUS) ×2 IMPLANT
DRAPE LAPAROTOMY 100X72 PEDS (DRAPES) ×2 IMPLANT
DRAPE UTILITY XL STRL (DRAPES) ×2 IMPLANT
ELECT COATED BLADE 2.86 ST (ELECTRODE) ×2 IMPLANT
ELECT REM PT RETURN 9FT ADLT (ELECTROSURGICAL) ×2
ELECTRODE REM PT RTRN 9FT ADLT (ELECTROSURGICAL) ×1 IMPLANT
GLOVE BIO SURGEON STRL SZ7 (GLOVE) ×1 IMPLANT
GLOVE BIOGEL PI IND STRL 7.0 (GLOVE) IMPLANT
GLOVE BIOGEL PI IND STRL 8 (GLOVE) ×1 IMPLANT
GLOVE BIOGEL PI INDICATOR 7.0 (GLOVE) ×1
GLOVE BIOGEL PI INDICATOR 8 (GLOVE) ×1
GLOVE ECLIPSE 8.0 STRL XLNG CF (GLOVE) ×2 IMPLANT
GOWN STRL REUS W/ TWL LRG LVL3 (GOWN DISPOSABLE) ×2 IMPLANT
GOWN STRL REUS W/TWL LRG LVL3 (GOWN DISPOSABLE) ×4
HEMOSTAT ARISTA ABSORB 3G PWDR (MISCELLANEOUS) IMPLANT
HEMOSTAT SNOW SURGICEL 2X4 (HEMOSTASIS) IMPLANT
KIT MARKER MARGIN INK (KITS) ×2 IMPLANT
NDL HYPO 25X1 1.5 SAFETY (NEEDLE) ×1 IMPLANT
NEEDLE HYPO 25X1 1.5 SAFETY (NEEDLE) ×2 IMPLANT
NS IRRIG 1000ML POUR BTL (IV SOLUTION) ×2 IMPLANT
PACK BASIN DAY SURGERY FS (CUSTOM PROCEDURE TRAY) ×2 IMPLANT
PENCIL BUTTON HOLSTER BLD 10FT (ELECTRODE) ×2 IMPLANT
SLEEVE SCD COMPRESS KNEE MED (MISCELLANEOUS) ×2 IMPLANT
SPONGE LAP 4X18 X RAY DECT (DISPOSABLE) ×2 IMPLANT
SUT MNCRL AB 4-0 PS2 18 (SUTURE) ×2 IMPLANT
SUT SILK 2 0 SH (SUTURE) IMPLANT
SUT VICRYL 3-0 CR8 SH (SUTURE) ×2 IMPLANT
SYR CONTROL 10ML LL (SYRINGE) ×2 IMPLANT
TOWEL OR 17X24 6PK STRL BLUE (TOWEL DISPOSABLE) ×2 IMPLANT
TOWEL OR NON WOVEN STRL DISP B (DISPOSABLE) ×2 IMPLANT
TUBE CONNECTING 20X1/4 (TUBING) IMPLANT
YANKAUER SUCT BULB TIP NO VENT (SUCTIONS) IMPLANT

## 2017-08-14 NOTE — Transfer of Care (Signed)
Immediate Anesthesia Transfer of Care Note  Patient: Kristy [name]  Procedure(s) Performed: Procedure(s): RIGHT BREAST LUMPECTOMY WITH RADIOACTIVE SEED LOCALIZATION (Right)  Patient Location: PACU  Anesthesia Type:General  Level of Consciousness: awake, sedated and responds to stimulation  Airway & Oxygen Therapy: Patient Spontanous Breathing and Patient connected to face mask oxygen  Post-op Assessment: Report given to RN and Post -op Vital signs reviewed and stable  Post vital signs: Reviewed and stable  Last Vitals:  Vitals:   08/14/17 0746  BP: (!) 159/73  Pulse: 71  Resp: 20  Temp: 36.7 C  SpO2: 100%    Last Pain:  Vitals:   08/14/17 0746  TempSrc: Oral         Complications: No apparent anesthesia complications

## 2017-08-14 NOTE — Discharge Instructions (Signed)
°Post Anesthesia Home Care Instructions ° °Activity: °Get plenty of rest for the remainder of the day. A responsible individual must stay with you for 24 hours following the procedure.  °For the next 24 hours, DO NOT: °-Drive a car °-Operate machinery °-Drink alcoholic beverages °-Take any medication unless instructed by your physician °-Make any legal decisions or sign important papers. ° °Meals: °Start with liquid foods such as gelatin or soup. Progress to regular foods as tolerated. Avoid greasy, spicy, heavy foods. If nausea and/or vomiting occur, drink only clear liquids until the nausea and/or vomiting subsides. Call your physician if vomiting continues. ° °Special Instructions/Symptoms: °Your throat may feel dry or sore from the anesthesia or the breathing tube placed in your throat during surgery. If this causes discomfort, gargle with warm salt water. The discomfort should disappear within 24 hours. ° °If you had a scopolamine patch placed behind your ear for the management of post- operative nausea and/or vomiting: ° °1. The medication in the patch is effective for 72 hours, after which it should be removed.  Wrap patch in a tissue and discard in the trash. Wash hands thoroughly with soap and water. °2. You may remove the patch earlier than 72 hours if you experience unpleasant side effects which may include dry mouth, dizziness or visual disturbances. °3. Avoid touching the patch. Wash your hands with soap and water after contact with the patch. °  ° ° ° ° °[facility] °Office Phone Number [phone] ° °BREAST BIOPSY/ PARTIAL MASTECTOMY: POST OP INSTRUCTIONS ° °Always review your discharge instruction sheet given to you by the facility where your surgery was performed. ° °IF YOU HAVE DISABILITY OR FAMILY LEAVE FORMS, YOU MUST BRING THEM TO THE OFFICE FOR PROCESSING.  DO NOT GIVE THEM TO YOUR DOCTOR. ° °1. A prescription for pain medication may be given to you upon discharge.  Take your  pain medication as prescribed, if needed.  If narcotic pain medicine is not needed, then you may take acetaminophen (Tylenol) or ibuprofen (Advil) as needed. °2. Take your usually prescribed medications unless otherwise directed °3. If you need a refill on your pain medication, please contact your pharmacy.  They will contact our office to request authorization.  Prescriptions will not be filled after 5pm or on week-ends. °4. You should eat very light the first 24 hours after surgery, such as soup, crackers, pudding, etc.  Resume your normal diet the day after surgery. °5. Most patients will experience some swelling and bruising in the breast.  Ice packs and a good support bra will help.  Swelling and bruising can take several days to resolve.  °6. It is common to experience some constipation if taking pain medication after surgery.  Increasing fluid intake and taking a stool softener will usually help or prevent this problem from occurring.  A mild laxative (Milk of Magnesia or Miralax) should be taken according to package directions if there are no bowel movements after 48 hours. °7. Unless discharge instructions indicate otherwise, you may remove your bandages 24-48 hours after surgery, and you may shower at that time.  You may have steri-strips (small skin tapes) in place directly over the incision.  These strips should be left on the skin for 7-10 days.  If your surgeon used skin glue on the incision, you may shower in 24 hours.  The glue will flake off over the next 2-3 weeks.  Any sutures or staples will be removed at the office during your follow-up visit. °  8. ACTIVITIES:  You may resume regular daily activities (gradually increasing) beginning the next day.  Wearing a good support bra or sports bra minimizes pain and swelling.  You may have sexual intercourse when it is comfortable. °a. You may drive when you no longer are taking prescription pain medication, you can comfortably wear a seatbelt, and you can  safely maneuver your car and apply brakes. °b. RETURN TO WORK:  ______________________________________________________________________________________ °9. You should see your doctor in the office for a follow-up appointment approximately two weeks after your surgery.  Your doctor’s nurse will typically make your follow-up appointment when she calls you with your pathology report.  Expect your pathology report 2-3 business days after your surgery.  You may call to check if you do not hear from us after three days. °10. OTHER INSTRUCTIONS: _______________________________________________________________________________________________ _____________________________________________________________________________________________________________________________________ °_____________________________________________________________________________________________________________________________________ °_____________________________________________________________________________________________________________________________________ ° °WHEN TO CALL YOUR DOCTOR: °1. Fever over 101.0 °2. Nausea and/or vomiting. °3. Extreme swelling or bruising. °4. Continued bleeding from incision. °5. Increased pain, redness, or drainage from the incision. ° °The clinic staff is available to answer your questions during regular business hours.  Please don’t hesitate to call and ask to speak to one of the nurses for clinical concerns.  If you have a medical emergency, go to the nearest emergency room or call 911.  A surgeon from [facility] is always on call at the hospital. ° °For further questions, please visit [email or web address]  °

## 2017-08-14 NOTE — Anesthesia Postprocedure Evaluation (Signed)
Anesthesia Post Note  Patient: Kristy [name]  Procedure(s) Performed: Procedure(s) (LRB): RIGHT BREAST LUMPECTOMY WITH RADIOACTIVE SEED LOCALIZATION (Right)     Patient location during evaluation: PACU Anesthesia Type: General Level of consciousness: awake and alert and oriented Pain management: pain level controlled Vital Signs Assessment: post-procedure vital signs reviewed and stable Respiratory status: spontaneous breathing, nonlabored ventilation and respiratory function stable Cardiovascular status: blood pressure returned to baseline and stable Postop Assessment: no apparent nausea or vomiting Anesthetic complications: no    Last Vitals:  Vitals:   08/14/17 0945 08/14/17 0955  BP: 136/69 136/69  Pulse: 65 65  Resp: 16 16  Temp:    SpO2: 98% 100%    Last Pain:  Vitals:   08/14/17 0955  TempSrc:   PainSc: 0-No pain                 [name] A.

## 2017-08-14 NOTE — H&P (Signed)
H&P Encounter Date:  [name], MD  Surgery    [] [facility] copied text Kristy [name] 07/09/2017 2:05 PM Location: [facility] Patient #: [id number] DOB: 1941/06/11 Widowed / Language: [name] / Race: Black or African American Female  History of Present Illness [name] A. [name] MD; 18 3:45 PM) Patient words: Patient's at the request of Dr. Teressa Senter for mammographic abnormality on screening mammogram. The patient had an area of microcalcifications and distortion in the right breast. 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 She is sent to me at the request of Dr. Darcel Smalling for this. The area measures 4 mm in maximal diameter. Patient denies any previous history of breast problems or history of breast cancer.               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 - SEE MICROSCOPIC DESCRIPTION. Microscopic Comment Called to [facility] on 07/02/2017. (JDP:ecj 07/02/2017) Claudette Laws MD Pathologist, Electronic Signature (Case signed 07/02/2017) Specimen Gross and Clinical Information Specimen Comment In formalin 3:15 pm extracted < 2 min; mass Specimen(s) Obtained: Breast, right, needle core biopsy, 3 o'clock Specimen Clinical Information FA; papilloma; Marion Gross Received labeled "Khachatryan,Jina" and "rt breast 300 4cm" (TIF 1515 CIT <25min) are 4 cores of gray white to dark red soft tissue, ranging from 0.2 x 0.1 x 0.1 cm to 0.4 x 0.1 x 0.1 cm. One block submitted. (SSW 8/10) Report signed out from the following location(s) Technical Component was performed at [facility]. [address],[place] [address].[id number]., Interpretation was performed at [facility] [address], [place], [place] [address]. CLIA #: [id number], 1 of 2       CLINICAL DATA: Patient returns after screening study for  evaluation of possible right breast mass. EXAM: 2D DIGITAL DIAGNOSTIC RIGHT MAMMOGRAM WITH CAD AND ADJUNCT TOMO ULTRASOUND RIGHT BREAST COMPARISON: 06/15/2017 and earlier ACR Breast Density Category b: There are scattered areas of fibroglandular density. FINDINGS: Additional 2-D and 3-D images are performed. These views demonstrate a persistent oval mass in the lower inner quadrant of the right breast measuring 5 mm in diameter. Mammographic images were processed with CAD. On physical exam, I palpate no abnormality in the medial aspect of the right breast. Targeted ultrasound is performed, showing a circumscribed hypoechoic parallel oval mass in the 3 o'clock location of the right breast 4 cm from nipple. Mass is associated increased through transmission no internal vascularity. Mass measures 0.4 x 0.5 x 0.3 cm. Evaluation of the axilla is negative for adenopathy. IMPRESSION: Persistent circumscribed oval mass in the medial aspect of the right breast warranting tissue diagnosis. 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 She will continue her aspirin regimen for the biopsy. RECOMMENDATION: Ultrasound-guided core biopsy is recommended and scheduled for the patient. I have discussed the findings and recommendations with the patient. Results were also provided in writing at the conclusion of the visit. If applicable, a reminder letter will be sent to the patient regarding the next appointment. BI-RADS CATEGORY 4: Suspicious. Electronically Signed By: Nolon Nations M.D. On: 06/26/2017 15:08 .  The patient is a 76 year old female.   Past Surgical History (Tanisha A. Owens Shark, RMA; 8 2:05 PM) Colon Polyp Removal - Colonoscopy Hysterectomy (not due to cancer) - Partial Knee Surgery Right.  Diagnostic Studies History (Tanisha A. Owens Shark, RMA; 2:05 PM) Colonoscopy 5-10 years  ago Mammogram within last year  Allergies (Tanisha A. Owens Shark, [place]; 07/09/2017 2:06 PM) No Known Drug Allergies 07/09/2017 Allergies Reconciled  Medication History (Tanisha A. Brown, RMA;  2:07 PM) AmLODIPine Besylate (5MG  Tablet, Oral) Active. Metoprolol Succinate ER (100MG  Tablet ER 24HR, Oral) Active. Pantoprazole Sodium (40MG  Tablet DR, Oral) Active. Aspirin (81MG  Tablet, Oral) Active. Medications Reconciled  Social History (Tanisha A. Owens Shark, [place]; 07/09/2017 2:05 PM) Alcohol use Occasional alcohol use. No caffeine use No drug use Tobacco use Former smoker.  Family History (Tanisha A. Owens Shark, [place]; 07/09/2017 2:05 PM) Prostate Cancer Father.  Pregnancy / Birth History (Tanisha A. Owens Shark, RMA; Gravida 0 Para 0  Other Problems (Tanisha A. Owens Shark, [place]; Gastroesophageal Reflux Disease High blood pressure Vascular Disease     Review of Systems (Tanisha A. Brown RMA; 07/09/2017 2:05 PM) General Not Present- Appetite Loss, Chills, Fatigue, Fever, Night Sweats, Weight Gain and Weight Loss. Skin Not Present- Change in Wart/Mole, Dryness, Hives, Jaundice, New Lesions, Non-Healing Wounds, Rash and Ulcer. HEENT Present- Ringing in the Ears, Seasonal Allergies and Wears glasses/contact lenses. Not Present- Earache, Hearing Loss, Hoarseness, Nose Bleed, Oral Ulcers, Sinus Pain, Sore Throat, Visual Disturbances and Yellow Eyes. Respiratory Not Present- Bloody sputum, Chronic Cough, Difficulty Breathing, Snoring and Wheezing. Breast Present- Breast Mass. Not Present- Breast Pain, Nipple Discharge and Skin Changes. Cardiovascular Present- Swelling of Extremities. Not Present- Chest Pain, Difficulty Breathing Lying Down, Leg Cramps, Palpitations, Rapid Heart Rate and Shortness of Breath. Gastrointestinal Not Present- Abdominal Pain, Bloating, Bloody Stool, Change in Bowel Habits, Chronic diarrhea, Constipation, Difficulty Swallowing, Excessive gas, Gets full quickly at  meals, Hemorrhoids, Indigestion, Nausea, Rectal Pain and Vomiting. Female Genitourinary Not Present- Frequency, Nocturia, Painful Urination, Pelvic Pain and Urgency. Neurological Not Present- Decreased Memory, Fainting, Headaches, Numbness, Seizures, Tingling, Tremor, Trouble walking and Weakness. Psychiatric Not Present- Anxiety, Bipolar, Change in Sleep Pattern, Depression, Fearful and Frequent crying. Endocrine Not Present- Cold Intolerance, Excessive Hunger, Hair Changes, Heat Intolerance, Hot flashes and New Diabetes. Hematology Not Present- Blood Thinners, Easy Bruising, Excessive bleeding, Gland problems, HIV and Persistent Infections.  Vitals (Tanisha A. Brown RMA; :06 PM) 07/09/2017 2:06 PM Weight: 180.4 lb Height: 62in Body Surface Area: 1.83 m Body Mass Index: 33 kg/m  Temp.: 98.47F  Pulse: 79 (Regular)  P.OX: 99% (Room air) BP: 132/88 (Sitting, Left Arm, Standard)      Physical Exam (Shondrika Hoque A. Artis Beggs MD;:45 PM)  General Mental Status-Alert. General Appearance-Consistent with stated age. Hydration-Well hydrated. Voice-Normal.  Chest and Lung Exam Chest and lung exam reveals -quiet, even and easy respiratory effort with no use of accessory muscles and on auscultation, normal breath sounds, no adventitious sounds and normal vocal resonance. Inspection Chest Wall - Normal. Back - normal.  Breast Breast - Left-Symmetric, Non Tender, No Biopsy scars, no Dimpling, No Inflammation, No Lumpectomy scars, No Mastectomy scars, No Peau d' Orange. Breast - Right-Symmetric, Non Tender, No Biopsy scars, no Dimpling, No Inflammation, No Lumpectomy scars, No Mastectomy scars, No Peau d' Orange. Breast Lump-No Palpable Breast Mass.  Cardiovascular Cardiovascular examination reveals -normal heart sounds, regular rate and rhythm with no murmurs and normal pedal pulses bilaterally.  Musculoskeletal Normal Exam - Left-Upper Extremity Strength  Normal and Lower Extremity Strength Normal. Normal Exam - Right-Upper Extremity Strength Normal and Lower Extremity Strength Normal.  Lymphatic Head & Neck  General Head & Neck Lymphatics: Bilateral - Description - Normal. Axillary  General Axillary Region: Bilateral - Description - Normal. Tenderness - Non Tender.    Assessment & Plan [name]  A. Edwyn Inclan MD; :75 PM)  PAPILLOMA OF RIGHT BREAST (D24.1) Impression: Risk of lumpectomy include bleeding, infection, seroma, more surgery, use of seed/wire, wound care, cosmetic deformity and the need for other treatments, death , blood clots, death. Pt agrees to proceed.   Discussed risk of malignancy or 3%. Observation offered. Patient has opted for right breast seed localized lumpectomy.  Current Plans Pt Education - [facility] Breast Biopsy HCI: discussed with patient and provided information. Pt Education - Pamphlet Given - Breast Biopsy: discussed with patient and provided information. You are being scheduled for surgery- Our schedulers will call you.  You should hear from our office's scheduling department within 5 working days about the location, date, and time of surgery. We try to make accommodations for patient's preferences in scheduling surgery, but sometimes the OR schedule or the surgeon's schedule prevents [place] from making those accommodations.  If you have not heard from our office [phone]) in 5 working days, call the office and ask for your surgeon's nurse.  If you have other questions about your diagnosis, plan, or surgery, call the office and ask for your surgeon's nurse.  Pt Education - flb breast cancer surgery: discussed with patient and provided information.

## 2017-08-14 NOTE — Anesthesia Preprocedure Evaluation (Signed)
Anesthesia Evaluation  Patient identified by MRN, date of birth, ID band Patient awake    Reviewed: Allergy & Precautions, NPO status , Patient's Chart, lab work & pertinent test results  Airway Mallampati: II  TM Distance: >3 FB Neck ROM: Full    Dental no notable dental hx. (+) Teeth Intact, Caps   Pulmonary former smoker,    Pulmonary exam normal breath sounds clear to auscultation       Cardiovascular hypertension, Pt. on medications and Pt. 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  negative genitourinary   Musculoskeletal  (+) Arthritis , Osteoarthritis,    Abdominal (+) + obese,   Peds  Hematology negative hematology ROS (+)   Anesthesia Other Findings   Reproductive/Obstetrics                             Anesthesia Physical Anesthesia Plan  ASA: II  Anesthesia Plan: General   Post-op Pain Management:    Induction: Intravenous  PONV Risk Score and Plan: 4 or greater and Ondansetron, Dexamethasone, Midazolam and Propofol infusion  Airway Management Planned: LMA  Additional Equipment:   Intra-op Plan:   Post-operative Plan: Extubation in OR  Informed Consent: I have reviewed the patients History and Physical, chart, labs and discussed the procedure including the risks, benefits and alternatives for the proposed anesthesia with the patient or authorized representative who has indicated his/her understanding and acceptance.   Dental advisory given  Plan Discussed with: CRNA, Anesthesiologist and Surgeon  Anesthesia Plan Comments:         Anesthesia  Quick Evaluation

## 2017-08-14 NOTE — Interval H&P Note (Signed)
History and Physical Interval Note:  08/14/2017 8:18 AM  Kristy [name]  has presented today for surgery, with the diagnosis of papilloma  The various methods of treatment have been discussed with the patient and family. After consideration of risks, benefits and other options for treatment, the patient has consented to  Procedure(s): RIGHT BREAST LUMPECTOMY WITH RADIOACTIVE SEED LOCALIZATION ERAS PATHWAY (Right) as a surgical intervention .  The patient's history has been reviewed, patient examined, no change in status, stable for surgery.  I have reviewed the patient's chart and labs.  Questions were answered to the patient's satisfaction.     Aryav Wimberly A.

## 2017-08-14 NOTE — Op Note (Signed)
Preoperative diagnosis: right breast papilloma  Postoperative diagnosis: Same   Procedure: right breast seed localized lumpectomy  Surgeon: [name] M.D.  Anesthesia: Gen. With 0.25% Sensorcaine local  EBL: 20 cc  Specimen: right  breast tissue with clip and radioactive seed in the specimen. Verified with neoprobe and radiographic image showing both seed and clip in specimen  Indications for procedure: The patient presents for right  Breast  lumpectomy after core biopsy showed papilloma. Discussed the rationale for considering excision. Small risk of malignancy associated with papilloma lesion after core biopsy. Discussed observation. Discussed wire/seed localization. Patient desired excision of right  breast papilloma.The procedure has been discussed with the patient. Alternatives to surgery have been discussed with the patient.  Risks of surgery include bleeding,  Infection,  Seroma formation, death,  and the need for further surgery.   The patient understands and wishes to proceed.   Description of procedure: Patient underwent seed placement as an outpatient. Patient presents today for right  breast seed localized lumpectomy. Patient seen in  holding area. Patient taken back to the operating room and placed upon the OR table. After induction of general anesthesia, left breast prepped and draped in a sterile fashion. Timeout was done to verify proper patient  And  procedure. Neoprobe used and hot spot identified and left breast upper-outer quadrant. This was marked with pen. Transverse  incision made right medial  breast. Dissection used with the help of a neoprobe around the tissue where the seed and clip were located. Tissue removed in its entirety with gross negative  margins.. Neoprobe used and seed within specimen. Radiographs taken which show clip and seed in the  Specimen.  Hemostasis achieved and cavity closed with 3-0 Vicryl and 4-0 Monocryl. Dermabond applied. All final counts  found to be correct. Specimen transported to pathology. Patient awoke extubated taken to recovery in satisfactory condition.

## 2017-08-16 ENCOUNTER — Encounter (HOSPITAL_BASED_OUTPATIENT_CLINIC_OR_DEPARTMENT_OTHER): Payer: Self-pay | Admitting: Surgery

## 2017-09-04 ENCOUNTER — Ambulatory Visit (INDEPENDENT_AMBULATORY_CARE_PROVIDER_SITE_OTHER): Payer: Medicare Other

## 2017-09-04 VITALS — BP 160/90 | HR 60 | Temp 98.4°F | Ht 62.0 in | Wt 182.2 lb

## 2017-09-04 DIAGNOSIS — R7303 Prediabetes: Secondary | ICD-10-CM

## 2017-09-04 DIAGNOSIS — Z23 Encounter for immunization: Secondary | ICD-10-CM | POA: Diagnosis not present

## 2017-09-04 DIAGNOSIS — E0590 Thyrotoxicosis, unspecified without thyrotoxic crisis or storm: Secondary | ICD-10-CM | POA: Diagnosis not present

## 2017-09-04 DIAGNOSIS — E785 Hyperlipidemia, unspecified: Secondary | ICD-10-CM | POA: Diagnosis not present

## 2017-09-04 DIAGNOSIS — Z0000 Encounter for general adult medical examination without abnormal findings: Secondary | ICD-10-CM | POA: Diagnosis not present

## 2017-09-04 DIAGNOSIS — M810 Age-related osteoporosis without current pathological fracture: Secondary | ICD-10-CM | POA: Diagnosis not present

## 2017-09-04 LAB — COMPREHENSIVE METABOLIC PANEL
ALT: 10 U/L (ref 0–35)
AST: 13 U/L (ref 0–37)
Albumin: 4.1 g/dL (ref 3.5–5.2)
Alkaline Phosphatase: 71 U/L (ref 39–117)
BUN: 11 mg/dL (ref 6–23)
CO2: 27 mEq/L (ref 19–32)
Calcium: 9.5 mg/dL (ref 8.4–10.5)
Chloride: 105 mEq/L (ref 96–112)
Creatinine, Ser: 0.67 mg/dL (ref 0.40–1.20)
GFR: 110.57 mL/min (ref >60.00)
Glucose, Bld: 94 mg/dL (ref 70–99)
Potassium: 4.3 mEq/L (ref 3.5–5.1)
Sodium: 140 mEq/L (ref 135–145)
Total Bilirubin: 0.3 mg/dL (ref 0.2–1.2)
Total Protein: 7.3 g/dL (ref 6.0–8.3)

## 2017-09-04 LAB — LIPID PANEL
Cholesterol: 222 mg/dL — ABNORMAL HIGH (ref 0–200)
HDL: 54.2 mg/dL (ref >39.00)
LDL Cholesterol: 145 mg/dL — ABNORMAL HIGH (ref 0–99)
NonHDL: 168.22
Total CHOL/HDL Ratio: 4
Triglycerides: 116 mg/dL (ref 0.0–149.0)
VLDL: 23.2 mg/dL (ref 0.0–40.0)

## 2017-09-04 LAB — T4, FREE: Free T4: 0.91 ng/dL (ref 0.60–1.60)

## 2017-09-04 LAB — TSH: TSH: 3.01 u[IU]/mL (ref 0.35–4.50)

## 2017-09-04 LAB — VITAMIN D 25 HYDROXY (VIT D DEFICIENCY, FRACTURES): VITD: 26.95 ng/mL — ABNORMAL LOW (ref 30.00–100.00)

## 2017-09-04 LAB — HEMOGLOBIN A1C: Hgb A1c MFr Bld: 6 % (ref 4.6–6.5)

## 2017-09-04 LAB — T3, FREE: T3, Free: 3.6 pg/mL (ref 2.3–4.2)

## 2017-09-04 NOTE — Progress Notes (Signed)
I reviewed health advisor's note, was available for consultation, and agree with documentation and plan.  

## 2017-09-04 NOTE — Progress Notes (Signed)
Subjective:   Kristy [name] is a 76 y.o. female who presents for Medicare Annual (Subsequent) preventive examination.  Review of Systems:  N/A Cardiac Risk Factors include: advanced age (>72men, >33 women);dyslipidemia;hypertension     Objective:     Vitals: BP (!) 160/90 (BP Location: Right Arm, Patient Position: Sitting, Cuff Size: Normal) Comment: no BP medications taken  Pulse 60   Temp 98.4 F (36.9 C) (Oral)   Ht 5\' 2"  (1.575 m) Comment: no shoes  Wt 182 lb 4 oz (82.7 kg)   SpO2 99%   BMI 33.33 kg/m   Body mass index is 33.33 kg/m.   Tobacco History  Smoking Status  . Former Smoker  . Quit date: 11/20/1982  Smokeless Tobacco  . Never Used    Comment: 15 pack year history     Counseling given: No   Past Medical History:  Diagnosis Date  . Allergy   . 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 Hulda Humphrey of vascular surgery in [place]  . Arthritis    rt knee, foot  . Fibromuscular dysplasia ([place])   . GERD (gastroesophageal reflux disease)   . Hyperlipidemia   . Hypertension    For 16 years; urinary catecholamines within normal limits 12/09; renal Doppler ultrasound showed no evidence for renal artery stenosis  . Normal echocardiogram 4/10   LVEF 65%; no regional wall motion abnormalities; normal RV size and function; pulmonic valve had increased gradient across w/ peak gradient of about 36 mmHg (range of moderate pulmonic stenosis) followup showed normal valve  . Papilloma of breast    right  . Tinnitus of left ear    Past Surgical History:  Procedure Laterality Date  . ANGIOPLASTY     2012  . BREAST BIOPSY Right 2006   benign  . BREAST LUMPECTOMY WITH RADIOACTIVE SEED LOCALIZATION Right 08/14/2017   Procedure: RIGHT BREAST LUMPECTOMY WITH RADIOACTIVE SEED LOCALIZATION;  Surgeon: [name], MD;  Location: [facility];  Service: General;  Laterality: Right;  . Cardiolyte   11/07   Neg  . KNEE CARTILAGE SURGERY     right knee  . TOTAL ABDOMINAL HYSTERECTOMY     no cervix   Family History  Problem Relation Age of Onset  . Prostate cancer Father   . Colon cancer Maternal Uncle   . Colon cancer Paternal Uncle   . Coronary artery disease Paternal Grandmother   . Goiter Other        ?  . Esophageal cancer Neg Hx   . Stomach cancer Neg Hx   . Rectal cancer Neg Hx    History  Sexual Activity  . Sexual activity: Not on file    Outpatient Encounter Prescriptions as of 09/04/2017  Medication Sig  . amLODipine (NORVASC) 5 MG tablet Take 5 mg by mouth daily.  Marland Kitchen aspirin EC 81 MG tablet Take 81 mg by mouth 2 (two) times a week.  . cholecalciferol (VITAMIN D) 1000 units tablet Take 2,000 Units by mouth daily.  . metoprolol succinate (TOPROL-XL) 100 MG 24 hr tablet TAKE 1 TABLET (100 MG TOTAL) BY MOUTH DAILY. TAKE WITH OR IMMEDIATELY FOLLOWING A MEAL.  . pantoprazole (PROTONIX) 40 MG tablet TAKE 1 TABLET EVERY DAY  . dicyclomine (BENTYL) 20 MG tablet Take 20 mg by mouth 4 (four) times daily -  with meals and at bedtime.  Marland Kitchen HYDROcodone-acetaminophen (NORCO/VICODIN) 5-325 MG tablet Take 1-2 tablets by mouth every 6 (  six) hours as needed for moderate pain. (Patient not taking: Reported on 09/04/2017)   No facility-administered encounter medications on file as of 09/04/2017.     Activities of Daily Living In your present state of health, do you have any difficulty performing the following activities: 09/04/2017 08/14/2017  Hearing? Y N  Comment pt notices hearing in left ear is decreasing -  Vision? N N  Difficulty concentrating or making decisions? N N  Walking or climbing stairs? Y N  Dressing or bathing? N N  Doing errands, shopping? N -  Preparing Food and eating ? N -  Using the Toilet? N -  In the past six months, have you accidently leaked urine? N -  Do you have problems with loss of bowel control? N -  Managing your Medications? N -  Managing your  Finances? N -  Housekeeping or managing your Housekeeping? N -  Some recent data might be hidden    Patient Care Team: Jinny Sanders, MD as PCP - General    Assessment:     Hearing Screening   125Hz  250Hz  500Hz  1000Hz  2000Hz  3000Hz  4000Hz  6000Hz  8000Hz   Right ear:   40 0 0  0    Left ear:   0 0 0  0    Vision Screening Comments: Last vision exam in Winter 2017   Exercise Activities and Dietary recommendations Current Exercise Habits: Home exercise routine, Type of exercise: walking, Frequency (Times/Week): 7, Intensity: Mild, Exercise limited by: None identified  Goals    . Increase physical activity          Starting 09/04/2017, I will continue to walk at least 5000 steps daily.       Fall Risk Fall Risk  09/04/2017 06/01/2016 05/20/2015 01/09/2014  Falls in the past year? No No No No   Depression Screen PHQ 2/9 Scores 09/04/2017 06/01/2016 05/20/2015 01/09/2014  PHQ - 2 Score 0 0 0 0  PHQ- 9 Score 2 - - -     Cognitive Function MMSE - Mini Mental State Exam 09/04/2017  Orientation to time 5  Orientation to Place 5  Registration 3  Attention/ Calculation 0  Recall 3  Language- name 2 objects 0  Language- repeat 1  Language- follow 3 step command 3  Language- read & follow direction 0  Write a sentence 0  Copy design 0  Total score 20       PLEASE NOTE: A Mini-Cog screen was completed. Maximum score is 20. A value of 0 denotes this part of [place] MMSE was not completed or the patient failed this part of the Mini-Cog screening.   Mini-Cog Screening Orientation to Time - Max 5 pts Orientation to Place - Max 5 pts Registration - Max 3 pts Recall - Max 3 pts Language Repeat - Max 1 pts Language Follow 3 Step Command - Max 3 pts   Immunization History  Administered Date(s) Administered  . Influenza Split 10/03/2011, 08/30/2012  . Influenza Whole 08/28/2007, 08/19/2008, 08/12/2009, 09/14/2010  . Influenza,inj,Quad PF,6+ Mos 08/13/2013, 08/18/2014,  09/03/2015, 08/03/2016, 09/04/2017  . Pneumococcal Conjugate-13 01/09/2014  . Pneumococcal Polysaccharide-23 04/20/2006, 09/04/2017  . Td 04/20/2006   Screening Tests Health Maintenance  Topic Date Due  . TETANUS/TDAP  04/19/2026 (Originally 04/20/2016)  . COLONOSCOPY  10/23/2018  . MAMMOGRAM  06/16/2019  . INFLUENZA VACCINE  Completed  . DEXA SCAN  Completed  . PNA vac Low Risk Adult  Completed      Plan:   I have  personally reviewed and addressed the Medicare Annual Wellness questionnaire and have noted the following in the patient's chart:  A. Medical and social history B. Use of alcohol, tobacco or illicit drugs  C. Current medications and supplements D. Functional ability and status E.  Nutritional status F.  Physical activity G. Advance directives H. List of other physicians I.  Hospitalizations, surgeries, and ER visits in previous 12 months J.  [place] to include hearing, vision, cognitive, depression L. Referrals and appointments - none  In addition, I have reviewed and discussed with patient certain preventive protocols, quality metrics, and best practice recommendations. A written personalized care plan for preventive services as well as general preventive health recommendations were provided to patient.  See attached scanned questionnaire for additional information.   Signed,   Lindell Noe, MHA, BS, LPN Health Coach

## 2017-09-04 NOTE — Progress Notes (Signed)
PCP notes:   Health maintenance:  Tetanus - postponed/insurance Flu vaccine - administered PPSV23 - administered  Abnormal screenings:   Depression score: 2 Hearing- failed  Hearing Screening   125Hz  250Hz  500Hz  1000Hz  2000Hz  3000Hz  4000Hz  6000Hz  8000Hz   Right ear:   40 0 0  0    Left ear:   0 0 0  0      Patient concerns:   Patient verbalized concerns about chronic right leg swelling and high blood pressure.   Nurse concerns:  None  Next PCP appt:   09/06/17 @ 1115

## 2017-09-04 NOTE — Patient Instructions (Signed)
Kristy [name] , Thank you for taking time to come for your Medicare Wellness Visit. I appreciate your ongoing commitment to your health goals. Please review the following plan we discussed and let me know if I can assist you in the future.   These are the goals we discussed: Goals    . Increase physical activity          Starting 09/04/2017, I will continue to walk at least 5000 steps daily.        This is a list of the screening recommended for you and due dates:  Health Maintenance  Topic Date Due  . Tetanus Vaccine  04/19/2026*  . Colon Cancer Screening  10/23/2018  . Mammogram  06/16/2019  . Flu Shot  Completed  . DEXA scan (bone density measurement)  Completed  . Pneumonia vaccines  Completed  *Topic was postponed. The date shown is not the original due date.   Preventive Care for Adults  A healthy lifestyle and preventive care can promote health and wellness. Preventive health guidelines for adults include the following key practices.  . A routine yearly physical is a good way to check with your health care provider about your health and preventive screening. It is a chance to share any concerns and updates on your health and to receive a thorough exam.  . Visit your dentist for a routine exam and preventive care every 6 months. Brush your teeth twice a day and floss once a day. Good oral hygiene prevents tooth decay and gum disease.  . The frequency of eye exams is based on your age, health, family medical history, use  of contact lenses, and other factors. Follow your health care provider's ecommendations for frequency of eye exams.  . Eat a healthy diet. Foods like vegetables, fruits, whole grains, low-fat dairy products, and lean protein foods contain the nutrients you need without too many calories. Decrease your intake of foods high in solid fats, added sugars, and salt. Eat the right amount of calories for you. Get information about a proper diet from your health care  provider, if necessary.  . Regular physical exercise is one of the most important things you can do for your health. Most adults should get at least 150 minutes of moderate-intensity exercise (any activity that increases your heart rate and causes you to sweat) each week. In addition, most adults need muscle-strengthening exercises on 2 or more days a week.  Silver Sneakers may be a benefit available to you. To determine eligibility, you may visit the website: www.silversneakers.com or contact program at [phone]) [phone] Mon-Fri between 8AM-8PM.   . Maintain a healthy weight. The body mass index (BMI) is a screening tool to identify possible weight problems. It provides an estimate of body fat based on height and weight. Your health care provider can find your BMI and can help you achieve or maintain a healthy weight.   For adults 20 years and older: ? A BMI below 18.5 is considered underweight. ? A BMI of 18.5 to 24.9 is normal. ? A BMI of 25 to 29.9 is considered overweight. ? A BMI of 30 and above is considered obese.   . Maintain normal blood lipids and cholesterol levels by exercising and minimizing your intake of saturated fat. Eat a balanced diet with plenty of fruit and vegetables. Blood tests for lipids and cholesterol should begin at age 42 and be repeated every 5 years. If your lipid or cholesterol levels are high, you are over 50,  or you are at high risk for heart disease, you may need your cholesterol levels checked more frequently. Ongoing high lipid and cholesterol levels should be treated with medicines if diet and exercise are not working.  . If you smoke, find out from your health care provider how to quit. If you do not use tobacco, please do not start.  . If you choose to drink alcohol, please do not consume more than 2 drinks per day. One drink is considered to be 12 ounces (355 mL) of beer, 5 ounces (148 mL) of wine, or 1.5 ounces (44 mL) of liquor.  . If you are 29-79 years  old, ask your health care provider if you should take aspirin to prevent strokes.  . Use sunscreen. Apply sunscreen liberally and repeatedly throughout the day. You should seek shade when your shadow is shorter than you. Protect yourself by wearing long sleeves, pants, a wide-brimmed hat, and sunglasses year round, whenever you are outdoors.  . Once a month, do a whole body skin exam, using a mirror to look at the skin on your back. Tell your health care provider of new moles, moles that have irregular borders, moles that are larger than a pencil eraser, or moles that have changed in shape or color.

## 2017-09-04 NOTE — Progress Notes (Signed)
Pre visit review using our clinic review tool, if applicable. No additional management support is needed unless otherwise documented below in the visit note. 

## 2017-09-06 ENCOUNTER — Encounter: Payer: Self-pay | Admitting: Family Medicine

## 2017-09-06 ENCOUNTER — Ambulatory Visit (INDEPENDENT_AMBULATORY_CARE_PROVIDER_SITE_OTHER): Payer: Medicare Other | Admitting: Family Medicine

## 2017-09-06 VITALS — BP 138/76 | HR 67 | Temp 98.5°F | Ht 62.0 in | Wt 181.5 lb

## 2017-09-06 DIAGNOSIS — I773 Arterial fibromuscular dysplasia: Secondary | ICD-10-CM

## 2017-09-06 DIAGNOSIS — I10 Essential (primary) hypertension: Secondary | ICD-10-CM | POA: Diagnosis not present

## 2017-09-06 DIAGNOSIS — R7303 Prediabetes: Secondary | ICD-10-CM | POA: Diagnosis not present

## 2017-09-06 DIAGNOSIS — I701 Atherosclerosis of renal artery: Secondary | ICD-10-CM

## 2017-09-06 DIAGNOSIS — E0590 Thyrotoxicosis, unspecified without thyrotoxic crisis or storm: Secondary | ICD-10-CM | POA: Diagnosis not present

## 2017-09-06 DIAGNOSIS — I7789 Other specified disorders of arteries and arterioles: Secondary | ICD-10-CM | POA: Diagnosis not present

## 2017-09-06 DIAGNOSIS — Z0000 Encounter for general adult medical examination without abnormal findings: Secondary | ICD-10-CM

## 2017-09-06 DIAGNOSIS — E785 Hyperlipidemia, unspecified: Secondary | ICD-10-CM

## 2017-09-06 NOTE — Assessment & Plan Note (Signed)
Stable control. 

## 2017-09-06 NOTE — Assessment & Plan Note (Signed)
Followed by vascular. 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  Followed now also by nephrology.

## 2017-09-06 NOTE — Progress Notes (Signed)
Subjective:    Patient ID: Kristy [name], female    DOB: 1941-03-01, 76 y.o.   MRN: [id number]  HPI  The patient presents for  complete physical and review of chronic health problems. He/She also has the following acute concerns today:  The patient saw Kristy Musa, LPN for medicare wellness. Note reviewed in detail and important notes copied below.  Health maintenance: Tetanus - postponed/insurance Flu vaccine - administered PPSV23 - administered Abnormal screenings:  Depression score: 2 Hearing- failed             Hearing Screening   125Hz  250Hz  500Hz  1000Hz  2000Hz  3000Hz  4000Hz  6000Hz  8000Hz   Right ear:   40 0 0  0    Left ear:   0 0 0  0    Patient concerns:   Patient verbalized concerns about chronic right leg swelling and high blood pressure.   09/06/17 Today:  Hypertension:  Fluctuates.  On amlodipine and metoprolol. BP Readings from Last 3 Encounters:  09/06/17 138/76  09/04/17 (!) 160/90  08/14/17 136/69  Using medication without problems or lightheadedness:  Chest pain with exertion: none Edema: in right foot.Marland Kitchen History of knee surgery on right and ankle issues Short of breath: Average home BPs: Other issues:  Elevated Cholesterol:  Lab Results  Component Value Date   CHOL 222 (H) 09/04/2017   HDL 54.20 09/04/2017   LDLCALC 145 (H) 09/04/2017   LDLDIRECT 140.3 03/26/2013   TRIG 116.0 09/04/2017   CHOLHDL 4 09/04/2017  Using medications without problems: Muscle aches:  Diet compliance: poor, skips meals, snacking alot Exercise: walking Other complaints:  Vit D: restart vit D  Prediabetes: stable A1C 6   Thyroid: Stable levels.  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 [place] of carotids and renal.. Every 2 years.  Some change on kidney... followed by Dr. Holley Raring.   Social History /Family History/Past Medical History reviewed in detail and updated in EMR if needed. Blood pressure 138/76, pulse 67, temperature 98.5 F  (36.9 C), temperature source Oral, height 5\' 2"  (1.575 m), weight 181 lb 8 oz (82.3 kg).   Review of Systems  Constitutional: Negative for fatigue and fever.  HENT: Negative for congestion.   Eyes: Negative for pain.  Respiratory: Negative for cough and shortness of breath.   Cardiovascular: Positive for leg swelling. Negative for chest pain and palpitations.  Gastrointestinal: Negative for abdominal pain.  Genitourinary: Negative for dysuria and vaginal bleeding.  Musculoskeletal: Negative for back pain.  Neurological: Negative for syncope, light-headedness and headaches.  Psychiatric/Behavioral: Negative for dysphoric mood.       Objective:   Physical Exam  Constitutional: Vital signs are normal. She appears well-developed and well-nourished. She is cooperative.  Non-toxic appearance. She does not appear ill. No distress.  HENT:  Head: Normocephalic.  Right Ear: Hearing, tympanic membrane, external ear and ear canal normal.  Left Ear: Hearing, tympanic membrane, external ear and ear canal normal.  Nose: Nose normal.  Eyes: Pupils are equal, round, and reactive to light. Conjunctivae, EOM and lids are normal. Lids are everted and swept, no foreign bodies found.  Neck: Trachea normal and normal range of motion. Neck supple. Carotid bruit is not present. No thyroid mass and no thyromegaly present.  Cardiovascular: Normal rate, regular rhythm, S1 normal, S2 normal, normal heart sounds and intact distal pulses.  Exam reveals no gallop.   No murmur heard.  Right ankle 1 plus pitting edema  Pulmonary/Chest: Effort normal and breath sounds normal. No respiratory distress. She has  no wheezes. She has no rhonchi. She has no rales.  Abdominal: Soft. Normal appearance and bowel sounds are normal. She exhibits no distension, no fluid wave, no abdominal bruit and no mass. There is no hepatosplenomegaly. There is no tenderness. There is no rebound, no guarding and no CVA tenderness. No hernia.    Lymphadenopathy:    She has no cervical adenopathy.    She has no axillary adenopathy.  Neurological: She is alert. She has normal strength. No cranial nerve deficit or sensory deficit.  Skin: Skin is warm, dry and intact. No rash noted.  Psychiatric: Her speech is normal and behavior is normal. Judgment normal. Her mood appears not anxious. Cognition and memory are normal. She does not exhibit a depressed mood.          Assessment & Plan:  The patient's preventative maintenance and recommended screening tests for an annual wellness exam were reviewed in full today. Brought up to date unless services declined.  Counselled on the importance of diet, exercise, and its role in overall health and mortality. The patient's FH and SH was reviewed, including their home life, tobacco status, and drug and alcohol status.   Vaccines: updated flu and PNA at [facility] Colon:10/2013, Dr. Fuller Plan, Repeat in 5 years. DVE/pap: total hysterectomy  Mammo: 08/14/2017 bx.. 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  She is now sexually active. Not interested in STD testing.

## 2017-09-06 NOTE — Assessment & Plan Note (Signed)
Encouraged exercise, weight loss, healthy eating habits. ? ?

## 2017-09-06 NOTE — Patient Instructions (Signed)
Restart vit D OTC.  Work on increasing exercise and decrease animal fats in diet.

## 2017-09-06 NOTE — Assessment & Plan Note (Signed)
Work on [employer], weight loss and increase exercise.

## 2017-09-13 ENCOUNTER — Other Ambulatory Visit: Payer: Self-pay | Admitting: Family Medicine

## 2017-10-04 ENCOUNTER — Ambulatory Visit: Payer: Self-pay | Admitting: *Deleted

## 2017-10-04 NOTE — Telephone Encounter (Signed)
Pt  Has  Been  Dizzy   Worse  Today    With  Some  Ringing  In the  r   Ear     bp  Was   190/80  This am  She  Is  Speaking in  Complete   sentances   And is  Awake  And   Alert  And  She   Has  Not  Passed  Out    Reason for Disposition . [1] MODERATE dizziness (e.g., interferes with normal activities) AND [2] has NOT been evaluated by physician for this  (Exception: dizziness caused by heat exposure, sudden standing, or poor fluid intake)  Answer Assessment - Initial Assessment Questions 1. DESCRIPTION: "Describe your dizziness."      Lightheaded      When  Bends     2. LIGHTHEADED: "Do you feel lightheaded?" (e.g., somewhat faint, woozy, weak upon standing)     Yes    Woozy    Does not  Feel   Like   She    Is  Going  To  Pass  Out   3. VERTIGO: "Do you feel like either you or the room is spinning or tilting?" (i.e. vertigo)     No   4. SEVERITY: "How bad is it?"  "Do you feel like you are going to faint?" "Can you stand and walk?"   - MILD - walking normally   - MODERATE - interferes with normal activities (e.g., work, school)    - SEVERE - unable to stand, requires support to walk, feels like passing out now.      mILD 5. ONSET:  "When did the dizziness begin?"       THIS  AM 6. AGGRAVATING FACTORS: "Does anything make it worse?" (e.g., standing, change in head position)     Certain  posistions  Make  It  Worse  7. HEART RATE: "Can you tell me your heart rate?" "How many beats in 15 seconds?"  (Note: not all patients can do this)        Rest hr  60   8. CAUSE: "What do you think is causing the dizziness?"      Unknown  9. RECURRENT SYMPTOM: "Have you had dizziness before?" If so, ask: "When was the last time?" "What happened that time?"     Has   Had  dizzyness  In past  Last  Year   10. OTHER SYMPTOMS: "Do you have any other symptoms?" (e.g., fever, chest pain, vomiting, diarrhea, bleeding)      none 11. PREGNANCY: "Is there any chance you are pregnant?" "When was your last  menstrual period?"      Hysterectomy  Protocols used: DIZZINESS [facility]

## 2017-10-05 ENCOUNTER — Ambulatory Visit: Payer: Medicare Other | Admitting: Physician Assistant

## 2017-10-05 ENCOUNTER — Other Ambulatory Visit: Payer: Self-pay

## 2017-10-05 ENCOUNTER — Encounter: Payer: Self-pay | Admitting: Physician Assistant

## 2017-10-05 VITALS — BP 184/93 | HR 63 | Temp 97.9°F | Resp 16 | Ht 62.0 in | Wt 179.2 lb

## 2017-10-05 DIAGNOSIS — R42 Dizziness and giddiness: Secondary | ICD-10-CM

## 2017-10-05 DIAGNOSIS — H8110 Benign paroxysmal vertigo, unspecified ear: Secondary | ICD-10-CM

## 2017-10-05 DIAGNOSIS — R030 Elevated blood-pressure reading, without diagnosis of hypertension: Secondary | ICD-10-CM | POA: Diagnosis not present

## 2017-10-05 NOTE — Patient Instructions (Addendum)
It was a pleasure meeting you today. If your symptoms return, try the Epley Maneuver (below) or, as always, feel free to return to our clinic. (You Tube videos may be easier to follow than written instructions).  If your symptoms worsen please go directly to the emergency department.   How to Perform the Epley Maneuver The Epley maneuver is an exercise that relieves symptoms of vertigo. Vertigo is the feeling that you or your surroundings are moving when they are not. When you feel vertigo, you may feel like the room is spinning and have trouble walking. Dizziness is a little different than vertigo. When you are dizzy, you may feel unsteady or light-headed. You can do this maneuver at home whenever you have symptoms of vertigo. You can do it up to 3 times a day until your symptoms go away. Even though the Epley maneuver may relieve your vertigo for a few weeks, it is possible that your symptoms will return. This maneuver relieves vertigo, but it does not relieve dizziness. What are the risks? If it is done correctly, the Epley maneuver is considered safe. Sometimes it can lead to dizziness or nausea that goes away after a short time. If you develop other symptoms, such as changes in vision, weakness, or numbness, stop doing the maneuver and call your health care provider. How to perform the Epley maneuver 1. Sit on the edge of a bed or table with your back straight and your legs extended or hanging over the edge of the bed or table. 2. Turn your head halfway toward the affected ear or side. 3. Lie backward quickly with your head turned until you are lying flat on your back. You may want to position a pillow under your shoulders. 4. Hold this position for 30 seconds. You may experience an attack of vertigo. This is normal. 5. Turn your head to the opposite direction until your unaffected ear is facing the floor. 6. Hold this position for 30 seconds. You may experience an attack of vertigo. This is  normal. Hold this position until the vertigo stops. 7. Turn your whole body to the same side as your head. Hold for another 30 seconds. 8. Sit back up. You can repeat this exercise up to 3 times a day. Follow these instructions at home:  After doing the Epley maneuver, you can return to your normal activities.  Ask your health care provider if there is anything you should do at home to prevent vertigo. He or she may recommend that you: ? Keep your head raised (elevated) with two or more pillows while you sleep. ? Do not sleep on the side of your affected ear. ? Get up slowly from bed. ? Avoid sudden movements during the day. ? Avoid extreme head movement, like looking up or bending over. Contact a health care provider if:  Your vertigo gets worse.  You have other symptoms, including: ? Nausea. ? Vomiting. ? Headache. Get help right away if:  You have vision changes.  You have a severe or worsening headache or neck pain.  You cannot stop vomiting.  You have new numbness or weakness in any part of your body. Summary  Vertigo is the feeling that you or your surroundings are moving when they are not.  The Epley maneuver is an exercise that relieves symptoms of vertigo.  If the Epley maneuver is done correctly, it is considered safe. You can do it up to 3 times a day. This information is not intended to  replace advice given to you by your health care provider. Make sure you discuss any questions you have with your health care provider. Document Released: 11/11/2013 Document Revised: 09/26/2016 Document Reviewed: 09/26/2016 Elsevier Interactive Patient Education  2017 [place] you for coming in today. I hope you feel we met your needs.  Feel free to call PCP if you have any questions or further requests.  Please consider signing up for MyChart if you do not already have it, as this is a great way to communicate with me.  Best,  Whitney McVey, PA-C  IF you  received an x-ray today, you will receive an invoice from [facility]. Please contact [facility] at [phone] with questions or concerns regarding your invoice.   IF you received labwork today, you will receive an invoice from [facility]. Please contact [facility] at [phone] with questions or concerns regarding your invoice.   Our billing staff will not be able to assist you with questions regarding bills from these companies.  You will be contacted with the lab results as soon as they are available. The fastest way to get your results is to activate your My Chart account. Instructions are located on the last page of this paperwork. If you have not heard from [place] regarding the results in 2 weeks, please contact this office.

## 2017-10-05 NOTE — Progress Notes (Signed)
Kristy [name]  MRN: [id number] DOB: 04-23-1941  PCP: Jinny Sanders, MD  Subjective:  Pt is a pleasant 76 year old female PMH HTN, fibromuscular dysplasia, RAS, GERD, IBS, HLD, hyperthyroidism, BPPV presents to clinic for dizziness. Onset was yesterday morning. She woke from sleep and while laying in bed she turned her head and felt the room start spinning. She stood up to walk to the bathroom and symptoms continued. Symptoms lasted about a minute. She was afraid to move her head too quickly yesterday, so she has been turning her whole body or squatting down to move. She is asymptomatic at this time, feeling much better today.  Denies n/v, visual changes, headache, chest pain, palpitations, weakness, n/t, light-headedness, difficulty with speech, facial drooping.     She has been checking blood pressures at home: 120's/80's. Endorses white coat syndromes.  H/o  fibromuscular dysplasia - She has appt with nephrology in 2 weeks. Vein specialist found calcifications on renal arteries at her last f/u OV for fibromuscular dysplasia and referred her to nephrology.   Review of Systems  Constitutional: Negative for chills, diaphoresis, fatigue and fever.  Eyes: Negative for visual disturbance.  Respiratory: Negative for cough.   Cardiovascular: Negative for chest pain and palpitations.  Gastrointestinal: Negative for abdominal pain, nausea and vomiting.  Musculoskeletal: Negative for gait problem, neck pain and neck stiffness.  Neurological: Positive for dizziness. Negative for syncope, speech difficulty, weakness, light-headedness, numbness and headaches.  Psychiatric/Behavioral: Negative for sleep disturbance. The patient is not nervous/anxious.     Patient Active Problem List   Diagnosis Date Noted  . 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 Chronic insomnia 06/01/2016  . BPPV (benign paroxysmal positional vertigo) 12/17/2015  . Counseling  regarding end of life decision making 05/20/2015  . Hot flashes 12/08/2014  . 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 Allergic rhinitis 10/04/2012  . Osteoporosis 09/14/2010  . Prediabetes 09/14/2010  . ONYCHOMYCOSIS 08/12/2009  . HYPERTENSION, BENIGN ESSENTIAL, LABILE 02/15/2009  . LOW BACK PAIN, CHRONIC 02/01/2009  . Fibromuscular dysplasia ([place]) 08/27/2008  . Hyperthyroidism 08/28/2007  . Hyperlipidemia 08/28/2007  . 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 GERD 08/28/2007  . IRRITABLE BOWEL SYNDROME 08/28/2007    Current Outpatient Medications on File Prior to Visit  Medication Sig Dispense Refill  . amLODipine (NORVASC) 5 MG tablet Take 5 mg by mouth daily.    Marland Kitchen aspirin EC 81 MG tablet Take 81 mg by mouth 2 (two) times a week.    . cholecalciferol (VITAMIN D) 1000 units tablet Take 2,000 Units by mouth daily.    . metoprolol succinate (TOPROL-XL) 100 MG 24 hr tablet TAKE 1 TABLET BY MOUTH DAILY WITH OR IMMEDIATELY FOLLOWING A MEAL. 90 tablet 1  . pantoprazole (PROTONIX) 40 MG tablet TAKE 1 TABLET EVERY DAY 30 tablet 2  . dicyclomine (BENTYL) 20 MG tablet Take 20 mg by mouth 4 (four) times daily -  with meals and at bedtime.     No current facility-administered medications on file prior to visit.     No Known Allergies   Objective:  BP (!) 184/93   Pulse 63   Temp 97.9 F (36.6 C) (Oral)   Resp 16   Ht 5\' 2"  (1.575 m)   Wt 179 lb 3.2 oz (81.3 kg)   SpO2 98%   BMI 32.78 kg/m   Orthostatic VS for the past 24 hrs:  BP- Lying Pulse- Lying BP-  Sitting Pulse- Sitting BP- Standing at 0 minutes Pulse- Standing at 0 minutes  10/05/17 1103 (!) 176/95 63 (!) 184/93 59 (!) 186/98 64   Physical Exam  Constitutional: She is oriented to person, place, and time and well-developed, well-nourished, and in no distress. No distress.  HENT:  Right Ear: Tympanic membrane normal.  Left Ear: Tympanic membrane normal.  Eyes: EOM are normal.  Pupils are equal, round, and reactive to light.  Cardiovascular: Normal rate, regular rhythm, S1 normal, S2 normal and normal heart sounds.  Neurological: She is alert and oriented to person, place, and time. She has normal motor skills, normal sensation, normal strength, normal reflexes and intact cranial nerves. GCS score is 15.  Negative Dix Hallpike   Skin: Skin is warm and dry.  Psychiatric: Mood, memory, affect and judgment normal.  Vitals reviewed.   Assessment and Plan :  1. Benign paroxysmal positional vertigo, unspecified laterality 2. Dizziness - Orthostatic vital signs - Pr c/o dizziness x 1 day, worse with changes of head position. Negative orthostatics, HPI not suggestive of cardiac origin. Suspect BPPV, despite negative Dix Hallpike maneuver. Epley maneuver discussed.  RTC if symptoms worsen.  3. Elevated blood pressure reading - Recheck vitals - Pt checks home blood pressures, which are wnl.   Mercer Pod, PA-C  [facility] 10/05/2017 11:07 AM

## 2017-10-18 DIAGNOSIS — R3129 Other microscopic hematuria: Secondary | ICD-10-CM | POA: Diagnosis not present

## 2017-10-18 DIAGNOSIS — I701 Atherosclerosis of renal artery: Secondary | ICD-10-CM | POA: Diagnosis not present

## 2017-10-18 DIAGNOSIS — I10 Essential (primary) hypertension: Secondary | ICD-10-CM | POA: Diagnosis not present

## 2017-11-22 ENCOUNTER — Ambulatory Visit (INDEPENDENT_AMBULATORY_CARE_PROVIDER_SITE_OTHER): Payer: Medicare Other | Admitting: Urology

## 2017-11-22 ENCOUNTER — Encounter: Payer: Self-pay | Admitting: Urology

## 2017-11-22 VITALS — BP 153/86 | HR 80 | Ht 62.0 in | Wt 179.9 lb

## 2017-11-22 DIAGNOSIS — R3129 Other microscopic hematuria: Secondary | ICD-10-CM

## 2017-11-22 LAB — URINALYSIS, COMPLETE
Bilirubin, UA: NEGATIVE
Glucose, UA: NEGATIVE
Ketones, UA: NEGATIVE
Leukocytes, UA: NEGATIVE
Nitrite, UA: NEGATIVE
Protein, UA: NEGATIVE
Specific Gravity, UA: 1.02 (ref 1.005–1.030)
Urobilinogen, Ur: 0.2 mg/dL (ref 0.2–1.0)
pH, UA: 6 (ref 5.0–7.5)

## 2017-11-22 LAB — MICROSCOPIC EXAMINATION: WBC, UA: NONE SEEN /hpf (ref 0–?)

## 2017-11-22 NOTE — Progress Notes (Signed)
11/22/2017 2:54 PM   Kristy [name] 07/06/1941 [id number]  Referring provider: Jinny Sanders, MD [address] [place], [place] [address]  Chief Complaint  Patient presents with  . 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  This was seen in her nephrologist office.  It is also present on microscopic evaluation today.  Patient notes that she has had microscopic hematuria in her urine for over 40 years.  She has never had gross hematuria.  She has undergone 2 negative hematuria workup with a CT and cystoscopy.  Once was in her 74s.  The last one was most recently 6 years ago.  She notes no changes in her urinary status since that time.  No dysuria.  Again she has never seen gross hematuria.  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  She smoked but quit 40 years ago.  Of note her baseline creatinine is 0.67.   PMH: Past Medical History:  Diagnosis Date  . Allergy   . 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 Hulda Humphrey of vascular surgery in [place]  . Arthritis    rt knee, foot  . Fibromuscular dysplasia ([place])   . GERD (gastroesophageal reflux disease)   . Hyperlipidemia   . Hypertension    For 16 years; urinary catecholamines within normal limits 12/09; renal Doppler ultrasound showed no evidence for renal artery stenosis  . Normal echocardiogram 4/10   LVEF 65%; no regional wall motion abnormalities; normal RV size and function; pulmonic valve had increased gradient across w/ peak gradient of about 36 mmHg (range of moderate pulmonic stenosis) followup showed normal valve  . Papilloma of breast    right  . Tinnitus of left ear     Surgical History: Past Surgical History:  Procedure Laterality Date  . ANGIOPLASTY     2012  . BREAST BIOPSY Right  2006   benign  . BREAST LUMPECTOMY WITH RADIOACTIVE SEED LOCALIZATION Right 08/14/2017   Procedure: RIGHT BREAST LUMPECTOMY WITH RADIOACTIVE SEED LOCALIZATION;  Surgeon: [name], MD;  Location: [facility];  Service: General;  Laterality: Right;  . Cardiolyte  11/07   Neg  . KNEE CARTILAGE SURGERY     right knee  . TOTAL ABDOMINAL HYSTERECTOMY     no cervix    Home Medications:  Allergies as of 11/22/2017   No Known Allergies     Medication List        Accurate as of 11/22/17  2:54 PM. Always use your most recent med list.          aspirin EC 81 MG tablet Take 81 mg by mouth 2 (two) times a week.   chlorthalidone 25 MG tablet Commonly known as:  HYGROTON   metoprolol succinate 100 MG 24 hr tablet Commonly known as:  TOPROL-XL TAKE 1 TABLET BY MOUTH DAILY WITH OR IMMEDIATELY FOLLOWING A MEAL.   multivitamin tablet Take 1 tablet by mouth daily.   pantoprazole 40 MG tablet Commonly known as:  PROTONIX TAKE 1 TABLET EVERY DAY       Allergies: No Known Allergies  Family History: Family History  Problem Relation Age of Onset  . Prostate cancer Father   . Colon cancer Maternal Uncle   . Colon cancer Paternal Uncle   . Coronary artery disease Paternal Grandmother   .  Goiter Other        ?  . Esophageal cancer Neg Hx   . Stomach cancer Neg Hx   . Rectal cancer Neg Hx   . Bladder Cancer Neg Hx   . Kidney cancer Neg Hx     Social History:  reports that she quit smoking about 35 years ago. she has never used smokeless tobacco. She reports that she does not drink alcohol or use drugs.  ROS: UROLOGY Frequent Urination?: No Hard to postpone urination?: No Burning/pain with urination?: No Get up at night to urinate?: Yes Leakage of urine?: No Urine stream starts and stops?: No Trouble starting stream?: No Do you have to strain to urinate?: No Blood in urine?: Yes Urinary tract infection?: No Sexually transmitted disease?: No Injury to  kidneys or bladder?: No Painful intercourse?: No Weak stream?: No Currently pregnant?: No Vaginal bleeding?: No Last menstrual period?: n  Gastrointestinal Nausea?: No Vomiting?: No Indigestion/heartburn?: Yes Diarrhea?: No Constipation?: No  Constitutional Fever: No Night sweats?: No Weight loss?: No Fatigue?: No  Skin Skin rash/lesions?: No Itching?: No  Eyes Blurred vision?: No Double vision?: No  Ears/Nose/Throat Sore throat?: No Sinus problems?: No  Hematologic/Lymphatic Swollen glands?: No Easy bruising?: No  Cardiovascular Leg swelling?: No Chest pain?: No  Respiratory Cough?: No Shortness of breath?: No  Endocrine Excessive thirst?: No  Musculoskeletal Back pain?: No Joint pain?: Yes  Neurological Headaches?: No Dizziness?: No  Psychologic Depression?: No Anxiety?: No  Physical Exam: BP (!) 153/86 (BP Location: Right Arm, Patient Position: Sitting, Cuff Size: Normal)   Pulse 80   Ht 5\' 2"  (1.575 m)   Wt 179 lb 14.4 oz (81.6 kg)   BMI 32.90 kg/m   Constitutional:  Alert and oriented, No acute distress. HEENT: [place] AT, moist mucus membranes.  Trachea midline, no masses. Cardiovascular: No clubbing, cyanosis, or edema. Respiratory: Normal respiratory effort, no increased work of breathing. GI: Abdomen is soft, nontender, nondistended, no abdominal masses GU: No CVA tenderness.  Skin: No rashes, bruises or suspicious lesions. Lymph: No cervical or inguinal adenopathy. Neurologic: Grossly intact, no focal deficits, moving all 4 extremities. Psychiatric: Normal mood and affect.  Laboratory Data: Lab Results  Component Value Date   WBC 4.9 03/02/2017   HGB 14.4 03/02/2017   HCT 42.2 03/02/2017   MCV 95.0 03/02/2017   PLT 325 03/02/2017    Lab Results  Component Value Date   CREATININE 0.67 09/04/2017    No results found for: PSA  No results found for: TESTOSTERONE  Lab Results  Component Value Date   HGBA1C 6.0  09/04/2017    Urinalysis    Component Value Date/Time   COLORURINE STRAW (A) 03/02/2017 1320   APPEARANCEUR CLEAR 03/02/2017 1320   LABSPEC 1.004 (L) 03/02/2017 1320   PHURINE 5.0 03/02/2017 1320   GLUCOSEU NEGATIVE 03/02/2017 1320   HGBUR MODERATE (A) 03/02/2017 1320   HGBUR large 11/03/2010 1548   BILIRUBINUR NEGATIVE 03/02/2017 1320   BILIRUBINUR neg 11/21/2016 1151   KETONESUR NEGATIVE 03/02/2017 1320   PROTEINUR NEGATIVE 03/02/2017 1320   UROBILINOGEN negative 11/21/2016 1151   UROBILINOGEN 0.2 06/10/2011 1040   NITRITE NEGATIVE 03/02/2017 1320   LEUKOCYTESUR NEGATIVE 03/02/2017 1320    Assessment & Plan:    1. Microscopic hematuria The patient has a greater than 40-year history of microscopic hematuria with 2- workups with the most recently being in 6 years ago.  We discussed options which would be to repeat CT urogram followed by office cystoscopy versus  no further workup.  Given her long-standing history and lack of symptoms, she is elected to proceed with no further workup at this time.  She will return to our office for repeat hematuria if she ever notices gross hematuria.   Return if symptoms worsen or fail to improve.  Nickie Retort, MD  [facility] [address], [place], [place] [address] [phone]

## 2017-11-30 ENCOUNTER — Ambulatory Visit: Payer: Medicare Other | Admitting: Family Medicine

## 2018-01-08 ENCOUNTER — Ambulatory Visit: Payer: Medicare Other | Admitting: Podiatry

## 2018-01-14 ENCOUNTER — Other Ambulatory Visit: Payer: Self-pay | Admitting: Family Medicine

## 2018-01-15 ENCOUNTER — Ambulatory Visit: Payer: Medicare Other | Admitting: Podiatry

## 2018-01-15 ENCOUNTER — Encounter: Payer: Self-pay | Admitting: Podiatry

## 2018-01-15 ENCOUNTER — Ambulatory Visit (INDEPENDENT_AMBULATORY_CARE_PROVIDER_SITE_OTHER): Payer: Medicare Other

## 2018-01-15 DIAGNOSIS — M7660 Achilles tendinitis, unspecified leg: Secondary | ICD-10-CM

## 2018-01-15 NOTE — Progress Notes (Signed)
   Subjective:    Patient ID: Kristy [name], female    DOB: [birth year], 77 y.o.   MRN: [id number]  HPI    Review of Systems  HENT: Positive for tinnitus.   Cardiovascular: Positive for leg swelling.       Objective:   Physical Exam        Assessment & Plan:

## 2018-01-17 NOTE — Progress Notes (Signed)
   HPI: 77 year old female presenting as a new patient with a chief complaint of intermittent pain to the right achilles area that has been ongoing for the past two years. She reports having an MRI done in 2016 and was told there was a tear in the achilles tendon but it should heal on its own. She reports having right knee surgery two years ago and reports RLE swelling since. She has not done anything to treat the symptoms. Walking and bearing weight increases the pain. Patient is here for further evaluation and treatment.   Past Medical History:  Diagnosis Date  . Allergy   . 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 Hulda Humphrey of vascular surgery in [place]  . Arthritis    rt knee, foot  . Fibromuscular dysplasia ([place])   . GERD (gastroesophageal reflux disease)   . Hyperlipidemia   . Hypertension    For 16 years; urinary catecholamines within normal limits 12/09; renal Doppler ultrasound showed no evidence for renal artery stenosis  . Normal echocardiogram 4/10   LVEF 65%; no regional wall motion abnormalities; normal RV size and function; pulmonic valve had increased gradient across w/ peak gradient of about 36 mmHg (range of moderate pulmonic stenosis) followup showed normal valve  . Papilloma of breast    right  . Tinnitus of left ear       Physical Exam: General: The patient is alert and oriented x3 in no acute distress.  Dermatology: Skin is warm, dry and supple bilateral lower extremities. Negative for open lesions or macerations.  Vascular: Palpable pedal pulses bilaterally. No edema or erythema noted. Capillary refill within normal limits.  Neurological: Epicritic and protective threshold grossly intact bilaterally.   Musculoskeletal Exam: Pain on palpation noted to the posterior tubercle of the right calcaneus at the insertion of the Achilles tendon consistent with retrocalcaneal bursitis. Range of motion within  normal limits. Muscle strength 5/5 in all muscle groups bilateral lower extremities.  Radiographic Exam:  Posterior calcaneal spur noted to the respective calcaneus on lateral view. No fracture or dislocation noted. Normal osseous mineralization noted.     Assessment: 1. Insertional Achilles tendinitis right - chronic 2. Retrocalcaneal bursitis   Plan of Care:  1. Patient was evaluated. Radiographs were reviewed today. 2. Patient cannot tolerate oral NSAIDs.  3. Orders placed today for physical therapy three times weekly for 4 weeks.  4. Return to clinic in 4 weeks.    Edrick Kins, DPM [facility]  Dr. Edrick Kins, [address]                                        [place], [place] [address]                Office [phone]  Fax [phone]

## 2018-01-31 ENCOUNTER — Ambulatory Visit (INDEPENDENT_AMBULATORY_CARE_PROVIDER_SITE_OTHER): Payer: Medicare Other | Admitting: Vascular Surgery

## 2018-01-31 ENCOUNTER — Encounter (INDEPENDENT_AMBULATORY_CARE_PROVIDER_SITE_OTHER): Payer: Medicare Other

## 2018-02-12 ENCOUNTER — Ambulatory Visit: Payer: Medicare Other | Admitting: Podiatry

## 2018-02-20 ENCOUNTER — Ambulatory Visit (INDEPENDENT_AMBULATORY_CARE_PROVIDER_SITE_OTHER): Payer: Medicare Other | Admitting: Vascular Surgery

## 2018-02-20 ENCOUNTER — Encounter (INDEPENDENT_AMBULATORY_CARE_PROVIDER_SITE_OTHER): Payer: Self-pay | Admitting: Vascular Surgery

## 2018-02-20 ENCOUNTER — Ambulatory Visit (INDEPENDENT_AMBULATORY_CARE_PROVIDER_SITE_OTHER): Payer: Medicare Other

## 2018-02-20 VITALS — BP 138/87 | HR 70 | Resp 16 | Ht 62.0 in | Wt 178.6 lb

## 2018-02-20 DIAGNOSIS — I6523 Occlusion and stenosis of bilateral carotid arteries: Secondary | ICD-10-CM

## 2018-02-20 DIAGNOSIS — I773 Arterial fibromuscular dysplasia: Secondary | ICD-10-CM | POA: Diagnosis not present

## 2018-02-20 DIAGNOSIS — I701 Atherosclerosis of renal artery: Secondary | ICD-10-CM | POA: Diagnosis not present

## 2018-02-20 DIAGNOSIS — I6529 Occlusion and stenosis of unspecified carotid artery: Secondary | ICD-10-CM | POA: Insufficient documentation

## 2018-02-20 NOTE — Progress Notes (Signed)
Subjective:    Patient ID: [name], female    DOB: 14-Mar-1941, 77 y.o.   MRN: [id number] Chief Complaint  Patient presents with  . Follow-up    ref [place] for earlier Renal ultrasound   Patient presents for a yearly renal artery stenosis follow-up.  The patient has a past medical history of FML.  The patient denies any issues in regard to controlling her hypertension or changes in her kidney function.  The patient underwent a bilateral renal ultrasound which was notable for 1-60% stenosis of the distal renal artery.  No hemodynamically significant stenosis noted in the left renal artery.  Normal bilateral kidney length measurements.  When compared to the previous study on January 29, 2017 there has been no significant change.  The patient has not had her carotid arteries ultrasound in over a year.  The patient denies experiencing Amaurosis Fugax, TIA like symptoms or focal motor deficits.  The patient does experience intermittent dizziness.  The patient denies any fever, nausea vomiting.  Review of Systems  Constitutional: Negative.   HENT: Negative.   Eyes: Negative.   Respiratory: Negative.   Cardiovascular:       Carotid artery stenosis Renal artery stenosis FML  Gastrointestinal: Negative.   Endocrine: Negative.   Genitourinary: Negative.   Musculoskeletal: Negative.   Skin: Negative.   Allergic/Immunologic: Negative.   Neurological: Negative.   Hematological: Negative.   Psychiatric/Behavioral: Negative.       Objective:   Physical Exam  Constitutional: She is oriented to person, place, and time. She appears well-developed and well-nourished. No distress.  HENT:  Head: Normocephalic and atraumatic.  Eyes: Pupils are equal, round, and reactive to light.  Neck: Normal range of motion.  No carotid bruits noted at this time  Pulmonary/Chest: Effort normal and breath sounds normal.  Abdominal: Soft. Bowel sounds are normal.  Musculoskeletal: Normal range of motion.  She exhibits no edema.  Neurological: She is alert and oriented to person, place, and time.  Skin: Skin is warm and dry. She is not diaphoretic.  Psychiatric: She has a normal mood and affect. Her behavior is normal. Judgment and thought content normal.  Vitals reviewed.  BP 138/87 (BP Location: Right Arm)   Pulse 70   Resp 16   Ht 5\' 2"  (1.575 m)   Wt 178 lb 9.6 oz (81 kg)   BMI 32.67 kg/m   Past Medical History:  Diagnosis Date  . Allergy   . 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 Hulda Humphrey of vascular surgery in [place]  . Arthritis    rt knee, foot  . Fibromuscular dysplasia ([place])   . GERD (gastroesophageal reflux disease)   . Hyperlipidemia   . Hypertension    For 16 years; urinary catecholamines within normal limits 12/09; renal Doppler ultrasound showed no evidence for renal artery stenosis  . Normal echocardiogram 4/10   LVEF 65%; no regional wall motion abnormalities; normal RV size and function; pulmonic valve had increased gradient across w/ peak gradient of about 36 mmHg (range of moderate pulmonic stenosis) followup showed normal valve  . Papilloma of breast    right  . Tinnitus of left ear    Social History   Socioeconomic History  . Marital status: Widowed    Spouse name: Not on file  . Number of children: 0  . Years of education: Not on file  . Highest education level: Not on file  Occupational History  .  Occupation: Scientific laboratory technician: RETIRED    Comment: Retired  Scientific laboratory technician  . Financial resource strain: Not on file  . Food insecurity:    Worry: Not on file    Inability: Not on file  . Transportation needs:    Medical: Not on file    Non-medical: Not on file  Tobacco Use  . Smoking status: Former Smoker    Last attempt to quit: 11/20/1982    Years since quitting: 35.2  . Smokeless tobacco: Never Used  . Tobacco comment: 15 pack year history  Substance and Sexual Activity    . Alcohol use: No  . Drug use: No  . Sexual activity: Not on file  Lifestyle  . Physical activity:    Days per week: Not on file    Minutes per session: Not on file  . Stress: Not on file  Relationships  . Social connections:    Talks on phone: Not on file    Gets together: Not on file    Attends religious service: Not on file    Active member of club or organization: Not on file    Attends meetings of clubs or organizations: Not on file    Relationship status: Not on file  . Intimate partner violence:    Fear of current or ex partner: Not on file    Emotionally abused: Not on file    Physically abused: Not on file    Forced sexual activity: Not on file  Other Topics Concern  . Not on file  Social History Narrative   Widowed in 2007-spouse died from colon cancer.   Regular exercise at [facility].   Diet consists of fruits and veggies, snacks a lot.       Past Surgical History:  Procedure Laterality Date  . ANGIOPLASTY     2012  . BREAST BIOPSY Right 2006   benign  . BREAST LUMPECTOMY WITH RADIOACTIVE SEED LOCALIZATION Right 08/14/2017   Procedure: RIGHT BREAST LUMPECTOMY WITH RADIOACTIVE SEED LOCALIZATION;  Surgeon: [name], MD;  Location: [facility];  Service: General;  Laterality: Right;  . Cardiolyte  11/07   Neg  . KNEE CARTILAGE SURGERY     right knee  . TOTAL ABDOMINAL HYSTERECTOMY     no cervix   Family History  Problem Relation Age of Onset  . Prostate cancer Father   . Colon cancer Maternal Uncle   . Colon cancer Paternal Uncle   . Coronary artery disease Paternal Grandmother   . Goiter Other        ?  . Esophageal cancer Neg Hx   . Stomach cancer Neg Hx   . Rectal cancer Neg Hx   . Bladder Cancer Neg Hx   . Kidney cancer Neg Hx    No Known Allergies     Assessment & Plan:  Patient presents for a yearly renal artery stenosis follow-up.  The patient has a past medical history of FML.  The patient denies any issues in regard to  controlling her hypertension or changes in her kidney function.  The patient underwent a bilateral renal ultrasound which was notable for 1-60% stenosis of the distal right renal artery.  No hemodynamically significant stenosis noted in the left renal artery.  Normal bilateral kidney length measurements.  When compared to the previous study on January 29, 2017 there has been no significant change.  The patient has not had her carotid arteries ultrasound in over a year.  The  patient denies experiencing Amaurosis Fugax, TIA like symptoms or focal motor deficits.  The patient does experience intermittent dizziness.  The patient denies any fever, nausea vomiting.  1. Renal artery stenosis (HCC) - Stable Patient with a past medical diagnosis of [place] The patient denies any issues in regard to controlling her hypertension or changes in her kidney function The patient underwent a bilateral renal duplex today which was notable for 1-60% stenosis of the distal right renal artery.  This is stable when compared to the previous study in March 2018. The patient is to follow-up in 1 year for surveillance renal duplex I have discussed with the patient at length the risk factors for and pathogenesis of atherosclerotic disease and encouraged a healthy diet, regular exercise regimen and blood pressure / glucose control.  Patient was instructed to contact our office in the interim with problems such as increasing / uncontrollable hypertension or changes in kidney function. The patient expresses their understanding.  - VAS US RENAL ARTERY DUPLEX; Future  2. Bilateral carotid artery stenosis - Stable The patient has not undergone a carotid duplex in over a year The patient is asymptomatic with the exception of intermittent "dizziness". The patient does have a past medical history of FML I will bring the patient back at her convenience to undergo bilateral carotid ultrasound to rule out any contributing FM L/carotid artery  disease.  - VAS US CAROTID; Future  3. Fibromuscular dysplasia (HCC) - Stable Contributing factor to the patient's renal and carotid artery stenosis  Current Outpatient Medications on File Prior to Visit  Medication Sig Dispense Refill  . aspirin EC 81 MG tablet Take 81 mg by mouth 2 (two) times a week.    . chlorthalidone (HYGROTON) 25 MG tablet TAKE 1 TABLET (25 MG TOTAL) BY MOUTH DAILY. 90 tablet 1  . cholecalciferol (VITAMIN D) 1000 units tablet Take 2,000 Units by mouth daily.    . metoprolol succinate (TOPROL-XL) 100 MG 24 hr tablet TAKE 1 TABLET BY MOUTH DAILY WITH OR IMMEDIATELY FOLLOWING A MEAL. 90 tablet 1  . Multiple Vitamin (MULTIVITAMIN) tablet Take 1 tablet by mouth daily.    Marland Kitchen NIFEdipine (PROCARDIA XL/ADALAT-CC) 60 MG 24 hr tablet Take 60 mg by mouth daily.  8  . pantoprazole (PROTONIX) 40 MG tablet TAKE 1 TABLET EVERY DAY 30 tablet 2  . Probiotic Product (PROBIOTIC-10 PO) Take by mouth daily.     No current facility-administered medications on file prior to visit.    There are no Patient Instructions on file for this visit. No follow-ups on file.  Lashonna Rieke A Bashar Milam, PA-C

## 2018-02-21 DIAGNOSIS — R311 Benign essential microscopic hematuria: Secondary | ICD-10-CM | POA: Diagnosis not present

## 2018-02-21 DIAGNOSIS — I10 Essential (primary) hypertension: Secondary | ICD-10-CM | POA: Diagnosis not present

## 2018-02-21 DIAGNOSIS — I701 Atherosclerosis of renal artery: Secondary | ICD-10-CM | POA: Diagnosis not present

## 2018-02-21 DIAGNOSIS — R3129 Other microscopic hematuria: Secondary | ICD-10-CM | POA: Diagnosis not present

## 2018-02-28 ENCOUNTER — Telehealth: Payer: Self-pay | Admitting: Family Medicine

## 2018-02-28 NOTE — Telephone Encounter (Signed)
Spoke to pt and advised she has not been seen in 65mos and OV is required. Scheduled for 4/16

## 2018-02-28 NOTE — Telephone Encounter (Signed)
Copied from [place] [phone]. Topic: Inquiry >> Feb 28, 2018 12:04 PM Margot Ables wrote: Reason for CRM: pt called stating that she has the same bump like she had 11/21/16. It is in that isolated area, it is not spreading. Pt is asking if abx can be prescribed again like was then that cleared it up. Please advise.  [id number] [place] [address], [place] [phone] (Phone) [phone] (Fax)

## 2018-03-05 ENCOUNTER — Ambulatory Visit: Payer: Medicare Other | Admitting: Family Medicine

## 2018-03-15 ENCOUNTER — Other Ambulatory Visit: Payer: Self-pay | Admitting: Family Medicine

## 2018-03-21 ENCOUNTER — Encounter (INDEPENDENT_AMBULATORY_CARE_PROVIDER_SITE_OTHER): Payer: Medicare Other

## 2018-03-21 ENCOUNTER — Ambulatory Visit (INDEPENDENT_AMBULATORY_CARE_PROVIDER_SITE_OTHER): Payer: Medicare Other | Admitting: Vascular Surgery

## 2018-03-21 ENCOUNTER — Ambulatory Visit: Payer: Self-pay

## 2018-03-21 NOTE — Telephone Encounter (Signed)
Patient called in with c/o "dizziness." She says "it comes and goes. I feel off balanced, kind of faint, and it makes me sit down and get my composure. Yesterday I was at a store, turned around and when I turned back, I was dizzy for a little. This morning, I was stepping off a ladder and felt dizzy, so I had to sit down. All of this started about 2 weeks ago and I need to be seen about it." I asked about other symptoms, she says "I do have sinus congestion, but nothing else." According to protocol, see PCP within 2 weeks, patient requested to be seen sooner than the first available appointment with PCP on 03/29/18, appointment scheduled for Monday, 03/25/18 with Clarene Reamer, NP, care advice given, patient verbalized understanding.  Reason for Disposition . [1] MILD dizziness (e.g., walking normally) AND [2] has been evaluated by physician for this  Answer Assessment - Initial Assessment Questions 1. DESCRIPTION: "Describe your dizziness."     Off balance 2. LIGHTHEADED: "Do you feel lightheaded?" (e.g., somewhat faint, woozy, weak upon standing)     Somewhat faint 3. VERTIGO: "Do you feel like either you or the room is spinning or tilting?" (i.e. vertigo)     No 4. SEVERITY: "How bad is it?"  "Do you feel like you are going to faint?" "Can you stand and walk?"   - MILD - walking normally   - MODERATE - interferes with normal activities (e.g., work, school)    - SEVERE - unable to stand, requires support to walk, feels like passing out now.      Mild to moderate 5. ONSET:  "When did the dizziness begin?"     2 weeks ago 6. AGGRAVATING FACTORS: "Does anything make it worse?" (e.g., standing, change in head position)     Change in position of head, turning 7. HEART RATE: "Can you tell me your heart rate?" "How many beats in 15 seconds?"  (Note: not all patients can do this)       80 8. CAUSE: "What do you think is causing the dizziness?"     I don't know 9. RECURRENT SYMPTOM: "Have you had  dizziness before?" If so, ask: "When was the last time?" "What happened that time?"     Yes, a long time ago but worse than this 10. OTHER SYMPTOMS: "Do you have any other symptoms?" (e.g., fever, chest pain, vomiting, diarrhea, bleeding)       Sinus problems since weather change 11. PREGNANCY: "Is there any chance you are pregnant?" "When was your last menstrual period?"       No  Protocols used: DIZZINESS [facility]

## 2018-03-25 ENCOUNTER — Ambulatory Visit: Payer: Medicare Other | Admitting: Family Medicine

## 2018-03-29 ENCOUNTER — Ambulatory Visit: Payer: Medicare Other | Admitting: Family Medicine

## 2018-04-24 ENCOUNTER — Ambulatory Visit: Payer: Self-pay

## 2018-04-24 NOTE — Telephone Encounter (Signed)
Pt. Reports she noticed yesterday on her fit bit her pulse had dropped to 53 one time. Denies any symptoms yesterday or today. States she has been working out on the elliptical without any difficulty . Also reports having some problems with constipation. Tried stool softeners. Will try some prunes today. Has an appointment Friday with her provider. Answer Assessment - Initial Assessment Questions 1. DESCRIPTION: "Please describe your heart rate or heart beat that you are having" (e.g., fast/slow, regular/irregular, skipped or extra beats, "palpitations")     Yesterday on her Fit bIT it showed her heart rate dipped to 53 x 1.  2. ONSET: "When did it start?" (Minutes, hours or days)      Thia happened yesterday. 3. DURATION: "How long does it last" (e.g., seconds, minutes, hours)     Just a few seconds 4. PATTERN "Does it come and go, or has it been constant since it started?"  "Does it get worse with exertion?"   "Are you feeling it now?"     Happened only one time. 5. TAP: "Using your hand, can you tap out what you are feeling on a chair or table in front of you, so that I can hear?" (Note: not all patients can do this)       N/A 6. HEART RATE: "Can you tell me your heart rate?" "How many beats in 15 seconds?"  (Note: not all patients can do this)       70 this morning 7. RECURRENT SYMPTOM: "Have you ever had this before?" If so, ask: "When was the last time?" and "What happened that time?"      No 8. CAUSE: "What do you think is causing the palpitations?"     No palpitations 9. CARDIAC HISTORY: "Do you have any history of heart disease?" (e.g., heart attack, angina, bypass surgery, angioplasty, arrhythmia)      No 10. OTHER SYMPTOMS: "Do you have any other symptoms?" (e.g., dizziness, chest pain, sweating, difficulty breathing)       No 11. PREGNANCY: "Is there any chance you are pregnant?" "When was your last menstrual period?"       n/a  Protocols used: HEART RATE AND HEARTBEAT  QUESTIONS-A-AH

## 2018-04-24 NOTE — Telephone Encounter (Signed)
Noted  

## 2018-04-24 NOTE — Telephone Encounter (Signed)
Pt has appt on 04/26/18 at 9:15.

## 2018-04-26 ENCOUNTER — Ambulatory Visit (INDEPENDENT_AMBULATORY_CARE_PROVIDER_SITE_OTHER): Payer: Medicare Other | Admitting: Family Medicine

## 2018-04-26 ENCOUNTER — Encounter: Payer: Self-pay | Admitting: Family Medicine

## 2018-04-26 DIAGNOSIS — R001 Bradycardia, unspecified: Secondary | ICD-10-CM | POA: Diagnosis not present

## 2018-04-26 DIAGNOSIS — K5900 Constipation, unspecified: Secondary | ICD-10-CM | POA: Diagnosis not present

## 2018-04-26 DIAGNOSIS — R194 Change in bowel habit: Secondary | ICD-10-CM | POA: Insufficient documentation

## 2018-04-26 NOTE — Assessment & Plan Note (Signed)
Occ asymptomatic.Marland Kitchen liekly due to [employer] and [place] channel blocker.. If persistent or becomes symptomatic .Marland Kitchen Consider decrease of medication.  BP well controlled.

## 2018-04-26 NOTE — Progress Notes (Signed)
   Subjective:    Patient ID: Kristy [name], female    DOB: 1941/07/11, 77 y.o.   MRN: [id number]  HPI   77 year old female presents with constipation x 6 months.   She changed her diet in 11/2017.Marland Kitchen Has been trying to eat healthier but still not eating much fiber. Minimal water. Skipping meals. May also have started off and on in last year.   She has tried stool softners, smooth move herbal... Helps some temporarily.   No blood in stool. No pain with BMs.   She has also noted lower pulse .. 53 on 6/5 .Marland Kitchen She is now on nifedipine per renal MD. No dizziness with lower pulse.     Adenomatous polyp and hyperplastic polyps 2104 colonoscopy.. Due for 3 year repeat. Blood pressure 132/88, pulse 67, temperature 98.3 F (36.8 C), temperature source Oral, height 5\' 2"  (1.575 m), weight 173 lb 1.9 oz (78.5 kg), SpO2 99 %. Social History /Family History/Past Medical History reviewed in detail and updated in EMR if needed.  Review of Systems  Constitutional: Negative for fatigue and fever.  HENT: Negative for congestion.   Eyes: Negative for pain.  Respiratory: Negative for cough and shortness of breath.   Cardiovascular: Negative for chest pain, palpitations and leg swelling.  Gastrointestinal: Positive for constipation. Negative for abdominal pain.  Genitourinary: Negative for dysuria and vaginal bleeding.  Musculoskeletal: Negative for back pain.  Neurological: Negative for syncope, light-headedness and headaches.  Psychiatric/Behavioral: Negative for dysphoric mood.       Objective:   Physical Exam  Constitutional: Vital signs are normal. She appears well-developed and well-nourished. She is cooperative.  Non-toxic appearance. She does not appear ill. No distress.  HENT:  Head: Normocephalic.  Right Ear: Hearing, tympanic membrane, external ear and ear canal normal. Tympanic membrane is not erythematous, not retracted and not bulging.  Left Ear: Hearing, tympanic membrane,  external ear and ear canal normal. Tympanic membrane is not erythematous, not retracted and not bulging.  Nose: No mucosal edema or rhinorrhea. Right sinus exhibits no maxillary sinus tenderness and no frontal sinus tenderness. Left sinus exhibits no maxillary sinus tenderness and no frontal sinus tenderness.  Mouth/Throat: Uvula is midline, oropharynx is clear and moist and mucous membranes are normal.  Eyes: Pupils are equal, round, and reactive to light. Conjunctivae, EOM and lids are normal. Lids are everted and swept, no foreign bodies found.  Neck: Trachea normal and normal range of motion. Neck supple. Carotid bruit is not present. No thyroid mass and no thyromegaly present.  Cardiovascular: Normal rate, regular rhythm, S1 normal, S2 normal, normal heart sounds, intact distal pulses and normal pulses. Exam reveals no gallop and no friction rub.  No murmur heard. Pulmonary/Chest: Effort normal and breath sounds normal. No tachypnea. No respiratory distress. She has no decreased breath sounds. She has no wheezes. She has no rhonchi. She has no rales.  Abdominal: Soft. Normal appearance and bowel sounds are normal. There is no tenderness.  Neurological: She is alert.  Skin: Skin is warm, dry and intact. No rash noted.  Psychiatric: Her speech is normal and behavior is normal. Judgment and thought content normal. Her mood appears not anxious. Cognition and memory are normal. She does not exhibit a depressed mood.          Assessment & Plan:

## 2018-04-26 NOTE — Patient Instructions (Addendum)
Increase fiber in diet.. Add benefiber daily. Increase water. Can use miralax for constipation daily.  Continue exercie.  Follow BP and pulse at home... If pulse is frequently low < 60 and if symptomatic  With dizziness. Call for a decrease in medication.  Call to set up GI OV for possible repeat colonoscopy  Given new constipation with Dr. Fuller Plan.

## 2018-04-26 NOTE — Assessment & Plan Note (Signed)
Given new and adenomatous results on last colnoscopy in 2014.Marland Kitchen Recommend return to GI .  May be due to diet change and nifedipine SE.   Increase fiber in diet ( info given) add fiber supplement and water.  Continue exercise.

## 2018-06-06 DIAGNOSIS — I701 Atherosclerosis of renal artery: Secondary | ICD-10-CM | POA: Diagnosis not present

## 2018-06-06 DIAGNOSIS — I10 Essential (primary) hypertension: Secondary | ICD-10-CM | POA: Diagnosis not present

## 2018-06-06 DIAGNOSIS — R3129 Other microscopic hematuria: Secondary | ICD-10-CM | POA: Diagnosis not present

## 2018-06-27 ENCOUNTER — Ambulatory Visit (INDEPENDENT_AMBULATORY_CARE_PROVIDER_SITE_OTHER): Payer: Medicare Other | Admitting: Family Medicine

## 2018-06-27 ENCOUNTER — Encounter: Payer: Self-pay | Admitting: Family Medicine

## 2018-06-27 VITALS — BP 110/70 | HR 75 | Temp 98.3°F | Ht 62.0 in | Wt 177.5 lb

## 2018-06-27 DIAGNOSIS — E876 Hypokalemia: Secondary | ICD-10-CM

## 2018-06-27 DIAGNOSIS — G8929 Other chronic pain: Secondary | ICD-10-CM

## 2018-06-27 DIAGNOSIS — M545 Low back pain, unspecified: Secondary | ICD-10-CM

## 2018-06-27 DIAGNOSIS — R8290 Unspecified abnormal findings in urine: Secondary | ICD-10-CM | POA: Diagnosis not present

## 2018-06-27 DIAGNOSIS — R3129 Other microscopic hematuria: Secondary | ICD-10-CM | POA: Diagnosis not present

## 2018-06-27 LAB — POC URINALSYSI DIPSTICK (AUTOMATED)
Bilirubin, UA: NEGATIVE
Glucose, UA: NEGATIVE
Ketones, UA: NEGATIVE
Leukocytes, UA: NEGATIVE
Nitrite, UA: NEGATIVE
Protein, UA: NEGATIVE
Spec Grav, UA: 1.02 (ref 1.010–1.025)
Urobilinogen, UA: 0.2 E.U./dL
pH, UA: 6 (ref 5.0–8.0)

## 2018-06-27 LAB — POTASSIUM: Potassium: 3 mEq/L — ABNORMAL LOW (ref 3.5–5.1)

## 2018-06-27 NOTE — Assessment & Plan Note (Signed)
Likely ammonia smell. Pt denies current vaginal symptoms.  Increase water as able.

## 2018-06-27 NOTE — Assessment & Plan Note (Signed)
Longterm stable.

## 2018-06-27 NOTE — Patient Instructions (Addendum)
Please stop at the lab to have labs drawn.   Increase water as much as able.

## 2018-06-27 NOTE — Progress Notes (Signed)
   Subjective:    Patient ID: Kristy [name], female    DOB: [birth year], 77 y.o.   MRN: [id number]  HPI  77 year old female presents with new onset abnormal odor of urine and low back pain.  Started with  1 week ago small amount of vaginal discharge... White. Mild vaginal itch but none.  using simply safe wash.Kristy [name].   Now odor to urine.. Improves with fluids but then return. No urinary frequency or urgency. No dysuria.   UA in office is clear except large blood.  Appears blood in urine in last year on previous UAs.  Evaluated by Dr. Holley Raring.Marland Kitchen Has been present for 40 years.   She has started potassium, no other new meds.  No diet changes.   Blood pressure 110/70, pulse 75, temperature 98.3 F (36.8 C), temperature source Oral, height 5\' 2"  (1.575 m), weight 177 lb 8 oz (80.5 kg).  Review of Systems  Constitutional: Negative for fatigue and fever.  HENT: Negative for congestion.   Eyes: Negative for pain.  Respiratory: Negative for cough and shortness of breath.   Cardiovascular: Negative for chest pain, palpitations and leg swelling.  Gastrointestinal: Positive for constipation. Negative for abdominal pain.  Genitourinary: Negative for dysuria and vaginal bleeding.  Musculoskeletal: Negative for back pain.  Neurological: Negative for syncope, light-headedness and headaches.  Psychiatric/Behavioral: Negative for dysphoric mood.       Objective:   Physical Exam  Constitutional: Vital signs are normal. She appears well-developed and well-nourished. She is cooperative.  Non-toxic appearance. She does not appear ill. No distress.  HENT:  Head: Normocephalic.  Right Ear: Hearing, tympanic membrane, external ear and ear canal normal. Tympanic membrane is not erythematous, not retracted and not bulging.  Left Ear: Hearing, tympanic membrane, external ear and ear canal normal. Tympanic membrane is not erythematous, not retracted and not bulging.  Nose: No mucosal edema  or rhinorrhea. Right sinus exhibits no maxillary sinus tenderness and no frontal sinus tenderness. Left sinus exhibits no maxillary sinus tenderness and no frontal sinus tenderness.  Mouth/Throat: Uvula is midline, oropharynx is clear and moist and mucous membranes are normal.  Eyes: Pupils are equal, round, and reactive to light. Conjunctivae, EOM and lids are normal. Lids are everted and swept, no foreign bodies found.  Neck: Trachea normal and normal range of motion. Neck supple. Carotid bruit is not present. No thyroid mass and no thyromegaly present.  Cardiovascular: Normal rate, regular rhythm, S1 normal, S2 normal, normal heart sounds, intact distal pulses and normal pulses. Exam reveals no gallop and no friction rub.  No murmur heard. Pulmonary/Chest: Effort normal and breath sounds normal. No tachypnea. No respiratory distress. She has no decreased breath sounds. She has no wheezes. She has no rhonchi. She has no rales.  Abdominal: Soft. Normal appearance and bowel sounds are normal. There is no tenderness.  Neurological: She is alert.  Skin: Skin is warm, dry and intact. No rash noted.  Psychiatric: Her speech is normal and behavior is normal. Judgment and thought content normal. Her mood appears not anxious. Cognition and memory are normal. She does not exhibit a depressed mood.          Assessment & Plan:

## 2018-06-27 NOTE — Assessment & Plan Note (Signed)
Re-eval on potassium supplement.

## 2018-06-27 NOTE — Assessment & Plan Note (Signed)
Not clearly related to urinary symptoms.

## 2018-07-04 ENCOUNTER — Telehealth: Payer: Self-pay | Admitting: *Deleted

## 2018-07-04 NOTE — Telephone Encounter (Signed)
Copied from [place] [id number]. Topic: General - Other >> Jul 04, 2018 12:53 PM [name] wrote:  Pt is asking if it is ok for her to take  cetirizine hydrochloride  and if so she would like a RX called in to   [facility]

## 2018-07-05 MED ORDER — CETIRIZINE HCL 10 MG PO TABS: 10.0000 mg | ORAL_TABLET | Freq: Every day | ORAL | 11 refills | Status: DC

## 2018-07-05 NOTE — Telephone Encounter (Signed)
Kristy [name] notified by telephone that it is okay for her to take cetirizine and Dr. Diona Browner has sent in prescription to [facility] on [address].

## 2018-07-05 NOTE — Telephone Encounter (Signed)
Okay to take.. I sent in Rx.

## 2018-07-07 ENCOUNTER — Other Ambulatory Visit: Payer: Self-pay | Admitting: Family Medicine

## 2018-07-15 ENCOUNTER — Other Ambulatory Visit: Payer: Self-pay | Admitting: Family Medicine

## 2018-07-24 ENCOUNTER — Other Ambulatory Visit: Payer: Self-pay | Admitting: Family Medicine

## 2018-07-24 DIAGNOSIS — Z1231 Encounter for screening mammogram for malignant neoplasm of breast: Secondary | ICD-10-CM

## 2018-07-30 ENCOUNTER — Ambulatory Visit
Admission: RE | Admit: 2018-07-30 | Discharge: 2018-07-30 | Disposition: A | Discharge status: Home / Self Care | Payer: Medicare Other | Source: Ambulatory Visit | Attending: Family Medicine | Admitting: Family Medicine

## 2018-07-30 DIAGNOSIS — Z1231 Encounter for screening mammogram for malignant neoplasm of breast: Secondary | ICD-10-CM

## 2018-09-05 ENCOUNTER — Ambulatory Visit (INDEPENDENT_AMBULATORY_CARE_PROVIDER_SITE_OTHER): Payer: Medicare Other

## 2018-09-05 DIAGNOSIS — Z23 Encounter for immunization: Secondary | ICD-10-CM

## 2018-09-11 ENCOUNTER — Other Ambulatory Visit: Payer: Self-pay | Admitting: Family Medicine

## 2018-10-05 ENCOUNTER — Other Ambulatory Visit: Payer: Self-pay | Admitting: Family Medicine

## 2018-10-07 ENCOUNTER — Other Ambulatory Visit: Payer: Self-pay | Admitting: Family Medicine

## 2018-11-04 ENCOUNTER — Telehealth: Payer: Self-pay | Admitting: Family Medicine

## 2018-11-04 DIAGNOSIS — M810 Age-related osteoporosis without current pathological fracture: Secondary | ICD-10-CM

## 2018-11-04 DIAGNOSIS — E0590 Thyrotoxicosis, unspecified without thyrotoxic crisis or storm: Secondary | ICD-10-CM

## 2018-11-04 DIAGNOSIS — R7303 Prediabetes: Secondary | ICD-10-CM

## 2018-11-04 DIAGNOSIS — E785 Hyperlipidemia, unspecified: Secondary | ICD-10-CM

## 2018-11-04 NOTE — Telephone Encounter (Signed)
-----   Message from Eustace Pen, LPN sent at 06/34/9494  3:24 PM EST ----- Regarding: Labs 12/17 Lab orders needed. Thank you.  Insurance:  [facility] Medicare

## 2018-11-05 ENCOUNTER — Ambulatory Visit (INDEPENDENT_AMBULATORY_CARE_PROVIDER_SITE_OTHER): Payer: Medicare Other

## 2018-11-05 ENCOUNTER — Telehealth: Payer: Self-pay

## 2018-11-05 VITALS — BP 136/80 | HR 77 | Temp 98.2°F | Ht 63.25 in | Wt 177.2 lb

## 2018-11-05 DIAGNOSIS — E785 Hyperlipidemia, unspecified: Secondary | ICD-10-CM

## 2018-11-05 DIAGNOSIS — R7303 Prediabetes: Secondary | ICD-10-CM

## 2018-11-05 DIAGNOSIS — Z0000 Encounter for general adult medical examination without abnormal findings: Secondary | ICD-10-CM

## 2018-11-05 DIAGNOSIS — E0590 Thyrotoxicosis, unspecified without thyrotoxic crisis or storm: Secondary | ICD-10-CM | POA: Diagnosis not present

## 2018-11-05 DIAGNOSIS — Z1211 Encounter for screening for malignant neoplasm of colon: Secondary | ICD-10-CM

## 2018-11-05 LAB — COMPREHENSIVE METABOLIC PANEL
ALT: 14 U/L (ref 0–35)
AST: 16 U/L (ref 0–37)
Albumin: 4.1 g/dL (ref 3.5–5.2)
Alkaline Phosphatase: 49 U/L (ref 39–117)
BUN: 13 mg/dL (ref 6–23)
CO2: 30 mEq/L (ref 19–32)
Calcium: 10.1 mg/dL (ref 8.4–10.5)
Chloride: 102 mEq/L (ref 96–112)
Creatinine, Ser: 0.74 mg/dL (ref 0.40–1.20)
GFR: 98.28 mL/min (ref >60.00)
Glucose, Bld: 117 mg/dL — ABNORMAL HIGH (ref 70–99)
Potassium: 3.5 mEq/L (ref 3.5–5.1)
Sodium: 139 mEq/L (ref 135–145)
Total Bilirubin: 0.5 mg/dL (ref 0.2–1.2)
Total Protein: 7.1 g/dL (ref 6.0–8.3)

## 2018-11-05 LAB — LIPID PANEL
Cholesterol: 205 mg/dL — ABNORMAL HIGH (ref 0–200)
HDL: 48.9 mg/dL (ref >39.00)
LDL Cholesterol: 123 mg/dL — ABNORMAL HIGH (ref 0–99)
NonHDL: 155.74
Total CHOL/HDL Ratio: 4
Triglycerides: 164 mg/dL — ABNORMAL HIGH (ref 0.0–149.0)
VLDL: 32.8 mg/dL (ref 0.0–40.0)

## 2018-11-05 LAB — T4, FREE: Free T4: 0.64 ng/dL (ref 0.60–1.60)

## 2018-11-05 LAB — HEMOGLOBIN A1C: Hgb A1c MFr Bld: 6 % (ref 4.6–6.5)

## 2018-11-05 LAB — T3, FREE: T3, Free: 3 pg/mL (ref 2.3–4.2)

## 2018-11-05 LAB — TSH: TSH: 2.49 u[IU]/mL (ref 0.35–4.50)

## 2018-11-05 MED ORDER — CYCLOBENZAPRINE HCL 10 MG PO TABS: 5.0000 mg | ORAL_TABLET | Freq: Every evening | ORAL | 0 refills | Status: DC | PRN

## 2018-11-05 NOTE — Patient Instructions (Signed)
Kristy [name] , Thank you for taking time to come for your Medicare Wellness Visit. I appreciate your ongoing commitment to your health goals. Please review the following plan we discussed and let me know if I can assist you in the future.   These are the goals we discussed: Goals    . Increase physical activity     Starting 11/05/2018, I will continue to walk at least 5000 steps daily.        This is a list of the screening recommended for you and due dates:  Health Maintenance  Topic Date Due  . Colon Cancer Screening  11/20/2019*  . Tetanus Vaccine  04/19/2026*  . Flu Shot  Completed  . DEXA scan (bone density measurement)  Completed  . Pneumonia vaccines  Completed  *Topic was postponed. The date shown is not the original due date.   Preventive Care for Adults  A healthy lifestyle and preventive care can promote health and wellness. Preventive health guidelines for adults include the following key practices.  . A routine yearly physical is a good way to check with your health care provider about your health and preventive screening. It is a chance to share any concerns and updates on your health and to receive a thorough exam.  . Visit your dentist for a routine exam and preventive care every 6 months. Brush your teeth twice a day and floss once a day. Good oral hygiene prevents tooth decay and gum disease.  . The frequency of eye exams is based on your age, health, family medical history, use  of contact lenses, and other factors. Follow your health care provider's recommendations for frequency of eye exams.  . Eat a healthy diet. Foods like vegetables, fruits, whole grains, low-fat dairy products, and lean protein foods contain the nutrients you need without too many calories. Decrease your intake of foods high in solid fats, added sugars, and salt. Eat the right amount of calories for you. Get information about a proper diet from your health care provider, if necessary.  .  Regular physical exercise is one of the most important things you can do for your health. Most adults should get at least 150 minutes of moderate-intensity exercise (any activity that increases your heart rate and causes you to sweat) each week. In addition, most adults need muscle-strengthening exercises on 2 or more days a week.  Silver Sneakers may be a benefit available to you. To determine eligibility, you may visit the website: www.silversneakers.com or contact program at (786)189-0942 Mon-Fri between 8AM-8PM.   . Maintain a healthy weight. The body mass index (BMI) is a screening tool to identify possible weight problems. It provides an estimate of body fat based on height and weight. Your health care provider can find your BMI and can help you achieve or maintain a healthy weight.   For adults 20 years and older: ? A BMI below 18.5 is considered underweight. ? A BMI of 18.5 to 24.9 is normal. ? A BMI of 25 to 29.9 is considered overweight. ? A BMI of 30 and above is considered obese.   . Maintain normal blood lipids and cholesterol levels by exercising and minimizing your intake of saturated fat. Eat a balanced diet with plenty of fruit and vegetables. Blood tests for lipids and cholesterol should begin at age 73 and be repeated every 5 years. If your lipid or cholesterol levels are high, you are over 50, or you are at high risk for heart disease, you  may need your cholesterol levels checked more frequently. Ongoing high lipid and cholesterol levels should be treated with medicines if diet and exercise are not working.  . If you smoke, find out from your health care provider how to quit. If you do not use tobacco, please do not start.  . If you choose to drink alcohol, please do not consume more than 2 drinks per day. One drink is considered to be 12 ounces (355 mL) of beer, 5 ounces (148 mL) of wine, or 1.5 ounces (44 mL) of liquor.  . If you are 70-42 years old, ask your health care  provider if you should take aspirin to prevent strokes.  . Use sunscreen. Apply sunscreen liberally and repeatedly throughout the day. You should seek shade when your shadow is shorter than you. Protect yourself by wearing long sleeves, pants, a wide-brimmed hat, and sunglasses year round, whenever you are outdoors.  . Once a month, do a whole body skin exam, using a mirror to look at the skin on your back. Tell your health care provider of new moles, moles that have irregular borders, moles that are larger than a pencil eraser, or moles that have changed in shape or color.

## 2018-11-05 NOTE — Progress Notes (Signed)
Subjective:   Kristy [name] is a 77 y.o. female who presents for Medicare Annual (Subsequent) preventive examination.  Review of Systems:  N/A Cardiac Risk Factors include: advanced age (>32men, >63 women);dyslipidemia;hypertension     Objective:     Vitals: BP 136/80 (BP Location: Right Arm, Patient Position: Sitting, Cuff Size: Normal)   Pulse 77   Temp 98.2 F (36.8 C) (Oral)   Ht 5' 3.25" (1.607 m) Comment: shoes  Wt 177 lb 4 oz (80.4 kg)   SpO2 96%   BMI 31.15 kg/m   Body mass index is 31.15 kg/m.  Advanced Directives 11/05/2018 09/04/2017 08/14/2017 08/07/2017 03/02/2017 01/29/2017 06/01/2016  Does Patient Have a Medical Advance Directive? No No No No No Yes Yes  Type of Advance Directive - - - - - Living will;Healthcare Power of [place];Living will  Copy of [place] in Chart? - - - - - - No - copy requested  Would patient like information on creating a medical advance directive? Yes (MAU/Ambulatory/Procedural Areas - Information given) - No - Patient declined - - - -    Tobacco Social History   Tobacco Use  Smoking Status Former Smoker  . Last attempt to quit: 11/20/1982  . Years since quitting: 35.9  Smokeless Tobacco Never Used  Tobacco Comment   15 pack year history     Counseling given: No Comment: 15 pack year history   Clinical Intake:  Pre-visit preparation completed: Yes  Pain : 0-10 Pain Score: 7  Pain Type: Acute pain Pain Location: Generalized(neck and shoulder) Pain Orientation: Right Pain Onset: In the past 7 days Pain Frequency: Constant     Nutritional Status: BMI > 30  Obese Nutritional Risks: None Diabetes: No  How often do you need to have someone help you when you read instructions, pamphlets, or other written materials from your doctor or pharmacy?: 1 - Never What is the last grade level you completed in school?: 12th grade + some college courses  Interpreter Needed?:  No  Comments: pt lives with significant other Information entered by :: LPinson, LPN  Past Medical History:  Diagnosis Date  . Allergy   . 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 Hulda Humphrey of vascular surgery in [place]  . Arthritis    rt knee, foot  . Fibromuscular dysplasia ([place])   . GERD (gastroesophageal reflux disease)   . Hyperlipidemia   . Hypertension    For 16 years; urinary catecholamines within normal limits 12/09; renal Doppler ultrasound showed no evidence for renal artery stenosis  . Normal echocardiogram 4/10   LVEF 65%; no regional wall motion abnormalities; normal RV size and function; pulmonic valve had increased gradient across w/ peak gradient of about 36 mmHg (range of moderate pulmonic stenosis) followup showed normal valve  . Papilloma of breast    right  . Tinnitus of left ear    Past Surgical History:  Procedure Laterality Date  . ANGIOPLASTY     2012  . BREAST BIOPSY Right 2006   benign  . BREAST EXCISIONAL BIOPSY Right 07/2017   benign  . BREAST LUMPECTOMY WITH RADIOACTIVE SEED LOCALIZATION Right 08/14/2017   Procedure: RIGHT BREAST LUMPECTOMY WITH RADIOACTIVE SEED LOCALIZATION;  Surgeon: [name], MD;  Location: [facility];  Service: General;  Laterality: Right;  . Cardiolyte  11/07   Neg  . KNEE CARTILAGE SURGERY     right knee  .  TOTAL ABDOMINAL HYSTERECTOMY     no cervix   Family History  Problem Relation Age of Onset  . Prostate cancer Father   . Colon cancer Maternal Uncle   . Colon cancer Paternal Uncle   . Coronary artery disease Paternal Grandmother   . Goiter Other        ?  . Esophageal cancer Neg Hx   . Stomach cancer Neg Hx   . Rectal cancer Neg Hx   . Bladder Cancer Neg Hx   . Kidney cancer Neg Hx    Social History   Socioeconomic History  . Marital status: Widowed    Spouse name: Not on file  . Number of children: 0  . Years of  education: Not on file  . Highest education level: Not on file  Occupational History  . Occupation: Scientific laboratory technician: RETIRED    Comment: Retired  Scientific laboratory technician  . Financial resource strain: Not on file  . Food insecurity:    Worry: Not on file    Inability: Not on file  . Transportation needs:    Medical: Not on file    Non-medical: Not on file  Tobacco Use  . Smoking status: Former Smoker    Last attempt to quit: 11/20/1982    Years since quitting: 35.9  . Smokeless tobacco: Never Used  . Tobacco comment: 15 pack year history  Substance and Sexual Activity  . Alcohol use: No  . Drug use: No  . Sexual activity: Not on file  Lifestyle  . Physical activity:    Days per week: Not on file    Minutes per session: Not on file  . Stress: Not on file  Relationships  . Social connections:    Talks on phone: Not on file    Gets together: Not on file    Attends religious service: Not on file    Active member of club or organization: Not on file    Attends meetings of clubs or organizations: Not on file    Relationship status: Not on file  Other Topics Concern  . Not on file  Social History Narrative   Widowed in 2007-spouse died from colon cancer.   Regular exercise at [facility].   Diet consists of fruits and veggies, snacks a lot.        Outpatient Encounter Medications as of 11/05/2018  Medication Sig  . cetirizine (ZYRTEC) 10 MG tablet Take 1 tablet (10 mg total) by mouth daily.  . chlorthalidone (HYGROTON) 25 MG tablet TAKE 1 TABLET BY MOUTH DAILY  . cholecalciferol (VITAMIN D) 1000 units tablet Take 2,000 Units by mouth daily.  Marland Kitchen KLOR-CON M20 20 MEQ tablet Take 20 mEq by mouth daily.  . metoprolol succinate (TOPROL-XL) 100 MG 24 hr tablet TAKE 1 TABLET BY MOUTH DAILY WITH OR IMMEDIATELY FOLLOWING A MEAL.  Marland Kitchen NIFEdipine (PROCARDIA XL/ADALAT-CC) 60 MG 24 hr tablet Take 60 mg by mouth daily.  . pantoprazole (PROTONIX) 40 MG tablet TAKE 1 TABLET BY MOUTH EVERY DAY  .  Probiotic Product (PROBIOTIC-10 PO) Take by mouth daily.   No facility-administered encounter medications on file as of 11/05/2018.     Activities of Daily Living In your present state of health, do you have any difficulty performing the following activities: 11/05/2018  Hearing? N  Vision? Y  Difficulty concentrating or making decisions? N  Walking or climbing stairs? N  Dressing or bathing? N  Doing errands, shopping? N  Preparing Food and eating ?  N  Using the Toilet? N  In the past six months, have you accidently leaked urine? Y  Do you have problems with loss of bowel control? N  Managing your Medications? N  Managing your Finances? N  Housekeeping or managing your Housekeeping? N  Some recent data might be hidden    Patient Care Team: Jinny Sanders, MD as PCP - General    Assessment:   This is a routine wellness examination for [place].   Hearing Screening   125Hz  250Hz  500Hz  1000Hz  2000Hz  3000Hz  4000Hz  6000Hz  8000Hz   Right ear:   40 40 40  40    Left ear:   40 0 40  0    Vision Screening Comments: Vision exam in Jan 2019 with Dr. Maryruth Hancock B.   Exercise Activities and Dietary recommendations Current Exercise Habits: Home exercise routine, Type of exercise: walking, Exercise limited by: None identified  Goals    . Increase physical activity     Starting 11/05/2018, I will continue to walk at least 5000 steps daily.        Fall Risk Fall Risk  11/05/2018 10/05/2017 09/04/2017 06/01/2016 05/20/2015  Falls in the past year? 0 No No No No   Depression Screen PHQ 2/9 Scores 11/05/2018 10/05/2017 09/04/2017 06/01/2016  PHQ - 2 Score 0 0 0 0  PHQ- 9 Score 0 - 2 -     Cognitive Function MMSE - Mini Mental State Exam 11/05/2018 09/04/2017  Orientation to time 5 5  Orientation to Place 5 5  Registration 3 3  Attention/ Calculation 0 0  Recall 3 3  Language- name 2 objects 0 0  Language- repeat 1 1  Language- follow 3 step command 3 3  Language- read & follow  direction 0 0  Write a sentence 0 0  Copy design 0 0  Total score 20 20     PLEASE NOTE: A Mini-Cog screen was completed. Maximum score is 20. A value of 0 denotes this part of [place] MMSE was not completed or the patient failed this part of the Mini-Cog screening.   Mini-Cog Screening Orientation to Time - Max 5 pts Orientation to Place - Max 5 pts Registration - Max 3 pts Recall - Max 3 pts Language Repeat - Max 1 pts Language Follow 3 Step Command - Max 3 pts     Immunization History  Administered Date(s) Administered  . Influenza Split 10/03/2011, 08/30/2012  . Influenza Whole 08/28/2007, 08/19/2008, 08/12/2009, 09/14/2010  . Influenza,inj,Quad PF,6+ Mos 08/13/2013, 08/18/2014, 09/03/2015, 08/03/2016, 09/04/2017, 09/05/2018  . Pneumococcal Conjugate-13 01/09/2014  . Pneumococcal Polysaccharide-23 04/20/2006, 09/04/2017  . Td 04/20/2006    Screening Tests Health Maintenance  Topic Date Due  . COLONOSCOPY  11/20/2019 (Originally 10/23/2018)  . TETANUS/TDAP  04/19/2026 (Originally 04/20/2016)  . INFLUENZA VACCINE  Completed  . DEXA SCAN  Completed  . PNA vac Low Risk Adult  Completed      Plan:     I have personally reviewed, addressed, and noted the following in the patient's chart:  A. Medical and social history B. Use of alcohol, tobacco or illicit drugs  C. Current medications and supplements D. Functional ability and status E.  Nutritional status F.  Physical activity G. Advance directives H. List of other physicians I.  Hospitalizations, surgeries, and ER visits in previous 12 months J.  [place] to include hearing, vision, cognitive, depression L. Referrals and appointments - none  In addition, I have reviewed and discussed with patient certain  preventive protocols, quality metrics, and best practice recommendations. A written personalized care plan for preventive services as well as general preventive health recommendations were provided to  patient.  See attached scanned questionnaire for additional information.   Signed,   Lindell Noe, MHA, BS, LPN Health Coach

## 2018-11-05 NOTE — Telephone Encounter (Signed)
Referral to Gi sent.  Sent in Rx for muscle relaxant.

## 2018-11-05 NOTE — Telephone Encounter (Signed)
Patient in office today for AWV.   Reports she has limited ROM on right side of neck. States she was lying in bed and felt something pull on side of neck. Pain radiates to shoulder. Pain scale: 7/10. Has been placing heating pad on neck. PCP notified.  Per PCP instructions, advised patient to continue using heating pad, to begin massaging neck, and to do neck stretches. Patient verbalized understanding.   Patient also requested a referral for a colonoscopy. Dr. Lucio Edward is preferred provider. Per HM, colonoscopy due in December 2019.

## 2018-11-05 NOTE — Progress Notes (Signed)
PCP notes:   Health maintenance:  Colonoscopy - pt requested referral to GI  Abnormal screenings:   Hearing - failed  Hearing Screening   125Hz  250Hz  500Hz  1000Hz  2000Hz  3000Hz  4000Hz  6000Hz  8000Hz   Right ear:   40 40 40  40    Left ear:   40 0 40  0     Patient concerns:   Reports she has limited ROM on right side of neck. States she was lying in bed and felt something pull on side of neck. Pain radiates to shoulder. Pain scale: 7/10. Has been placing heating pad on neck. PCP notified.  Per PCP instructions, advised patient to continue using heating pad, to begin massaging neck, and to do neck stretches. Patient verbalized understanding.   Nurse concerns:  None  Next PCP appt:   11/22/2018 @ 1130

## 2018-11-11 NOTE — Progress Notes (Signed)
I reviewed health advisor's note, was available for consultation, and agree with documentation and plan.  

## 2018-11-15 ENCOUNTER — Encounter: Payer: Medicare Other | Admitting: Family Medicine

## 2018-11-17 ENCOUNTER — Other Ambulatory Visit: Payer: Self-pay | Admitting: Family Medicine

## 2018-11-18 NOTE — Telephone Encounter (Signed)
Last office visit 06/27/2018 for Abnormal odor to urine.  Last refilled 11/05/2018 for #15 with no refills.  Next appt: 11/22/2018 for CPE.  Ok to refill?

## 2018-11-22 ENCOUNTER — Ambulatory Visit (INDEPENDENT_AMBULATORY_CARE_PROVIDER_SITE_OTHER): Payer: Medicare Other | Admitting: Family Medicine

## 2018-11-22 ENCOUNTER — Encounter: Payer: Self-pay | Admitting: Family Medicine

## 2018-11-22 VITALS — BP 120/70 | HR 72 | Temp 98.1°F | Ht 63.25 in | Wt 177.2 lb

## 2018-11-22 DIAGNOSIS — I773 Arterial fibromuscular dysplasia: Secondary | ICD-10-CM

## 2018-11-22 DIAGNOSIS — I10 Essential (primary) hypertension: Secondary | ICD-10-CM

## 2018-11-22 DIAGNOSIS — Z0000 Encounter for general adult medical examination without abnormal findings: Secondary | ICD-10-CM

## 2018-11-22 DIAGNOSIS — E785 Hyperlipidemia, unspecified: Secondary | ICD-10-CM

## 2018-11-22 DIAGNOSIS — R7303 Prediabetes: Secondary | ICD-10-CM | POA: Diagnosis not present

## 2018-11-22 DIAGNOSIS — E0590 Thyrotoxicosis, unspecified without thyrotoxic crisis or storm: Secondary | ICD-10-CM | POA: Diagnosis not present

## 2018-11-22 DIAGNOSIS — I6523 Occlusion and stenosis of bilateral carotid arteries: Secondary | ICD-10-CM

## 2018-11-22 MED ORDER — ATORVASTATIN CALCIUM 20 MG PO TABS: 20.0000 mg | ORAL_TABLET | Freq: Every day | ORAL | 11 refills | Status: DC

## 2018-11-22 NOTE — Assessment & Plan Note (Signed)
Stable control. 

## 2018-11-22 NOTE — Assessment & Plan Note (Signed)
>   10 % risk of CVD: start on statin low dose and re-check chol in 3 months.

## 2018-11-22 NOTE — Assessment & Plan Note (Signed)
Well controlled. Continue current medication.  

## 2018-11-22 NOTE — Assessment & Plan Note (Signed)
Followed by vascular 

## 2018-11-22 NOTE — Progress Notes (Signed)
Subjective:    Patient ID: Kristy [name], female    DOB: 1941-09-04, 78 y.o.   MRN: [id number]  HPI  The patient presents for complete physical and review of chronic health problems. He/She also has the following acute concerns today: Neck strain, resolving. See below.  The patient saw Candis Musa, LPN for medicare wellness. Note reviewed in detail and important notes copied below.  Health maintenance:  Colonoscopy - pt requested referral to GI  Abnormal screenings:   Hearing - failed             Hearing Screening   125Hz  250Hz  500Hz  1000Hz  2000Hz  3000Hz  4000Hz  6000Hz  8000Hz   Right ear:   40 40 40  40    Left ear:   40 0 40  0     Patient concerns:  Reports she has limited ROM on right side of neck. States she was lying in bed and felt something pull on side of neck. Pain radiates to shoulder. Pain scale: 7/10. Has been placing heating pad on neck. PCP notified.  Per PCP instructions, advised patient to continue using heating pad, to begin massaging neck, and to do neck stretches. Patient verbalized understanding.   11/22/18  Today   Neck strain: Improving  Range of motion.  no radiation of pain, mild weakness in right arm, no numbness.  Hypertension:   Good control on amlodipine and metoprolol.  Using medication without problems or lightheadedness: none Chest pain with exertion:none Edema:none Short of breath:none Average home BPs: Other issues:  Elevated Cholesterol:  Improved  Control in last year. LDL at 123. !3 % risk of CVD in last  Lab Results  Component Value Date   CHOL 205 (H) 11/05/2018   HDL 48.90 11/05/2018   LDLCALC 123 (H) 11/05/2018   LDLDIRECT 140.3 03/26/2013   TRIG 164.0 (H) 11/05/2018   CHOLHDL 4 11/05/2018  Using medications without problems: Muscle aches:  Diet compliance:  Healthy, veggies.. 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 Exercise: 5000 steps a day Other complaints:  Vit D: restart vit  D  Prediabetes: stable A1C 6   Thyroid: Stable levels.  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 [place] of carotids and renal.. Every 2 years.  Some change on kidney... followed by Dr. Holley Raring.  Social History /Family History/Past Medical History reviewed in detail and updated in EMR if needed. Blood pressure 120/70, pulse 72, temperature 98.1 F (36.7 C), temperature source Oral, height 5' 3.25" (1.607 m), weight 177 lb 4 oz (80.4 kg).   Review of Systems  Constitutional: Negative for fatigue and fever.  HENT: Negative for congestion.   Eyes: Negative for pain.  Respiratory: Negative for cough and shortness of breath.   Cardiovascular: Negative for chest pain, palpitations and leg swelling.  Gastrointestinal: Negative for abdominal pain.  Genitourinary: Negative for dysuria and vaginal bleeding.  Musculoskeletal: Negative for back pain.  Neurological: Negative for syncope, light-headedness and headaches.  Psychiatric/Behavioral: Negative for dysphoric mood.       Objective:   Physical Exam Constitutional:      General: She is not in acute distress.    Appearance: Normal appearance. She is well-developed. She is not ill-appearing or toxic-appearing.  HENT:     Head: Normocephalic.     Right Ear: Hearing, tympanic membrane, ear canal and external ear normal.     Left Ear: Hearing, tympanic membrane, ear canal and external ear normal.     Nose: Nose normal.  Eyes:  General: Lids are normal. Lids are everted, no foreign bodies appreciated.     Conjunctiva/sclera: Conjunctivae normal.     Pupils: Pupils are equal, round, and reactive to light.  Neck:     Musculoskeletal: Normal range of motion and neck supple.     Thyroid: No thyroid mass or thyromegaly.     Vascular: No carotid bruit.     Trachea: Trachea normal.  Cardiovascular:     Rate and Rhythm: Normal rate and regular rhythm.     Heart sounds: Normal heart sounds, S1 normal and S2 normal. No murmur. No  gallop.   Pulmonary:     Effort: Pulmonary effort is normal. No respiratory distress.     Breath sounds: Normal breath sounds. No wheezing, rhonchi or rales.  Abdominal:     General: Bowel sounds are normal. There is no distension or abdominal bruit.     Palpations: Abdomen is soft. There is no fluid wave or mass.     Tenderness: There is no abdominal tenderness. There is no guarding or rebound.     Hernia: No hernia is present.  Lymphadenopathy:     Cervical: No cervical adenopathy.  Skin:    General: Skin is warm and dry.     Findings: No rash.  Neurological:     Mental Status: She is alert.     Cranial Nerves: No cranial nerve deficit.     Sensory: No sensory deficit.  Psychiatric:        Mood and Affect: Mood is not anxious or depressed.        Speech: Speech normal.        Behavior: Behavior normal. Behavior is cooperative.        Judgment: Judgment normal.           Assessment & Plan:  The patient's preventative maintenance and recommended screening tests for an annual wellness exam were reviewed in full today. Brought up to date unless services declined.  Counselled on the importance of diet, exercise, and its role in overall health and mortality. The patient's FH and SH was reviewed, including their home life, tobacco status, and drug and alcohol status.   Vaccines: up to date Colon:10/2013, Dr. Fuller Plan, Repeat in 5 years... pt plan to schedule DVE/pap: total hysterectomy  Mammo: 07/2018  DEXA:06/2016 osteoporosis improved to osteopenia, she refuses bisphosphonate, she is taking vit D.  Plan in 2020 She is now sexually active. Not interested in STD testing.

## 2018-11-22 NOTE — Assessment & Plan Note (Signed)
Continue low carb diet and weight loss.

## 2018-11-22 NOTE — Patient Instructions (Addendum)
Start atorvastatin daily.  Call if any side effects. Work on low fat low cholesterol diet.

## 2018-11-24 ENCOUNTER — Encounter: Payer: Self-pay | Admitting: Gastroenterology

## 2018-11-28 ENCOUNTER — Telehealth: Payer: Self-pay | Admitting: Family Medicine

## 2018-11-28 DIAGNOSIS — I10 Essential (primary) hypertension: Secondary | ICD-10-CM | POA: Diagnosis not present

## 2018-11-28 DIAGNOSIS — I701 Atherosclerosis of renal artery: Secondary | ICD-10-CM | POA: Diagnosis not present

## 2018-11-28 DIAGNOSIS — R3129 Other microscopic hematuria: Secondary | ICD-10-CM | POA: Diagnosis not present

## 2018-11-28 MED ORDER — NIFEDIPINE ER OSMOTIC RELEASE 60 MG PO TB24: 60.0000 mg | ORAL_TABLET | Freq: Every day | ORAL | 3 refills | Status: DC

## 2018-11-28 MED ORDER — ATORVASTATIN CALCIUM 20 MG PO TABS: 20.0000 mg | ORAL_TABLET | Freq: Every day | ORAL | 3 refills | Status: DC

## 2018-11-28 MED ORDER — CHLORTHALIDONE 25 MG PO TABS: 25.0000 mg | ORAL_TABLET | Freq: Every day | ORAL | 3 refills | Status: DC

## 2018-11-28 MED ORDER — PANTOPRAZOLE SODIUM 40 MG PO TBEC: 40.0000 mg | DELAYED_RELEASE_TABLET | Freq: Every day | ORAL | 3 refills | Status: DC | PRN

## 2018-11-28 MED ORDER — METOPROLOL SUCCINATE ER 100 MG PO TB24: ORAL_TABLET | ORAL | 3 refills | Status: DC

## 2018-11-28 MED ORDER — KLOR-CON M20 20 MEQ PO TBCR: 20.0000 meq | EXTENDED_RELEASE_TABLET | Freq: Every day | ORAL | 3 refills | Status: DC

## 2018-11-28 NOTE — Telephone Encounter (Signed)
Refills on all of patient's medications sent to [facility] as requested.

## 2018-11-28 NOTE — Telephone Encounter (Signed)
Pt need electronic scrip of all her medications she take sent to [facility] fax to [phone]. For the purpose [facility] will pay for her medication. Please advise

## 2018-12-17 ENCOUNTER — Telehealth: Payer: Self-pay

## 2018-12-17 NOTE — Telephone Encounter (Signed)
Pt wants to know if can take atorvastatin at night. Advised pt can take atorvastatin at night if prefers.pt voiced understanding.pt had taken atorvastatin in morning with other meds and had heartburn; pt is going to try taking in the evening to see if still has heartburn. Pt will cb if needed. FYI to Dr Diona Browner.

## 2018-12-30 ENCOUNTER — Encounter: Payer: Self-pay | Admitting: Gastroenterology

## 2019-01-06 ENCOUNTER — Other Ambulatory Visit: Payer: Self-pay | Admitting: Family Medicine

## 2019-01-27 ENCOUNTER — Ambulatory Visit (AMBULATORY_SURGERY_CENTER): Payer: Self-pay | Admitting: *Deleted

## 2019-01-27 ENCOUNTER — Other Ambulatory Visit: Payer: Self-pay

## 2019-01-27 ENCOUNTER — Encounter: Payer: Self-pay | Admitting: Gastroenterology

## 2019-01-27 VITALS — Ht 63.0 in | Wt 178.8 lb

## 2019-01-27 DIAGNOSIS — Z86010 Personal history of colonic polyps: Secondary | ICD-10-CM

## 2019-01-27 MED ORDER — NA SULFATE-K SULFATE-MG SULF 17.5-3.13-1.6 GM/177ML PO SOLN: 1.0000 | Freq: Once | ORAL | 0 refills | Status: AC

## 2019-01-27 NOTE — Progress Notes (Signed)
No egg or soy allergy known to patient  No issues with past sedation with any surgeries  or procedures, no intubation problems  No diet pills per patient No home 02 use per patient  No blood thinners per patient  Pt denies issues with constipation  Daily- she uses stool softeners PRN only- maybe once a week vs 1 x every 2 weeks  No A fib or A flutter  EMMI video sent to pt's e mail   Pt states she has GERD s/s that causes her to occ vomit if eats greasy foods and she ? Having EGD. She does not wiah to change her colon date so she will discuss with Fuller Plan at colon and see if EGD needed

## 2019-02-10 ENCOUNTER — Encounter: Payer: Medicare Other | Admitting: Gastroenterology

## 2019-02-11 ENCOUNTER — Ambulatory Visit: Payer: Medicare Other | Admitting: Urology

## 2019-02-24 ENCOUNTER — Encounter (INDEPENDENT_AMBULATORY_CARE_PROVIDER_SITE_OTHER): Payer: Medicare Other

## 2019-02-24 ENCOUNTER — Ambulatory Visit (INDEPENDENT_AMBULATORY_CARE_PROVIDER_SITE_OTHER): Payer: Medicare Other | Admitting: Vascular Surgery

## 2019-02-24 ENCOUNTER — Encounter: Payer: Medicare Other | Admitting: Gastroenterology

## 2019-03-24 ENCOUNTER — Telehealth: Payer: Self-pay | Admitting: *Deleted

## 2019-03-24 NOTE — Telephone Encounter (Signed)
Called patient to reschedule previously cancelled colonoscopy due to Covid-19. Patient requests call back in June as she does not feel comfortable coming to a Dr's office in the month of May with Covid-19 restrictions just being lifted.

## 2019-03-28 ENCOUNTER — Ambulatory Visit: Payer: Medicare Other | Admitting: Podiatry

## 2019-03-31 ENCOUNTER — Telehealth: Payer: Self-pay | Admitting: *Deleted

## 2019-03-31 NOTE — Telephone Encounter (Signed)
Rescheduled patient 05/15/19 at 1000 am

## 2019-04-09 DIAGNOSIS — H0288A Meibomian gland dysfunction right eye, upper and lower eyelids: Secondary | ICD-10-CM | POA: Diagnosis not present

## 2019-04-09 DIAGNOSIS — H04123 Dry eye syndrome of bilateral lacrimal glands: Secondary | ICD-10-CM | POA: Diagnosis not present

## 2019-04-09 DIAGNOSIS — H0288B Meibomian gland dysfunction left eye, upper and lower eyelids: Secondary | ICD-10-CM | POA: Diagnosis not present

## 2019-04-09 DIAGNOSIS — H524 Presbyopia: Secondary | ICD-10-CM | POA: Diagnosis not present

## 2019-04-09 DIAGNOSIS — H16223 Keratoconjunctivitis sicca, not specified as Sjogren's, bilateral: Secondary | ICD-10-CM | POA: Diagnosis not present

## 2019-04-15 ENCOUNTER — Ambulatory Visit (INDEPENDENT_AMBULATORY_CARE_PROVIDER_SITE_OTHER): Payer: Medicare Other | Admitting: Family Medicine

## 2019-04-15 ENCOUNTER — Encounter: Payer: Self-pay | Admitting: Family Medicine

## 2019-04-15 VITALS — BP 119/65 | HR 75 | Temp 97.6°F | Ht 63.25 in

## 2019-04-15 DIAGNOSIS — R7303 Prediabetes: Secondary | ICD-10-CM | POA: Diagnosis not present

## 2019-04-15 DIAGNOSIS — R42 Dizziness and giddiness: Secondary | ICD-10-CM

## 2019-04-15 DIAGNOSIS — E0590 Thyrotoxicosis, unspecified without thyrotoxic crisis or storm: Secondary | ICD-10-CM | POA: Diagnosis not present

## 2019-04-15 DIAGNOSIS — E785 Hyperlipidemia, unspecified: Secondary | ICD-10-CM

## 2019-04-15 NOTE — Patient Instructions (Addendum)
Start nasocort 2 sprays per nostril daily x 1-2 weeks.  Shirlean Mylar will call to set up curbside labs.  Start home desensitization exercises.  Call if not improving as expected.\  Follow these steps to try Brandt-Daroff exercises:  Sit in the middle of a bed with your feet on the floor. Turn your head 45 degrees to the right. Without moving your head, lie down on your left side. Pause for 30 seconds. Return to the starting position. Pause for 30 seconds. Turn your head 45 degrees to the left. Repeat Steps 2 and 3 on the right side. Return to the starting position. Pause for 30 seconds. Complete one set of five repetitions on each side. Before standing up, wait for any dizziness to pass.  Benign Positional Vertigo Vertigo is the feeling that you or your surroundings are moving when they are not. Benign positional vertigo is the most common form of vertigo. This is usually a harmless condition (benign). This condition is positional. This means that symptoms are triggered by certain movements and positions. This condition can be dangerous if it occurs while you are doing something that could cause harm to you or others. This includes activities such as driving or operating machinery. What are the causes? In many cases, the cause of this condition is not known. It may be caused by a disturbance in an area of the inner ear that helps your brain to sense movement and balance. This disturbance can be caused by:  Viral infection (labyrinthitis).  Head injury.  Repetitive motion, such as jumping, dancing, or running. What increases the risk? You are more likely to develop this condition if:  You are a woman.  You are 65 years of age or older. What are the signs or symptoms? Symptoms of this condition usually happen when you move your head or your eyes in different directions. Symptoms may start suddenly, and usually last for less than a minute. They include:  Loss of balance and falling.   Feeling like you are spinning or moving.  Feeling like your surroundings are spinning or moving.  Nausea and vomiting.  Blurred vision.  Dizziness.  Involuntary eye movement (nystagmus). Symptoms can be mild and cause only minor problems, or they can be severe and interfere with daily life. Episodes of benign positional vertigo may return (recur) over time. Symptoms may improve over time. How is this diagnosed? This condition may be diagnosed based on:  Your medical history.  Physical exam of the head, neck, and ears.  Tests, such as: ? MRI. ? CT scan. ? Eye movement tests. Your health care provider may ask you to change positions quickly while he or she watches you for symptoms of benign positional vertigo, such as nystagmus. Eye movement may be tested with a variety of exams that are designed to evaluate or stimulate vertigo. ? An electroencephalogram (EEG). This records electrical activity in your brain. ? Hearing tests. You may be referred to a health care provider who specializes in ear, nose, and throat (ENT) problems (otolaryngologist) or a provider who specializes in disorders of the nervous system (neurologist). How is this treated?  This condition may be treated in a session in which your health care provider moves your head in specific positions to adjust your inner ear back to normal. Treatment for this condition may take several sessions. Surgery may be needed in severe cases, but this is rare. In some cases, benign positional vertigo may resolve on its own in 2-4 weeks. Follow these instructions  at home: Safety  Move slowly. Avoid sudden body or head movements or certain positions, as told by your health care provider.  Avoid driving until your health care provider says it is safe for you to do so.  Avoid operating heavy machinery until your health care provider says it is safe for you to do so.  Avoid doing any tasks that would be dangerous to you or others if  vertigo occurs.  If you have trouble walking or keeping your balance, try using a cane for stability. If you feel dizzy or unstable, sit down right away.  Return to your normal activities as told by your health care provider. Ask your health care provider what activities are safe for you. General instructions  Take over-the-counter and prescription medicines only as told by your health care provider.  Drink enough fluid to keep your urine pale yellow.  Keep all follow-up visits as told by your health care provider. This is important. Contact a health care provider if:  You have a fever.  Your condition gets worse or you develop new symptoms.  Your family or friends notice any behavioral changes.  You have nausea or vomiting that gets worse.  You have numbness or a "pins and needles" sensation. Get help right away if you:  Have difficulty speaking or moving.  Are always dizzy.  Faint.  Develop severe headaches.  Have weakness in your legs or arms.  Have changes in your hearing or vision.  Develop a stiff neck.  Develop sensitivity to light. Summary  Vertigo is the feeling that you or your surroundings are moving when they are not. Benign positional vertigo is the most common form of vertigo.  The cause of this condition is not known. It may be caused by a disturbance in an area of the inner ear that helps your brain to sense movement and balance.  Symptoms include loss of balance and falling, feeling that you or your surroundings are moving, nausea and vomiting, and blurred vision.  This condition can be diagnosed based on symptoms, physical exam, and other tests, such as MRI, CT scan, eye movement tests, and hearing tests.  Follow safety instructions as told by your health care provider. You will also be told when to contact your health care provider in case of problems. This information is not intended to replace advice given to you by your health care provider.  Make sure you discuss any questions you have with your health care provider. Document Released: 08/14/2006 Document Revised: 04/17/2018 Document Reviewed: 04/17/2018 Elsevier Interactive Patient Education  2019 [employer].

## 2019-04-15 NOTE — Assessment & Plan Note (Signed)
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  Start nasal steroid 2 sprays per nostril for possible fluid behind TM and allergies. Start home desensitization exercise.  Eval with labs to rule out secondary cause... ahs been low on potassium in past given chlorthalidone.

## 2019-04-15 NOTE — Progress Notes (Signed)
VIRTUAL VISIT Due to national recommendations of social distancing due to [place] 19, a virtual visit is felt to be most appropriate for this patient at this time.   I connected with the patient on 04/15/19 at 11:15 AM EDT by virtual telehealth platform and verified that I am speaking with the correct person using two identifiers.   I discussed the limitations, risks, security and privacy concerns of performing an evaluation and management service by  virtual telehealth platform and the availability of in person appointments. I also discussed with the patient that there may be a patient responsible charge related to this service. The patient expressed understanding and agreed to proceed.  Patient location: Home Provider Location: [place] Participants: Eliezer Lofts and [name]   Chief Complaint  Patient presents with  . Dizziness    on and off x 1 year    History of Present Illness: 78 year old female with history of HTN, fibromyscular dysplasia/renal artery stenosis, prediabetes, anxiety presents with dizziness off and on in last year.   She reports that yesterday when she woke up , laying down.. had room spinning, dry mouth. Lasted 1-2 hours very severe... but was woozy for rest of the day.  Worse with movign head or rolling over in bed.  No nausea.  No headache.  Temp was normal, BP was 115/75, HR 75.  drank water.  Has not been taking potassium so ate a bannana.  Has been some what tired.   On chlorthalidone, nifedipine and metoprolol for HTN.   No recent med changes.  She has  been taking nasocort one per nostril  Last week.. sneeziong more than usual.  BP Readings from Last 3 Encounters:  04/15/19 119/65  11/22/18 120/70  11/05/18 136/80   Wt Readings from Last 3 Encounters:  01/27/19 178 lb 12.8 oz (81.1 kg)  11/22/18 177 lb 4 oz (80.4 kg)  11/05/18 177 lb 4 oz (80.4 kg)      COVID 19 screen No recent travel or known exposure to COVID19 The  patient denies respiratory symptoms of COVID 19 at this time.  The importance of social distancing was discussed today.   Review of Systems  Constitutional: Negative for chills and fever.  HENT: Negative for congestion and ear pain.   Eyes: Negative for pain and redness.  Respiratory: Negative for cough and shortness of breath.   Cardiovascular: Negative for chest pain, palpitations and leg swelling.  Gastrointestinal: Negative for abdominal pain, blood in stool, constipation, diarrhea, nausea and vomiting.  Genitourinary: Negative for dysuria.  Musculoskeletal: Negative for falls and myalgias.  Skin: Negative for rash.  Neurological: Negative for dizziness.  Psychiatric/Behavioral: Negative for depression. The patient is not nervous/anxious.       Past Medical History:  Diagnosis Date  . Allergy   . 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 Hulda Humphrey of vascular surgery in [place]  . Arthritis    rt knee, foot  . Cataract    forming right eye   . 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 Constipation    uses stool softener PRN- uses once a week to once every 2 weeks   . Fibromuscular dysplasia ([place])   . GERD (gastroesophageal reflux disease)   . Hyperlipidemia   . Hypertension    For 16 years; urinary catecholamines within normal limits 12/09; renal Doppler ultrasound showed no evidence for  renal artery stenosis  . Normal echocardiogram 4/10   LVEF 65%; no regional wall motion abnormalities; normal RV size and function; pulmonic valve had increased gradient across w/ peak gradient of about 36 mmHg (range of moderate pulmonic stenosis) followup showed normal valve  . Papilloma of breast    right  . Tinnitus of left ear     reports that she quit smoking about 36 years ago. She has never used smokeless tobacco. She reports that she does not drink alcohol or use drugs.   Current  Outpatient Medications:  .  aspirin 81 MG chewable tablet, Chew by mouth daily., Disp: , Rfl:  .  atorvastatin (LIPITOR) 20 MG tablet, Take 1 tablet (20 mg total) by mouth daily., Disp: 90 tablet, Rfl: 3 .  chlorthalidone (HYGROTON) 25 MG tablet, Take 1 tablet (25 mg total) by mouth daily., Disp: 90 tablet, Rfl: 3 .  cholecalciferol (VITAMIN D) 1000 units tablet, Take 2,000 Units by mouth daily., Disp: , Rfl:  .  KLOR-CON M20 20 MEQ tablet, Take 1 tablet (20 mEq total) by mouth daily., Disp: 90 tablet, Rfl: 3 .  metoprolol succinate (TOPROL-XL) 100 MG 24 hr tablet, TAKE 1 TABLET BY MOUTH DAILY WITH OR IMMEDIATELY FOLLOWING A MEAL., Disp: 90 tablet, Rfl: 3 .  NIFEdipine (PROCARDIA XL/NIFEDICAL XL) 60 MG 24 hr tablet, Take 1 tablet (60 mg total) by mouth daily., Disp: 90 tablet, Rfl: 3 .  pantoprazole (PROTONIX) 40 MG tablet, Take 1 tablet (40 mg total) by mouth daily as needed., Disp: 90 tablet, Rfl: 3 .  Wheat Dextrin (BENEFIBER DRINK MIX PO), Take by mouth 2 (two) times daily as needed., Disp: , Rfl:    Observations/Objective: Blood pressure 119/65, pulse 75, temperature 97.6 F (36.4 C), temperature source Oral, height 5' 3.25" (1.607 m).  Physical Exam  Physical Exam Constitutional:      General: The patient is not in acute distress. Pulmonary:     Effort: Pulmonary effort is normal. No respiratory distress.  Neurological:     Mental Status: The patient is alert and oriented to person, place, and time.  Psychiatric:        Mood and Affect: Mood normal.        Behavior: Behavior normal.   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  Start nasal steroid 2 sprays per nostril for possible fluid behind TM and allergies. Start home desensitization exercise.  Eval with labs to rule out secondary cause... ahs been low on potassium in past given chlorthalidone.     I discussed the assessment and treatment plan with the patient. The patient was provided an opportunity to ask questions  and all were answered. The patient agreed with the plan and demonstrated an understanding of the instructions.   The patient was advised to call back or seek an in-person evaluation if the symptoms worsen or if the condition fails to improve as anticipated.     Eliezer Lofts, MD

## 2019-04-16 NOTE — Addendum Note (Signed)
Addended by: Ellamae Sia on: 04/16/2019 11:50 AM   Modules accepted: Orders

## 2019-04-16 NOTE — Progress Notes (Signed)
Labs 5/28 Pt aware

## 2019-04-17 ENCOUNTER — Other Ambulatory Visit (INDEPENDENT_AMBULATORY_CARE_PROVIDER_SITE_OTHER): Payer: Medicare Other

## 2019-04-17 DIAGNOSIS — R42 Dizziness and giddiness: Secondary | ICD-10-CM | POA: Diagnosis not present

## 2019-04-17 DIAGNOSIS — E785 Hyperlipidemia, unspecified: Secondary | ICD-10-CM | POA: Diagnosis not present

## 2019-04-17 DIAGNOSIS — E0590 Thyrotoxicosis, unspecified without thyrotoxic crisis or storm: Secondary | ICD-10-CM | POA: Diagnosis not present

## 2019-04-17 DIAGNOSIS — R7303 Prediabetes: Secondary | ICD-10-CM | POA: Diagnosis not present

## 2019-04-17 LAB — TSH: TSH: 2.67 u[IU]/mL (ref 0.35–4.50)

## 2019-04-17 LAB — LIPID PANEL
Cholesterol: 157 mg/dL (ref 0–200)
HDL: 45.5 mg/dL (ref >39.00)
LDL Cholesterol: 85 mg/dL (ref 0–99)
NonHDL: 111.02
Total CHOL/HDL Ratio: 3
Triglycerides: 131 mg/dL (ref 0.0–149.0)
VLDL: 26.2 mg/dL (ref 0.0–40.0)

## 2019-04-17 LAB — CBC WITH DIFFERENTIAL/PLATELET
Basophils Absolute: 0 10*3/uL (ref 0.0–0.1)
Basophils Relative: 0.3 % (ref 0.0–3.0)
Eosinophils Absolute: 0.1 10*3/uL (ref 0.0–0.7)
Eosinophils Relative: 1.8 % (ref 0.0–5.0)
HCT: 39 % (ref 36.0–46.0)
Hemoglobin: 13.6 g/dL (ref 12.0–15.0)
Lymphocytes Relative: 41 % (ref 12.0–46.0)
Lymphs Abs: 3.3 10*3/uL (ref 0.7–4.0)
MCHC: 34.8 g/dL (ref 30.0–36.0)
MCV: 95.1 fl (ref 78.0–100.0)
Monocytes Absolute: 0.6 10*3/uL (ref 0.1–1.0)
Monocytes Relative: 6.9 % (ref 3.0–12.0)
Neutro Abs: 4 10*3/uL (ref 1.4–7.7)
Neutrophils Relative %: 50 % (ref 43.0–77.0)
Platelets: 315 10*3/uL (ref 150.0–400.0)
RBC: 4.11 Mil/uL (ref 3.87–5.11)
RDW: 13.4 % (ref 11.5–15.5)
WBC: 8.1 10*3/uL (ref 4.0–10.5)

## 2019-04-17 LAB — COMPREHENSIVE METABOLIC PANEL
ALT: 13 U/L (ref 0–35)
AST: 14 U/L (ref 0–37)
Albumin: 4.1 g/dL (ref 3.5–5.2)
Alkaline Phosphatase: 58 U/L (ref 39–117)
BUN: 17 mg/dL (ref 6–23)
CO2: 29 mEq/L (ref 19–32)
Calcium: 9.7 mg/dL (ref 8.4–10.5)
Chloride: 102 mEq/L (ref 96–112)
Creatinine, Ser: 0.73 mg/dL (ref 0.40–1.20)
GFR: 93.82 mL/min (ref >60.00)
Glucose, Bld: 119 mg/dL — ABNORMAL HIGH (ref 70–99)
Potassium: 3.4 mEq/L — ABNORMAL LOW (ref 3.5–5.1)
Sodium: 140 mEq/L (ref 135–145)
Total Bilirubin: 0.3 mg/dL (ref 0.2–1.2)
Total Protein: 7.2 g/dL (ref 6.0–8.3)

## 2019-04-17 LAB — VITAMIN B12: Vitamin B-12: 161 pg/mL — ABNORMAL LOW (ref 211–911)

## 2019-04-17 LAB — T3, FREE: T3, Free: 3.1 pg/mL (ref 2.3–4.2)

## 2019-04-17 LAB — T4, FREE: Free T4: 0.77 ng/dL (ref 0.60–1.60)

## 2019-04-17 LAB — HEMOGLOBIN A1C: Hgb A1c MFr Bld: 6.3 % (ref 4.6–6.5)

## 2019-05-05 ENCOUNTER — Encounter (INDEPENDENT_AMBULATORY_CARE_PROVIDER_SITE_OTHER): Payer: Self-pay | Admitting: Vascular Surgery

## 2019-05-05 ENCOUNTER — Ambulatory Visit (INDEPENDENT_AMBULATORY_CARE_PROVIDER_SITE_OTHER): Payer: Medicare Other | Admitting: Vascular Surgery

## 2019-05-05 ENCOUNTER — Ambulatory Visit (INDEPENDENT_AMBULATORY_CARE_PROVIDER_SITE_OTHER): Payer: Medicare Other

## 2019-05-05 ENCOUNTER — Other Ambulatory Visit: Payer: Self-pay

## 2019-05-05 VITALS — BP 154/83 | HR 71 | Resp 16 | Wt 177.6 lb

## 2019-05-05 DIAGNOSIS — I6521 Occlusion and stenosis of right carotid artery: Secondary | ICD-10-CM | POA: Diagnosis not present

## 2019-05-05 DIAGNOSIS — I701 Atherosclerosis of renal artery: Secondary | ICD-10-CM | POA: Diagnosis not present

## 2019-05-05 DIAGNOSIS — I6523 Occlusion and stenosis of bilateral carotid arteries: Secondary | ICD-10-CM

## 2019-05-05 DIAGNOSIS — Z79899 Other long term (current) drug therapy: Secondary | ICD-10-CM

## 2019-05-05 DIAGNOSIS — E782 Mixed hyperlipidemia: Secondary | ICD-10-CM | POA: Diagnosis not present

## 2019-05-05 DIAGNOSIS — K219 Gastro-esophageal reflux disease without esophagitis: Secondary | ICD-10-CM

## 2019-05-05 DIAGNOSIS — I10 Essential (primary) hypertension: Secondary | ICD-10-CM | POA: Diagnosis not present

## 2019-05-05 DIAGNOSIS — Z87891 Personal history of nicotine dependence: Secondary | ICD-10-CM

## 2019-05-05 DIAGNOSIS — Z7902 Long term (current) use of antithrombotics/antiplatelets: Secondary | ICD-10-CM

## 2019-05-05 NOTE — Progress Notes (Signed)
MRN : [id number]  Kristy [name] is a 78 y.o. (09-03-1941) female who presents with chief complaint of  Chief Complaint  Patient presents with  . Follow-up    ultrasound follow up  .  History of Present Illness:   The patient returns to the office for followup and review of the noninvasive studies regarding renal vascular hypertension and renal artery stenosis. There have been no interval changes in the patient's blood pressure control.  He denies any major changes in is medications.  The patient denies headache or flushing.  No flank or unusual back pain.    There have been no significant changes to the patient's overall health care.  No interval shortening of the patient's walking distance or new symptoms consistent with claudication.  The patient denies the  development of rest pain symptoms. No new ulcers or wounds have occurred since the last visit.  The patient denies amaurosis fugax or recent TIA symptoms. There are no recent neurological changes noted. The patient denies history of DVT, PE or superficial thrombophlebitis. The patient denies recent episodes of angina or shortness of breath.   Duplex ultrasound of the renal arteries shows 30-50% of the right renal artery  Duplex ultrasound of the carotid arteries <30% stenosis bilaterally  Current Meds  Medication Sig  . aspirin 81 MG chewable tablet Chew by mouth daily.  Marland Kitchen atorvastatin (LIPITOR) 20 MG tablet Take 1 tablet (20 mg total) by mouth daily.  . chlorthalidone (HYGROTON) 25 MG tablet Take 1 tablet (25 mg total) by mouth daily.  . cholecalciferol (VITAMIN D) 1000 units tablet Take 2,000 Units by mouth daily.  . cyanocobalamin 1000 MCG tablet Take 1,000 mcg by mouth daily.  Marland Kitchen KLOR-CON M20 20 MEQ tablet Take 1 tablet (20 mEq total) by mouth daily.  . metoprolol succinate (TOPROL-XL) 100 MG 24 hr tablet TAKE 1 TABLET BY MOUTH DAILY WITH OR IMMEDIATELY FOLLOWING A MEAL.  Marland Kitchen NIFEdipine (PROCARDIA XL/NIFEDICAL  XL) 60 MG 24 hr tablet Take 1 tablet (60 mg total) by mouth daily.  . pantoprazole (PROTONIX) 40 MG tablet Take 1 tablet (40 mg total) by mouth daily as needed.  . Wheat Dextrin (BENEFIBER DRINK MIX PO) Take by mouth 2 (two) times daily as needed.    Past Medical History:  Diagnosis Date  . Allergy   . 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 Hulda Humphrey of vascular surgery in [place]  . Arthritis    rt knee, foot  . Cataract    forming right eye   . 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 Constipation    uses stool softener PRN- uses once a week to once every 2 weeks   . Fibromuscular dysplasia ([place])   . GERD (gastroesophageal reflux disease)   . Hyperlipidemia   . Hypertension    For 16 years; urinary catecholamines within normal limits 12/09; renal Doppler ultrasound showed no evidence for renal artery stenosis  . Normal echocardiogram 4/10   LVEF 65%; no regional wall motion abnormalities; normal RV size and function; pulmonic valve had increased gradient across w/ peak gradient of about 36 mmHg (range of moderate pulmonic stenosis) followup showed normal valve  . Papilloma of breast    right  . Tinnitus of left ear     Past Surgical History:  Procedure Laterality Date  . ANGIOPLASTY     2012  . BREAST BIOPSY  Right 2006   benign  . BREAST EXCISIONAL BIOPSY Right 07/2017   benign  . BREAST LUMPECTOMY WITH RADIOACTIVE SEED LOCALIZATION Right 08/14/2017   Procedure: RIGHT BREAST LUMPECTOMY WITH RADIOACTIVE SEED LOCALIZATION;  Surgeon: [name], MD;  Location: [facility];  Service: General;  Laterality: Right;  . Cardiolyte  11/07   Neg  . COLONOSCOPY    . KNEE CARTILAGE SURGERY     right knee  . POLYPECTOMY    . TOTAL ABDOMINAL HYSTERECTOMY     no cervix  . UPPER GASTROINTESTINAL ENDOSCOPY      Social History Social History   Tobacco Use  .  Smoking status: Former Smoker    Quit date: 11/20/1982    Years since quitting: 36.4  . Smokeless tobacco: Never Used  . Tobacco comment: 15 pack year history  Substance Use Topics  . Alcohol use: No  . Drug use: No    Family History Family History  Problem Relation Age of Onset  . Prostate cancer Father   . Colon cancer Maternal Uncle   . Colon cancer Paternal Uncle   . Colon polyps Sister   . Coronary artery disease Paternal Grandmother   . Goiter Other        ?  . Esophageal cancer Neg Hx   . Stomach cancer Neg Hx   . Rectal cancer Neg Hx   . Bladder Cancer Neg Hx   . Kidney cancer Neg Hx     No Known Allergies   REVIEW OF SYSTEMS (Negative unless checked)  Constitutional: [] Weight loss  [] Fever  [] Chills Cardiac: [] Chest pain   [] Chest pressure   [] Palpitations   [] Shortness of breath when laying flat   [] Shortness of breath with exertion. Vascular:  [] Pain in legs with walking   [] Pain in legs at rest  [] History of DVT   [] Phlebitis   [] Swelling in legs   [] Varicose veins   [] Non-healing ulcers Pulmonary:   [] Uses home oxygen   [] Productive cough   [] Hemoptysis   [] Wheeze  [] COPD   [] Asthma Neurologic:  [] Dizziness   [] Seizures   [] History of stroke   [] History of TIA  [] Aphasia   [] Vissual changes   [] Weakness or numbness in arm   [] Weakness or numbness in leg Musculoskeletal:   [] Joint swelling   [] Joint pain   [] Low back pain Hematologic:  [] Easy bruising  [] Easy bleeding   [] Hypercoagulable state   [] Anemic Gastrointestinal:  [] Diarrhea   [] Vomiting  [] Gastroesophageal reflux/heartburn   [] Difficulty swallowing. Genitourinary:  [] Chronic kidney disease   [] Difficult urination  [] Frequent urination   [] Blood in urine Skin:  [] Rashes   [] Ulcers  Psychological:  [] History of anxiety   []  History of major depression.  Physical Examination  Vitals:   05/05/19 1115 05/05/19 1116  BP: (!) 145/78 (!) 154/83  Pulse: 71   Resp: 16   Weight: 177 lb 9.6 oz (80.6 kg)     Body mass index is 31.21 kg/m. Gen: WD/WN, NAD Head: [place]/AT, No temporalis wasting.  Ear/Nose/Throat: Hearing grossly intact, nares w/o erythema or drainage Eyes: PER, EOMI, sclera nonicteric.  Neck: Supple, no large masses.   Pulmonary:  Good air movement, no audible wheezing bilaterally, no use of accessory muscles.  Cardiac: RRR, no JVD Vascular:  Vessel Right Left  Radial Palpable Palpable  PT Palpable Palpable  DP Palpable Palpable  Gastrointestinal: Non-distended. No guarding/no peritoneal signs.  Musculoskeletal: M/S 5/5 throughout.  No deformity or atrophy.  Neurologic: CN 2-12 intact. Symmetrical.  Speech is fluent. Motor exam as listed above. Psychiatric: Judgment intact, Mood & affect appropriate for pt's clinical situation. Dermatologic: No rashes or ulcers noted.  No changes consistent with cellulitis. Lymph : No lichenification or skin changes of chronic lymphedema.  CBC Lab Results  Component Value Date   WBC 8.1 04/17/2019   HGB 13.6 04/17/2019   HCT 39.0 04/17/2019   MCV 95.1 04/17/2019   PLT 315.0 04/17/2019    BMET    Component Value Date/Time   NA 140 04/17/2019 1009   K 3.4 (L) 04/17/2019 1009   CL 102 04/17/2019 1009   CO2 29 04/17/2019 1009   GLUCOSE 119 (H) 04/17/2019 1009   BUN 17 04/17/2019 1009   CREATININE 0.73 04/17/2019 1009   CALCIUM 9.7 04/17/2019 1009   GFRNONAA >60 08/09/2017 1300   GFRAA >60 08/09/2017 1300   Estimated Creatinine Clearance: 61.4 mL/min (by C-G formula based on SCr of 0.73 mg/dL).  COAG No results found for: INR, PROTIME  Radiology No results found.   Assessment/Plan 1. Renal artery stenosis (HCC) Given patient's arterial disease optimal control of the patient's hypertension is important. BP is acceptable today  The patient's vital signs and noninvasive studies support the renal artery stenosis is not significantly increased when compared to the previous study.  No invasive studies or intervention is  indicated at this time.  The patient will continue the current antihypertensive medications, no changes at this time.  The primary medical service will continue aggressive antihypertensive therapy as per the AHA guidelines  - VAS US RENAL ARTERY DUPLEX; Future  2. Bilateral carotid artery stenosis Recommend:  Given the patient's asymptomatic subcritical stenosis no further invasive testing or surgery at this time.  Duplex ultrasound shows <40% stenosis bilaterally.  Continue antiplatelet therapy as prescribed Continue management of CAD, HTN and Hyperlipidemia Healthy heart diet,  encouraged exercise at least 4 times per week Follow up in 2 months with duplex ultrasound and physical exam   - VAS US CAROTID; Future  3. HYPERTENSION, BENIGN ESSENTIAL, LABILE Continue antihypertensive medications as already ordered, these medications have been reviewed and there are no changes at this time.   4. Gastroesophageal reflux disease without esophagitis Continue PPI as already ordered, this medication has been reviewed and there are no changes at this time.  Avoidence of caffeine and alcohol  Moderate elevation of the head of the bed   5. Mixed hyperlipidemia Continue statin as ordered and reviewed, no changes at this time     Hortencia Pilar, MD  05/05/2019 11:21 AM

## 2019-05-11 ENCOUNTER — Encounter (INDEPENDENT_AMBULATORY_CARE_PROVIDER_SITE_OTHER): Payer: Self-pay | Admitting: Vascular Surgery

## 2019-05-14 ENCOUNTER — Telehealth: Payer: Self-pay | Admitting: Gastroenterology

## 2019-05-14 NOTE — Telephone Encounter (Signed)
Spoke w/patient regarding Covid-19 screening questions °Covid-19 Screening Questions: ° °Do you now or have you had a fever in the last 14 days? no ° °Do you have any respiratory symptoms of shortness of breath or cough now or in the last 14 days? no ° °Do you have any family members or close contacts with diagnosed or suspected Covid-19 in the past 14 days? no ° °Have you been tested for Covid-19 and found to be positive? no ° °Pt made aware of that care partner may wait in the car or come up to the lobby during the procedure but will need to provide their own mask. °

## 2019-05-15 ENCOUNTER — Ambulatory Visit (AMBULATORY_SURGERY_CENTER): Payer: Medicare Other | Admitting: Gastroenterology

## 2019-05-15 ENCOUNTER — Encounter: Payer: Self-pay | Admitting: Gastroenterology

## 2019-05-15 ENCOUNTER — Other Ambulatory Visit: Payer: Self-pay

## 2019-05-15 VITALS — BP 132/71 | HR 58 | Temp 99.0°F | Resp 15 | Ht 63.0 in | Wt 178.0 lb

## 2019-05-15 DIAGNOSIS — D122 Benign neoplasm of ascending colon: Secondary | ICD-10-CM | POA: Diagnosis not present

## 2019-05-15 DIAGNOSIS — D125 Benign neoplasm of sigmoid colon: Secondary | ICD-10-CM | POA: Diagnosis not present

## 2019-05-15 DIAGNOSIS — Z86010 Personal history of colonic polyps: Secondary | ICD-10-CM | POA: Diagnosis not present

## 2019-05-15 DIAGNOSIS — D123 Benign neoplasm of transverse colon: Secondary | ICD-10-CM | POA: Diagnosis not present

## 2019-05-15 DIAGNOSIS — Z860101 Personal history of adenomatous and serrated colon polyps: Secondary | ICD-10-CM

## 2019-05-15 MED ORDER — SODIUM CHLORIDE 0.9 % IV SOLN: 500.0000 mL | Freq: Once | INTRAVENOUS | Status: DC

## 2019-05-15 NOTE — Op Note (Signed)
[facility] Patient Name: Kristy [name] Procedure Date: 05/15/2019 10:29 AM MRN: [id number] Endoscopist: Ladene Artist , MD Age: 78 Referring MD:  Date of Birth: 03/04/1941 Gender: Female Account #: [id number] Procedure:                Colonoscopy Indications:              Surveillance: Personal history of adenomatous                            polyps on last colonoscopy > 5 years ago Medicines:                Monitored Anesthesia Care Procedure:                Pre-Anesthesia Assessment:                           - Prior to the procedure, a History and Physical                            was performed, and patient medications and                            allergies were reviewed. The patient's tolerance of                            previous anesthesia was also reviewed. The risks                            and benefits of the procedure and the sedation                            options and risks were discussed with the patient.                            All questions were answered, and informed consent                            was obtained. Prior Anticoagulants: The patient has                            taken no previous anticoagulant or antiplatelet                            agents. ASA Grade Assessment: II - A patient with                            mild systemic disease. After reviewing the risks                            and benefits, the patient was deemed in                            satisfactory condition to undergo the procedure.  After obtaining informed consent, the colonoscope                            was passed under direct vision. Throughout the                            procedure, the patient's blood pressure, pulse, and                            oxygen saturations were monitored continuously. The                            Colonoscope was introduced through the anus and                            advanced to the the  cecum, identified by                            appendiceal orifice and ileocecal valve. The                            ileocecal valve, appendiceal orifice, and rectum                            were photographed. The quality of the bowel                            preparation was good. The patient tolerated the                            procedure well. The colonoscopy was somewhat                            difficult due to restricted mobility of the sigmoid                            colon, a redundant colon and a tortuous colon. Scope In: 10:37:42 AM Scope Out: 11:12:16 AM Scope Withdrawal Time: 0 hours 22 minutes 54 seconds  Total Procedure Duration: 0 hours 34 minutes 34 seconds  Findings:                 The perianal and digital rectal examinations were                            normal.                           A 10 mm polyp was found in the transverse colon.                            The polyp was sessile. The polyp was removed with a                            hot snare. Resection and retrieval were complete.  Nine sessile polyps were found in the sigmoid colon                            (4), transverse colon (4) and ascending colon (1).                            The polyps were 6 to 8 mm in size. These polyps                            were removed with a cold snare. Resection and                            retrieval were complete.                           A few small-mouthed diverticula were found in the                            left colon. There was no evidence of diverticular                            bleeding.                           Internal hemorrhoids were found during                            retroflexion. The hemorrhoids were small and Grade                            I (internal hemorrhoids that do not prolapse).                           The exam was otherwise without abnormality on                            direct and  retroflexion views. Complications:            No immediate complications. Estimated blood loss:                            None. Estimated Blood Loss:     Estimated blood loss: none. Impression:               - One 10 mm polyp in the transverse colon, removed                            with a hot snare. Resected and retrieved.                           - Nine 6 to 8 mm polyps in the sigmoid colon, in                            the transverse colon and in the ascending colon,  removed with a cold snare. Resected and retrieved.                           - Mild diverticulosis in the left colon.                           - Internal hemorrhoids.                           - The examination was otherwise normal on direct                            and retroflexion views. Recommendation:           - Repeat colonoscopy date to be determined after                            pending pathology results are reviewed for                            surveillance.                           - Patient has a contact number available for                            emergencies. The signs and symptoms of potential                            delayed complications were discussed with the                            patient. Return to normal activities tomorrow.                            Written discharge instructions were provided to the                            patient.                           - Resume previous diet.                           - Continue present medications.                           - Await pathology results.                           - No aspirin, ibuprofen, naproxen, or other                            non-steroidal anti-inflammatory drugs for 2 weeks                            after polyp removal. Ladene Artist, MD  05/15/2019 11:18:47 AM This report has been signed electronically.

## 2019-05-15 NOTE — Patient Instructions (Signed)
Handouts given for polyps, diverticulosis and hemorrhoids.  No aspirin,ibuprofen naproxen or other NSAIDS for 2 weeks.  You may use Tylenol if needed.  YOU HAD AN ENDOSCOPIC PROCEDURE TODAY AT [facility]:   Refer to the procedure report that was given to you for any specific questions about what was found during the examination.  If the procedure report does not answer your questions, please call your gastroenterologist to clarify.  If you requested that your care partner not be given the details of your procedure findings, then the procedure report has been included in a sealed envelope for you to review at your convenience later.  YOU SHOULD EXPECT: Some feelings of bloating in the abdomen. Passage of more gas than usual.  Walking can help get rid of the air that was put into your GI tract during the procedure and reduce the bloating. If you had a lower endoscopy (such as a colonoscopy or flexible sigmoidoscopy) you may notice spotting of blood in your stool or on the toilet paper. If you underwent a bowel prep for your procedure, you may not have a normal bowel movement for a few days.  Please Note:  You might notice some irritation and congestion in your nose or some drainage.  This is from the oxygen used during your procedure.  There is no need for concern and it should clear up in a day or so.  SYMPTOMS TO REPORT IMMEDIATELY:   Following lower endoscopy (colonoscopy or flexible sigmoidoscopy):  Excessive amounts of blood in the stool  Significant tenderness or worsening of abdominal pains  Swelling of the abdomen that is new, acute  Fever of 100F or higher  For urgent or emergent issues, a gastroenterologist can be reached at any hour by calling [phone].   DIET:  We do recommend a small meal at first, but then you may proceed to your regular diet.  Drink plenty of fluids but you should avoid alcoholic beverages for 24 hours.  ACTIVITY:  You should plan to take  it easy for the rest of today and you should NOT DRIVE or use heavy machinery until tomorrow (because of the sedation medicines used during the test).    FOLLOW UP: Our staff will call the number listed on your records 48-72 hours following your procedure to check on you and address any questions or concerns that you may have regarding the information given to you following your procedure. If we do not reach you, we will leave a message.  We will attempt to reach you two times.  During this call, we will ask if you have developed any symptoms of COVID 19. If you develop any symptoms (ie: fever, flu-like symptoms, shortness of breath, cough etc.) before then, please call [phone].  If you test positive for Covid 19 in the 2 weeks post procedure, please call and report this information to [place].    If any biopsies were taken you will be contacted by phone or by letter within the next 1-3 weeks.  Please call us at [phone] if you have not heard about the biopsies in 3 weeks.    SIGNATURES/CONFIDENTIALITY: You and/or your care partner have signed paperwork which will be entered into your electronic medical record.  These signatures attest to the fact that that the information above on your After Visit Summary has been reviewed and is understood.  Full responsibility of the confidentiality of this discharge information lies with you and/or your care-partner.

## 2019-05-15 NOTE — Progress Notes (Signed)
Kristy [name]- temp Kristy [name]- vitals 

## 2019-05-15 NOTE — Progress Notes (Signed)
PT taken to PACU. Monitors in place. VSS. Report given to RN. 

## 2019-05-19 ENCOUNTER — Telehealth: Payer: Self-pay | Admitting: *Deleted

## 2019-05-19 NOTE — Telephone Encounter (Signed)
1. Have you developed a fever since your procedure? no  2.   Have you had an respiratory symptoms (SOB or cough) since your procedure? no  3.   Have you tested positive for COVID 19 since your procedure no  4.   Have you had any family members/close contacts diagnosed with the COVID 19 since your procedure?  no   If yes to any of these questions please route to Joylene John, RN and Alphonsa Gin, Therapist, sports.  Follow up Call-  Call back number 05/15/2019  Post procedure Call Back phone  # [phone]  Permission to leave phone message Yes  Some recent data might be hidden     Patient questions:  Do you have a fever, pain , or abdominal swelling? No. Pain Score  0 *  Have you tolerated food without any problems? Yes.    Have you been able to return to your normal activities? Yes.    Do you have any questions about your discharge instructions: Diet   No. Medications  No. Follow up visit  No.  Do you have questions or concerns about your Care? No.  Actions: * If pain score is 4 or above: No action needed, pain <4.

## 2019-05-21 ENCOUNTER — Encounter: Payer: Self-pay | Admitting: Gastroenterology

## 2019-07-03 ENCOUNTER — Telehealth: Payer: Self-pay | Admitting: Family Medicine

## 2019-07-03 DIAGNOSIS — Z1231 Encounter for screening mammogram for malignant neoplasm of breast: Secondary | ICD-10-CM

## 2019-07-03 DIAGNOSIS — E348 Other specified endocrine disorders: Secondary | ICD-10-CM

## 2019-07-03 NOTE — Telephone Encounter (Signed)
Pt called in and needs an order for a mammogram and bone density sent to [facility] at [facility] so they can call her to schedule her appt for Oct.   Thanks.

## 2019-07-16 ENCOUNTER — Telehealth: Payer: Self-pay

## 2019-07-16 DIAGNOSIS — R3129 Other microscopic hematuria: Secondary | ICD-10-CM

## 2019-07-16 NOTE — Telephone Encounter (Signed)
Pt wants urology referral for urine problem previously discussed with Dr Diona Browner per pt.pt has hx of blood in urine when looked at under microscope; pt cannot see blood in urine.  6-7 months ago pt had odor with urine and was advised to drink more water; if pt drinks a lot of water the odor from urine disappears but then will be back the next day. Pt wants to be checked by female urologist to see what causes odor.  Last annual 11/22/18 and acute on 04/15/19. Pt request cb.

## 2019-07-17 ENCOUNTER — Ambulatory Visit (INDEPENDENT_AMBULATORY_CARE_PROVIDER_SITE_OTHER): Payer: Medicare Other | Admitting: Podiatry

## 2019-07-17 ENCOUNTER — Other Ambulatory Visit: Payer: Self-pay | Admitting: Podiatry

## 2019-07-17 ENCOUNTER — Encounter: Payer: Self-pay | Admitting: Podiatry

## 2019-07-17 ENCOUNTER — Other Ambulatory Visit: Payer: Self-pay

## 2019-07-17 ENCOUNTER — Ambulatory Visit (INDEPENDENT_AMBULATORY_CARE_PROVIDER_SITE_OTHER): Payer: Medicare Other

## 2019-07-17 DIAGNOSIS — M79671 Pain in right foot: Secondary | ICD-10-CM

## 2019-07-17 DIAGNOSIS — M7661 Achilles tendinitis, right leg: Secondary | ICD-10-CM

## 2019-07-17 DIAGNOSIS — M7660 Achilles tendinitis, unspecified leg: Secondary | ICD-10-CM | POA: Diagnosis not present

## 2019-07-17 DIAGNOSIS — M722 Plantar fascial fibromatosis: Secondary | ICD-10-CM

## 2019-07-17 NOTE — Patient Instructions (Signed)

## 2019-07-18 NOTE — Progress Notes (Signed)
Subjective:   Patient ID: Kristy [name], female   DOB: 78 y.o.   MRN: [id number]   HPI Patient presents stating she has had a lot of problems with the bottom of the right heel and states that it seems to swell all day and get sore and that she is tried compression and elevation and has had some type of problems for several years but this is been worse and does not smoke likes to be active   Review of Systems  All other systems reviewed and are negative.       Objective:  Physical Exam Vitals signs and nursing note reviewed.  Constitutional:      Appearance: She is well-developed.  Pulmonary:     Effort: Pulmonary effort is normal.  Musculoskeletal: Normal range of motion.  Skin:    General: Skin is warm.  Neurological:     Mental Status: She is alert.     Neurovascular status intact muscle strength found to be adequate range of motion within normal limits with patient noted to have exquisite discomfort plantar aspect right heel at the insertional point of the tendon into the calcaneus with inflammation fluid around the medial band and found to have good digital perfusion and well oriented x3     Assessment:  Acute plantar fasciitis right with inflammation fluid of the medial band     Plan:  H&P conditions reviewed and went ahead and did sterile prep and injected the fascia 3 mg Kenalog 5 mg Xylocaine and applied fascial brace with instructions on usage.  Patient was given instructions for supportive shoes and will be seen back to recheck again on an as-needed basis  X-ray indicates spur did not indicate stress fracture or advanced arthritis

## 2019-07-19 NOTE — Telephone Encounter (Signed)
Referral made. I am not aware of many ( or any) female urologists in the area.

## 2019-07-21 NOTE — Telephone Encounter (Signed)
Spoke with patient and placed Referral on [facility] W[facility] to see Dr Hollice Espy. Patient is aware.

## 2019-07-24 ENCOUNTER — Other Ambulatory Visit: Payer: Self-pay

## 2019-07-24 ENCOUNTER — Ambulatory Visit (INDEPENDENT_AMBULATORY_CARE_PROVIDER_SITE_OTHER): Payer: Medicare Other

## 2019-07-24 DIAGNOSIS — Z23 Encounter for immunization: Secondary | ICD-10-CM

## 2019-07-31 ENCOUNTER — Other Ambulatory Visit: Payer: Self-pay

## 2019-07-31 ENCOUNTER — Ambulatory Visit (INDEPENDENT_AMBULATORY_CARE_PROVIDER_SITE_OTHER): Payer: Medicare Other | Admitting: Podiatry

## 2019-07-31 ENCOUNTER — Encounter: Payer: Self-pay | Admitting: Podiatry

## 2019-07-31 DIAGNOSIS — B351 Tinea unguium: Secondary | ICD-10-CM | POA: Diagnosis not present

## 2019-07-31 DIAGNOSIS — M722 Plantar fascial fibromatosis: Secondary | ICD-10-CM

## 2019-08-02 NOTE — Progress Notes (Signed)
Subjective:   Patient ID: Kristy [name], female   DOB: 78 y.o.   MRN: [id number]   HPI Patient states doing very well and very pleased with the response to conservative treatment   ROS      Objective:  Physical Exam  Neurovascular status intact with significant diminishment of discomfort of the right heel with inflammation present only upon deep palpation     Assessment:  Doing well post heel pain treatment     Plan:  H&P reviewed continue conservative care and spent a great deal time going over modalities that she can utilize along with heel lift therapy and shoe gear modifications

## 2019-08-05 ENCOUNTER — Encounter: Payer: Self-pay | Admitting: Urology

## 2019-08-05 ENCOUNTER — Ambulatory Visit (INDEPENDENT_AMBULATORY_CARE_PROVIDER_SITE_OTHER): Payer: Medicare Other | Admitting: Urology

## 2019-08-05 ENCOUNTER — Other Ambulatory Visit: Payer: Self-pay

## 2019-08-05 VITALS — BP 156/86 | HR 80 | Ht 63.0 in | Wt 177.0 lb

## 2019-08-05 DIAGNOSIS — R8290 Unspecified abnormal findings in urine: Secondary | ICD-10-CM | POA: Diagnosis not present

## 2019-08-05 DIAGNOSIS — R3129 Other microscopic hematuria: Secondary | ICD-10-CM

## 2019-08-05 DIAGNOSIS — I773 Arterial fibromuscular dysplasia: Secondary | ICD-10-CM | POA: Diagnosis not present

## 2019-08-05 LAB — URINALYSIS, COMPLETE
Bilirubin, UA: NEGATIVE
Glucose, UA: NEGATIVE
Ketones, UA: NEGATIVE
Leukocytes,UA: NEGATIVE
Nitrite, UA: NEGATIVE
Protein,UA: NEGATIVE
Specific Gravity, UA: 1.015 (ref 1.005–1.030)
Urobilinogen, Ur: 0.2 mg/dL (ref 0.2–1.0)
pH, UA: 5.5 (ref 5.0–7.5)

## 2019-08-05 LAB — MICROSCOPIC EXAMINATION

## 2019-08-05 NOTE — Progress Notes (Signed)
08/05/2019 7:18 PM   [name] Oct 03, 1941 [id number]  Referring provider: Jinny Sanders, MD [address] [place],  [place] [address]  Chief Complaint  Patient presents with  . Hematuria    New patient    HPI: 78 year old female referred for further evaluation of microscopic hematuria.  She was seen and evaluated by my former partner, Dr. Pilar Jarvis back in 2019 for the same issue.  At the time, she had 3-10 red blood cells per high-powered field, otherwise urine was unremarkable.  He recommended CT urogram as well as cystoscopy but she never proceeded with any of the recommended work-up.  She has a personal 3 microscopic hematuria for which she is undergone work-up on multiple occasions throughout her life.  She remembers this was appreciated by the time she was 32.  She is not had a work-up in greater than 5+ years.  She does have a remote history of smoking.  She smoked 2 packs a day from age [age], total 30-year pack year history.  She is like to return because she is concerned about foul-smelling urine.  Ports that the past 6 months, she is had occasion where her urine will smell foul and pneumonialike or other unpleasant smells.  She mentioned this to her primary care physician who recommended drinking more water.  When she drinks more water, she able to clear the smell.  She is worried that this may represent some underlying bladder issues.  She denies any frequency, urgency, dysuria, or gross hematuria.  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  She is followed by nephrology for this.  Her creatinine is normal.   PMH: Past Medical History:  Diagnosis Date  . Allergy   . 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 Hulda Humphrey of vascular surgery in [place]  . Arthritis    rt knee, foot  . Cataract    forming  right eye   . 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 Constipation    uses stool softener PRN- uses once a week to once every 2 weeks   . Fibromuscular dysplasia ([place])   . GERD (gastroesophageal reflux disease)   . Hyperlipidemia   . Hypertension    For 16 years; urinary catecholamines within normal limits 12/09; renal Doppler ultrasound showed no evidence for renal artery stenosis  . Normal echocardiogram 4/10   LVEF 65%; no regional wall motion abnormalities; normal RV size and function; pulmonic valve had increased gradient across w/ peak gradient of about 36 mmHg (range of moderate pulmonic stenosis) followup showed normal valve  . Papilloma of breast    right  . Tinnitus of left ear     Surgical History: Past Surgical History:  Procedure Laterality Date  . ANGIOPLASTY     2012  . BREAST BIOPSY Right 2006   benign  . BREAST EXCISIONAL BIOPSY Right 07/2017   benign  . BREAST LUMPECTOMY WITH RADIOACTIVE SEED LOCALIZATION Right 08/14/2017   Procedure: RIGHT BREAST LUMPECTOMY WITH RADIOACTIVE SEED LOCALIZATION;  Surgeon: [name], MD;  Location: [facility];  Service: General;  Laterality: Right;  . Cardiolyte  11/07   Neg  . COLONOSCOPY    . KNEE CARTILAGE SURGERY     right knee  . POLYPECTOMY    . TOTAL ABDOMINAL HYSTERECTOMY  no cervix  . UPPER GASTROINTESTINAL ENDOSCOPY      Home Medications:  Allergies as of 08/05/2019   No Known Allergies     Medication List       Accurate as of August 05, 2019  7:18 PM. If you have any questions, ask your nurse or doctor.        aspirin 81 MG chewable tablet Chew by mouth daily.   atorvastatin 20 MG tablet Commonly known as: LIPITOR Take 1 tablet (20 mg total) by mouth daily.   BENEFIBER DRINK MIX PO Take by mouth 2 (two) times daily as needed.   chlorthalidone 25 MG tablet Commonly known as: HYGROTON Take 1 tablet (25 mg total) by mouth daily.    cholecalciferol 1000 units tablet Commonly known as: VITAMIN D Take 2,000 Units by mouth daily.   cyanocobalamin 1000 MCG tablet Take 1,000 mcg by mouth daily.   Klor-Con M20 20 MEQ tablet Generic drug: potassium chloride SA Take 1 tablet (20 mEq total) by mouth daily.   metoprolol succinate 100 MG 24 hr tablet Commonly known as: TOPROL-XL TAKE 1 TABLET BY MOUTH DAILY WITH OR IMMEDIATELY FOLLOWING A MEAL.   NIFEdipine 60 MG 24 hr tablet Commonly known as: ADALAT CC   pantoprazole 40 MG tablet Commonly known as: PROTONIX Take 1 tablet (40 mg total) by mouth daily as needed.       Allergies: No Known Allergies  Family History: Family History  Problem Relation Age of Onset  . Prostate cancer Father   . Colon cancer Maternal Uncle   . Colon cancer Paternal Uncle   . Colon polyps Sister   . Coronary artery disease Paternal Grandmother   . Goiter Other        ?  . Esophageal cancer Neg Hx   . Stomach cancer Neg Hx   . Rectal cancer Neg Hx   . Bladder Cancer Neg Hx   . Kidney cancer Neg Hx     Social History:  reports that she quit smoking about 36 years ago. She has never used smokeless tobacco. She reports that she does not drink alcohol or use drugs.  ROS: UROLOGY Frequent Urination?: No Hard to postpone urination?: No Get up at night to urinate?: Yes Leakage of urine?: No Urine stream starts and stops?: No Trouble starting stream?: No Do you have to strain to urinate?: No Blood in urine?: Yes Urinary tract infection?: No Sexually transmitted disease?: No Injury to kidneys or bladder?: No Painful intercourse?: No Weak stream?: No Currently pregnant?: No Vaginal bleeding?: No Last menstrual period?: n  Gastrointestinal Nausea?: No Vomiting?: No Indigestion/heartburn?: No Diarrhea?: No Constipation?: No  Constitutional Fever: No Night sweats?: No Weight loss?: No Fatigue?: No  Skin Skin rash/lesions?: No Itching?: No  Eyes Blurred vision?:  No Double vision?: No  Ears/Nose/Throat Sore throat?: No Sinus problems?: No  Hematologic/Lymphatic Swollen glands?: No Easy bruising?: No  Cardiovascular Leg swelling?: Yes Chest pain?: No  Respiratory Cough?: No Shortness of breath?: No  Endocrine Excessive thirst?: No  Musculoskeletal Back pain?: No Joint pain?: No  Neurological Headaches?: No Dizziness?: No  Psychologic Depression?: No Anxiety?: No  Physical Exam: BP (!) 156/86   Pulse 80   Ht 5\' 3"  (1.6 m)   Wt 177 lb (80.3 kg)   BMI 31.35 kg/m   Constitutional:  Alert and oriented, No acute distress. HEENT: [place] AT, moist mucus membranes.  Trachea midline, no masses. Cardiovascular: No clubbing, cyanosis, or edema. Respiratory: Normal respiratory effort, no  increased work of breathing. GI: Abdomen is soft, nontender, nondistended, no abdominal masses Skin: No rashes, bruises or suspicious lesions. Neurologic: Grossly intact, no focal deficits, moving all 4 extremities. Psychiatric: Normal mood and affect.  Laboratory Data: Lab Results  Component Value Date   WBC 8.1 04/17/2019   HGB 13.6 04/17/2019   HCT 39.0 04/17/2019   MCV 95.1 04/17/2019   PLT 315.0 04/17/2019    Lab Results  Component Value Date   CREATININE 0.73 04/17/2019    Lab Results  Component Value Date   HGBA1C 6.3 04/17/2019    Urinalysis Consistent with 3-10 red blood cells per high-power field as per previous occasions  Pertinent Imaging: No recent crossectional imaging  Assessment & Plan:    1. Microscopic hematuria At times, even though her smoking history was relatively remote, given her age, degree of hematuria, and smoking history, she is in the high risk category.  As such, per new AUA guidelines, I recommended strongly that she pursue CT urogram and cystoscopy today.  We discussed the differential diagnosis for microscopic hematuria as well as the rationale for further evaluation.  She is now interested in  pursuing this.  She understands the risk and benefits.  She will return following CT urogram to discuss these results as well as proceed with cystoscopy. - Urinalysis, Complete - CT HEMATURIA WORKUP; Future  2. Fibromuscular dysplasia ([place]) Closely by nephrology/vascular surgery  3. Foul smelling urine Smelling urine is not is highly indicative of any pathology.,  This is likely related to dietary or medication.  Encourage hydration as per PCP.  REassured.   Return in about 4 weeks (around 09/02/2019) for cysto, CT scan.  Hollice Espy, MD  [facility] [address] [place], [place] [address] [phone]  I spent 25 min with this patient of which greater than 50% was spent in counseling and coordination of care with the patient.

## 2019-08-07 ENCOUNTER — Ambulatory Visit: Payer: Medicare Other | Admitting: Podiatry

## 2019-08-13 ENCOUNTER — Ambulatory Visit: Payer: Medicare Other | Admitting: Urology

## 2019-08-14 ENCOUNTER — Other Ambulatory Visit: Payer: Self-pay

## 2019-08-14 DIAGNOSIS — R6889 Other general symptoms and signs: Secondary | ICD-10-CM | POA: Diagnosis not present

## 2019-08-14 DIAGNOSIS — Z20822 Contact with and (suspected) exposure to covid-19: Secondary | ICD-10-CM

## 2019-08-15 LAB — NOVEL CORONAVIRUS, NAA: SARS-CoV-2, NAA: NOT DETECTED

## 2019-09-02 ENCOUNTER — Other Ambulatory Visit: Payer: Medicare Other | Admitting: Urology

## 2019-09-11 ENCOUNTER — Other Ambulatory Visit: Payer: Self-pay | Admitting: Family Medicine

## 2019-09-11 DIAGNOSIS — E2839 Other primary ovarian failure: Secondary | ICD-10-CM

## 2019-09-15 ENCOUNTER — Other Ambulatory Visit: Payer: Self-pay

## 2019-09-15 ENCOUNTER — Ambulatory Visit
Admission: RE | Admit: 2019-09-15 | Discharge: 2019-09-15 | Disposition: A | Discharge status: Home / Self Care | Payer: Medicare Other | Source: Ambulatory Visit | Attending: Family Medicine | Admitting: Family Medicine

## 2019-09-15 DIAGNOSIS — Z1231 Encounter for screening mammogram for malignant neoplasm of breast: Secondary | ICD-10-CM

## 2019-09-15 DIAGNOSIS — Z780 Asymptomatic menopausal state: Secondary | ICD-10-CM | POA: Diagnosis not present

## 2019-09-15 DIAGNOSIS — M8589 Other specified disorders of bone density and structure, multiple sites: Secondary | ICD-10-CM | POA: Diagnosis not present

## 2019-09-15 DIAGNOSIS — E2839 Other primary ovarian failure: Secondary | ICD-10-CM

## 2019-10-03 ENCOUNTER — Telehealth: Payer: Self-pay

## 2019-10-03 NOTE — Telephone Encounter (Signed)
What has she tried in past? Did pantoprazole help in past?

## 2019-10-03 NOTE — Telephone Encounter (Signed)
Pt has been taking the generic protonix 40 mg one daily and pt cannot see any improvement in pts heart burn or acid reflux. Pt said she is trying to watch what she eats but as an example last night pt ate spaghetti and it did not bother pt until 4-5 hrs after eating the spaghetti. Pt said now it really does not matter what pt eats she will have heart burn and acid reflux. Pt also has IBS but sometimes she could have constipation (benifiber not helping) and other times she has diarrhea. Pt said she recently had colonoscopy. Pt said when she chews 3 Tums that seems to help a little bit. Pt has CPX 11/25/19. Pt wants to know if there is a suggestion of different med either OTC or prescription that she could try until her CPX. [facility].Please advise. Pt request cb after Dr Diona Browner reviews.

## 2019-10-03 NOTE — Telephone Encounter (Signed)
Left message to call office

## 2019-10-06 NOTE — Telephone Encounter (Signed)
Spoke with Kristy [name].  Patient states she does not take the pantoprazole everyday.  She has been trying to really watch what she eats and for the past 2 days she has not had any heartburn.  She states she is under a lot of stress and know she does not eat like she should.  I advised her to take the pantoprazole every day for 2 weeks to see if that makes a difference. She is agreeable to taking the medication everyday and will call back if symptoms do not improve.

## 2019-10-06 NOTE — Telephone Encounter (Signed)
She stated pantoprzole she does not take every day.  She stated when takes it first thing in morning it seems to help.  She has been trying to watching what she eats.

## 2019-10-06 NOTE — Telephone Encounter (Signed)
Left message for Ms. Jasperson to return my call.

## 2019-10-07 NOTE — Telephone Encounter (Signed)
Patient advised. She will call us back in 2 weeks to let us know how she is doing. Also will call sooner if symptoms get worse.

## 2019-10-07 NOTE — Telephone Encounter (Signed)
Take pantoprazole   But make sure to take daily or change to nexium 40 mg daily.

## 2019-10-15 ENCOUNTER — Other Ambulatory Visit: Payer: Self-pay | Admitting: Family Medicine

## 2019-11-10 ENCOUNTER — Telehealth: Payer: Self-pay | Admitting: Family Medicine

## 2019-11-10 DIAGNOSIS — E559 Vitamin D deficiency, unspecified: Secondary | ICD-10-CM

## 2019-11-10 DIAGNOSIS — E0590 Thyrotoxicosis, unspecified without thyrotoxic crisis or storm: Secondary | ICD-10-CM

## 2019-11-10 DIAGNOSIS — E782 Mixed hyperlipidemia: Secondary | ICD-10-CM

## 2019-11-10 DIAGNOSIS — R7303 Prediabetes: Secondary | ICD-10-CM

## 2019-11-10 NOTE — Telephone Encounter (Signed)
-----   Message from Cloyd Stagers, RT sent at 11/06/2019  1:38 PM EST ----- Regarding: Lab Orders for Tuesday 12.29.2020 Please place lab orders for Tuesday 12.29.2020, office visit for physical on Tuesday 1.5.2021 Thank you, Dyke Maes RT(R)

## 2019-11-18 ENCOUNTER — Other Ambulatory Visit (INDEPENDENT_AMBULATORY_CARE_PROVIDER_SITE_OTHER): Payer: Medicare Other

## 2019-11-18 ENCOUNTER — Ambulatory Visit (INDEPENDENT_AMBULATORY_CARE_PROVIDER_SITE_OTHER): Payer: Medicare Other

## 2019-11-18 ENCOUNTER — Ambulatory Visit: Payer: Medicare Other

## 2019-11-18 ENCOUNTER — Other Ambulatory Visit: Payer: Self-pay

## 2019-11-18 VITALS — BP 114/62 | Wt 170.0 lb

## 2019-11-18 DIAGNOSIS — E559 Vitamin D deficiency, unspecified: Secondary | ICD-10-CM

## 2019-11-18 DIAGNOSIS — Z0000 Encounter for general adult medical examination without abnormal findings: Secondary | ICD-10-CM

## 2019-11-18 DIAGNOSIS — E782 Mixed hyperlipidemia: Secondary | ICD-10-CM | POA: Diagnosis not present

## 2019-11-18 DIAGNOSIS — R7303 Prediabetes: Secondary | ICD-10-CM

## 2019-11-18 LAB — VITAMIN D 25 HYDROXY (VIT D DEFICIENCY, FRACTURES): VITD: 28.04 ng/mL — ABNORMAL LOW (ref 30.00–100.00)

## 2019-11-18 LAB — COMPREHENSIVE METABOLIC PANEL
ALT: 13 U/L (ref 0–35)
AST: 17 U/L (ref 0–37)
Albumin: 4.1 g/dL (ref 3.5–5.2)
Alkaline Phosphatase: 54 U/L (ref 39–117)
BUN: 16 mg/dL (ref 6–23)
CO2: 29 mEq/L (ref 19–32)
Calcium: 9.6 mg/dL (ref 8.4–10.5)
Chloride: 101 mEq/L (ref 96–112)
Creatinine, Ser: 0.7 mg/dL (ref 0.40–1.20)
GFR: 98.32 mL/min (ref >60.00)
Glucose, Bld: 113 mg/dL — ABNORMAL HIGH (ref 70–99)
Potassium: 3.2 mEq/L — ABNORMAL LOW (ref 3.5–5.1)
Sodium: 138 mEq/L (ref 135–145)
Total Bilirubin: 0.5 mg/dL (ref 0.2–1.2)
Total Protein: 7 g/dL (ref 6.0–8.3)

## 2019-11-18 LAB — LIPID PANEL
Cholesterol: 157 mg/dL (ref 0–200)
HDL: 42.7 mg/dL (ref >39.00)
LDL Cholesterol: 82 mg/dL (ref 0–99)
NonHDL: 114.22
Total CHOL/HDL Ratio: 4
Triglycerides: 162 mg/dL — ABNORMAL HIGH (ref 0.0–149.0)
VLDL: 32.4 mg/dL (ref 0.0–40.0)

## 2019-11-18 LAB — HEMOGLOBIN A1C: Hgb A1c MFr Bld: 6.1 % (ref 4.6–6.5)

## 2019-11-18 NOTE — Progress Notes (Signed)
PCP notes:  Health Maintenance: No gaps noted   Abnormal Screenings: none   Patient concerns: Patient is having problems with IBS (constipation and diarrhea).   Nurse concerns: none   Next PCP appt: 11/25/2019 @ 11:20 am

## 2019-11-18 NOTE — Patient Instructions (Signed)
Kristy [name] , Thank you for taking time to come for your Medicare Wellness Visit. I appreciate your ongoing commitment to your health goals. Please review the following plan we discussed and let me know if I can assist you in the future.   Screening recommendations/referrals: Colonoscopy: Up to date, completed 05/15/2019 Mammogram: Up to date, completed 09/15/2019 Bone Density: Up to date, completed 09/15/2019 Recommended yearly ophthalmology/optometry visit for glaucoma screening and checkup Recommended yearly dental visit for hygiene and checkup  Vaccinations: Influenza vaccine: Up to date, completed 07/24/2019 Pneumococcal vaccine: Completed series Tdap vaccine: decline Shingles vaccine: discussed    Advanced directives: Advance directive discussed with you today. I have provided a copy for you to complete at home and have notarized. Once this is complete please bring a copy in to our office so we can scan it into your chart.  Conditions/risks identified: hypertension, hyperlipidemia  Next appointment: 11/25/2019 @ 11:20 am    Preventive Care 65 Years and Older, Female Preventive care refers to lifestyle choices and visits with your health care provider that can promote health and wellness. What does preventive care include?  A yearly physical exam. This is also called an annual well check.  Dental exams once or twice a year.  Routine eye exams. Ask your health care provider how often you should have your eyes checked.  Personal lifestyle choices, including:  Daily care of your teeth and gums.  Regular physical activity.  Eating a healthy diet.  Avoiding tobacco and drug use.  Limiting alcohol use.  Practicing safe sex.  Taking low-dose aspirin every day.  Taking vitamin and mineral supplements as recommended by your health care provider. What happens during an annual well check? The services and screenings done by your health care provider during your annual well  check will depend on your age, overall health, lifestyle risk factors, and family history of disease. Counseling  Your health care provider may ask you questions about your:  Alcohol use.  Tobacco use.  Drug use.  Emotional well-being.  Home and relationship well-being.  Sexual activity.  Eating habits.  History of falls.  Memory and ability to understand (cognition).  Work and work Statistician.  Reproductive health. Screening  You may have the following tests or measurements:  Height, weight, and BMI.  Blood pressure.  Lipid and cholesterol levels. These may be checked every 5 years, or more frequently if you are over 68 years old.  Skin check.  Lung cancer screening. You may have this screening every year starting at age 64 if you have a 30-pack-year history of smoking and currently smoke or have quit within the past 15 years.  Fecal occult blood test (FOBT) of the stool. You may have this test every year starting at age 85.  Flexible sigmoidoscopy or colonoscopy. You may have a sigmoidoscopy every 5 years or a colonoscopy every 10 years starting at age 66.  Hepatitis C blood test.  Hepatitis B blood test.  Sexually transmitted disease (STD) testing.  Diabetes screening. This is done by checking your blood sugar (glucose) after you have not eaten for a while (fasting). You may have this done every 1-3 years.  Bone density scan. This is done to screen for osteoporosis. You may have this done starting at age 23.  Mammogram. This may be done every 1-2 years. Talk to your health care provider about how often you should have regular mammograms. Talk with your health care provider about your test results, treatment options, and if  necessary, the need for more tests. Vaccines  Your health care provider may recommend certain vaccines, such as:  Influenza vaccine. This is recommended every year.  Tetanus, diphtheria, and acellular pertussis (Tdap, Td) vaccine. You  may need a Td booster every 10 years.  Zoster vaccine. You may need this after age 63.  Pneumococcal 13-valent conjugate (PCV13) vaccine. One dose is recommended after age 45.  Pneumococcal polysaccharide (PPSV23) vaccine. One dose is recommended after age 40. Talk to your health care provider about which screenings and vaccines you need and how often you need them. This information is not intended to replace advice given to you by your health care provider. Make sure you discuss any questions you have with your health care provider. Document Released: 12/03/2015 Document Revised: 07/26/2016 Document Reviewed: 09/07/2015 Elsevier Interactive Patient Education  2017 [place] Prevention in the Home Falls can cause injuries. They can happen to people of all ages. There are many things you can do to make your home safe and to help prevent falls. What can I do on the outside of my home?  Regularly fix the edges of walkways and driveways and fix any cracks.  Remove anything that might make you trip as you walk through a door, such as a raised step or threshold.  Trim any bushes or trees on the path to your home.  Use bright outdoor lighting.  Clear any walking paths of anything that might make someone trip, such as rocks or tools.  Regularly check to see if handrails are loose or broken. Make sure that both sides of any steps have handrails.  Any raised decks and porches should have guardrails on the edges.  Have any leaves, snow, or ice cleared regularly.  Use sand or salt on walking paths during winter.  Clean up any spills in your garage right away. This includes oil or grease spills. What can I do in the bathroom?  Use night lights.  Install grab bars by the toilet and in the tub and shower. Do not use towel bars as grab bars.  Use non-skid mats or decals in the tub or shower.  If you need to sit down in the shower, use a plastic, non-slip stool.  Keep the floor  dry. Clean up any water that spills on the floor as soon as it happens.  Remove soap buildup in the tub or shower regularly.  Attach bath mats securely with double-sided non-slip rug tape.  Do not have throw rugs and other things on the floor that can make you trip. What can I do in the bedroom?  Use night lights.  Make sure that you have a light by your bed that is easy to reach.  Do not use any sheets or blankets that are too big for your bed. They should not hang down onto the floor.  Have a firm chair that has side arms. You can use this for support while you get dressed.  Do not have throw rugs and other things on the floor that can make you trip. What can I do in the kitchen?  Clean up any spills right away.  Avoid walking on wet floors.  Keep items that you use a lot in easy-to-reach places.  If you need to reach something above you, use a strong step stool that has a grab bar.  Keep electrical cords out of the way.  Do not use floor polish or wax that makes floors slippery. If you must  use wax, use non-skid floor wax.  Do not have throw rugs and other things on the floor that can make you trip. What can I do with my stairs?  Do not leave any items on the stairs.  Make sure that there are handrails on both sides of the stairs and use them. Fix handrails that are broken or loose. Make sure that handrails are as long as the stairways.  Check any carpeting to make sure that it is firmly attached to the stairs. Fix any carpet that is loose or worn.  Avoid having throw rugs at the top or bottom of the stairs. If you do have throw rugs, attach them to the floor with carpet tape.  Make sure that you have a light switch at the top of the stairs and the bottom of the stairs. If you do not have them, ask someone to add them for you. What else can I do to help prevent falls?  Wear shoes that:  Do not have high heels.  Have rubber bottoms.  Are comfortable and fit you  well.  Are closed at the toe. Do not wear sandals.  If you use a stepladder:  Make sure that it is fully opened. Do not climb a closed stepladder.  Make sure that both sides of the stepladder are locked into place.  Ask someone to hold it for you, if possible.  Clearly mark and make sure that you can see:  Any grab bars or handrails.  First and last steps.  Where the edge of each step is.  Use tools that help you move around (mobility aids) if they are needed. These include:  Canes.  Walkers.  Scooters.  Crutches.  Turn on the lights when you go into a dark area. Replace any light bulbs as soon as they burn out.  Set up your furniture so you have a clear path. Avoid moving your furniture around.  If any of your floors are uneven, fix them.  If there are any pets around you, be aware of where they are.  Review your medicines with your doctor. Some medicines can make you feel dizzy. This can increase your chance of falling. Ask your doctor what other things that you can do to help prevent falls. This information is not intended to replace advice given to you by your health care provider. Make sure you discuss any questions you have with your health care provider. Document Released: 09/02/2009 Document Revised: 04/13/2016 Document Reviewed: 12/11/2014 Elsevier Interactive Patient Education  2017 [employer].

## 2019-11-18 NOTE — Progress Notes (Signed)
Subjective:   Kristy [name] is a 78 y.o. female who presents for Medicare Annual (Subsequent) preventive examination.  Review of Systems: N/A   This visit is being conducted through telemedicine via telephone at the nurse health advisor's home address due to the COVID-19 pandemic. This patient has given me verbal consent via doximity to conduct this visit, patient states they are participating from their home address. Patient and myself are on the telephone call. There is no referral for this visit. Some vital signs may be absent or patient reported.    Patient identification: identified by name, DOB, and current address   Cardiac Risk Factors include: advanced age (>31men, >13 women);hypertension;dyslipidemia     Objective:     Vitals: BP 114/62 Comment: unable to obtain  Wt 170 lb (77.1 kg) Comment: unable to obtain  BMI 30.11 kg/m   Body mass index is 30.11 kg/m.  Advanced Directives 11/18/2019 01/27/2019 11/05/2018 09/04/2017 08/14/2017 08/07/2017 03/02/2017  Does Patient Have a Medical Advance Directive? No No No No No No No  Type of Advance Directive - - - - - - -  Copy of Healthcare Power of Attorney in Chart? - - - - - - -  Would patient like information on creating a medical advance directive? Yes (MAU/Ambulatory/Procedural Areas - Information given) - No - Patient declined - No - Patient declined - -    Tobacco Social History   Tobacco Use  Smoking Status Former Smoker  . Quit date: 11/20/1982  . Years since quitting: 37.0  Smokeless Tobacco Never Used  Tobacco Comment   15 pack year history     Counseling given: Not Answered Comment: 15 pack year history   Clinical Intake:  Pre-visit preparation completed: Yes  Pain : No/denies pain     Nutritional Risks: None Diabetes: No  How often do you need to have someone help you when you read instructions, pamphlets, or other written materials from your doctor or pharmacy?: 1 - Never What is the last grade  level you completed in school?: some college  Interpreter Needed?: No  Information entered by :: CJohnson, LPN  Past Medical History:  Diagnosis Date  . Allergy   . 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 Hulda Humphrey of vascular surgery in [place]  . Arthritis    rt knee, foot  . Cataract    forming right eye   . 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 Constipation    uses stool softener PRN- uses once a week to once every 2 weeks   . Fibromuscular dysplasia ([place])   . GERD (gastroesophageal reflux disease)   . Hyperlipidemia   . Hypertension    For 16 years; urinary catecholamines within normal limits 12/09; renal Doppler ultrasound showed no evidence for renal artery stenosis  . Normal echocardiogram 4/10   LVEF 65%; no regional wall motion abnormalities; normal RV size and function; pulmonic valve had increased gradient across w/ peak gradient of about 36 mmHg (range of moderate pulmonic stenosis) followup showed normal valve  . Papilloma of breast    right  . Tinnitus of left ear    Past Surgical History:  Procedure Laterality Date  . ANGIOPLASTY     2012  . BREAST BIOPSY Right 2006   benign  . BREAST EXCISIONAL BIOPSY Right 07/2017   benign  . BREAST LUMPECTOMY WITH RADIOACTIVE SEED LOCALIZATION Right 08/14/2017  Procedure: RIGHT BREAST LUMPECTOMY WITH RADIOACTIVE SEED LOCALIZATION;  Surgeon: [name], MD;  Location: [facility];  Service: General;  Laterality: Right;  . Cardiolyte  11/07   Neg  . COLONOSCOPY    . KNEE CARTILAGE SURGERY     right knee  . POLYPECTOMY    . TOTAL ABDOMINAL HYSTERECTOMY     no cervix  . UPPER GASTROINTESTINAL ENDOSCOPY     Family History  Problem Relation Age of Onset  . Prostate cancer Father   . Colon cancer Maternal Uncle   . Colon cancer Paternal Uncle   . Colon polyps Sister   . Coronary artery disease  Paternal Grandmother   . Goiter Other        ?  . Esophageal cancer Neg Hx   . Stomach cancer Neg Hx   . Rectal cancer Neg Hx   . Bladder Cancer Neg Hx   . Kidney cancer Neg Hx    Social History   Socioeconomic History  . Marital status: Widowed    Spouse name: Not on file  . Number of children: 0  . Years of education: Not on file  . Highest education level: Not on file  Occupational History  . Occupation: Scientific laboratory technician: RETIRED    Comment: Retired  Tobacco Use  . Smoking status: Former Smoker    Quit date: 11/20/1982    Years since quitting: 37.0  . Smokeless tobacco: Never Used  . Tobacco comment: 15 pack year history  Substance and Sexual Activity  . Alcohol use: No  . Drug use: No  . Sexual activity: Not on file  Other Topics Concern  . Not on file  Social History Narrative   Widowed in 2007-spouse died from colon cancer.   Regular exercise at [facility].   Diet consists of fruits and veggies, snacks a lot.       Social Determinants of Health   Financial Resource Strain: Low Risk   . Difficulty of Paying Living Expenses: Not hard at all  Food Insecurity: No Food Insecurity  . Worried About Charity fundraiser in the Last Year: Never true  . Ran Out of Food in the Last Year: Never true  Transportation Needs: No Transportation Needs  . Lack of Transportation (Medical): No  . Lack of Transportation (Non-Medical): No  Physical Activity: Sufficiently Active  . Days of Exercise per Week: 7 days  . Minutes of Exercise per Session: 30 min  Stress: No Stress Concern Present  . Feeling of Stress : Not at all  Social Connections:   . Frequency of Communication with Friends and Family: Not on file  . Frequency of Social Gatherings with Friends and Family: Not on file  . Attends Religious Services: Not on file  . Active Member of Clubs or Organizations: Not on file  . Attends Archivist Meetings: Not on file  . Marital Status: Not on file     Outpatient Encounter Medications as of 11/18/2019  Medication Sig  . aspirin 81 MG chewable tablet Chew by mouth daily.  Marland Kitchen atorvastatin (LIPITOR) 20 MG tablet TAKE 1 TABLET BY MOUTH  DAILY  . chlorthalidone (HYGROTON) 25 MG tablet TAKE 1 TABLET BY MOUTH  DAILY  . cholecalciferol (VITAMIN D) 1000 units tablet Take 2,000 Units by mouth daily.  . cyanocobalamin 1000 MCG tablet Take 1,000 mcg by mouth daily.  Marland Kitchen KLOR-CON M20 20 MEQ tablet TAKE 1 TABLET BY MOUTH  DAILY  .  metoprolol succinate (TOPROL-XL) 100 MG 24 hr tablet TAKE 1 TABLET BY MOUTH  DAILY WITH OR IMMEDIATELY  FOLLOWING A MEAL.  Marland Kitchen NIFEdipine (PROCARDIA XL/NIFEDICAL XL) 60 MG 24 hr tablet TAKE 1 TABLET BY MOUTH  DAILY  . pantoprazole (PROTONIX) 40 MG tablet Take 1 tablet (40 mg total) by mouth daily as needed.  . Wheat Dextrin (BENEFIBER DRINK MIX PO) Take by mouth 2 (two) times daily as needed.   No facility-administered encounter medications on file as of 11/18/2019.    Activities of Daily Living In your present state of health, do you have any difficulty performing the following activities: 11/18/2019  Hearing? Y  Comment slight hearing loss in left ear  Vision? N  Difficulty concentrating or making decisions? N  Walking or climbing stairs? N  Dressing or bathing? N  Doing errands, shopping? N  Preparing Food and eating ? N  Using the Toilet? N  In the past six months, have you accidently leaked urine? N  Do you have problems with loss of bowel control? N  Managing your Medications? N  Managing your Finances? N  Housekeeping or managing your Housekeeping? N  Some recent data might be hidden    Patient Care Team: Jinny Sanders, MD as PCP - General    Assessment:   This is a routine wellness examination for [place].  Exercise Activities and Dietary recommendations Current Exercise Habits: Home exercise routine, Type of exercise: walking, Time (Minutes): 30, Frequency (Times/Week): 7, Weekly Exercise  (Minutes/Week): 210, Intensity: Moderate, Exercise limited by: None identified  Goals    . Increase physical activity     Starting 11/05/2018, I will continue to walk at least 5000 steps daily.     . Patient Stated     11/18/2019, I will try to lose about 10 lbs and get to 160 lbs.        Fall Risk Fall Risk  11/18/2019 11/05/2018 10/05/2017 09/04/2017 06/01/2016  Falls in the past year? 0 0 No No No  Number falls in past yr: 0 - - - -  Injury with Fall? 0 - - - -  Risk for fall due to : Medication side effect - - - -  Follow up Falls evaluation completed;Falls prevention discussed - - - -   Is the patient's home free of loose throw rugs in walkways, pet beds, electrical cords, etc?   yes      Grab bars in the bathroom? no      Handrails on the stairs?   no      Adequate lighting?   yes  Timed Get Up and Go performed: N/A  Depression Screen PHQ 2/9 Scores 11/18/2019 11/05/2018 10/05/2017 09/04/2017  PHQ - 2 Score 0 0 0 0  PHQ- 9 Score 0 0 - 2     Cognitive Function MMSE - Mini Mental State Exam 11/18/2019 11/05/2018 09/04/2017  Orientation to time 5 5 5   Orientation to Place 5 5 5   Registration 3 3 3   Attention/ Calculation 5 0 0  Recall 3 3 3   Language- name 2 objects - 0 0  Language- repeat 1 1 1   Language- follow 3 step command - 3 3  Language- read & follow direction - 0 0  Write a sentence - 0 0  Copy design - 0 0  Total score - 20 20  Mini Cog  Mini-Cog screen was completed. Maximum score is 22. A value of 0 denotes this part of the MMSE was not  completed or the patient failed this part of the Mini-Cog screening.       Immunization History  Administered Date(s) Administered  . Fluad Quad(high Dose 65+) 07/24/2019  . Influenza Split 10/03/2011, 08/30/2012  . Influenza Whole 08/28/2007, 08/19/2008, 08/12/2009, 09/14/2010  . Influenza,inj,Quad PF,6+ Mos 08/13/2013, 08/18/2014, 09/03/2015, 08/03/2016, 09/04/2017, 09/05/2018  . Pneumococcal Conjugate-13  01/09/2014  . Pneumococcal Polysaccharide-23 04/20/2006, 09/04/2017  . Td 04/20/2006    Qualifies for Shingles Vaccine? Yes   Screening Tests Health Maintenance  Topic Date Due  . TETANUS/TDAP  04/19/2026 (Originally 04/20/2016)  . COLONOSCOPY  05/14/2021  . INFLUENZA VACCINE  Completed  . DEXA SCAN  Completed  . PNA vac Low Risk Adult  Completed    Cancer Screenings: Lung: Low Dose CT Chest recommended if Age 46-80 years, 30 pack-year currently smoking OR have quit w/in 15years. Patient does not qualify. Breast:  Up to date on Mammogram? Yes, completed 09/15/2019   Up to date of Bone Density/Dexa? Yes, completed 09/15/2019 Colorectal: completed 05/15/2019  Additional Screenings:  Hepatitis C Screening: N/A     Plan:   Patient is working on losing around 10 lbs.   I have personally reviewed and noted the following in the patient's chart:   . Medical and social history . Use of alcohol, tobacco or illicit drugs  . Current medications and supplements . Functional ability and status . Nutritional status . Physical activity . Advanced directives . List of other physicians . Hospitalizations, surgeries, and ER visits in previous 12 months . Vitals . Screenings to include cognitive, depression, and falls . Referrals and appointments  In addition, I have reviewed and discussed with patient certain preventive protocols, quality metrics, and best practice recommendations. A written personalized care plan for preventive services as well as general preventive health recommendations were provided to patient.     Andrez Grime, LPN  [phone]

## 2019-11-19 ENCOUNTER — Other Ambulatory Visit: Payer: Self-pay

## 2019-11-19 ENCOUNTER — Other Ambulatory Visit: Payer: Self-pay | Admitting: Family Medicine

## 2019-11-19 MED ORDER — PANTOPRAZOLE SODIUM 40 MG PO TBEC: 40.0000 mg | DELAYED_RELEASE_TABLET | Freq: Every day | ORAL | 3 refills | Status: DC

## 2019-11-25 ENCOUNTER — Encounter: Payer: Medicare Other | Admitting: Family Medicine

## 2019-11-25 ENCOUNTER — Encounter: Payer: Self-pay | Admitting: Family Medicine

## 2019-11-25 ENCOUNTER — Other Ambulatory Visit: Payer: Self-pay

## 2019-11-25 ENCOUNTER — Ambulatory Visit (INDEPENDENT_AMBULATORY_CARE_PROVIDER_SITE_OTHER): Payer: Medicare Other | Admitting: Family Medicine

## 2019-11-25 VITALS — BP 118/70 | HR 66 | Temp 96.5°F | Ht 62.0 in | Wt 175.5 lb

## 2019-11-25 DIAGNOSIS — I701 Atherosclerosis of renal artery: Secondary | ICD-10-CM

## 2019-11-25 DIAGNOSIS — I6523 Occlusion and stenosis of bilateral carotid arteries: Secondary | ICD-10-CM

## 2019-11-25 DIAGNOSIS — R7303 Prediabetes: Secondary | ICD-10-CM

## 2019-11-25 DIAGNOSIS — Z0000 Encounter for general adult medical examination without abnormal findings: Secondary | ICD-10-CM | POA: Diagnosis not present

## 2019-11-25 DIAGNOSIS — I773 Arterial fibromuscular dysplasia: Secondary | ICD-10-CM

## 2019-11-25 DIAGNOSIS — I10 Essential (primary) hypertension: Secondary | ICD-10-CM

## 2019-11-25 DIAGNOSIS — E782 Mixed hyperlipidemia: Secondary | ICD-10-CM

## 2019-11-25 NOTE — Assessment & Plan Note (Signed)
Stable on low carb diet.

## 2019-11-25 NOTE — Assessment & Plan Note (Signed)
Followed by vascular. Stable last check 05/05/2019

## 2019-11-25 NOTE — Patient Instructions (Addendum)
Restart vit D supplement 1000 units daily  Restart potassium.  Continue working on  exercise, weight loss, healthy eating habits.

## 2019-11-25 NOTE — Assessment & Plan Note (Signed)
Almost at goal LDL < 70 on statin.

## 2019-11-25 NOTE — Progress Notes (Signed)
Chief Complaint  Patient presents with  . Annual Exam    Part 2    History of Present Illness: HPI   The patient presents for  complete physical and review of chronic health problems. He/She also has the following acute concerns today:none  The patient saw a LPN or RN for medicare wellness visit.  Prevention and wellness was reviewed in detail. Note reviewed and important notes copied below. Health Maintenance: No gaps noted Abnormal Screenings: none  11/25/19 Hypertension:   Good control on amlodipine and metoprolol.             Using medication without problems or lightheadedness: none Chest pain with exertion:none Edema:none Short of breath:none Average home BPs: Other issues:  Elevated Cholesterol:  Improved  On atorvastatin Lab Results  Component Value Date   CHOL 157 11/18/2019   HDL 42.70 11/18/2019   LDLCALC 82 11/18/2019   LDLDIRECT 140.3 03/26/2013   TRIG 162.0 (H) 11/18/2019   CHOLHDL 4 11/18/2019  Using medications without problems:none Muscle aches:  occ Diet compliance:  Healthy, veggies.  low carb, no bread. Exercise:  walking Other complaints:  Vit D: restart vit D  Prediabetes: stable A1C 6.1  Thyroid: Stable levels.  Lab Results  Component Value Date   TSH 2.67 04/17/2019    Fibromuscular dysplasia, renal stenosis and carotid stenosis followed by vascular. [place] of carotids and renal.. Every 2 years. Some change on kidney... followed by Dr. Holley Raring.  Wt Readings from Last 3 Encounters:  11/25/19 175 lb 8 oz (79.6 kg)  11/18/19 170 lb (77.1 kg)  08/05/19 177 lb (80.3 kg)     This visit occurred during the SARS-CoV-2 public health emergency.  Safety protocols were in place, including screening questions prior to the visit, additional usage of staff PPE, and extensive cleaning of exam room while observing appropriate contact time as indicated for disinfecting solutions.   COVID 19 screen:  No recent travel or known exposure to  COVID19 The patient denies respiratory symptoms of COVID 19 at this time. The importance of social distancing was discussed today.     Review of Systems  Constitutional: Negative for chills and fever.  HENT: Negative for congestion and ear pain.   Eyes: Negative for pain and redness.  Respiratory: Negative for cough and shortness of breath.   Cardiovascular: Negative for chest pain, palpitations and leg swelling.  Gastrointestinal: Positive for constipation. Negative for abdominal pain, blood in stool, diarrhea, nausea and vomiting.       IBS  Genitourinary: Negative for dysuria.  Musculoskeletal: Negative for falls and myalgias.  Skin: Negative for rash.  Neurological: Negative for dizziness.  Psychiatric/Behavioral: Negative for depression. The patient is not nervous/anxious.       Past Medical History:  Diagnosis Date  . Allergy   . 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 Hulda Humphrey of vascular surgery in [place]  . Arthritis    rt knee, foot  . Cataract    forming right eye   . 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 Constipation    uses stool softener PRN- uses once a week to once every 2 weeks   . Fibromuscular dysplasia ([place])   . GERD (gastroesophageal reflux disease)   . Hyperlipidemia   . Hypertension    For 16 years; urinary catecholamines within normal limits 12/09; renal Doppler ultrasound showed no evidence for renal artery stenosis  .  Normal echocardiogram 4/10   LVEF 65%; no regional wall motion abnormalities; normal RV size and function; pulmonic valve had increased gradient across w/ peak gradient of about 36 mmHg (range of moderate pulmonic stenosis) followup showed normal valve  . Papilloma of breast    right  . Tinnitus of left ear     reports that she quit smoking about 37 years ago. She has never used smokeless tobacco. She reports that she does not  drink alcohol or use drugs.   Current Outpatient Medications:  .  aspirin 81 MG chewable tablet, Chew by mouth daily., Disp: , Rfl:  .  atorvastatin (LIPITOR) 20 MG tablet, TAKE 1 TABLET BY MOUTH  DAILY, Disp: 90 tablet, Rfl: 0 .  chlorthalidone (HYGROTON) 25 MG tablet, TAKE 1 TABLET BY MOUTH  DAILY, Disp: 90 tablet, Rfl: 0 .  KLOR-CON M20 20 MEQ tablet, TAKE 1 TABLET BY MOUTH  DAILY, Disp: 90 tablet, Rfl: 0 .  metoprolol succinate (TOPROL-XL) 100 MG 24 hr tablet, TAKE 1 TABLET BY MOUTH  DAILY WITH OR IMMEDIATELY  FOLLOWING A MEAL., Disp: 90 tablet, Rfl: 0 .  NIFEdipine (PROCARDIA XL/NIFEDICAL XL) 60 MG 24 hr tablet, TAKE 1 TABLET BY MOUTH  DAILY, Disp: 90 tablet, Rfl: 0 .  pantoprazole (PROTONIX) 40 MG tablet, Take 1 tablet (40 mg total) by mouth daily., Disp: 90 tablet, Rfl: 3   Observations/Objective: Blood pressure 118/70, pulse 66, temperature (!) 96.5 F (35.8 C), temperature source Temporal, height 5\' 2"  (1.575 m), weight 175 lb 8 oz (79.6 kg), SpO2 100 %.  Physical Exam Constitutional:      General: She is not in acute distress.    Appearance: Normal appearance. She is well-developed. She is not ill-appearing or toxic-appearing.  HENT:     Head: Normocephalic.     Right Ear: Hearing, tympanic membrane, ear canal and external ear normal.     Left Ear: Hearing, tympanic membrane, ear canal and external ear normal.     Nose: Nose normal.  Eyes:     General: Lids are normal. Lids are everted, no foreign bodies appreciated.     Conjunctiva/sclera: Conjunctivae normal.     Pupils: Pupils are equal, round, and reactive to light.  Neck:     Thyroid: No thyroid mass or thyromegaly.     Vascular: No carotid bruit.     Trachea: Trachea normal.  Cardiovascular:     Rate and Rhythm: Normal rate and regular rhythm.     Heart sounds: Normal heart sounds, S1 normal and S2 normal. No murmur. No gallop.   Pulmonary:     Effort: Pulmonary effort is normal. No respiratory distress.      Breath sounds: Normal breath sounds. No wheezing, rhonchi or rales.  Abdominal:     General: Bowel sounds are normal. There is no distension or abdominal bruit.     Palpations: Abdomen is soft. There is no fluid wave or mass.     Tenderness: There is no abdominal tenderness. There is no guarding or rebound.     Hernia: No hernia is present.  Musculoskeletal:     Cervical back: Normal range of motion and neck supple.  Lymphadenopathy:     Cervical: No cervical adenopathy.  Skin:    General: Skin is warm and dry.     Findings: No rash.  Neurological:     Mental Status: She is alert.     Cranial Nerves: No cranial nerve deficit.     Sensory: No sensory deficit.  Psychiatric:        Mood and Affect: Mood is not anxious or depressed.        Speech: Speech normal.        Behavior: Behavior normal. Behavior is cooperative.        Judgment: Judgment normal.      Assessment and Plan   The patient's preventative maintenance and recommended screening tests for an annual wellness exam were reviewed in full today. Brought up to date unless services declined.  Counselled on the importance of diet, exercise, and its role in overall health and mortality. The patient's FH and SH was reviewed, including their home life, tobacco status, and drug and alcohol status.   Vaccines:up to date, except for td Colon:04/2019, Dr. Fuller Plan, Repeat in 2 years given precancer polyps DVE/pap: total hysterectomy  Mammo:08/2019 DEXA: 08/2019 osteopenia,  She is now sexually active. Not interested in STD testing.   Eliezer Lofts, MD  Carotid artery stenosis  Followed by vascular. Stable last check 05/05/2019  Fibromuscular dysplasia ([place])  Followed by vascular. Stable last check 05/05/2019  HYPERTENSION, BENIGN ESSENTIAL, LABILE Well controlled. Continue current medication.   Hyperlipidemia  Almost at goal LDL < 70 on statin.   Prediabetes  Stable on low carb diet.  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 Stable last check 05/05/2019

## 2019-11-25 NOTE — Assessment & Plan Note (Signed)
Well controlled. Continue current medication.  

## 2019-12-02 ENCOUNTER — Telehealth: Payer: Self-pay

## 2019-12-02 NOTE — Telephone Encounter (Signed)
Call  Problem list includes hyperthyroid...but level in past 4-5 years normal... Has she ever been dx with thyroid disorder.. if so sea moss has a lot of iodine... limit use.   If not past thyroid history, remove hyperthyroid from problem list and she can use it.

## 2019-12-02 NOTE — Telephone Encounter (Signed)
Pt wants to know if it is OK to mix sea moss in with her smoothie; pt wants to be sure she can take sea moss with her other medications. Pt request cb.

## 2019-12-03 NOTE — Telephone Encounter (Signed)
Ms. Mcmorran notified as instructed by telephone.  She states she has never been diagnosed with thyroid disease so I advised her that it is okay for her to use sea moss per Dr. Diona Browner.  Problem list updated.

## 2019-12-17 ENCOUNTER — Ambulatory Visit: Payer: Medicare Other

## 2020-01-04 ENCOUNTER — Other Ambulatory Visit: Payer: Self-pay | Admitting: Family Medicine

## 2020-01-13 NOTE — Progress Notes (Signed)
Kristy Poch T. Adana Marik, [name] [place] at [facility] [place], [address] Phone: [phone]  FAX: [phone]  Kristy [name] - 79 y.o. female  MRN [id number]  Date of Birth: [birth year]  Visit Date: 01/14/2020  PCP: Jinny Sanders, [name]  Referred by: Jinny Sanders, Kristy [name]  Chief Complaint  Patient presents with  . Fall    x 3 weeks  . Arm Pain    Right Elbow    This visit occurred during the SARS-CoV-2 public health emergency.  Safety protocols were in place, including screening questions prior to the visit, additional usage of staff PPE, and extensive cleaning of exam room while observing appropriate contact time as indicated for disinfecting solutions.   Subjective:   Kristy [name] is a 79 y.o. very pleasant female patient with Body mass index is 32.69 kg/m. who presents with the following:  She is a pleasant lady and she reports having a fall approximately 3 weeks ago.  At this point she is having some hip and elbow pain.  Primarily she complains of elbow pain on the right side.  This is concentrated at the elbow itself with minimal pain in the forearm or humerus.  She is not having any lack of motion and she is not having any bruising.  She does have some pinpoint tenderness at the lateral epicondyle.  Right and sore at her bone and a catch in the back. Fell in the bathroom.  Fell on her left arm.  Hit her head.    Pain is really bad.   Has some pain on the L hip.  Uses R arm is a lot worse.     Review of Systems is noted in the HPI, as appropriate   Objective:   BP 140/80   Pulse 68   Temp (!) 97.2 F (36.2 C) (Temporal)   Ht 5\' 2"  (1.575 m)   Wt 178 lb 12 oz (81.1 kg)   SpO2 99%   BMI 32.69 kg/m    GEN: No acute distress; alert,appropriate. PULM: Breathing comfortably in no respiratory distress PSYCH: Normally interactive.   Elbow: R Ecchymosis or edema: neg ROM: full flexion,  extension, pronation, supination Shoulder ROM: Full Flexion: 5/5 Extension: 5/5, PAINFUL Supination: 5/5, PAINFUL Pronation: 5/5 Wrist ext: 5/5 Wrist flexion: 5/5 No gross bony abnormality Varus and Valgus stress: stable ECRB tenderness: YES, TTP Medial epicondyle: NT Lateral epicondyle, acutely tender with tenderness also just caudal grip: 5/5  sensation intact Tinel's, Elbow: negative    Radiology: DG Elbow Complete Right  Result Date: 01/14/2020 CLINICAL DATA:  Right elbow pain since trauma 3 weeks ago. EXAM: RIGHT ELBOW - COMPLETE 3+ VIEW COMPARISON:  None. FINDINGS: There is no fracture or dislocation or joint effusion. Slight arthritic changes with osteophyte formation on the coronoid process of the proximal ulna as well as dystrophic ossification in the origin of the common extensor tendon at the lateral epicondyle of the distal humerus. Small calcified loose body in the joint. IMPRESSION: No acute abnormality. Arthritic changes as described. Electronically Signed   By: Lorriane Shire M.D.   On: 01/14/2020 15:43     Assessment and Plan:     ICD-10-CM   1. Right elbow pain  M25.521 DG Elbow Complete Right   I think that the small ossification laterally based on her clinical exam and history more likely represents a very small avulsion.  We will continue to have her  do range of motion in flexion extension and supination and pronation with follow-up in 3 to 4 weeks  Follow-up: Return in about 1 month (around 02/11/2020).  No orders of the defined types were placed in this encounter.  There are no discontinued medications. Orders Placed This Encounter  Procedures  . DG Elbow Complete Right    Signed,  Orren Pietsch T. Kamila Broda, [name]   Outpatient Encounter Medications as of 01/14/2020  Medication Sig  . aspirin 81 MG chewable tablet Chew by mouth daily.  Marland Kitchen atorvastatin (LIPITOR) 20 MG tablet TAKE 1 TABLET BY MOUTH  DAILY  . chlorthalidone (HYGROTON) 25 MG tablet TAKE 1  TABLET BY MOUTH  DAILY  . KLOR-CON M20 20 MEQ tablet TAKE 1 TABLET BY MOUTH  DAILY  . metoprolol succinate (TOPROL-XL) 100 MG 24 hr tablet TAKE 1 TABLET BY MOUTH  DAILY WITH OR IMMEDIATELY  FOLLOWING A MEAL.  Marland Kitchen NIFEdipine (PROCARDIA XL/NIFEDICAL XL) 60 MG 24 hr tablet TAKE 1 TABLET BY MOUTH  DAILY  . pantoprazole (PROTONIX) 40 MG tablet Take 1 tablet (40 mg total) by mouth daily.   No facility-administered encounter medications on file as of 01/14/2020.

## 2020-01-14 ENCOUNTER — Encounter: Payer: Self-pay | Admitting: Family Medicine

## 2020-01-14 ENCOUNTER — Ambulatory Visit (INDEPENDENT_AMBULATORY_CARE_PROVIDER_SITE_OTHER): Payer: Medicare Other | Admitting: Family Medicine

## 2020-01-14 ENCOUNTER — Other Ambulatory Visit: Payer: Self-pay

## 2020-01-14 ENCOUNTER — Ambulatory Visit (INDEPENDENT_AMBULATORY_CARE_PROVIDER_SITE_OTHER)
Admission: RE | Admit: 2020-01-14 | Discharge: 2020-01-14 | Disposition: A | Discharge status: Home / Self Care | Payer: Medicare Other | Source: Ambulatory Visit | Attending: Family Medicine | Admitting: Family Medicine

## 2020-01-14 VITALS — BP 140/80 | HR 68 | Temp 97.2°F | Ht 62.0 in | Wt 178.8 lb

## 2020-01-14 DIAGNOSIS — M25521 Pain in right elbow: Secondary | ICD-10-CM

## 2020-01-14 DIAGNOSIS — S59901A Unspecified injury of right elbow, initial encounter: Secondary | ICD-10-CM | POA: Diagnosis not present

## 2020-01-15 ENCOUNTER — Encounter: Payer: Self-pay | Admitting: Family Medicine

## 2020-02-10 ENCOUNTER — Ambulatory Visit (INDEPENDENT_AMBULATORY_CARE_PROVIDER_SITE_OTHER): Payer: Medicare Other | Admitting: Family Medicine

## 2020-02-10 ENCOUNTER — Other Ambulatory Visit: Payer: Self-pay

## 2020-02-10 ENCOUNTER — Encounter: Payer: Self-pay | Admitting: Family Medicine

## 2020-02-10 VITALS — BP 128/80 | HR 68 | Temp 97.9°F | Ht 62.0 in | Wt 180.8 lb

## 2020-02-10 DIAGNOSIS — S30820A Blister (nonthermal) of lower back and pelvis, initial encounter: Secondary | ICD-10-CM | POA: Diagnosis not present

## 2020-02-10 NOTE — Patient Instructions (Signed)
We will call with culture results.

## 2020-02-10 NOTE — Assessment & Plan Note (Signed)
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 Eval with viral and bacterial cultures. Review course/treatment of HSV.

## 2020-02-10 NOTE — Progress Notes (Signed)
Chief Complaint  Patient presents with  . Skin Irritation    Buttock area  . Stomach Issues    History of Present Illness: HPI  79 year old female presents with new onset rash on buttocks. Intermittent off and on for years.  Located at top of gluteal crease. Area is itchy, grouping of blisters. No pain.  Treating with cortisone OTC and aloe vera. No discharge.  No fever.  When has it occur she feels somewhat ill, coated mouth, upset stomach.  No new exposures.  No known recent exposures to HSV. Neg HIV testing in past.  This visit occurred during the SARS-CoV-2 public health emergency.  Safety protocols were in place, including screening questions prior to the visit, additional usage of staff PPE, and extensive cleaning of exam room while observing appropriate contact time as indicated for disinfecting solutions.   COVID 19 screen:  No recent travel or known exposure to COVID19 The patient denies respiratory symptoms of COVID 19 at this time. The importance of social distancing was discussed today.     Review of Systems  Constitutional: Negative for chills and fever.  HENT: Negative for congestion and ear pain.   Eyes: Negative for pain and redness.  Respiratory: Negative for cough and shortness of breath.   Cardiovascular: Negative for chest pain, palpitations and leg swelling.  Gastrointestinal: Negative for abdominal pain, blood in stool, constipation, diarrhea, nausea and vomiting.  Genitourinary: Negative for dysuria.  Musculoskeletal: Negative for falls and myalgias.  Skin: Negative for rash.  Neurological: Negative for dizziness.  Psychiatric/Behavioral: Negative for depression. The patient is not nervous/anxious.       Past Medical History:  Diagnosis Date  . Allergy   . 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 Hulda Humphrey of vascular surgery in [place]  . Arthritis    rt knee, foot  . Cataract     forming right eye   . 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 Constipation    uses stool softener PRN- uses once a week to once every 2 weeks   . Fibromuscular dysplasia ([place])   . GERD (gastroesophageal reflux disease)   . Hyperlipidemia   . Hypertension    For 16 years; urinary catecholamines within normal limits 12/09; renal Doppler ultrasound showed no evidence for renal artery stenosis  . Normal echocardiogram 4/10   LVEF 65%; no regional wall motion abnormalities; normal RV size and function; pulmonic valve had increased gradient across w/ peak gradient of about 36 mmHg (range of moderate pulmonic stenosis) followup showed normal valve  . Papilloma of breast    right  . Tinnitus of left ear     reports that she quit smoking about 37 years ago. She has never used smokeless tobacco. She reports that she does not drink alcohol or use drugs.   Current Outpatient Medications:  .  aspirin 81 MG chewable tablet, Chew by mouth daily., Disp: , Rfl:  .  atorvastatin (LIPITOR) 20 MG tablet, TAKE 1 TABLET BY MOUTH  DAILY, Disp: 90 tablet, Rfl: 3 .  chlorthalidone (HYGROTON) 25 MG tablet, TAKE 1 TABLET BY MOUTH  DAILY, Disp: 90 tablet, Rfl: 3 .  KLOR-CON M20 20 MEQ tablet, TAKE 1 TABLET BY MOUTH  DAILY, Disp: 90 tablet, Rfl: 3 .  metoprolol succinate (TOPROL-XL) 100 MG 24 hr tablet, TAKE 1 TABLET BY MOUTH  DAILY WITH OR IMMEDIATELY  FOLLOWING A  MEAL., Disp: 90 tablet, Rfl: 3 .  NIFEdipine (PROCARDIA XL/NIFEDICAL XL) 60 MG 24 hr tablet, TAKE 1 TABLET BY MOUTH  DAILY, Disp: 90 tablet, Rfl: 3 .  pantoprazole (PROTONIX) 40 MG tablet, Take 1 tablet (40 mg total) by mouth daily., Disp: 90 tablet, Rfl: 3   Observations/Objective: Blood pressure 128/80, pulse 68, temperature 97.9 F (36.6 C), temperature source Temporal, height 5\' 2"  (1.575 m), weight 180 lb 12 oz (82 kg), SpO2 98 %.  Physical Exam Constitutional:      General: She is not in acute  distress.    Appearance: Normal appearance. She is well-developed. She is not ill-appearing or toxic-appearing.  HENT:     Head: Normocephalic.     Right Ear: Hearing, tympanic membrane, ear canal and external ear normal. Tympanic membrane is not erythematous, retracted or bulging.     Left Ear: Hearing, tympanic membrane, ear canal and external ear normal. Tympanic membrane is not erythematous, retracted or bulging.     Nose: No mucosal edema or rhinorrhea.     Right Sinus: No maxillary sinus tenderness or frontal sinus tenderness.     Left Sinus: No maxillary sinus tenderness or frontal sinus tenderness.     Mouth/Throat:     Pharynx: Uvula midline.  Eyes:     General: Lids are normal. Lids are everted, no foreign bodies appreciated.     Conjunctiva/sclera: Conjunctivae normal.     Pupils: Pupils are equal, round, and reactive to light.  Neck:     Thyroid: No thyroid mass or thyromegaly.     Vascular: No carotid bruit.     Trachea: Trachea normal.  Cardiovascular:     Rate and Rhythm: Normal rate and regular rhythm.     Pulses: Normal pulses.     Heart sounds: Normal heart sounds, S1 normal and S2 normal. No murmur. No friction rub. No gallop.   Pulmonary:     Effort: Pulmonary effort is normal. No tachypnea or respiratory distress.     Breath sounds: Normal breath sounds. No decreased breath sounds, wheezing, rhonchi or rales.  Abdominal:     General: Bowel sounds are normal.     Palpations: Abdomen is soft.     Tenderness: There is no abdominal tenderness.  Musculoskeletal:     Cervical back: Normal range of motion and neck supple.  Skin:    General: Skin is warm and dry.     Findings: No rash.     Comments: Dried healing blistering rash at top of gluteal crease... area unroofed and cultured  Neurological:     Mental Status: She is alert.  Psychiatric:        Mood and Affect: Mood is not anxious or depressed.        Speech: Speech normal.        Behavior: Behavior normal.  Behavior is cooperative.        Thought Content: Thought content normal.        Judgment: Judgment normal.      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 Eval with viral and bacterial cultures. Review course/treatment of HSV.     Eliezer Lofts, MD

## 2020-02-12 ENCOUNTER — Ambulatory Visit: Payer: Medicare Other | Admitting: Family Medicine

## 2020-02-13 ENCOUNTER — Telehealth: Payer: Self-pay

## 2020-02-13 DIAGNOSIS — B0089 Other herpesviral infection: Secondary | ICD-10-CM | POA: Insufficient documentation

## 2020-02-13 DIAGNOSIS — B009 Herpesviral infection, unspecified: Secondary | ICD-10-CM

## 2020-02-13 LAB — HERPES SIMPLEX VIRUS CULTURE: MICRO NUMBER:: 10282431

## 2020-02-13 LAB — WOUND CULTURE
GRAM STAIN:: NONE SEEN
MICRO NUMBER:: 10282432
RESULT:: NO GROWTH
SPECIMEN QUALITY:: ADEQUATE

## 2020-02-13 MED ORDER — VALACYCLOVIR HCL 1 G PO TABS: ORAL_TABLET | ORAL | 0 refills | Status: DC

## 2020-02-13 NOTE — Telephone Encounter (Signed)
Called patient and discussed diagnosis and treatment in detail. Questions answered.

## 2020-02-13 NOTE — Telephone Encounter (Addendum)
Spoke with [name].  She got her results on MyChart and she is devastated.  She would like Dr. Diona Browner to give her to discuss results further.  Would like Rx [facility] [address].

## 2020-02-13 NOTE — Telephone Encounter (Signed)
Patient wanted to speak with Dr Rometta Emery nurse in regards to what she was seen on her last visit for. Patient would not give me details just said she just really needs to speak with Dr Rometta Emery nurse or Dr Diona Browner. She is ok with a call back on Monday or next week if not today. [phone]

## 2020-02-13 NOTE — Addendum Note (Signed)
Addended by: Eliezer Lofts E on: 02/13/2020 05:34 PM   Modules accepted: Orders

## 2020-02-17 NOTE — Telephone Encounter (Signed)
Pt left v/m wanting to ck on status of refill.

## 2020-02-17 NOTE — Telephone Encounter (Signed)
Left message for Mrs. Penick that Dr. Diona Browner sent in the prescription for the new medication that we discussed on 02/13/2020 to [place].  I ask that she check with her pharmacy and call me back if this is not what see was calling about.

## 2020-03-10 ENCOUNTER — Telehealth: Payer: Self-pay

## 2020-03-10 DIAGNOSIS — E876 Hypokalemia: Secondary | ICD-10-CM

## 2020-03-10 DIAGNOSIS — E538 Deficiency of other specified B group vitamins: Secondary | ICD-10-CM

## 2020-03-10 NOTE — Telephone Encounter (Addendum)
Pt left v/m; pt last annual 11/2019 and pt did not have Vit B 12 checked at that time. Pt said her symptoms are returning for low Vit B 12; pt also concerned about hx of low K. Pt wants to have Vit B 12 and K checked q 6 months instead of yearly to keep a close check on these 2 test. Please advise. 04/17/19 K 3.4 and 11/18/19 K 3.2. on 04/17/19 Vit B 12 was 161. Pt last annual exam on 11/25/19.

## 2020-03-10 NOTE — Telephone Encounter (Signed)
Ms. Skubic notified as instructed by telephone.  Lab appointment scheduled for 03/11/2020 at 12:00 pm.

## 2020-03-10 NOTE — Telephone Encounter (Signed)
Okay to have B12 and BMET to check potassium checked every 6 months.  Start with lab appt now to check both unless she wants to wait until June. Have her make lab appt and put BMET and B12 in notes.

## 2020-03-11 ENCOUNTER — Other Ambulatory Visit (INDEPENDENT_AMBULATORY_CARE_PROVIDER_SITE_OTHER): Payer: Medicare Other

## 2020-03-11 DIAGNOSIS — E538 Deficiency of other specified B group vitamins: Secondary | ICD-10-CM | POA: Diagnosis not present

## 2020-03-11 DIAGNOSIS — E876 Hypokalemia: Secondary | ICD-10-CM

## 2020-03-11 LAB — BASIC METABOLIC PANEL
BUN: 14 mg/dL (ref 6–23)
CO2: 31 mEq/L (ref 19–32)
Calcium: 9.6 mg/dL (ref 8.4–10.5)
Chloride: 101 mEq/L (ref 96–112)
Creatinine, Ser: 0.7 mg/dL (ref 0.40–1.20)
GFR: 98.24 mL/min (ref >60.00)
Glucose, Bld: 110 mg/dL — ABNORMAL HIGH (ref 70–99)
Potassium: 3 mEq/L — ABNORMAL LOW (ref 3.5–5.1)
Sodium: 140 mEq/L (ref 135–145)

## 2020-03-11 LAB — VITAMIN B12: Vitamin B-12: 462 pg/mL (ref 211–911)

## 2020-03-16 ENCOUNTER — Other Ambulatory Visit: Payer: Self-pay | Admitting: Family Medicine

## 2020-03-17 ENCOUNTER — Other Ambulatory Visit: Payer: Self-pay | Admitting: Family Medicine

## 2020-03-17 MED ORDER — TRIAMTERENE-HCTZ 37.5-25 MG PO TABS: 1.0000 | ORAL_TABLET | Freq: Every day | ORAL | 11 refills | Status: DC

## 2020-03-30 ENCOUNTER — Telehealth: Payer: Self-pay | Admitting: Family Medicine

## 2020-03-30 DIAGNOSIS — E876 Hypokalemia: Secondary | ICD-10-CM

## 2020-03-30 NOTE — Telephone Encounter (Signed)
-----   Message from Cloyd Stagers, RT sent at 03/18/2020  2:44 PM EDT ----- Regarding: Lab Orders for Wednesday 5.12.2021 Please place lab orders for Wednesday 5.12.2021, appt notes state "Potassium and magnesium" Thank you, Dyke Maes RT(R)

## 2020-03-31 ENCOUNTER — Other Ambulatory Visit: Payer: Self-pay

## 2020-03-31 ENCOUNTER — Other Ambulatory Visit (INDEPENDENT_AMBULATORY_CARE_PROVIDER_SITE_OTHER): Payer: Medicare Other

## 2020-03-31 DIAGNOSIS — E876 Hypokalemia: Secondary | ICD-10-CM

## 2020-03-31 LAB — MAGNESIUM: Magnesium: 1.8 mg/dL (ref 1.5–2.5)

## 2020-03-31 LAB — POTASSIUM: Potassium: 3.8 mEq/L (ref 3.5–5.1)

## 2020-04-26 ENCOUNTER — Other Ambulatory Visit: Payer: Self-pay | Admitting: *Deleted

## 2020-04-26 MED ORDER — TRIAMTERENE-HCTZ 37.5-25 MG PO TABS: 1.0000 | ORAL_TABLET | Freq: Every day | ORAL | 2 refills | Status: DC

## 2020-04-26 NOTE — Telephone Encounter (Signed)
Pt left message at Triage she needed Maxzide sent to mail order pharmacy. Done and pt aware

## 2020-05-14 DIAGNOSIS — H0288A Meibomian gland dysfunction right eye, upper and lower eyelids: Secondary | ICD-10-CM | POA: Diagnosis not present

## 2020-05-14 DIAGNOSIS — H25013 Cortical age-related cataract, bilateral: Secondary | ICD-10-CM | POA: Diagnosis not present

## 2020-05-14 DIAGNOSIS — H04123 Dry eye syndrome of bilateral lacrimal glands: Secondary | ICD-10-CM | POA: Diagnosis not present

## 2020-05-14 DIAGNOSIS — H2513 Age-related nuclear cataract, bilateral: Secondary | ICD-10-CM | POA: Diagnosis not present

## 2020-05-14 DIAGNOSIS — H52213 Irregular astigmatism, bilateral: Secondary | ICD-10-CM | POA: Diagnosis not present

## 2020-05-14 DIAGNOSIS — H0288B Meibomian gland dysfunction left eye, upper and lower eyelids: Secondary | ICD-10-CM | POA: Diagnosis not present

## 2020-05-17 ENCOUNTER — Telehealth: Payer: Self-pay

## 2020-05-17 NOTE — Telephone Encounter (Signed)
Pt calling to speak with Butch Penny CMA about personal question for Dr Diona Browner. I advised that Butch Penny CMA was with pts and I was a nurse and would be glad to help her. Pt said no she would cb when she could speak with Butch Penny CMA. Pt said cb today or tomorrow would be OK. FYI to [facility].

## 2020-05-17 NOTE — Telephone Encounter (Signed)
Spoke with Kristy [name].  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  She states she doesn't want to pump her body full of medication and is asking for alternative treatment recommendations.  Please advise.

## 2020-05-18 MED ORDER — VALACYCLOVIR HCL 500 MG PO TABS: 500.0000 mg | ORAL_TABLET | Freq: Every day | ORAL | 11 refills | Status: DC

## 2020-05-18 NOTE — Telephone Encounter (Signed)
If happening every 2 weeks.Marland Kitchen other option to prevent frequent recurrences is to start daily LOW dose valacyclovir

## 2020-05-18 NOTE — Telephone Encounter (Signed)
Ms. Lashway notified as instructed by telephone.  She is agreeable to starting a daily low dose valacyclovir.  [facility] [address].  She also ask if it was okay that she get the Shingrix vaccine.  I advised it should be fine for her to get the vaccine.

## 2020-05-18 NOTE — Telephone Encounter (Signed)
Agree shingrix vaccine fine.  Sent in 500 mg daily valacyclovir

## 2020-05-31 IMAGING — MG DIGITAL SCREENING BILAT W/ CAD
4 series · 4 of 4 positions shown · non-contrast
Comparison: Previous exam(s).

CLINICAL DATA: Screening.

EXAM:
DIGITAL SCREENING BILATERAL MAMMOGRAM WITH CAD

[L CC]
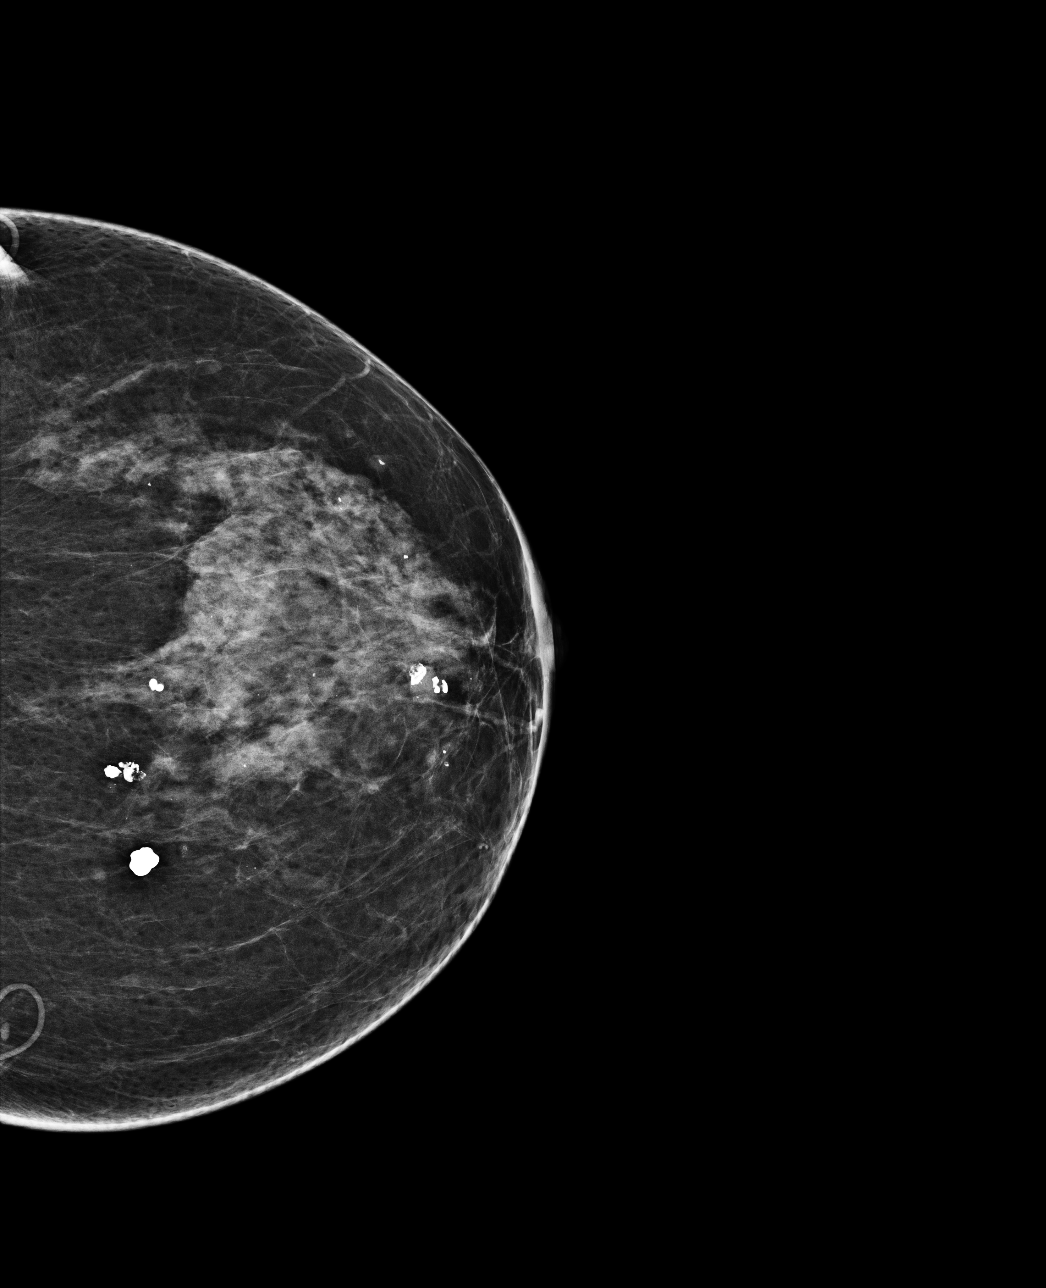

[L MLO]
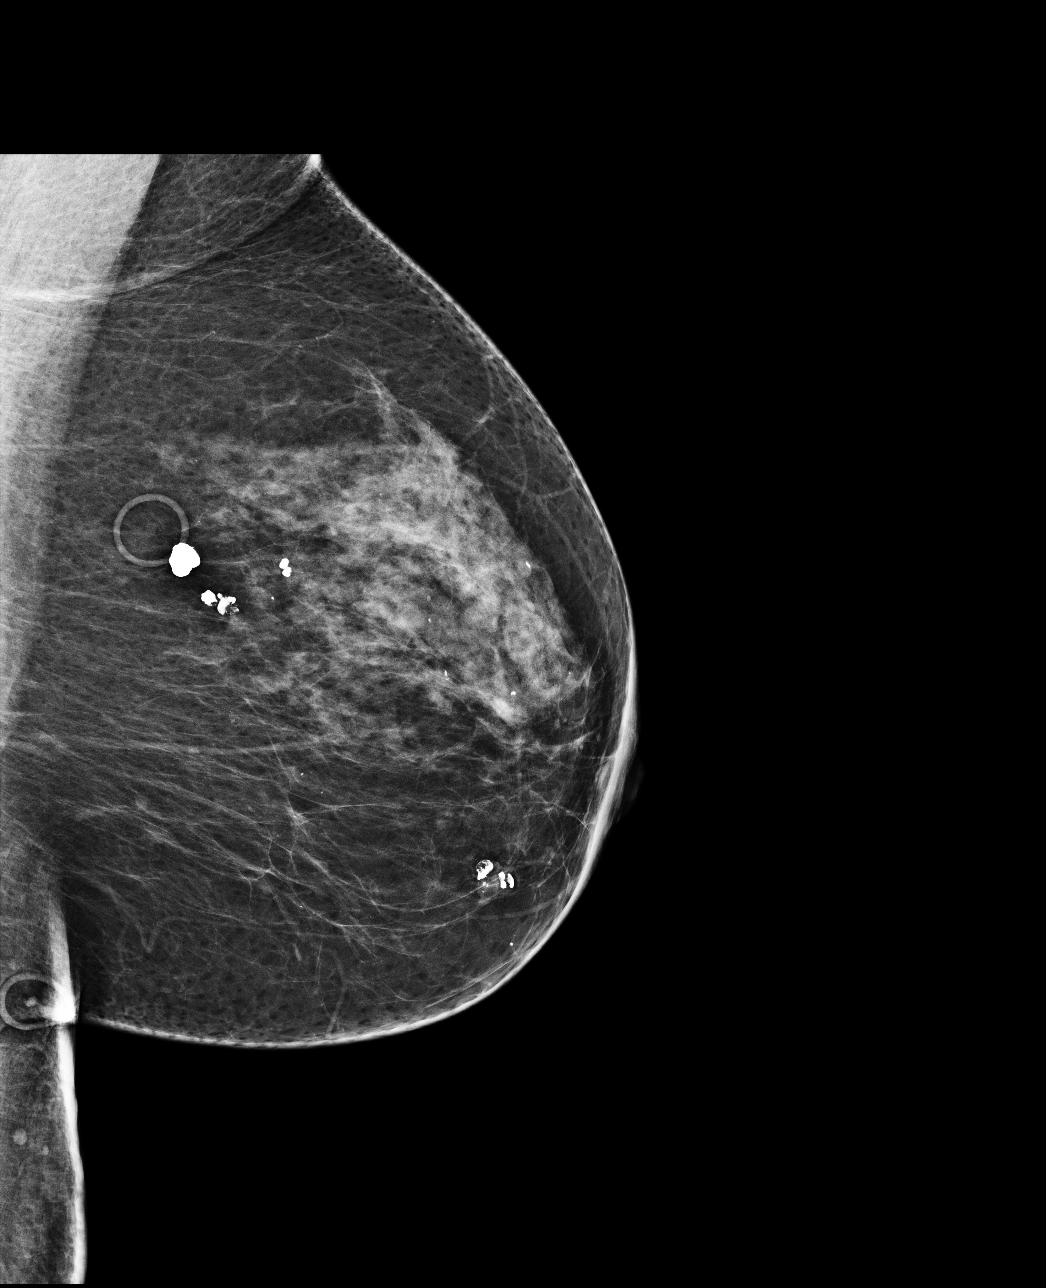

[R CC]
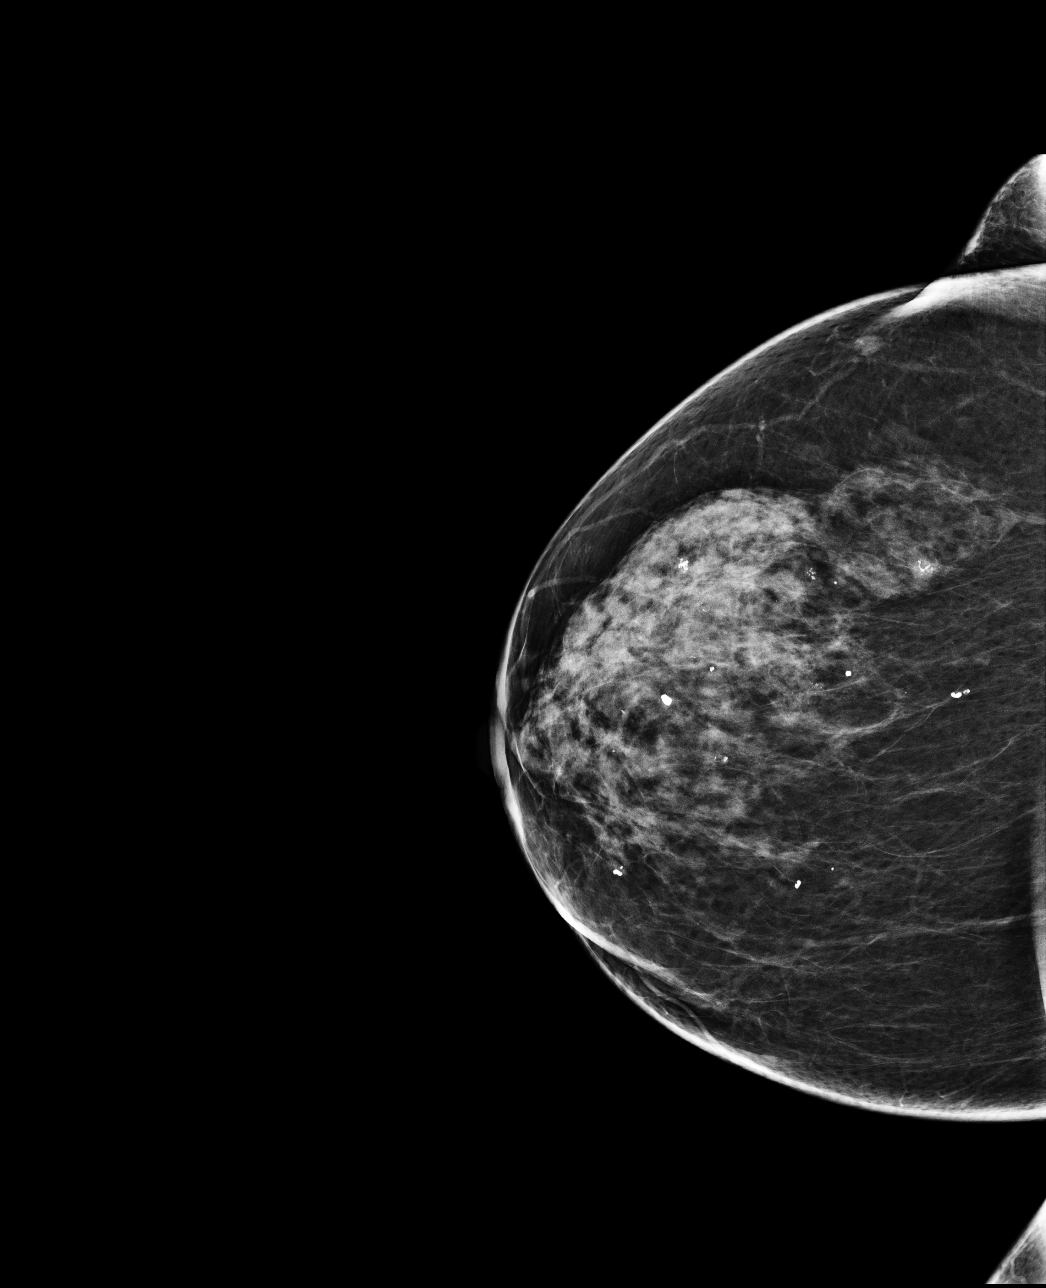

[R MLO]
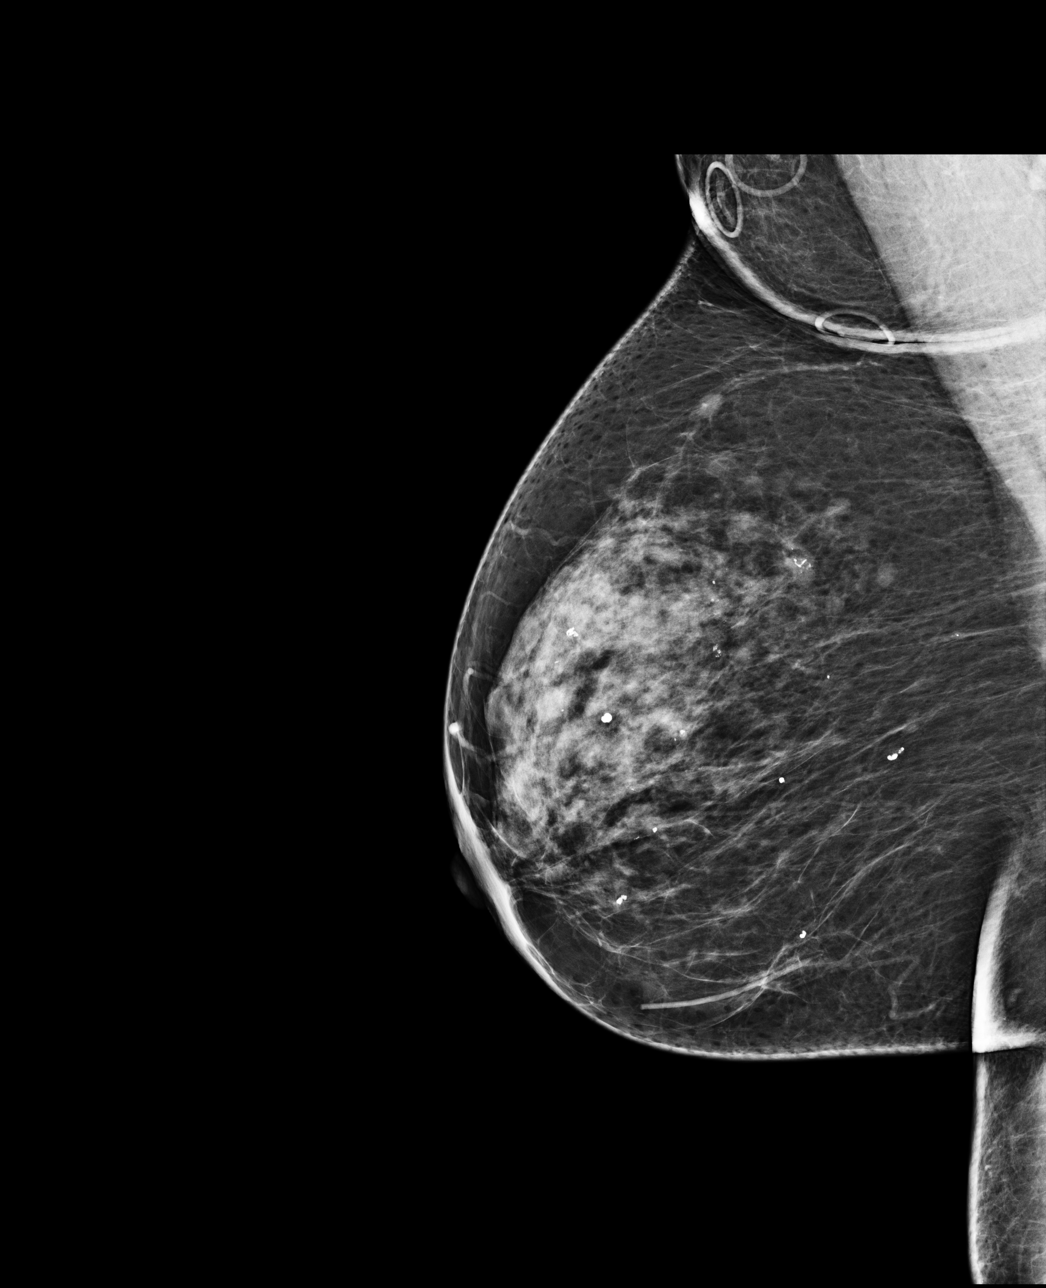

[4 of 4 positions shown; findings below may reference images not displayed]

ACR Breast Density Category c: The breast tissue is heterogeneously
dense, which may obscure small masses.
FINDINGS: There are no findings suspicious for malignancy. Images were
processed with CAD.
IMPRESSION: No mammographic evidence of malignancy. A result letter of this
screening mammogram will be mailed directly to the patient.

RECOMMENDATION:
Screening mammogram in one year. (Code:[id number])

BI-RADS CATEGORY  1: Negative.

## 2020-06-29 ENCOUNTER — Telehealth: Payer: Self-pay | Admitting: *Deleted

## 2020-06-29 NOTE — Telephone Encounter (Signed)
Agree with starting back B12. Have her make appt for eval and likely labs.

## 2020-06-29 NOTE — Telephone Encounter (Signed)
Patient left a voicemail stating that she left a message on Friday because her dizzy spells have come back. Patient stated that she has not heard anything back about this and requested a call back. No record of message being left Friday. Spoke to patient and was advised that she is feeling a little better today. Patient that she is having some dizzy spells especially when she bends down to pick something up. Patient stated the last time she was having these dizzy spells her B-12 level was low. Patient stated that she has started back on an OTC  B-12 supplement. Patient stated that she is on a plant base diet. Patient wants to know if she should have some lab work done or what Dr. Diona Browner recommends that she do.

## 2020-06-29 NOTE — Telephone Encounter (Signed)
Robin,  Please schedule office visit with Dr. Diona Browner.

## 2020-06-30 NOTE — Telephone Encounter (Signed)
8/17 appointment Pt aware

## 2020-07-02 ENCOUNTER — Ambulatory Visit (INDEPENDENT_AMBULATORY_CARE_PROVIDER_SITE_OTHER): Payer: Medicare Other | Admitting: Family Medicine

## 2020-07-02 ENCOUNTER — Other Ambulatory Visit: Payer: Self-pay

## 2020-07-02 VITALS — BP 120/68 | HR 85 | Temp 96.2°F | Ht 62.0 in | Wt 175.8 lb

## 2020-07-02 DIAGNOSIS — R42 Dizziness and giddiness: Secondary | ICD-10-CM | POA: Diagnosis not present

## 2020-07-02 LAB — CBC WITH DIFFERENTIAL/PLATELET
Basophils Absolute: 0.1 10*3/uL (ref 0.0–0.1)
Basophils Relative: 1 % (ref 0.0–3.0)
Eosinophils Absolute: 0.2 10*3/uL (ref 0.0–0.7)
Eosinophils Relative: 2.3 % (ref 0.0–5.0)
HCT: 40.8 % (ref 36.0–46.0)
Hemoglobin: 13.8 g/dL (ref 12.0–15.0)
Lymphocytes Relative: 47.3 % — ABNORMAL HIGH (ref 12.0–46.0)
Lymphs Abs: 3.6 10*3/uL (ref 0.7–4.0)
MCHC: 33.9 g/dL (ref 30.0–36.0)
MCV: 97.6 fl (ref 78.0–100.0)
Monocytes Absolute: 0.5 10*3/uL (ref 0.1–1.0)
Monocytes Relative: 7.2 % (ref 3.0–12.0)
Neutro Abs: 3.2 10*3/uL (ref 1.4–7.7)
Neutrophils Relative %: 42.2 % — ABNORMAL LOW (ref 43.0–77.0)
Platelets: 333 10*3/uL (ref 150.0–400.0)
RBC: 4.18 Mil/uL (ref 3.87–5.11)
RDW: 13.7 % (ref 11.5–15.5)
WBC: 7.5 10*3/uL (ref 4.0–10.5)

## 2020-07-02 LAB — COMPREHENSIVE METABOLIC PANEL
ALT: 13 U/L (ref 0–35)
AST: 15 U/L (ref 0–37)
Albumin: 4.5 g/dL (ref 3.5–5.2)
Alkaline Phosphatase: 66 U/L (ref 39–117)
BUN: 17 mg/dL (ref 6–23)
CO2: 27 mEq/L (ref 19–32)
Calcium: 10 mg/dL (ref 8.4–10.5)
Chloride: 101 mEq/L (ref 96–112)
Creatinine, Ser: 0.85 mg/dL (ref 0.40–1.20)
GFR: 78.45 mL/min (ref >60.00)
Glucose, Bld: 122 mg/dL — ABNORMAL HIGH (ref 70–99)
Potassium: 3.8 mEq/L (ref 3.5–5.1)
Sodium: 139 mEq/L (ref 135–145)
Total Bilirubin: 0.4 mg/dL (ref 0.2–1.2)
Total Protein: 7.3 g/dL (ref 6.0–8.3)

## 2020-07-02 LAB — VITAMIN B12: Vitamin B-12: 329 pg/mL (ref 211–911)

## 2020-07-02 LAB — TSH: TSH: 2.84 u[IU]/mL (ref 0.35–4.50)

## 2020-07-02 NOTE — Patient Instructions (Signed)
Stay on B12 1000 mcg daily.   Please stop at the lab to have labs drawn.

## 2020-07-02 NOTE — Assessment & Plan Note (Signed)
Eval with labs.   On plant based diet.. B12 may be low again.   Not clearly BPPV .  NO clear worsening of carotid stenosis on right, S/P CEA on left. If persistent symptoms consider further eval.

## 2020-07-02 NOTE — Progress Notes (Signed)
Chief Complaint  Patient presents with  . Dizziness    possible low b12    History of Present Illness: HPI   79 year old female  with history of HTN, renal fibrodysplasia, carotid stenosis,s/p left CEA , BPPV presents with dizziness. She reports  Since 06/22/2020.. she woke up in the Am felt.. woozy.  Bending over made her feel  Like equilibrium was off.  Feeling tired. No CP, no fever, no SOB.  No numbness weakness, no weakness, no  Slurred speech    BP occ low 98/58... feels okay when BP at that level.   B12 low in past.. she has felt similar  BP Readings from Last 3 Encounters:  07/02/20 120/68  02/10/20 128/80  01/14/20 140/80     This visit occurred during the SARS-CoV-2 public health emergency.  Safety protocols were in place, including screening questions prior to the visit, additional usage of staff PPE, and extensive cleaning of exam room while observing appropriate contact time as indicated for disinfecting solutions.   COVID 19 screen:  No recent travel or known exposure to COVID19 The patient denies respiratory symptoms of COVID 19 at this time. The importance of social distancing was discussed today.     ROS    Past Medical History:  Diagnosis Date  . Allergy   . 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 Hulda Humphrey of vascular surgery in [place]  . Arthritis    rt knee, foot  . Cataract    forming right eye   . 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 Constipation    uses stool softener PRN- uses once a week to once every 2 weeks   . Fibromuscular dysplasia ([place])   . GERD (gastroesophageal reflux disease)   . Hyperlipidemia   . Hypertension    For 16 years; urinary catecholamines within normal limits 12/09; renal Doppler ultrasound showed no evidence for renal artery stenosis  . Normal echocardiogram 4/10   LVEF 65%; no regional wall motion  abnormalities; normal RV size and function; pulmonic valve had increased gradient across w/ peak gradient of about 36 mmHg (range of moderate pulmonic stenosis) followup showed normal valve  . Papilloma of breast    right  . Tinnitus of left ear     reports that she quit smoking about 37 years ago. She has never used smokeless tobacco. She reports that she does not drink alcohol and does not use drugs.   Current Outpatient Medications:  .  aspirin 81 MG chewable tablet, Chew by mouth daily., Disp: , Rfl:  .  atorvastatin (LIPITOR) 20 MG tablet, TAKE 1 TABLET BY MOUTH  DAILY, Disp: 90 tablet, Rfl: 3 .  metoprolol succinate (TOPROL-XL) 100 MG 24 hr tablet, TAKE 1 TABLET BY MOUTH  DAILY WITH OR IMMEDIATELY  FOLLOWING A MEAL., Disp: 90 tablet, Rfl: 3 .  NIFEdipine (PROCARDIA XL/NIFEDICAL XL) 60 MG 24 hr tablet, TAKE 1 TABLET BY MOUTH  DAILY, Disp: 90 tablet, Rfl: 3 .  pantoprazole (PROTONIX) 40 MG tablet, Take 1 tablet (40 mg total) by mouth daily. (Patient taking differently: Take 40 mg by mouth as needed. ), Disp: 90 tablet, Rfl: 3 .  triamterene-hydrochlorothiazide (MAXZIDE-25) 37.5-25 MG tablet, Take 1 tablet by mouth daily., Disp: 90 tablet, Rfl: 2 .  valACYclovir (VALTREX) 500 MG tablet, Take 1 tablet (500 mg total) by mouth daily. Take daily for suppressive therapy (Patient  taking differently: Take 500 mg by mouth as needed. Take daily for suppressive therapy), Disp: 30 tablet, Rfl: 11   Observations/Objective: Blood pressure 120/68, pulse 85, temperature (!) 96.2 F (35.7 C), temperature source Temporal, height 5\' 2"  (1.575 m), weight 175 lb 12 oz (79.7 kg), SpO2 96 %.  Physical Exam Constitutional:      General: She is not in acute distress.    Appearance: Normal appearance. She is well-developed. She is not ill-appearing or toxic-appearing.  HENT:     Head: Normocephalic.     Right Ear: Hearing, tympanic membrane, ear canal and external ear normal. Tympanic membrane is not  erythematous, retracted or bulging.     Left Ear: Hearing, tympanic membrane, ear canal and external ear normal. Tympanic membrane is not erythematous, retracted or bulging.     Nose: No mucosal edema or rhinorrhea.     Right Sinus: No maxillary sinus tenderness or frontal sinus tenderness.     Left Sinus: No maxillary sinus tenderness or frontal sinus tenderness.     Mouth/Throat:     Pharynx: Uvula midline.  Eyes:     General: Lids are normal. Lids are everted, no foreign bodies appreciated.     Conjunctiva/sclera: Conjunctivae normal.     Pupils: Pupils are equal, round, and reactive to light.  Neck:     Thyroid: No thyroid mass or thyromegaly.     Vascular: No carotid bruit.     Trachea: Trachea normal.  Cardiovascular:     Rate and Rhythm: Normal rate and regular rhythm.     Pulses: Normal pulses.     Heart sounds: Normal heart sounds, S1 normal and S2 normal. No murmur heard.  No friction rub. No gallop.   Pulmonary:     Effort: Pulmonary effort is normal. No tachypnea or respiratory distress.     Breath sounds: Normal breath sounds. No decreased breath sounds, wheezing, rhonchi or rales.  Abdominal:     General: Bowel sounds are normal.     Palpations: Abdomen is soft.     Tenderness: There is no abdominal tenderness.  Musculoskeletal:     Cervical back: Normal range of motion and neck supple.  Skin:    General: Skin is warm and dry.     Findings: No rash.  Neurological:     Mental Status: She is alert and oriented to person, place, and time.     GCS: GCS eye subscore is 4. GCS verbal subscore is 5. GCS motor subscore is 6.     Cranial Nerves: No cranial nerve deficit.     Sensory: No sensory deficit.     Motor: No abnormal muscle tone.     Coordination: Coordination normal.     Gait: Gait normal.     Deep Tendon Reflexes: Reflexes are normal and symmetric.     Comments: Nml cerebellar exam   No papilledema  Psychiatric:        Mood and Affect: Mood is not anxious  or depressed.        Speech: Speech normal.        Behavior: Behavior normal. Behavior is cooperative.        Thought Content: Thought content normal.        Cognition and Memory: Memory is not impaired. She does not exhibit impaired recent memory or impaired remote memory.        Judgment: Judgment normal.     Neg modified [place].. no dizziness/vertigo triggered with standing or head movements  Assessment and Plan  Dizziness  Eval with labs.   On plant based diet.. B12 may be low again.   Not clearly BPPV .  NO clear worsening of carotid stenosis on right, S/P CEA on left. If persistent symptoms consider further eval.      Eliezer Lofts, MD

## 2020-07-06 ENCOUNTER — Telehealth: Payer: Self-pay | Admitting: *Deleted

## 2020-07-06 ENCOUNTER — Ambulatory Visit: Payer: Medicare Other | Admitting: Family Medicine

## 2020-07-06 NOTE — Telephone Encounter (Signed)
Patient left a voicemail stating that she got the message from Dr. Diona Browner about possibly starting B-12 shots. Patient stated that she has checked with her insurance company and they will cover the B-12 shot if she is anemic or in dire need of the shot.  Patient stated that she is okay with scheduling the shot here at the office if it will be cheap for her to get them here.

## 2020-07-06 NOTE — Telephone Encounter (Signed)
She is not anemia and is not  really in " dire need of the shot" but I am not sure what they mean by that. Have her start B12 1000 mg daily x 12 weeks then schedule recheck B12 lab only visit.

## 2020-07-07 NOTE — Telephone Encounter (Signed)
Ms. Silberstein notified as instructed by telephone.  Patient states understanding.  

## 2020-07-16 ENCOUNTER — Telehealth: Payer: Self-pay | Admitting: *Deleted

## 2020-07-16 NOTE — Telephone Encounter (Signed)
Pt left VM at Triage:  She just has a question regarding her BP. He diastolic # is in the 23C when sitting and she wants to make sure that's okay and ask "why" it's doing that. Pt said when she's sitting she has checked and the last 2 diastolic #s were 58 and 57, pt said she checked it when she laid down for bed and it was 115/54, pt wants to know should she be concerned about BP

## 2020-07-16 NOTE — Telephone Encounter (Signed)
Correction.. meant to type systolic < 391 ( not 792)

## 2020-07-16 NOTE — Telephone Encounter (Signed)
Sometimes bottom number can bee lower when treating top number to goal. She does not need to be worried  but if top number remains as well controlled as 155 over the next few weeks.Marland Kitchen we can try decreasing her medication.

## 2020-07-16 NOTE — Telephone Encounter (Signed)
Ms. Panozzo notified as instructed by telephone.  Patient states understanding.  

## 2020-08-07 DIAGNOSIS — Z20822 Contact with and (suspected) exposure to covid-19: Secondary | ICD-10-CM | POA: Diagnosis not present

## 2020-08-23 ENCOUNTER — Encounter: Payer: Self-pay | Admitting: Family Medicine

## 2020-08-23 ENCOUNTER — Ambulatory Visit (INDEPENDENT_AMBULATORY_CARE_PROVIDER_SITE_OTHER)
Admission: RE | Admit: 2020-08-23 | Discharge: 2020-08-23 | Disposition: A | Discharge status: Home / Self Care | Payer: Medicare Other | Source: Ambulatory Visit | Attending: Family Medicine | Admitting: Family Medicine

## 2020-08-23 ENCOUNTER — Other Ambulatory Visit: Payer: Self-pay

## 2020-08-23 ENCOUNTER — Ambulatory Visit (INDEPENDENT_AMBULATORY_CARE_PROVIDER_SITE_OTHER): Payer: Medicare Other | Admitting: Family Medicine

## 2020-08-23 VITALS — BP 130/72 | HR 72 | Temp 97.7°F | Ht 62.0 in | Wt 178.5 lb

## 2020-08-23 DIAGNOSIS — M170 Bilateral primary osteoarthritis of knee: Secondary | ICD-10-CM

## 2020-08-23 DIAGNOSIS — M1712 Unilateral primary osteoarthritis, left knee: Secondary | ICD-10-CM | POA: Diagnosis not present

## 2020-08-23 DIAGNOSIS — M1711 Unilateral primary osteoarthritis, right knee: Secondary | ICD-10-CM

## 2020-08-23 DIAGNOSIS — M25461 Effusion, right knee: Secondary | ICD-10-CM | POA: Diagnosis not present

## 2020-08-23 DIAGNOSIS — M25562 Pain in left knee: Secondary | ICD-10-CM | POA: Diagnosis not present

## 2020-08-23 NOTE — Progress Notes (Signed)
Kristy [name] T. Florette Thai, MD, [place]  Primary Care and [place] at [facility] [place], [address]  Phone: [phone]  FAX: [phone]  [name] - 79 y.o. female  MRN [id number]  Date of Birth: February 14, 1941  Date: 08/23/2020  PCP: Jinny Sanders, MD  Referral: Jinny Sanders, MD  Chief Complaint  Patient presents with  . Knee Pain    Right    This visit occurred during the SARS-CoV-2 public health emergency.  Safety protocols were in place, including screening questions prior to the visit, additional usage of staff PPE, and extensive cleaning of exam room while observing appropriate contact time as indicated for disinfecting solutions.   Subjective:   Kristy [name] is a 79 y.o. very pleasant female patient with Body mass index is 32.65 kg/m. who presents with the following:  B knee pain and OA: She is a very nice lady, not member quite well.  She is here today to talk about primarily her right knee, but also of her left knee to a lesser extent.  She has had some ongoing right knee pain going on for multiple years and she did have a prior right knee arthroscopy and partial meniscectomy done in approximately 2013 by Dr. Noemi Chapel.  She has had some progressive knee pain over the years and she has difficulty with standing up, getting in and out of a chair, and going up and down stairs.  She does have pain overall in the right side, occasional effusions, and her pain is more on the lateral aspect of the right knee.  Review of Systems is noted in the HPI, as appropriate   Objective:   BP 130/72   Pulse 72   Temp 97.7 F (36.5 C) (Temporal)   Ht 5\' 2"  (1.575 m)   Wt 178 lb 8 oz (81 kg)   SpO2 97%   BMI 32.65 kg/m    GEN: No acute distress; alert,appropriate. PULM: Breathing comfortably in no respiratory distress PSYCH: Normally interactive.    Right knee: She lacks 6 degrees of extension and  she has flexion to 97 degrees.  Mild effusion.  Stable to varus and valgus stress.  ACL, PCL, MCL, and LCL are all stable.  Lateral greater than medial joint line tenderness.  Significant both.  Any form of forced flexion at all causes some very significant pain.  Left knee with full range of motion, flexion to 120 and no pain with provocative maneuvers at all and ligaments are stable.  Radiology: DG Knee 4 Views W/Patella Left  Result Date: 08/24/2020 CLINICAL DATA:  Pain EXAM: LEFT KNEE - COMPLETE 4+ VIEW COMPARISON:  Mar 21, 2016 FINDINGS: No acute fracture or dislocation. Joint spaces and alignment are maintained. No area of erosion or osseous destruction. No unexpected radiopaque foreign body. Soft tissues are unremarkable. IMPRESSION: Unremarkable LEFT knee radiograph. Electronically Signed   By: Valentino Saxon MD   On: 08/24/2020 09:15   DG Knee 4 Views W/Patella Right  Result Date: 08/24/2020 CLINICAL DATA:  Pain EXAM: RIGHT KNEE - COMPLETE 4+ VIEW COMPARISON:  Mar 21, 2016 FINDINGS: No acute fracture or dislocation. There are advanced degenerative changes of the lateral compartment with bone-on-bone apposition on the [place] weight-bearing view, subcortical sclerosis, and osseous remodeling. There are moderate degenerative changes of the patellofemoral compartment. No area of erosion or osseous destruction. No unexpected radiopaque foreign body. Small joint effusion. IMPRESSION: Advanced degenerative changes of the  right knee in the lateral compartment. Electronically Signed   By: Valentino Saxon MD   On: 08/24/2020 09:15     Assessment and Plan:     ICD-10-CM   1. Primary osteoarthritis of knees, bilateral  M17.0   2. Primary osteoarthritis of right knee  M17.11 DG Knee 4 Views W/Patella Right  3. Primary osteoarthritis of left knee  M17.12 DG Knee 4 Views W/Patella Left   Total encounter time: 30 minutes. This includes total time spent on the day of encounter.  Additional time  spent on chart review, review of prior films, MRI, notes in the epic system.  Additional time with face-to-face discussion of x-rays.  On the right knee the patient has end-stage osteoarthritis in the lateral compartment with bone-on-bone pathology and significant subchondral sclerosis and osteophytosis.  She also has some significant patellofemoral osteoarthritis and lateral compartmental osteoarthritis to a lesser extent.  The left knee is relatively preserved with minimal arthritic changes only.  We discussed knee arthritis in general, and I encouraged her to be active.  She does like to use an elliptical machine, and I think that as long as this does not cause significant pain and it is a great idea.  We also talked about how weight can impact knee and all other forms of arthritis and back.  Continue to work on healthy weight as well.  She is in no way is interested in any form of operative intervention.  I appreciate the opportunity to evaluate this very friendly patient. If you have any question regarding her care or prognosis, do not hesitate to ask.   No orders of the defined types were placed in this encounter.  There are no discontinued medications. Orders Placed This Encounter  Procedures  . DG Knee 4 Views W/Patella Right  . DG Knee 4 Views W/Patella Left    Follow-up: No follow-ups on file.  Signed,  Maud Deed. Anastacio Bua, MD   Outpatient Encounter Medications as of 08/23/2020  Medication Sig  . aspirin 81 MG chewable tablet Chew by mouth daily.  Marland Kitchen atorvastatin (LIPITOR) 20 MG tablet TAKE 1 TABLET BY MOUTH  DAILY  . metoprolol succinate (TOPROL-XL) 100 MG 24 hr tablet TAKE 1 TABLET BY MOUTH  DAILY WITH OR IMMEDIATELY  FOLLOWING A MEAL.  Marland Kitchen NIFEdipine (PROCARDIA XL/NIFEDICAL XL) 60 MG 24 hr tablet TAKE 1 TABLET BY MOUTH  DAILY  . pantoprazole (PROTONIX) 40 MG tablet Take 1 tablet (40 mg total) by mouth daily. (Patient taking differently: Take 40 mg by mouth as needed. )  .  triamterene-hydrochlorothiazide (MAXZIDE-25) 37.5-25 MG tablet Take 1 tablet by mouth daily.  . valACYclovir (VALTREX) 500 MG tablet Take 1 tablet (500 mg total) by mouth daily. Take daily for suppressive therapy (Patient taking differently: Take 500 mg by mouth as needed. Take daily for suppressive therapy)   No facility-administered encounter medications on file as of 08/23/2020.

## 2020-09-06 ENCOUNTER — Other Ambulatory Visit: Payer: Self-pay | Admitting: Family Medicine

## 2020-09-06 DIAGNOSIS — Z1231 Encounter for screening mammogram for malignant neoplasm of breast: Secondary | ICD-10-CM

## 2020-09-11 ENCOUNTER — Ambulatory Visit (INDEPENDENT_AMBULATORY_CARE_PROVIDER_SITE_OTHER): Payer: Medicare Other

## 2020-09-11 ENCOUNTER — Other Ambulatory Visit: Payer: Self-pay

## 2020-09-11 DIAGNOSIS — Z23 Encounter for immunization: Secondary | ICD-10-CM

## 2020-09-28 ENCOUNTER — Ambulatory Visit (INDEPENDENT_AMBULATORY_CARE_PROVIDER_SITE_OTHER): Payer: Medicare Other | Admitting: Family Medicine

## 2020-09-28 ENCOUNTER — Other Ambulatory Visit: Payer: Self-pay

## 2020-09-28 ENCOUNTER — Encounter: Payer: Self-pay | Admitting: Family Medicine

## 2020-09-28 VITALS — BP 140/80 | HR 70 | Temp 97.6°F | Ht 62.0 in | Wt 176.0 lb

## 2020-09-28 DIAGNOSIS — R1011 Right upper quadrant pain: Secondary | ICD-10-CM | POA: Diagnosis not present

## 2020-09-28 DIAGNOSIS — R3129 Other microscopic hematuria: Secondary | ICD-10-CM

## 2020-09-28 LAB — POC URINALSYSI DIPSTICK (AUTOMATED)
Bilirubin, UA: NEGATIVE
Glucose, UA: NEGATIVE
Ketones, UA: NEGATIVE
Leukocytes, UA: NEGATIVE
Nitrite, UA: NEGATIVE
Protein, UA: NEGATIVE
Spec Grav, UA: 1.015 (ref 1.010–1.025)
Urobilinogen, UA: 0.2 E.U./dL
pH, UA: 7.5 (ref 5.0–8.0)

## 2020-09-28 LAB — LIPASE: Lipase: 8 U/L — ABNORMAL LOW (ref 11.0–59.0)

## 2020-09-28 LAB — CBC WITH DIFFERENTIAL/PLATELET
Basophils Absolute: 0 10*3/uL (ref 0.0–0.1)
Basophils Relative: 0.3 % (ref 0.0–3.0)
Eosinophils Absolute: 0.1 10*3/uL (ref 0.0–0.7)
Eosinophils Relative: 1.6 % (ref 0.0–5.0)
HCT: 40.6 % (ref 36.0–46.0)
Hemoglobin: 13.8 g/dL (ref 12.0–15.0)
Lymphocytes Relative: 48.2 % — ABNORMAL HIGH (ref 12.0–46.0)
Lymphs Abs: 3.2 10*3/uL (ref 0.7–4.0)
MCHC: 34.1 g/dL (ref 30.0–36.0)
MCV: 95.8 fl (ref 78.0–100.0)
Monocytes Absolute: 0.6 10*3/uL (ref 0.1–1.0)
Monocytes Relative: 8.7 % (ref 3.0–12.0)
Neutro Abs: 2.7 10*3/uL (ref 1.4–7.7)
Neutrophils Relative %: 41.2 % — ABNORMAL LOW (ref 43.0–77.0)
Platelets: 333 10*3/uL (ref 150.0–400.0)
RBC: 4.24 Mil/uL (ref 3.87–5.11)
RDW: 12.8 % (ref 11.5–15.5)
WBC: 6.7 10*3/uL (ref 4.0–10.5)

## 2020-09-28 LAB — COMPREHENSIVE METABOLIC PANEL
ALT: 16 U/L (ref 0–35)
AST: 19 U/L (ref 0–37)
Albumin: 4.3 g/dL (ref 3.5–5.2)
Alkaline Phosphatase: 69 U/L (ref 39–117)
BUN: 14 mg/dL (ref 6–23)
CO2: 29 mEq/L (ref 19–32)
Calcium: 10.2 mg/dL (ref 8.4–10.5)
Chloride: 101 mEq/L (ref 96–112)
Creatinine, Ser: 0.78 mg/dL (ref 0.40–1.20)
GFR: 73.31 mL/min (ref >60.00)
Glucose, Bld: 85 mg/dL (ref 70–99)
Potassium: 3.4 mEq/L — ABNORMAL LOW (ref 3.5–5.1)
Sodium: 137 mEq/L (ref 135–145)
Total Bilirubin: 0.4 mg/dL (ref 0.2–1.2)
Total Protein: 7.5 g/dL (ref 6.0–8.3)

## 2020-09-28 NOTE — Patient Instructions (Addendum)
Can take pantoprazole or nexium 40 mg daily.  Add OTC Pepcid AC twice daily for breakthrough symptoms.  We will call to set up the ultrasound.  Please stop at the lab to have labs drawn.

## 2020-09-28 NOTE — Assessment & Plan Note (Signed)
Persistent.. refer back to uro for further testing that was held off on due to [place].

## 2020-09-28 NOTE — Progress Notes (Signed)
Chief Complaint  Patient presents with  . URQ Pain  . Emesis    after or when eating    History of Present Illness: HPI  79 year old female with HTN presents with new onset RUQ pain and emesis after eating.   She has noted pain intermittently in last few months.  Nagging ache.. lasts 30 min or so. May be worse with greasy foods.  More frequent now in last week.  Worse with eating and after.  Yesterday one episode of emesis. No diarrhea, no fever.   Having some heartburn at night... worse at night.  Having a lot of gas and bloating.  Pantoprazole 40 mg daily.Marland Kitchen despite taking x 1 week straight. Has not taken in last week  Gasx , tums helps minimally.   Still has gallbladder.  Occ upper back pain.  Dr. Fuller Plan GI.  This visit occurred during the SARS-CoV-2 public health emergency.  Safety protocols were in place, including screening questions prior to the visit, additional usage of staff PPE, and extensive cleaning of exam room while observing appropriate contact time as indicated for disinfecting solutions.   COVID 19 screen:  No recent travel or known exposure to COVID19 The patient denies respiratory symptoms of COVID 19 at this time. The importance of social distancing was discussed today.     Review of Systems  Constitutional: Negative for chills and fever.  HENT: Negative for congestion and ear pain.   Eyes: Negative for pain and redness.  Respiratory: Negative for cough and shortness of breath.   Cardiovascular: Negative for chest pain, palpitations and leg swelling.  Gastrointestinal: Positive for abdominal pain, nausea and vomiting. Negative for blood in stool, constipation and diarrhea.  Genitourinary: Negative for dysuria.  Musculoskeletal: Negative for falls and myalgias.  Skin: Negative for rash.  Neurological: Negative for dizziness.  Psychiatric/Behavioral: Negative for depression. The patient is not nervous/anxious.       Past Medical History:   Diagnosis Date  . Allergy   . 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 Hulda Humphrey of vascular surgery in [place]  . Arthritis    rt knee, foot  . Cataract    forming right eye   . 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 Constipation    uses stool softener PRN- uses once a week to once every 2 weeks   . Fibromuscular dysplasia ([place])   . GERD (gastroesophageal reflux disease)   . Hyperlipidemia   . Hypertension    For 16 years; urinary catecholamines within normal limits 12/09; renal Doppler ultrasound showed no evidence for renal artery stenosis  . Normal echocardiogram 4/10   LVEF 65%; no regional wall motion abnormalities; normal RV size and function; pulmonic valve had increased gradient across w/ peak gradient of about 36 mmHg (range of moderate pulmonic stenosis) followup showed normal valve  . Papilloma of breast    right  . Tinnitus of left ear     reports that she quit smoking about 37 years ago. She has never used smokeless tobacco. She reports that she does not drink alcohol and does not use drugs.   Current Outpatient Medications:  .  aspirin 81 MG chewable tablet, Chew by mouth daily., Disp: , Rfl:  .  atorvastatin (LIPITOR) 20 MG tablet, TAKE 1 TABLET BY MOUTH  DAILY, Disp: 90 tablet, Rfl: 3 .  metoprolol succinate (TOPROL-XL) 100 MG 24 hr tablet,  TAKE 1 TABLET BY MOUTH  DAILY WITH OR IMMEDIATELY  FOLLOWING A MEAL., Disp: 90 tablet, Rfl: 3 .  NIFEdipine (PROCARDIA XL/NIFEDICAL XL) 60 MG 24 hr tablet, TAKE 1 TABLET BY MOUTH  DAILY, Disp: 90 tablet, Rfl: 3 .  pantoprazole (PROTONIX) 40 MG tablet, Take 40 mg by mouth daily as needed., Disp: , Rfl:  .  triamterene-hydrochlorothiazide (MAXZIDE-25) 37.5-25 MG tablet, Take 1 tablet by mouth daily., Disp: 90 tablet, Rfl: 2 .  valACYclovir (VALTREX) 500 MG tablet, Take 1 tablet (500 mg total) by mouth daily. Take daily for  suppressive therapy, Disp: 30 tablet, Rfl: 11   Observations/Objective: Blood pressure 140/80, pulse 70, temperature 97.6 F (36.4 C), temperature source Temporal, height 5\' 2"  (1.575 m), weight 176 lb (79.8 kg), SpO2 100 %.  Physical Exam Constitutional:      General: She is not in acute distress.    Appearance: Normal appearance. She is well-developed. She is not ill-appearing or toxic-appearing.  HENT:     Head: Normocephalic.     Right Ear: Hearing, tympanic membrane, ear canal and external ear normal. Tympanic membrane is not erythematous, retracted or bulging.     Left Ear: Hearing, tympanic membrane, ear canal and external ear normal. Tympanic membrane is not erythematous, retracted or bulging.     Nose: No mucosal edema or rhinorrhea.     Right Sinus: No maxillary sinus tenderness or frontal sinus tenderness.     Left Sinus: No maxillary sinus tenderness or frontal sinus tenderness.     Mouth/Throat:     Pharynx: Uvula midline.  Eyes:     General: Lids are normal. Lids are everted, no foreign bodies appreciated.     Conjunctiva/sclera: Conjunctivae normal.     Pupils: Pupils are equal, round, and reactive to light.  Neck:     Thyroid: No thyroid mass or thyromegaly.     Vascular: No carotid bruit.     Trachea: Trachea normal.  Cardiovascular:     Rate and Rhythm: Normal rate and regular rhythm.     Pulses: Normal pulses.     Heart sounds: Normal heart sounds, S1 normal and S2 normal. No murmur heard.  No friction rub. No gallop.   Pulmonary:     Effort: Pulmonary effort is normal. No tachypnea or respiratory distress.     Breath sounds: Normal breath sounds. No decreased breath sounds, wheezing, rhonchi or rales.  Abdominal:     General: Bowel sounds are normal.     Palpations: Abdomen is soft.     Tenderness: There is abdominal tenderness in the right upper quadrant and epigastric area. There is no right CVA tenderness, left CVA tenderness or guarding.   Musculoskeletal:     Cervical back: Normal range of motion and neck supple.  Skin:    General: Skin is warm and dry.     Findings: No rash.  Neurological:     Mental Status: She is alert.  Psychiatric:        Mood and Affect: Mood is not anxious or depressed.        Speech: Speech normal.        Behavior: Behavior normal. Behavior is cooperative.        Thought Content: Thought content normal.        Judgment: Judgment normal.      Assessment and Plan   Microscopic hematuria, idiopathic Persistent.. refer back to uro for further testing that was held off on due to [place].  RUQ pain  Gallbaldder issue vs PUD vs gastritis vs pancreatitis.  Eval with Labs and [place].  Continue PPI and add H2 blocker. If eval negative consider referral to Dr. Fuller Plan for endoscopy.     Eliezer Lofts, MD

## 2020-09-28 NOTE — Assessment & Plan Note (Signed)
Gallbaldder issue vs PUD vs gastritis vs pancreatitis.  Eval with Labs and [place].  Continue PPI and add H2 blocker. If eval negative consider referral to Dr. Fuller Plan for endoscopy.

## 2020-09-29 DIAGNOSIS — I499 Cardiac arrhythmia, unspecified: Secondary | ICD-10-CM | POA: Diagnosis not present

## 2020-09-29 DIAGNOSIS — R002 Palpitations: Secondary | ICD-10-CM | POA: Diagnosis not present

## 2020-09-29 DIAGNOSIS — I10 Essential (primary) hypertension: Secondary | ICD-10-CM | POA: Diagnosis not present

## 2020-09-29 DIAGNOSIS — R000 Tachycardia, unspecified: Secondary | ICD-10-CM | POA: Diagnosis not present

## 2020-09-30 ENCOUNTER — Telehealth: Payer: Self-pay

## 2020-09-30 ENCOUNTER — Other Ambulatory Visit: Payer: Self-pay | Admitting: Family Medicine

## 2020-09-30 ENCOUNTER — Ambulatory Visit
Admission: RE | Admit: 2020-09-30 | Discharge: 2020-09-30 | Disposition: A | Discharge status: Home / Self Care | Payer: Medicare Other | Source: Ambulatory Visit | Attending: Family Medicine | Admitting: Family Medicine

## 2020-09-30 ENCOUNTER — Other Ambulatory Visit: Payer: Self-pay

## 2020-09-30 DIAGNOSIS — R109 Unspecified abdominal pain: Secondary | ICD-10-CM | POA: Diagnosis not present

## 2020-09-30 DIAGNOSIS — R1011 Right upper quadrant pain: Secondary | ICD-10-CM

## 2020-09-30 DIAGNOSIS — K8020 Calculus of gallbladder without cholecystitis without obstruction: Secondary | ICD-10-CM | POA: Diagnosis not present

## 2020-09-30 NOTE — Telephone Encounter (Signed)
Can we make this more urgent as pt anxious and pain occurring more frequently? I can change to stat if needed.

## 2020-09-30 NOTE — Addendum Note (Signed)
Addended by: Eliezer Lofts E on: 09/30/2020 12:06 PM   Modules accepted: Orders

## 2020-09-30 NOTE — Telephone Encounter (Signed)
Pt said was seen on 09/28/20 and [place] of abd ordered; pt said on 09/29/20 pt pulse was 160; pt called EMS and they did eval and EKG and stayed with pt until heart rate was below 90. Initially when EMS got there BP 196/80; rechecked at 170/90 and later BP 150/80.  Pt said she was having rt side pain and felt flushed on the rt side. Pt has not taken BP today but P is 74. Pt said she does not need to be seen; pt feels basically OK but still has slight RUQ pain on and off. Pt wants to know when her [place] of abd can be scheduled with preference in [place] but pt said would go to [place] if sooner appt. Pt request cb. Sending note to [facility] and Dr Diona Browner.

## 2020-09-30 NOTE — Telephone Encounter (Signed)
Pt is scheduled for today at 3pm at [facility]

## 2020-10-01 ENCOUNTER — Telehealth: Payer: Self-pay

## 2020-10-01 NOTE — Telephone Encounter (Signed)
I typically refer to [facility] [place] [facility] in [place], but I am not familiar with that surgeon. I do not have any specific recommendaitons for this particular surgery.. just a general surgeon as it is a standard low risk surgery.

## 2020-10-01 NOTE — Telephone Encounter (Signed)
Spoke to pt. She will call [facility].

## 2020-10-01 NOTE — Telephone Encounter (Signed)
Pt left v/m that DR Diona Browner wanted to know if pt had a surgeon in mind to see about GB. Pt said she was thinking of Dr Reather Laurence at [facility]. Pt wants to know if Dr Diona Browner has better recommendations for surgeons that might specialize in removing GB and if not what does Dr Diona Browner know about Dr Reather Laurence. Pt request cb.

## 2020-10-06 ENCOUNTER — Other Ambulatory Visit: Payer: Self-pay | Admitting: Family Medicine

## 2020-10-11 ENCOUNTER — Ambulatory Visit
Admission: RE | Admit: 2020-10-11 | Discharge: 2020-10-11 | Disposition: A | Discharge status: Home / Self Care | Payer: Medicare Other | Source: Ambulatory Visit | Attending: Family Medicine | Admitting: Family Medicine

## 2020-10-11 ENCOUNTER — Other Ambulatory Visit: Payer: Self-pay

## 2020-10-11 DIAGNOSIS — Z1231 Encounter for screening mammogram for malignant neoplasm of breast: Secondary | ICD-10-CM

## 2020-10-18 ENCOUNTER — Other Ambulatory Visit: Payer: Self-pay | Admitting: Family Medicine

## 2020-10-18 DIAGNOSIS — R928 Other abnormal and inconclusive findings on diagnostic imaging of breast: Secondary | ICD-10-CM

## 2020-10-29 ENCOUNTER — Other Ambulatory Visit: Payer: Self-pay

## 2020-10-29 ENCOUNTER — Other Ambulatory Visit: Payer: Self-pay | Admitting: Family Medicine

## 2020-10-29 ENCOUNTER — Ambulatory Visit
Admission: RE | Admit: 2020-10-29 | Discharge: 2020-10-29 | Disposition: A | Discharge status: Home / Self Care | Payer: Medicare Other | Source: Ambulatory Visit | Attending: Family Medicine | Admitting: Family Medicine

## 2020-10-29 DIAGNOSIS — R921 Mammographic calcification found on diagnostic imaging of breast: Secondary | ICD-10-CM | POA: Diagnosis not present

## 2020-10-29 DIAGNOSIS — R928 Other abnormal and inconclusive findings on diagnostic imaging of breast: Secondary | ICD-10-CM

## 2020-11-01 ENCOUNTER — Ambulatory Visit: Payer: Self-pay | Admitting: Surgery

## 2020-11-01 DIAGNOSIS — K8020 Calculus of gallbladder without cholecystitis without obstruction: Secondary | ICD-10-CM | POA: Diagnosis not present

## 2020-11-01 NOTE — H&P (Signed)
Kristy [name] Appointment: 11/01/2020 10:10 AM Location: [facility] Patient #: [id number] DOB: 1941-10-01 Widowed / Language: [name] / Race: Black or African American Female  History of Present Illness [name] A. Greer Wainright MD; 11/01/2020 10:20 AM) Patient words: Patient sent at the request of Dr. Diona Browner for intermittent history of right upper quadrant abdominal pain. She's had intermittent right upper quadrant pain of her abdomen for last 2 years. The pain has become more frequent lasting minutes to hours and is sharp in nature. This describes a sharp shooting pain up under her right rib cage into her right back. Its associated at times with nausea and vomiting. Fatty foods make it worse. No change in her appetite. The pain gets other when she avoids fatty foods. Ultrasound performed which shows gallstones without common bile duct dilation      CLINICAL DATA: Right upper quadrant abdominal pain.  EXAM: ULTRASOUND ABDOMEN LIMITED RIGHT UPPER QUADRANT  COMPARISON: None.  FINDINGS: Gallbladder:  Cholelithiasis is noted with the largest measuring 15 mm. No significant gallbladder wall thickening, pericholecystic fluid or positive sonographic Murphy's sign is noted.  Common bile duct:  Diameter: 6 mm which is within normal limits.  Liver:  No focal lesion identified. Within normal limits in parenchymal echogenicity. Portal vein is patent on color Doppler imaging with normal direction of blood flow towards the liver.  Other: None.  IMPRESSION: Cholelithiasis without evidence of cholecystitis. No other abnormality seen in the right upper quadrant of the abdomen.   Electronically Signed By: Marijo Conception M.D. On: 09/30/2020 14:52.  The patient is a 79 year old female.   Allergies Darden Palmer, [place]; 11/01/2020 9:57 AM) No Known Drug Allergies [07/09/2017]: Allergies Reconciled  Medication History Darden Palmer, [place]; 11/01/2020 9:58  AM) Triamterene-HCTZ (37.5-25MG  Tablet, Oral) Active. Vitamin D (2000UNIT Tablet, Oral) Active. AmLODIPine Besylate (5MG  Tablet, Oral) Active. Metoprolol Succinate ER (100MG  Tablet ER 24HR, Oral) Active. Pantoprazole Sodium (40MG  Tablet DR, Oral) Active. Aspirin (81MG  Tablet, Oral) Active. Medications Reconciled    Vitals Lattie Haw Caldwell RMA; 11/01/2020 9:58 AM) 11/01/2020 9:58 AM Weight: 176.13 lb Height: 62in Body Surface Area: 1.81 m Body Mass Index: 32.21 kg/m  Temp.: 981F  Pulse: 113 (Regular)  P.OX: 98% (Room air) BP: 118/64(Sitting, Left Arm, Standard)        Physical Exam (Zannah Melucci A. Parry Po MD; 11/01/2020 10:20 AM)  General Mental Status-Alert. General Appearance-Consistent with stated age. Hydration-Well hydrated. Voice-Normal.  Head and Neck Head-normocephalic, atraumatic with no lesions or palpable masses.  Eye Eyeball - Bilateral-Extraocular movements intact. Sclera/Conjunctiva - Bilateral-No scleral icterus.  Chest and Lung Exam Chest and lung exam reveals -quiet, even and easy respiratory effort with no use of accessory muscles and on auscultation, normal breath sounds, no adventitious sounds and normal vocal resonance. Inspection Chest Wall - Normal. Back - normal.  Cardiovascular Cardiovascular examination reveals -on palpation PMI is normal in location and amplitude, no palpable S3 or S4. Normal cardiac borders., normal heart sounds, regular rate and rhythm with no murmurs, carotid auscultation reveals no bruits and normal pedal pulses bilaterally.  Abdomen Inspection Inspection of the abdomen reveals - No Hernias. Skin - Scar - no surgical scars. Palpation/Percussion Palpation and Percussion of the abdomen reveal - Soft, Non Tender, No Rebound tenderness, No Rigidity (guarding) and No hepatosplenomegaly. Auscultation Auscultation of the abdomen reveals - Bowel sounds normal.  Neurologic Neurologic  evaluation reveals -alert and oriented x 3 with no impairment of recent or remote memory. Mental Status-Normal.  Musculoskeletal Normal Exam - Left-Upper  Extremity Strength Normal and Lower Extremity Strength Normal. Normal Exam - Right-Upper Extremity Strength Normal, Lower Extremity Weakness.    Assessment & Plan (Anina Schnake A. 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 Reviewed medical and surgical options. She has opted for laparoscopic cholecystectomy with possible cholangiogram. The procedure has been discussed with the patient. Risks of laparoscopic cholecystectomy include bleeding, infection, bile duct injury, leak, death, open surgery, diarrhea, other surgery, organ injury, blood vessel injury, DVT, and additional care.  total time 45 minutes  Current Plans You are being scheduled for surgery- Our schedulers will call you.  You should hear from our office's scheduling department within 5 working days about the location, date, and time of surgery. We try to make accommodations for patient's preferences in scheduling surgery, but sometimes the OR schedule or the surgeon's schedule prevents [place] from making those accommodations.  If you have not heard from our office [phone]) in 5 working days, call the office and ask for your surgeon's nurse.  If you have other questions about your diagnosis, plan, or surgery, call the office and ask for your surgeon's nurse.  Pt Education - Pamphlet Given - Laparoscopic Gallbladder Surgery: discussed with patient and provided information. The anatomy & physiology of hepatobiliary & pancreatic function was discussed. The pathophysiology of gallbladder dysfunction was discussed. Natural history risks without surgery was discussed. I feel the risks of no intervention will lead to serious problems that outweigh  the operative risks; therefore, I recommended cholecystectomy to remove the pathology. I explained laparoscopic techniques with possible need for an open approach. Probable cholangiogram to evaluate the bilary tract was explained as well.  Risks such as bleeding, infection, abscess, leak, injury to other organs, need for further treatment, heart attack, death, and other risks were discussed. I noted a good likelihood this will help address the problem. Possibility that this will not correct all abdominal symptoms was explained. Goals of post-operative recovery were discussed as well. We will work to minimize complications. An educational handout further explaining the pathology and treatment options was given as well. Questions were answered. The patient expresses understanding & wishes to proceed with surgery.  Pt Education - Laparoscopic Cholecystectomy: gallbladder

## 2020-11-09 ENCOUNTER — Other Ambulatory Visit: Payer: Self-pay

## 2020-11-09 ENCOUNTER — Ambulatory Visit
Admission: RE | Admit: 2020-11-09 | Discharge: 2020-11-09 | Disposition: A | Discharge status: Home / Self Care | Payer: Medicare Other | Source: Ambulatory Visit | Attending: Family Medicine | Admitting: Family Medicine

## 2020-11-09 DIAGNOSIS — R921 Mammographic calcification found on diagnostic imaging of breast: Secondary | ICD-10-CM

## 2020-11-09 DIAGNOSIS — D0511 Intraductal carcinoma in situ of right breast: Secondary | ICD-10-CM | POA: Diagnosis not present

## 2020-11-09 HISTORY — PX: BREAST BIOPSY: SHX20

## 2020-11-10 ENCOUNTER — Other Ambulatory Visit: Payer: Self-pay | Admitting: Family Medicine

## 2020-11-10 DIAGNOSIS — R921 Mammographic calcification found on diagnostic imaging of breast: Secondary | ICD-10-CM

## 2020-11-11 ENCOUNTER — Other Ambulatory Visit: Payer: Self-pay | Admitting: Family Medicine

## 2020-11-11 ENCOUNTER — Telehealth: Payer: Self-pay

## 2020-11-11 DIAGNOSIS — C50911 Malignant neoplasm of unspecified site of right female breast: Secondary | ICD-10-CM

## 2020-11-11 NOTE — Telephone Encounter (Signed)
Pt called to verify she does not have any appts soon with Dr Diona Browner; I checked appt notes and no upcoming appts with Dr Diona Browner. Pt said she was dx on 11/08/20 with breast CA; pt said she has a lot of testing coming up due to the new dx of breast CA and did not want to miss an appt with Dr Diona Browner. Pt wanted to make sure Dr Diona Browner is aware of her new dx. Sending note to Dr Diona Browner.

## 2020-11-12 NOTE — Telephone Encounter (Signed)
Called patient about new diagnosis. Answered questions. Patient voice appreciation for the call.

## 2020-11-15 ENCOUNTER — Ambulatory Visit
Admission: RE | Admit: 2020-11-15 | Discharge: 2020-11-15 | Disposition: A | Discharge status: Home / Self Care | Payer: Medicare Other | Source: Ambulatory Visit | Attending: Family Medicine | Admitting: Family Medicine

## 2020-11-15 DIAGNOSIS — C50911 Malignant neoplasm of unspecified site of right female breast: Secondary | ICD-10-CM | POA: Diagnosis not present

## 2020-11-15 MED ORDER — GADOBUTROL 1 MMOL/ML IV SOLN
8.0000 mL | Freq: Once | INTRAVENOUS | Status: AC | PRN
  Administered 2020-11-15: 8 mL via INTRAVENOUS

## 2020-11-17 ENCOUNTER — Telehealth: Payer: Self-pay | Admitting: Family Medicine

## 2020-11-17 DIAGNOSIS — N649 Disorder of breast, unspecified: Secondary | ICD-10-CM

## 2020-11-17 NOTE — Telephone Encounter (Signed)
-----   Message from Osborne Oman sent at 11/17/2020  3:02 PM EST ----- Regarding: FW: Breast MRI Recommendations Dr. Ermalene Searing,  The Epic exams to order are: These will be done at [facility] location. [id number]  MR RT BREAST BX 1ST LESION [id number]  MR RT BREAST BX ADD LESION  Thanks so much for your help! Olegario Messier        ----- Message ----- From: Osborne Oman Sent: 11/16/2020   8:09 AM EST To: Excell Seltzer, MD, Hannah Beat, MD Subject: Breast MRI Recommendations                     Dr. Patsy Lager,  The above patient had a breast MRI on 11/15/20 and below are the recommendations of Dr. Frederico Hamman.  RECOMMENDATION: 1. MRI guided biopsy is recommended for the 6 mm mass in the slightly lower outer right breast  2. MRI guided biopsy is recommended for the 1.8 cm linear non mass enhancement in the superior central right breast.   Due to the absence of Dr. Ermalene Searing, please review the recommendations with the patient and enter any orders necessary.  The recommended biopsies are in addition to the stereotactic biopsy currently scheduled for November 23, 2020.  The patient also has an appointment scheduled with Dr. Luisa Hart at [facility] on November 26, 2020.  Thank you.  Osborne Oman [facility] [phone]

## 2020-11-17 NOTE — Telephone Encounter (Signed)
Orders placed. I will called pt to make her aware, but no answer. LMOM. I will try again later.    FYI if she calls back, info re: MRI and needed biopsy from Osborne Oman can be shared.    I spoke with her on 11/11/2020 from home regarding her 11/08/2020 breast cancer diagnosis.

## 2020-11-17 NOTE — Telephone Encounter (Signed)
Please call [facility] back... I have not ordered a MRI guided breast biopsy before... do they have an order number?

## 2020-11-17 NOTE — Telephone Encounter (Signed)
-----   Message from Osborne Oman sent at 11/15/2020  3:05 PM EST ----- Regarding: Breast MRI Recommendations Dr. Patsy Lager,  The above patient had a breast MRI on 11/15/20 and below are the recommendations of Dr. Frederico Hamman.  RECOMMENDATION: 1. MRI guided biopsy is recommended for the 6 mm mass in the slightly lower outer right breast  2. MRI guided biopsy is recommended for the 1.8 cm linear non mass enhancement in the superior central right breast.   Due to the absence of Dr. Ermalene Searing, please review the recommendations with the patient and enter any orders necessary.  The recommended biopsies are in addition to the stereotactic biopsy currently scheduled for November 23, 2020.  The patient also has an appointment scheduled with Dr. Luisa Hart at [facility] on November 26, 2020.  Thank you.  Osborne Oman [facility] [phone]

## 2020-11-18 ENCOUNTER — Other Ambulatory Visit: Payer: Self-pay | Admitting: Family Medicine

## 2020-11-18 DIAGNOSIS — N649 Disorder of breast, unspecified: Secondary | ICD-10-CM

## 2020-11-18 DIAGNOSIS — R9389 Abnormal findings on diagnostic imaging of other specified body structures: Secondary | ICD-10-CM

## 2020-11-18 NOTE — Telephone Encounter (Signed)
Noted  

## 2020-11-23 ENCOUNTER — Other Ambulatory Visit: Payer: Self-pay

## 2020-11-23 ENCOUNTER — Ambulatory Visit
Admission: RE | Admit: 2020-11-23 | Discharge: 2020-11-23 | Disposition: A | Discharge status: Home / Self Care | Payer: Medicare Other | Source: Ambulatory Visit | Attending: Family Medicine | Admitting: Family Medicine

## 2020-11-23 DIAGNOSIS — D0511 Intraductal carcinoma in situ of right breast: Secondary | ICD-10-CM | POA: Diagnosis not present

## 2020-11-23 DIAGNOSIS — R921 Mammographic calcification found on diagnostic imaging of breast: Secondary | ICD-10-CM

## 2020-11-23 HISTORY — PX: BREAST BIOPSY: SHX20

## 2020-11-26 ENCOUNTER — Ambulatory Visit: Payer: Self-pay | Admitting: Surgery

## 2020-11-26 DIAGNOSIS — D0511 Intraductal carcinoma in situ of right breast: Secondary | ICD-10-CM | POA: Diagnosis not present

## 2020-11-26 DIAGNOSIS — K8020 Calculus of gallbladder without cholecystitis without obstruction: Secondary | ICD-10-CM | POA: Diagnosis not present

## 2020-11-26 NOTE — H&P (Signed)
Kristy [name] Appointment: 11/26/2020 10:10 AM Location: [facility] Patient #: [id number] DOB: 08-Jun-1941 Widowed / Language: Kristy [name] / Race: Black or African American Female  History of Present Illness Kristy Moores A. Zane Pellecchia MD; 11/26/2020 12:45 PM) Patient words: Patient returns for follow-up. She was seen last month due to gallstones and was initially scheduled to have cholecystectomy later this month. She underwent a mammogram which showed a cluster of microcalcifications right lower quadrant. General magnetic resonance imaging as well. She had core biopsy and calcifications which showed DCIS high grade comedo pattern. There are some other areas detected by the magnetic resonance imaging of her breasts which are to be biopsied Monday. She's having no abdominal pain or no real problem at this point with her gallbladder.                ADDITIONAL INFORMATION: PROGNOSTIC INDICATORS Results: IMMUNOHISTOCHEMICAL AND MORPHOMETRIC ANALYSIS PERFORMED MANUALLY Estrogen Receptor: 100%, POSITIVE, STRONG STAINING INTENSITY Progesterone Receptor: 0%, NEGATIVE COMMENT: The negative hormone receptor study(ies) in this case has an internal positive control. REFERENCE RANGE ESTROGEN RECEPTOR NEGATIVE 0% POSITIVE =>1% REFERENCE RANGE PROGESTERONE RECEPTOR NEGATIVE 0% POSITIVE =>1% All controls stained appropriately Kristy [name], Electronic Signature ( Signed 11/25/2020) FINAL DIAGNOSIS Diagnosis Breast, right, needle core biopsy, right breast - DUCTAL CARCINOMA IN SITU WITH CALCIFICATIONS AND NECROSIS - SEE COMMENT 1 of 2 FINAL for Kristy [name] ([id number]) Microscopic Comment Based on the biopsy, the ductal carcinoma in situ has a comedo pattern, high nuclear grade and measures 0.3 cm in greatest linear extent. Prognostic markers (ER/PR) are pending and will be reported in an addendum. These results were called to [facility]  on November 24, 2020. Kristy [name], Electronic Signature (Case signed 11/24/2020).  The patient is a 80 year old female.   Allergies (Kristy [name], CMA; 11/26/2020 9:51 AM) No Known Drug Allergies [07/09/2017]:  Medication History (Kristy [name], CMA; 11/26/2020 9:51 AM) Triamterene-HCTZ (37.5-25MG  Tablet, Oral) Active. Vitamin D (2000UNIT Tablet, Oral) Active. AmLODIPine Besylate (5MG  Tablet, Oral) Active. Metoprolol Succinate ER (100MG  Tablet ER 24HR, Oral) Active. Pantoprazole Sodium (40MG  Tablet DR, Oral) Active. Aspirin (81MG  Tablet, Oral) Active. Medications Reconciled    Vitals (Kristy [name] CMA; 11/26/2020 9:51 AM) 11/26/2020 9:51 AM Weight: 176.5 lb Height: 62in Body Surface Area: 1.81 m Body Mass Index: 32.28 kg/m  Temp.: 97.7F  Pulse: 84 (Regular)  P.OX: 97% (Room air) BP: 158/88(Sitting, Left Arm, Standard)        Physical Exam (Brandilyn Nanninga A. Dare Sanger MD; 11/26/2020 12:45 PM)  General Mental Status-Alert. General Appearance-Consistent with stated age. Hydration-Well hydrated. Voice-Normal.  Abdomen Inspection Inspection of the abdomen reveals - No Hernias. Skin - Scar - no surgical scars. Palpation/Percussion Palpation and Percussion of the abdomen reveal - Soft, Non Tender, No Rebound tenderness, No Rigidity (guarding) and No hepatosplenomegaly. Auscultation Auscultation of the abdomen reveals - Bowel sounds normal.  Lymphatic Head & Neck  General Head & Neck Lymphatics: Bilateral - Description - Normal. Axillary  General Axillary Region: Bilateral - Description - Normal. Tenderness - Non Tender.    Assessment & Plan (Danaly Bari A. Bhargav Barbaro MD; 11/26/2020 12:46 PM)  BREAST NEOPLASM, TIS (DCIS), RIGHT (D05.11) Impression: Await magnetic resonance imaging biopsies. Plan will be for breast conserving surgery as long as physically able to do so. We discussed right breast she localized  lumpectomy. She would rather undergo breast conserving surgery possible so I will await final pathology of these new biopsies and then make her final decision.  Risk of lumpectomy include bleeding, infection, seroma, more surgery, use of seed/wire, wound care, cosmetic deformity and the need for other treatments, death , blood clots, death. Pt agrees to proceed.  Current Plans We discussed the staging and pathophysiology of breast cancer. We discussed all of the different options for treatment for breast cancer including surgery, chemotherapy, radiation therapy, Herceptin, and antiestrogen therapy. We discussed a sentinel lymph node biopsy as she does not appear to having lymph node involvement right now. We discussed the performance of that with injection of radioactive tracer and blue dye. We discussed that she would have an incision underneath her axillary hairline. We discussed that there is a bout a 10-20% chance of having a positive node with a sentinel lymph node biopsy and we will await the permanent pathology to make any other first further decisions in terms of her treatment. One of these options might be to return to the operating room to perform an axillary lymph node dissection. We discussed about a 1-2% risk lifetime of chronic shoulder pain as well as lymphedema associated with a sentinel lymph node biopsy. We discussed the options for treatment of the breast cancer which included lumpectomy versus a mastectomy. We discussed the performance of the lumpectomy with a wire placement. We discussed a 10-20% chance of a positive margin requiring reexcision in the operating room. We also discussed that she may need radiation therapy or antiestrogen therapy or both if she undergoes lumpectomy. We discussed the mastectomy and the postoperative care for that as well. We discussed that there is no difference in her survival whether she undergoes lumpectomy with radiation therapy or antiestrogen therapy  versus a mastectomy. There is a slight difference in the local recurrence rate being 3-5% with lumpectomy and about 1% with a mastectomy. We discussed the risks of operation including bleeding, infection, possible reoperation. She understands her further therapy will be based on what her stages at the time of her operation.  Pt Education - flb breast cancer surgery: discussed with patient and provided information. Pt Education - [facility] Breast Biopsy HCI: discussed with patient and provided information.  SYMPTOMATIC CHOLELITHIASIS (K80.20) Impression: Postponed surgery for now and do the breast tissue first as long as she remains asymptomatic total time 45 minutes

## 2020-11-26 NOTE — H&P (View-Only) (Signed)
Kristy [name] Appointment: 11/26/2020 10:10 AM Location: [facility] Patient #: [id number] DOB: 05/22/1941 Widowed / Language: English / Race: Black or African American Female  History of Present Illness (Kristy Vittitow A. Rakeya Glab MD; 11/26/2020 12:45 PM) Patient words: Patient returns for follow-up. She was seen last month due to gallstones and was initially scheduled to have cholecystectomy later this month. She underwent a mammogram which showed a cluster of microcalcifications right lower quadrant. General magnetic resonance imaging as well. She had core biopsy and calcifications which showed DCIS high grade comedo pattern. There are some other areas detected by the magnetic resonance imaging of her breasts which are to be biopsied Monday. She's having no abdominal pain or no real problem at this point with her gallbladder.                ADDITIONAL INFORMATION: PROGNOSTIC INDICATORS Results: IMMUNOHISTOCHEMICAL AND MORPHOMETRIC ANALYSIS PERFORMED MANUALLY Estrogen Receptor: 100%, POSITIVE, STRONG STAINING INTENSITY Progesterone Receptor: 0%, NEGATIVE COMMENT: The negative hormone receptor study(ies) in this case has an internal positive control. REFERENCE RANGE ESTROGEN RECEPTOR NEGATIVE 0% POSITIVE =>1% REFERENCE RANGE PROGESTERONE RECEPTOR NEGATIVE 0% POSITIVE =>1% All controls stained appropriately Kristy BUTLER MD Pathologist, Electronic Signature ( Signed 11/25/2020) FINAL DIAGNOSIS Diagnosis Breast, right, needle core biopsy, right breast - DUCTAL CARCINOMA IN SITU WITH CALCIFICATIONS AND NECROSIS - SEE COMMENT 1 of 2 FINAL for Kristy [name], Kristy [name] ([id number]) Microscopic Comment Based on the biopsy, the ductal carcinoma in situ has a comedo pattern, high nuclear grade and measures 0.3 cm in greatest linear extent. Prognostic markers (ER/PR) are pending and will be reported in an addendum. These results were called to [facility]  on November 24, 2020. Kristy BUTLER MD Pathologist, Electronic Signature (Case signed 11/24/2020).  The patient is a 79 year old female.   Allergies (Kheana Marshall-McBride, CMA; 11/26/2020 9:51 AM) No Known Drug Allergies [07/09/2017]:  Medication History (Kheana Marshall-McBride, CMA; 11/26/2020 9:51 AM) Triamterene-HCTZ (37.5-25MG Tablet, Oral) Active. Vitamin D (2000UNIT Tablet, Oral) Active. AmLODIPine Besylate (5MG Tablet, Oral) Active. Metoprolol Succinate ER (100MG Tablet ER 24HR, Oral) Active. Pantoprazole Sodium (40MG Tablet DR, Oral) Active. Aspirin (81MG Tablet, Oral) Active. Medications Reconciled    Vitals (Kheana Marshall-McBride CMA; 11/26/2020 9:51 AM) 11/26/2020 9:51 AM Weight: 176.5 lb Height: 62in Body Surface Area: 1.81 m Body Mass Index: 32.28 kg/m  Temp.: 97.7F  Pulse: 84 (Regular)  P.OX: 97% (Room air) BP: 158/88(Sitting, Left Arm, Standard)        Physical Exam (Kenyona Rena A. Kharma Sampsel MD; 11/26/2020 12:45 PM)  General Mental Status-Alert. General Appearance-Consistent with stated age. Hydration-Well hydrated. Voice-Normal.  Abdomen Inspection Inspection of the abdomen reveals - No Hernias. Skin - Scar - no surgical scars. Palpation/Percussion Palpation and Percussion of the abdomen reveal - Soft, Non Tender, No Rebound tenderness, No Rigidity (guarding) and No hepatosplenomegaly. Auscultation Auscultation of the abdomen reveals - Bowel sounds normal.  Lymphatic Head & Neck  General Head & Neck Lymphatics: Bilateral - Description - Normal. Axillary  General Axillary Region: Bilateral - Description - Normal. Tenderness - Non Tender.    Assessment & Plan (Syble Picco A. Anique Beckley MD; 11/26/2020 12:46 PM)  BREAST NEOPLASM, TIS (DCIS), RIGHT (D05.11) Impression: Await magnetic resonance imaging biopsies. Plan will be for breast conserving surgery as long as physically able to do so. We discussed right breast she localized  lumpectomy. She would rather undergo breast conserving surgery possible so I will await final pathology of these new biopsies and then make her final decision.   Risk of lumpectomy include bleeding, infection, seroma, more surgery, use of seed/wire, wound care, cosmetic deformity and the need for other treatments, death , blood clots, death. Pt agrees to proceed.  Current Plans We discussed the staging and pathophysiology of breast cancer. We discussed all of the different options for treatment for breast cancer including surgery, chemotherapy, radiation therapy, Herceptin, and antiestrogen therapy. We discussed a sentinel lymph node biopsy as she does not appear to having lymph node involvement right now. We discussed the performance of that with injection of radioactive tracer and blue dye. We discussed that she would have an incision underneath her axillary hairline. We discussed that there is a bout a 10-20% chance of having a positive node with a sentinel lymph node biopsy and we will await the permanent pathology to make any other first further decisions in terms of her treatment. One of these options might be to return to the operating room to perform an axillary lymph node dissection. We discussed about a 1-2% risk lifetime of chronic shoulder pain as well as lymphedema associated with a sentinel lymph node biopsy. We discussed the options for treatment of the breast cancer which included lumpectomy versus a mastectomy. We discussed the performance of the lumpectomy with a wire placement. We discussed a 10-20% chance of a positive margin requiring reexcision in the operating room. We also discussed that she may need radiation therapy or antiestrogen therapy or both if she undergoes lumpectomy. We discussed the mastectomy and the postoperative care for that as well. We discussed that there is no difference in her survival whether she undergoes lumpectomy with radiation therapy or antiestrogen therapy  versus a mastectomy. There is a slight difference in the local recurrence rate being 3-5% with lumpectomy and about 1% with a mastectomy. We discussed the risks of operation including bleeding, infection, possible reoperation. She understands her further therapy will be based on what her stages at the time of her operation.  Pt Education - flb breast cancer surgery: discussed with patient and provided information. Pt Education - [facility] Breast Biopsy HCI: discussed with patient and provided information.  SYMPTOMATIC CHOLELITHIASIS (K80.20) Impression: Postponed surgery for now and do the breast tissue first as long as she remains asymptomatic total time 45 minutes

## 2020-11-29 ENCOUNTER — Telehealth: Payer: Self-pay | Admitting: Family Medicine

## 2020-11-29 ENCOUNTER — Ambulatory Visit
Admission: RE | Admit: 2020-11-29 | Discharge: 2020-11-29 | Disposition: A | Discharge status: Home / Self Care | Payer: Medicare Other | Source: Ambulatory Visit | Attending: Family Medicine | Admitting: Family Medicine

## 2020-11-29 ENCOUNTER — Other Ambulatory Visit: Payer: Self-pay

## 2020-11-29 DIAGNOSIS — N649 Disorder of breast, unspecified: Secondary | ICD-10-CM

## 2020-11-29 DIAGNOSIS — N6311 Unspecified lump in the right breast, upper outer quadrant: Secondary | ICD-10-CM | POA: Diagnosis not present

## 2020-11-29 DIAGNOSIS — C50919 Malignant neoplasm of unspecified site of unspecified female breast: Secondary | ICD-10-CM

## 2020-11-29 DIAGNOSIS — D0511 Intraductal carcinoma in situ of right breast: Secondary | ICD-10-CM | POA: Diagnosis not present

## 2020-11-29 DIAGNOSIS — R928 Other abnormal and inconclusive findings on diagnostic imaging of breast: Secondary | ICD-10-CM | POA: Diagnosis not present

## 2020-11-29 DIAGNOSIS — R9389 Abnormal findings on diagnostic imaging of other specified body structures: Secondary | ICD-10-CM

## 2020-11-29 HISTORY — PX: BREAST BIOPSY: SHX20

## 2020-11-29 HISTORY — DX: Malignant neoplasm of unspecified site of unspecified female breast: C50.919

## 2020-11-29 MED ORDER — GADOBUTROL 1 MMOL/ML IV SOLN
7.0000 mL | Freq: Once | INTRAVENOUS | Status: AC | PRN
  Administered 2020-11-29: 7 mL via INTRAVENOUS

## 2020-11-29 NOTE — Telephone Encounter (Signed)
Patient called in stating she would like to speak with Butch Penny in regards to upcoming surgery. PT has a few questions in regards to cpe/ labs/ etc. Please advise.

## 2020-11-29 NOTE — Telephone Encounter (Signed)
Spoke with Kristy [name].  She is scheduled for surgery on 12/14/2020.  She is concerned that on her last blood check in November, her potassium level was low and wanted to make sure her potassium level would be rechecked prior to surgery.  I advised patient that Dr. Brantley Stage has ordered and CBC w/Diff and CMET for pre op labs so her potassium will be checked prior to surgery.

## 2020-11-30 ENCOUNTER — Telehealth: Payer: Self-pay | Admitting: Oncology

## 2020-11-30 ENCOUNTER — Ambulatory Visit: Payer: Self-pay | Admitting: Surgery

## 2020-11-30 ENCOUNTER — Other Ambulatory Visit: Payer: Self-pay | Admitting: Surgery

## 2020-11-30 DIAGNOSIS — D0511 Intraductal carcinoma in situ of right breast: Secondary | ICD-10-CM

## 2020-11-30 NOTE — Telephone Encounter (Signed)
Received a new pt referral from Dr. Brantley Stage for DCIS. Kristy [name] returned my call and has been scheduled to see Dr. Jana Hakim on 2/7 at 4pm w/labs at 330pm.

## 2020-12-01 ENCOUNTER — Other Ambulatory Visit: Payer: Self-pay | Admitting: Family Medicine

## 2020-12-08 NOTE — Progress Notes (Signed)
Location of Breast Cancer: Right Breast Cancer  Did patient present with symptoms (if so, please note symptoms) or was this found on screening mammography?: Routine mammogram  MRI Breast 11/15/2020: There is enhancement around a small hematoma and a biopsy tract in the lower outer quadrant of the right breast consistent with the site of the patient's DCIS and CSL.  There is an indeterminate 6 mm enhancing mass with suspicious kinetics just superior and posterior to the biopsy site in the lower outer right breast.  There is an indeterminate 1.8 cm area of linear non mass enhancement in the superior central right breast.  No evidence of malignancy in the left breast.  Diagnostic Mammogram 10/29/2020: Indeterminate 1.1 cm group of fine pleomorphic calcifications in a linear orientation in the lower outer quadrant of the right breast at middle depth.  Screening Mammogram 10/11/2020: Cluster of micro-calcifications right lower quadrant.  Histology per Pathology Report: Right Breast 11/29/2020    Histology per Pathology Report: Right Breast 11/09/2020  Receptor Status: ER(+100%), PR (-0%), Her2-neu (), Ki-()    Past/Anticipated interventions by surgeon, if any: Dr. Brantley Stage 11/26/2020 -Await magnetic resonance imaging biopsies. -Plan will be for breast conserving surgery as long as physically able to do so.  -We discussed right breast she localized lumpectomy. She would rather undergo breast conserving surgery possible so I will await final pathology of these new biopsies and then make her final decision. -Right Breast Lumpectomy x3 with radioactive seed localization. 12/16/2020   Past/Anticipated interventions by medical oncology, if any: Chemotherapy  Dr. Jana Hakim 12/27/2020   Lymphedema issues, if any:  No  Pain issues, if any:  Has occasional pain in the area where the biopsy was done.  SAFETY ISSUES:  Prior radiation? No  Pacemaker/ICD? No  Possible current pregnancy?  Hysterectomy  Is the patient on methotrexate? No  Current Complaints / other details:   -Previous right lumpectomy 08/14/2017    Cori Razor, RN 12/08/2020,8:42 AM

## 2020-12-09 ENCOUNTER — Other Ambulatory Visit: Payer: Self-pay

## 2020-12-09 ENCOUNTER — Ambulatory Visit
Admission: RE | Admit: 2020-12-09 | Discharge: 2020-12-09 | Disposition: A | Discharge status: Home / Self Care | Payer: Medicare Other | Source: Ambulatory Visit | Attending: Radiation Oncology | Admitting: Radiation Oncology

## 2020-12-09 ENCOUNTER — Encounter: Payer: Self-pay | Admitting: Radiation Oncology

## 2020-12-09 VITALS — BP 157/88 | HR 83 | Temp 97.7°F | Resp 18 | Ht 62.0 in | Wt 177.0 lb

## 2020-12-09 DIAGNOSIS — N189 Chronic kidney disease, unspecified: Secondary | ICD-10-CM | POA: Diagnosis not present

## 2020-12-09 DIAGNOSIS — Z8042 Family history of malignant neoplasm of prostate: Secondary | ICD-10-CM | POA: Insufficient documentation

## 2020-12-09 DIAGNOSIS — I701 Atherosclerosis of renal artery: Secondary | ICD-10-CM

## 2020-12-09 DIAGNOSIS — I129 Hypertensive chronic kidney disease with stage 1 through stage 4 chronic kidney disease, or unspecified chronic kidney disease: Secondary | ICD-10-CM | POA: Diagnosis not present

## 2020-12-09 DIAGNOSIS — K219 Gastro-esophageal reflux disease without esophagitis: Secondary | ICD-10-CM | POA: Insufficient documentation

## 2020-12-09 DIAGNOSIS — Z170 Estrogen receptor positive status [ER+]: Secondary | ICD-10-CM | POA: Insufficient documentation

## 2020-12-09 DIAGNOSIS — D0511 Intraductal carcinoma in situ of right breast: Secondary | ICD-10-CM | POA: Diagnosis not present

## 2020-12-09 DIAGNOSIS — E785 Hyperlipidemia, unspecified: Secondary | ICD-10-CM | POA: Diagnosis not present

## 2020-12-09 DIAGNOSIS — Z800 Family history of malignant neoplasm of digestive organs: Secondary | ICD-10-CM | POA: Insufficient documentation

## 2020-12-09 DIAGNOSIS — Z7982 Long term (current) use of aspirin: Secondary | ICD-10-CM | POA: Insufficient documentation

## 2020-12-09 DIAGNOSIS — Z79899 Other long term (current) drug therapy: Secondary | ICD-10-CM | POA: Diagnosis not present

## 2020-12-09 DIAGNOSIS — Z801 Family history of malignant neoplasm of trachea, bronchus and lung: Secondary | ICD-10-CM | POA: Diagnosis not present

## 2020-12-09 DIAGNOSIS — Z87891 Personal history of nicotine dependence: Secondary | ICD-10-CM | POA: Diagnosis not present

## 2020-12-09 NOTE — Progress Notes (Signed)
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   HISTORY OF PRESENT ILLNESS: Kristy [name] is a 80 y.o. female seen at the request of Dr. Brantley Stage for newly diagnosed right breast cancer.  The patient was found to have calcifications by screening mammogram in November 2021. She underwent stereotactic biopsy on 11/09/2020 which revealed ER positive DCIS with necrosis and calcifications and associated complex sclerosing lesion, the DCIS was classified as high-grade at that time.  She underwent MRI of the breast on 11/15/2020 showing enhancement around a small hematoma and biopsy tract consistent with her diagnosis of DCIS and complex sclerosing lesion there was also an indeterminate 6 mm mass adjacent but superior and posterior to the biopsy site and a second indeterminate 1.8 cm area of non-mass-like enhancement in the central right breast no evidence of abnormalities were seen in the left breast and no abnormal appearing lymph nodes were identified.  She return for additional sampling and a second biopsy of the right breast was again consistent with ductal carcinoma in situ with calcifications and necrosis again estrogen positive, PR negative felt to be high-grade.  Another biopsy of the right breast x2 was performed on 11/29/2020 again DCIS with necrosis and calcifications were seen in the first specimen and DCIS with calcifications partially involving a papillary lesion with columnar cell hyperplasia atypia and calcifications as well as fibroadenoma were again seen.  She has been offered right breast lumpectomy x3 with  Dr. Brantley Stage and plans to undergo this on 12/16/2020.  She is contacted by phone today to discuss options of adjuvant therapy.    PREVIOUS RADIATION THERAPY: No   PAST MEDICAL HISTORY:  Past Medical History:  Diagnosis Date  . Allergy   . 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 Hulda Humphrey of vascular surgery in [place]  . Arthritis    rt knee, foot  . Breast cancer ([place]) 11/29/2020  . Cataract    forming right eye   . 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 Constipation    uses stool softener PRN- uses once a week to once every 2 weeks   . Fibromuscular dysplasia ([place])   . GERD (gastroesophageal reflux disease)   . Hyperlipidemia   . Hypertension    For 16 years; urinary catecholamines within normal limits 12/09; renal Doppler ultrasound showed no evidence for renal artery stenosis  . Normal echocardiogram 4/10   LVEF 65%; no regional wall motion abnormalities; normal RV size and function; pulmonic valve had increased gradient across w/ peak gradient of about 36 mmHg (range of moderate pulmonic stenosis) followup showed normal valve  . Papilloma of breast    right  . Tinnitus of left ear        PAST SURGICAL HISTORY: Past Surgical History:  Procedure Laterality Date  . ANGIOPLASTY     2012  . BREAST BIOPSY Right 2006   benign  . BREAST EXCISIONAL BIOPSY Right 07/2017   benign  . BREAST LUMPECTOMY WITH RADIOACTIVE  SEED LOCALIZATION Right 08/14/2017   Procedure: RIGHT BREAST LUMPECTOMY WITH RADIOACTIVE SEED LOCALIZATION;  Surgeon: [name], MD;  Location: [place];  Service: General;  Laterality: Right;  . Cardiolyte  11/07   Neg  . COLONOSCOPY    . KNEE CARTILAGE SURGERY     right knee  . POLYPECTOMY    . TOTAL ABDOMINAL HYSTERECTOMY     no cervix  . UPPER GASTROINTESTINAL ENDOSCOPY       FAMILY HISTORY:  Family History  Problem Relation  Age of Onset  . Prostate cancer Father   . Lung cancer Father   . Colon cancer Maternal Uncle   . Colon cancer Paternal Uncle   . Colon polyps Sister   . Coronary artery disease Paternal Grandmother   . Goiter Other        ?  . Esophageal cancer Neg Hx   . Stomach cancer Neg Hx   . Rectal cancer Neg Hx   . Bladder Cancer Neg Hx   . Kidney cancer Neg Hx      SOCIAL HISTORY:  reports that she quit smoking about 38 years ago. She has never used smokeless tobacco. She reports that she does not drink alcohol and does not use drugs.  The patient is widowed but in a relationship and lives in [place].  She is retired from working in a financial setting.    ALLERGIES: Patient has no known allergies.   MEDICATIONS:  Current Outpatient Medications  Medication Sig Dispense Refill  . atorvastatin (LIPITOR) 20 MG tablet TAKE 1 TABLET BY MOUTH  DAILY 90 tablet 0  . metoprolol succinate (TOPROL-XL) 100 MG 24 hr tablet TAKE 1 TABLET BY MOUTH  DAILY WITH OR IMMEDIATELY  FOLLOWING A MEAL. 90 tablet 0  . Multiple Vitamin (MULTIVITAMIN WITH MINERALS) TABS tablet Take 1 tablet by mouth daily.    Marland Kitchen NIFEdipine (PROCARDIA XL/NIFEDICAL XL) 60 MG 24 hr tablet TAKE 1 TABLET BY MOUTH  DAILY 90 tablet 0  . triamterene-hydrochlorothiazide (MAXZIDE-25) 37.5-25 MG tablet Take 1 tablet by mouth daily. 90 tablet 2  . aspirin 81 MG chewable tablet Chew by mouth daily. (Patient not taking: Reported on 12/09/2020)    . pantoprazole (PROTONIX) 40 MG tablet TAKE 1 TABLET BY MOUTH  DAILY (Patient not taking: Reported on 12/09/2020) 90 tablet 3  . valACYclovir (VALTREX) 500 MG tablet Take 1 tablet (500 mg total) by mouth daily. Take daily for suppressive therapy (Patient not taking: Reported on 12/09/2020) 30 tablet 11   No current facility-administered medications for this encounter.     REVIEW OF SYSTEMS: On review of systems, the patient reports that she is doing well overall. She has been concerned about what  additional treatment she may need after witnessing her late husband receive radiation and chemotherapy for colon cancer.     PHYSICAL EXAM:  Wt Readings from Last 3 Encounters:  12/09/20 177 lb (80.3 kg)  09/28/20 176 lb (79.8 kg)  08/23/20 178 lb 8 oz (81 kg)   Unable to assess given encounter type.   ECOG = 0  0 - Asymptomatic (Fully active, able to carry on all predisease activities without restriction)  1 - Symptomatic but completely ambulatory (Restricted in physically strenuous activity but ambulatory and able to carry out work of a light or sedentary nature. For example, light housework, office work)  2 - Symptomatic, <50% in bed during the day (Ambulatory and capable of all self care but unable to carry out any work  activities. Up and about more than 50% of waking hours)  3 - Symptomatic, >50% in bed, but not bedbound (Capable of only limited self-care, confined to bed or chair 50% or more of waking hours)  4 - Bedbound (Completely disabled. Cannot carry on any self-care. Totally confined to bed or chair)  5 - Death   Eustace Pen MM, Creech RH, Tormey DC, et al. [phone]). "Toxicity and response criteria of the [facility]". [place] Oncol. 5 (6): 649-55    LABORATORY DATA:  Lab Results  Component Value Date   WBC 6.7 09/28/2020   HGB 13.8 09/28/2020   HCT 40.6 09/28/2020   MCV 95.8 09/28/2020   PLT 333.0 09/28/2020   Lab Results  Component Value Date   NA 137 09/28/2020   K 3.4 (L) 09/28/2020   CL 101 09/28/2020   CO2 29 09/28/2020   Lab Results  Component Value Date   ALT 16 09/28/2020   AST 19 09/28/2020   ALKPHOS 69 09/28/2020   BILITOT 0.4 09/28/2020      RADIOGRAPHY: MR BREAST BILATERAL W WO CONTRAST INC CAD  Result Date: 11/15/2020 CLINICAL DATA:  80 year old female recently diagnosed with high-grade ductal carcinoma in situ and complex sclerosing lesion in the lower outer right breast biopsied with stereotactic guidance on  11/09/2020. She has history of a benign excisional biopsy of the right breast in September of 2018 for a tubular adenoma with calcifications, and a benign ultrasound guided right breast biopsy in October of 2013 with pathology showing benign fibrofatty soft tissue with necrosis. No reported family history of breast cancer. LABS:  Creatinine of 0.78 mg/dL and GFR of 73.31 on 09/28/2020. EXAM: BILATERAL BREAST MRI WITH AND WITHOUT CONTRAST TECHNIQUE: Multiplanar, multisequence MR images of both breasts were obtained prior to and following the intravenous administration of 8 ml of Gadavist Three-dimensional MR images were rendered by post-processing of the original MR data on an independent workstation. The three-dimensional MR images were interpreted, and findings are reported in the following complete MRI report for this study. Three dimensional images were evaluated at the independent interpreting workstation using the DynaCAD thin client. COMPARISON:  No prior MRI available for comparison. Correlation made with prior mammogram images. FINDINGS: Breast composition: c. Heterogeneous fibroglandular tissue. Background parenchymal enhancement: Moderate. Right breast: A small hematoma with a peripherally enhancing biopsy tract is seen in the lower outer quadrant of the right breast extending from the skin surface laterally, compatible with history of a stereotactic biopsy for high-grade DCIS and a complex sclerosing lesion. No additional enhancement is seen extending from the biopsy site. In the slightly lower outer quadrant of the right breast, posterior depth, just superior and posterior to the biopsy site, there is a 6 mm enhancing mass with washout kinetics (series 6, image 100). In the superior central right breast, there is a 1.8 cm area of linear non mass enhancement with persistent kinetics (series 101, and image 65 - 69). Left breast: No suspicious mass or abnormal enhancement. There is an enhancing mass in the  inferior retroareolar left breast measuring 7 mm (series 6, image 114), however in correlation with the patient's mammograms, this corresponds with a stable calcified mass consistent with a fibroadenoma. Lymph nodes: No abnormal appearing lymph nodes. Ancillary findings:  None. IMPRESSION: 1. There is enhancement around a small hematoma and a biopsy tract in the lower outer quadrant of the right breast consistent with the site of the patient's DCIS and CSL. 2. There is an  indeterminate 6 mm enhancing mass with suspicious kinetics just superior and posterior to the biopsy site in the lower outer right breast. 3. There is an indeterminate 1.8 cm area of linear non mass enhancement in the superior central right breast. 4.  No evidence of malignancy in the left breast. RECOMMENDATION: 1. MRI guided biopsy is recommended for the 6 mm mass in the slightly lower outer right breast (series 6, image 100). 2. MRI guided biopsy is recommended for the 1.8 cm linear non mass enhancement in the superior central right breast. BI-RADS CATEGORY  4: Suspicious. Electronically Signed   By: Ammie Ferrier M.D.   On: 11/15/2020 13:03   MM CLIP PLACEMENT RIGHT  Result Date: 11/29/2020 CLINICAL DATA:  Evaluate BARBELL and CYLINDER clip following MR guided RIGHT breast biopsies. EXAM: DIAGNOSTIC RIGHT MAMMOGRAM POST MRI BIOPSY COMPARISON:  Previous exam(s). FINDINGS: Mammographic images were obtained following MR guided biopsies of UPPER RIGHT breast non masslike enhancement (BARBELL clip) and 0.6 cm UPPER-OUTER RIGHT breast mass (CYLINDER clip). The BARBELL biopsy marking clip is in expected position at the site of biopsy of the 1.8 cm UPPER RIGHT breast non masslike enhancement. The CYLINDER biopsy marking clip is in expected position at the site of biopsy of the 0.6 cm UPPER-OUTER RIGHT breast mass. IMPRESSION: Appropriate positioning of the BARBELL shaped biopsy marking clip at the site of biopsy in the UPPER RIGHT breast.  Appropriate positioning of the CYLINDER biopsy marking clip at the site of biopsy in the UPPER OUTER RIGHT breast. Final Assessment: Post Procedure Mammograms for Marker Placement Electronically Signed   By: Margarette Canada M.D.   On: 11/29/2020 11:05   MM CLIP PLACEMENT RIGHT  Result Date: 11/23/2020 CLINICAL DATA:  Post biopsy mammogram of the right breast for clip placement. EXAM: DIAGNOSTIC RIGHT MAMMOGRAM POST STEREOTACTIC BIOPSY COMPARISON:  Previous exam(s). FINDINGS: Mammographic images were obtained following stereotactic guided biopsy of calcifications in the lower outer right breast. The biopsy marking clip is in expected position at the site of biopsy. The X shaped biopsy marking clip lies approximately 2 cm anterior to the coil shaped biopsy marking clip at the site of known DCIS/CSL. IMPRESSION: Appropriate positioning of the X shaped biopsy marking clip at the site of biopsy in the lower outer right breast, 2 cm anterior to the coil shaped biopsy marking clip from the DCIS/CSL. Final Assessment: Post Procedure Mammograms for Marker Placement Electronically Signed   By: Ammie Ferrier M.D.   On: 11/23/2020 11:21   MM RT BREAST BX W LOC DEV 1ST LESION IMAGE BX SPEC STEREO GUIDE  Addendum Date: 11/25/2020   ADDENDUM REPORT: 11/24/2020 11:47 ADDENDUM: Pathology revealed HIGH GRADE DUCTAL CARCINOMA IN SITU WITH CALCIFICATIONS AND NECROSIS of the Right breast, lower outer. This was found to be concordant by Dr. Ammie Ferrier. Pathology results were discussed with the patient by telephone. The patient reported doing well after the biopsy with tenderness at the site. Post biopsy instructions and care were reviewed and questions were answered. The patient was encouraged to call [facility] for any additional concerns. My direct phone number was provided. The patient has a recent diagnosis of Right breast cancer and is scheduled to see Dr. [name] at [facility] on November 26, 2020. The patient is scheduled for Right breast MRI guided biopsy x 2 on November 29, 2020. Further recommendations will be guided by the results of these biopsies. Pathology results reported by Terie Purser, RN on 11/24/2020.  Electronically Signed   By: Ammie Ferrier M.D.   On: 11/24/2020 11:47   Result Date: 11/25/2020 CLINICAL DATA:  80 year old female with recent diagnosis of high-grade DCIS and CSL in the right breast presenting for additional biopsy of calcifications in the right breast. EXAM: RIGHT BREAST STEREOTACTIC CORE NEEDLE BIOPSY COMPARISON:  Previous exams. FINDINGS: The patient and I discussed the procedure of stereotactic-guided biopsy including benefits and alternatives. We discussed the high likelihood of a successful procedure. We discussed the risks of the procedure including infection, bleeding, tissue injury, clip migration, and inadequate sampling. Informed written consent was given. The usual time out protocol was performed immediately prior to the procedure. Using sterile technique and 1% Lidocaine as local anesthetic, under stereotactic guidance, a 9 gauge vacuum assisted device was used to perform core needle biopsy of calcifications in the lower outer quadrant of the right breast using a lateral approach. Specimen radiograph was performed showing calcifications in multiple core samples. Specimens with calcifications are identified for pathology. Lesion quadrant: Lower outer quadrant At the conclusion of the procedure, an X shaped tissue marker clip was deployed into the biopsy cavity. Follow-up 2-view mammogram was performed and dictated separately. IMPRESSION: Stereotactic-guided biopsy of calcifications in the lower outer right breast. No apparent complications. Electronically Signed: By: Ammie Ferrier M.D. On: 11/23/2020 11:14   MR RT BREAST BX W LOC DEV 1ST LESION IMAGE BX SPEC MR GUIDE  Addendum Date: 12/01/2020   ADDENDUM REPORT: 11/30/2020 14:18  ADDENDUM: Pathology revealed DUCTAL CARCINOMA IN SITU WITH NECROSIS AND CALCIFICATIONS of the RIGHT breast, upper central. This was found to be concordant by Dr. Hassan Rowan. 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 This was found to be concordant by Dr. Hassan Rowan. Pathology results were discussed with the patient by telephone. The patient reported doing well after the biopsies with tenderness at the sites. Post biopsy instructions and care were reviewed and questions were answered. The patient was encouraged to call [facility] for any additional concerns. The patient has a recent diagnosis of right breast cancer and should follow her outlined treatment plan. Pathology results reported by Stacie Acres RN on 11/30/2020. Electronically Signed   By: Margarette Canada M.D.   On: 11/30/2020 14:18   Result Date: 12/01/2020 CLINICAL DATA:  80 year old female for tissue sampling of 1.8 cm UPPER central RIGHT breast non masslike enhancement and tissue sampling of 0.6 cm slightly LOWER OUTER RIGHT breast mass. Recent diagnosis of RIGHT breast cancer/DCIS. EXAM: MRI GUIDED CORE NEEDLE BIOPSY OF THE RIGHT BREAST x 2 TECHNIQUE: Multiplanar, multisequence MR imaging of the RIGHT breast was performed both before and after administration of intravenous contrast. CONTRAST:  67m GADAVIST GADOBUTROL 1 MMOL/ML IV SOLN COMPARISON:  Previous exams. FINDINGS: I met with the patient, and we discussed the procedure of MRI guided biopsy, including risks, benefits, and alternatives. Specifically, we discussed the risks of infection, bleeding, tissue injury, clip migration, and inadequate sampling. Informed, written consent was given. The usual time out protocol was performed immediately prior to the procedure. MRI GUIDED CORE NEEDLE BIOPSY OF THE RIGHT BREAST  #1 (1.8 cm UPPER central non masslike enhancement - BARBELL clip): Using sterile technique, 1% Lidocaine, MRI guidance, and a 9 gauge vacuum assisted device, biopsy was performed of the 1.8 cm area of non masslike enhancement within the UPPER central RIGHT breast using a LATERAL  approach. At the conclusion of the procedure, a BARBELL tissue marker clip was deployed into the biopsy cavity. Follow-up 2-view mammogram was performed and dictated separately. MRI GUIDED CORE NEEDLE BIOPSY OF THE RIGHT BREAST #2 (0.6 cm UPPER-OUTER RIGHT breast mass - CYLINDER clip): Using sterile technique, 1% Lidocaine, MRI guidance, and a 9 gauge vacuum assisted device, biopsy was performed of the 0.6 cm mass in the slightly UPPER OUTER RIGHT breast using a LATERAL approach. At the conclusion of the procedure, a CYLINDER tissue marker clip was deployed into the biopsy cavity. Follow-up 2-view mammogram was performed and dictated separately. IMPRESSION: MRI guided biopsies of the 1.8 cm UPPER central non masslike enhancement (BARBELL clip) and the 0.6 cm UPPER-OUTER RIGHT breast mass (CYLINDER clip). Electronically Signed: By: Margarette Canada M.D. 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 This was found to be concordant by Dr. Hassan Rowan. 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 This was found to be concordant by Dr. Hassan Rowan. Pathology results were discussed with the patient by telephone. The patient reported doing well after the biopsies with tenderness at the sites. Post biopsy instructions and care were reviewed and questions were answered. The  patient was encouraged to call [facility] for any additional concerns. The patient has a recent diagnosis of right breast cancer and should follow her outlined treatment plan. Pathology results reported by Stacie Acres RN on 11/30/2020. Electronically Signed   By: Margarette Canada M.D.   On: 11/30/2020 14:18   Result Date: 12/01/2020 CLINICAL DATA:  80 year old female for tissue sampling of 1.8 cm UPPER central RIGHT breast non masslike enhancement and tissue sampling of 0.6 cm slightly LOWER OUTER RIGHT breast mass. Recent diagnosis of RIGHT breast cancer/DCIS. EXAM: MRI GUIDED CORE NEEDLE BIOPSY OF THE RIGHT BREAST x 2 TECHNIQUE: Multiplanar, multisequence MR imaging of the RIGHT breast was performed both before and after administration of intravenous contrast. CONTRAST:  46m GADAVIST GADOBUTROL 1 MMOL/ML IV SOLN COMPARISON:  Previous exams. FINDINGS: I met with the patient, and we discussed the procedure of MRI guided biopsy, including risks, benefits, and alternatives. Specifically, we discussed the risks of infection, bleeding, tissue injury, clip migration, and inadequate sampling. Informed, written consent was given. The usual time out protocol was performed immediately prior to the procedure. MRI GUIDED CORE NEEDLE BIOPSY OF THE RIGHT BREAST #1 (1.8 cm UPPER central non masslike enhancement - BARBELL clip): Using sterile technique, 1% Lidocaine, MRI guidance, and a 9 gauge vacuum assisted device, biopsy was performed of the 1.8 cm area of non masslike enhancement within the UPPER central RIGHT breast using a LATERAL approach. At the conclusion of the procedure, a BARBELL tissue marker clip was deployed into the biopsy cavity. Follow-up 2-view mammogram was performed and dictated separately. MRI GUIDED CORE NEEDLE BIOPSY OF THE RIGHT BREAST #2 (0.6 cm UPPER-OUTER RIGHT breast mass - CYLINDER clip): Using sterile technique, 1% Lidocaine, MRI guidance, and a 9 gauge vacuum assisted  device, biopsy was performed of the 0.6 cm mass in the slightly UPPER OUTER RIGHT breast using a LATERAL approach. At the conclusion of the procedure, a CYLINDER tissue marker  clip was deployed into the biopsy cavity. Follow-up 2-view mammogram was performed and dictated separately. IMPRESSION: MRI guided biopsies of the 1.8 cm UPPER central non masslike enhancement (BARBELL clip) and the 0.6 cm UPPER-OUTER RIGHT breast mass (CYLINDER clip). Electronically Signed: By: Margarette Canada M.D. On: 11/29/2020 10:20       IMPRESSION/PLAN: 1. 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 Dr. Lisbeth Renshaw discusses the pathology findings and reviews the nature of noninvasive breast disease.  Despite the multiple biopsies she has had, and frustrations over this, she is pleased to be able to still preserve her breast, she plans to undergo lumpectomy x3  next week with Dr. Brantley Stage.  Dr. Lisbeth Renshaw discusses the rationale for external radiotherapy to the breast  to reduce risks of local recurrence followed by antiestrogen therapy. We discussed the risks, benefits, short, and long term effects of radiotherapy, as well as the curative intent, and the patient is interested in proceeding. Dr. Lisbeth Renshaw discusses the delivery and logistics of radiotherapy and anticipates a course of 4 weeks of radiotherapy. We will see her back a few weeks after surgery to discuss the simulation process and anticipate we starting radiotherapy about 4-6 weeks after surgery.    In a visit lasting 60 minutes, greater than 50% of the time was spent face to face reviewing her case, as well as in preparation of, discussing, and coordinating the patient's care.  The above documentation reflects my direct findings during this shared patient visit. Please see the separate note by Dr. Lisbeth Renshaw on this date for the remainder of the patient's plan of care.    Carola Rhine, PAC

## 2020-12-10 ENCOUNTER — Encounter: Payer: Self-pay | Admitting: Licensed Clinical Social Worker

## 2020-12-10 ENCOUNTER — Encounter (HOSPITAL_BASED_OUTPATIENT_CLINIC_OR_DEPARTMENT_OTHER): Payer: Self-pay | Admitting: Surgery

## 2020-12-10 ENCOUNTER — Other Ambulatory Visit: Payer: Self-pay

## 2020-12-10 NOTE — Progress Notes (Signed)
[facility]  Initial Assessment   Kristy [name] is a 80 y.o. year old female contacted by phone. Clinical Social Work was referred by distress screen for assessment of psychosocial needs.   SDOH (Social Determinants of Health) assessments performed: No   Distress Screen completed: Yes ONCBCN DISTRESS SCREENING 12/09/2020  Screening Type Initial Screening  Distress experienced in past week (1-10) 5  Emotional problem type Adjusting to illness  Spiritual/Religous concerns type Relating to God  Information Concerns Type Lack of info about treatment  Other Contact via phone      Family/Social Information:  . Housing Arrangement: patient lives with boyfriend . Family members/support persons in your life? Boyfriend, niece, sisters . Transportation concerns: no  . Employment: Retired. Income source: Conservation officer, historic buildings . Financial concerns: No. Has cancer policy with insurance, so hopes that will help cover costs o Type of concern: None . Food access concerns: no . Religious or spiritual practice: yes, faith and reading the Bible are helping her through this time . Medication Concerns: no  . Services Currently in place:  n/a  Coping/ Adjustment to diagnosis: . Patient understands treatment plan and what happens next? yes . Concerns about diagnosis and/or treatment: I'm not especially worried about anything . Patient reported stressors: Adjusting to my illness . Current coping skills/ strengths: Capable of independent living, Communication skills, Motivation for treatment/growth, Religious Affiliation and Supportive family/friends    SUMMARY: Current SDOH Barriers:  . No significant SDOH barriers noted today  Clinical Social Work Clinical Goal(s):  Marland Kitchen No clinical social work goals currently  Interventions: . Discussed common feeling and emotions when being diagnosed with cancer, and the importance of support during treatment . Informed patient of the  support team roles and support services at [facility] . Provided CSW contact information and encouraged patient to call with any questions or concerns . Provided education regarding breast cancer foundations should patient need in the future   Follow Up Plan: Patient will contact CSW with any support or resource needs Patient verbalizes understanding of plan: Yes    Kristy [name] , LCSW

## 2020-12-11 ENCOUNTER — Other Ambulatory Visit (HOSPITAL_COMMUNITY): Payer: Medicare Other

## 2020-12-13 ENCOUNTER — Other Ambulatory Visit (HOSPITAL_COMMUNITY)
Admission: RE | Admit: 2020-12-13 | Discharge: 2020-12-13 | Disposition: A | Discharge status: Home / Self Care | Payer: Medicare Other | Source: Ambulatory Visit | Attending: Surgery | Admitting: Surgery

## 2020-12-13 ENCOUNTER — Encounter (HOSPITAL_BASED_OUTPATIENT_CLINIC_OR_DEPARTMENT_OTHER)
Admission: RE | Admit: 2020-12-13 | Discharge: 2020-12-13 | Disposition: A | Discharge status: Home / Self Care | Payer: Medicare Other | Source: Ambulatory Visit | Attending: Surgery | Admitting: Surgery

## 2020-12-13 DIAGNOSIS — Z01812 Encounter for preprocedural laboratory examination: Secondary | ICD-10-CM | POA: Insufficient documentation

## 2020-12-13 DIAGNOSIS — Z20822 Contact with and (suspected) exposure to covid-19: Secondary | ICD-10-CM | POA: Insufficient documentation

## 2020-12-13 LAB — CBC WITH DIFFERENTIAL/PLATELET
Abs Immature Granulocytes: 0.01 10*3/uL (ref 0.00–0.07)
Basophils Absolute: 0 10*3/uL (ref 0.0–0.1)
Basophils Relative: 1 %
Eosinophils Absolute: 0.1 10*3/uL (ref 0.0–0.5)
Eosinophils Relative: 2 %
HCT: 41.1 % (ref 36.0–46.0)
Hemoglobin: 13.6 g/dL (ref 12.0–15.0)
Immature Granulocytes: 0 %
Lymphocytes Relative: 48 %
Lymphs Abs: 3.9 10*3/uL (ref 0.7–4.0)
MCH: 31.9 pg (ref 26.0–34.0)
MCHC: 33.1 g/dL (ref 30.0–36.0)
MCV: 96.5 fL (ref 80.0–100.0)
Monocytes Absolute: 0.5 10*3/uL (ref 0.1–1.0)
Monocytes Relative: 7 %
Neutro Abs: 3.4 10*3/uL (ref 1.7–7.7)
Neutrophils Relative %: 42 %
Platelets: 359 10*3/uL (ref 150–400)
RBC: 4.26 MIL/uL (ref 3.87–5.11)
RDW: 12.6 % (ref 11.5–15.5)
WBC: 8 10*3/uL (ref 4.0–10.5)
nRBC: 0 % (ref 0.0–0.2)

## 2020-12-13 LAB — COMPREHENSIVE METABOLIC PANEL
ALT: 17 U/L (ref 0–44)
AST: 19 U/L (ref 15–41)
Albumin: 3.8 g/dL (ref 3.5–5.0)
Alkaline Phosphatase: 68 U/L (ref 38–126)
Anion gap: 12 (ref 5–15)
BUN: 12 mg/dL (ref 8–23)
CO2: 24 mmol/L (ref 22–32)
Calcium: 10.1 mg/dL (ref 8.9–10.3)
Chloride: 102 mmol/L (ref 98–111)
Creatinine, Ser: 0.85 mg/dL (ref 0.44–1.00)
GFR, Estimated: >60 mL/min (ref >60)
Glucose, Bld: 115 mg/dL — ABNORMAL HIGH (ref 70–99)
Potassium: 4.1 mmol/L (ref 3.5–5.1)
Sodium: 138 mmol/L (ref 135–145)
Total Bilirubin: 0.9 mg/dL (ref 0.3–1.2)
Total Protein: 7.2 g/dL (ref 6.5–8.1)

## 2020-12-13 LAB — SARS CORONAVIRUS 2 (TAT 6-24 HRS): SARS Coronavirus 2: NEGATIVE

## 2020-12-13 NOTE — Progress Notes (Signed)

## 2020-12-14 ENCOUNTER — Ambulatory Visit: Admit: 2020-12-14 | Payer: Medicare Other | Admitting: Surgery

## 2020-12-14 SURGERY — LAPAROSCOPIC CHOLECYSTECTOMY WITH INTRAOPERATIVE CHOLANGIOGRAM
Anesthesia: General

## 2020-12-16 ENCOUNTER — Ambulatory Visit
Admission: RE | Admit: 2020-12-16 | Discharge: 2020-12-16 | Disposition: A | Discharge status: Home / Self Care | Payer: Medicare Other | Source: Ambulatory Visit | Attending: Surgery | Admitting: Surgery

## 2020-12-16 ENCOUNTER — Ambulatory Visit (HOSPITAL_BASED_OUTPATIENT_CLINIC_OR_DEPARTMENT_OTHER): Payer: Medicare Other | Admitting: Anesthesiology

## 2020-12-16 ENCOUNTER — Other Ambulatory Visit: Payer: Self-pay

## 2020-12-16 ENCOUNTER — Encounter (HOSPITAL_BASED_OUTPATIENT_CLINIC_OR_DEPARTMENT_OTHER): Payer: Self-pay | Admitting: Surgery

## 2020-12-16 ENCOUNTER — Encounter (HOSPITAL_BASED_OUTPATIENT_CLINIC_OR_DEPARTMENT_OTHER)
Admission: RE | Disposition: A | Discharge status: Home / Self Care | Payer: Self-pay | Source: Home / Self Care | Attending: Surgery

## 2020-12-16 ENCOUNTER — Other Ambulatory Visit: Payer: Self-pay | Admitting: Surgery

## 2020-12-16 ENCOUNTER — Ambulatory Visit (HOSPITAL_BASED_OUTPATIENT_CLINIC_OR_DEPARTMENT_OTHER)
Admission: RE | Admit: 2020-12-16 | Discharge: 2020-12-16 | Disposition: A | Discharge status: Home / Self Care | Payer: Medicare Other | Attending: Surgery | Admitting: Surgery

## 2020-12-16 DIAGNOSIS — N641 Fat necrosis of breast: Secondary | ICD-10-CM | POA: Diagnosis not present

## 2020-12-16 DIAGNOSIS — Z170 Estrogen receptor positive status [ER+]: Secondary | ICD-10-CM | POA: Insufficient documentation

## 2020-12-16 DIAGNOSIS — D0511 Intraductal carcinoma in situ of right breast: Secondary | ICD-10-CM

## 2020-12-16 DIAGNOSIS — Z79899 Other long term (current) drug therapy: Secondary | ICD-10-CM | POA: Diagnosis not present

## 2020-12-16 DIAGNOSIS — N6091 Unspecified benign mammary dysplasia of right breast: Secondary | ICD-10-CM | POA: Diagnosis not present

## 2020-12-16 DIAGNOSIS — Z7982 Long term (current) use of aspirin: Secondary | ICD-10-CM | POA: Diagnosis not present

## 2020-12-16 DIAGNOSIS — E213 Hyperparathyroidism, unspecified: Secondary | ICD-10-CM | POA: Diagnosis not present

## 2020-12-16 DIAGNOSIS — D241 Benign neoplasm of right breast: Secondary | ICD-10-CM | POA: Insufficient documentation

## 2020-12-16 DIAGNOSIS — I739 Peripheral vascular disease, unspecified: Secondary | ICD-10-CM | POA: Diagnosis not present

## 2020-12-16 DIAGNOSIS — K8020 Calculus of gallbladder without cholecystitis without obstruction: Secondary | ICD-10-CM | POA: Diagnosis not present

## 2020-12-16 DIAGNOSIS — N6489 Other specified disorders of breast: Secondary | ICD-10-CM | POA: Insufficient documentation

## 2020-12-16 DIAGNOSIS — Z87891 Personal history of nicotine dependence: Secondary | ICD-10-CM | POA: Diagnosis not present

## 2020-12-16 DIAGNOSIS — N6012 Diffuse cystic mastopathy of left breast: Secondary | ICD-10-CM | POA: Diagnosis not present

## 2020-12-16 DIAGNOSIS — N6081 Other benign mammary dysplasias of right breast: Secondary | ICD-10-CM | POA: Diagnosis not present

## 2020-12-16 DIAGNOSIS — K219 Gastro-esophageal reflux disease without esophagitis: Secondary | ICD-10-CM | POA: Diagnosis not present

## 2020-12-16 DIAGNOSIS — I10 Essential (primary) hypertension: Secondary | ICD-10-CM | POA: Diagnosis not present

## 2020-12-16 HISTORY — PX: BREAST LUMPECTOMY: SHX2

## 2020-12-16 HISTORY — PX: BREAST LUMPECTOMY WITH RADIOACTIVE SEED LOCALIZATION: SHX6424

## 2020-12-16 SURGERY — BREAST LUMPECTOMY WITH RADIOACTIVE SEED LOCALIZATION
Anesthesia: General | Site: Breast | Laterality: Right

## 2020-12-16 MED ORDER — FENTANYL CITRATE (PF) 100 MCG/2ML IJ SOLN
INTRAMUSCULAR | Status: AC
  Filled 2020-12-16: qty 2

## 2020-12-16 MED ORDER — CHLORHEXIDINE GLUCONATE CLOTH 2 % EX PADS: 6.0000 | MEDICATED_PAD | Freq: Once | CUTANEOUS | Status: DC

## 2020-12-16 MED ORDER — OXYCODONE HCL 5 MG PO TABS
5.0000 mg | ORAL_TABLET | Freq: Once | ORAL | Status: AC | PRN
  Administered 2020-12-16: 5 mg via ORAL

## 2020-12-16 MED ORDER — GABAPENTIN 300 MG PO CAPS
300.0000 mg | ORAL_CAPSULE | ORAL | Status: AC
  Administered 2020-12-16: 300 mg via ORAL

## 2020-12-16 MED ORDER — HYDROMORPHONE HCL 1 MG/ML IJ SOLN: 0.2500 mg | INTRAMUSCULAR | Status: DC | PRN

## 2020-12-16 MED ORDER — AMISULPRIDE (ANTIEMETIC) 5 MG/2ML IV SOLN: 10.0000 mg | Freq: Once | INTRAVENOUS | Status: DC | PRN

## 2020-12-16 MED ORDER — EPHEDRINE SULFATE 50 MG/ML IJ SOLN
INTRAMUSCULAR | Status: DC | PRN
  Administered 2020-12-16 (×2): 10 mg via INTRAVENOUS

## 2020-12-16 MED ORDER — FENTANYL CITRATE (PF) 100 MCG/2ML IJ SOLN
INTRAMUSCULAR | Status: DC | PRN
  Administered 2020-12-16: 25 ug via INTRAVENOUS
  Administered 2020-12-16: 50 ug via INTRAVENOUS
  Administered 2020-12-16 (×3): 25 ug via INTRAVENOUS

## 2020-12-16 MED ORDER — DEXAMETHASONE SODIUM PHOSPHATE 10 MG/ML IJ SOLN
INTRAMUSCULAR | Status: AC
  Filled 2020-12-16: qty 1

## 2020-12-16 MED ORDER — LACTATED RINGERS IV SOLN: INTRAVENOUS | Status: DC

## 2020-12-16 MED ORDER — ACETAMINOPHEN 500 MG PO TABS
1000.0000 mg | ORAL_TABLET | ORAL | Status: AC
  Administered 2020-12-16: 1000 mg via ORAL

## 2020-12-16 MED ORDER — PROPOFOL 10 MG/ML IV BOLUS
INTRAVENOUS | Status: AC
  Filled 2020-12-16: qty 20

## 2020-12-16 MED ORDER — PROPOFOL 10 MG/ML IV BOLUS
INTRAVENOUS | Status: DC | PRN
  Administered 2020-12-16: 140 mg via INTRAVENOUS

## 2020-12-16 MED ORDER — OXYCODONE HCL 5 MG PO TABS
ORAL_TABLET | ORAL | Status: AC
  Filled 2020-12-16: qty 1

## 2020-12-16 MED ORDER — OXYCODONE HCL 5 MG/5ML PO SOLN: 5.0000 mg | Freq: Once | ORAL | Status: AC | PRN

## 2020-12-16 MED ORDER — DEXAMETHASONE SODIUM PHOSPHATE 4 MG/ML IJ SOLN
INTRAMUSCULAR | Status: DC | PRN
  Administered 2020-12-16: 5 mg via INTRAVENOUS

## 2020-12-16 MED ORDER — GABAPENTIN 300 MG PO CAPS
ORAL_CAPSULE | ORAL | Status: AC
  Filled 2020-12-16: qty 1

## 2020-12-16 MED ORDER — ONDANSETRON HCL 4 MG/2ML IJ SOLN
INTRAMUSCULAR | Status: DC | PRN
  Administered 2020-12-16: 4 mg via INTRAVENOUS

## 2020-12-16 MED ORDER — ACETAMINOPHEN 500 MG PO TABS
ORAL_TABLET | ORAL | Status: AC
  Filled 2020-12-16: qty 2

## 2020-12-16 MED ORDER — 0.9 % SODIUM CHLORIDE (POUR BTL) OPTIME
TOPICAL | Status: DC | PRN
  Administered 2020-12-16: 1000 mL

## 2020-12-16 MED ORDER — HYDROCODONE-ACETAMINOPHEN 5-325 MG PO TABS: 1.0000 | ORAL_TABLET | Freq: Four times a day (QID) | ORAL | 0 refills | Status: DC | PRN

## 2020-12-16 MED ORDER — BUPIVACAINE HCL 0.25 % IJ SOLN
INTRAMUSCULAR | Status: DC | PRN
  Administered 2020-12-16: 20 mL

## 2020-12-16 MED ORDER — LIDOCAINE 2% (20 MG/ML) 5 ML SYRINGE
INTRAMUSCULAR | Status: AC
  Filled 2020-12-16: qty 5

## 2020-12-16 MED ORDER — CEFAZOLIN SODIUM-DEXTROSE 2-4 GM/100ML-% IV SOLN
2.0000 g | INTRAVENOUS | Status: AC
  Administered 2020-12-16: 2 g via INTRAVENOUS

## 2020-12-16 MED ORDER — CEFAZOLIN SODIUM-DEXTROSE 2-4 GM/100ML-% IV SOLN
INTRAVENOUS | Status: AC
  Filled 2020-12-16: qty 100

## 2020-12-16 MED ORDER — ONDANSETRON HCL 4 MG/2ML IJ SOLN
INTRAMUSCULAR | Status: AC
  Filled 2020-12-16: qty 2

## 2020-12-16 MED ORDER — PROMETHAZINE HCL 25 MG/ML IJ SOLN: 6.2500 mg | INTRAMUSCULAR | Status: DC | PRN

## 2020-12-16 MED ORDER — LIDOCAINE 2% (20 MG/ML) 5 ML SYRINGE
INTRAMUSCULAR | Status: DC | PRN
  Administered 2020-12-16: 60 mg via INTRAVENOUS

## 2020-12-16 SURGICAL SUPPLY — 52 items
ADH SKN CLS APL DERMABOND .7 (GAUZE/BANDAGES/DRESSINGS) ×1
APL PRP STRL LF DISP 70% ISPRP (MISCELLANEOUS) ×1
APPLIER CLIP 9.375 MED OPEN (MISCELLANEOUS) ×2
APR CLP MED 9.3 20 MLT OPN (MISCELLANEOUS) ×1
BINDER BREAST LRG (GAUZE/BANDAGES/DRESSINGS) IMPLANT
BINDER BREAST MEDIUM (GAUZE/BANDAGES/DRESSINGS) IMPLANT
BINDER BREAST XLRG (GAUZE/BANDAGES/DRESSINGS) ×1 IMPLANT
BINDER BREAST XXLRG (GAUZE/BANDAGES/DRESSINGS) IMPLANT
BLADE SURG 15 STRL LF DISP TIS (BLADE) ×1 IMPLANT
BLADE SURG 15 STRL SS (BLADE) ×2
CANISTER SUC SOCK COL 7IN (MISCELLANEOUS) IMPLANT
CANISTER SUCT 1200ML W/VALVE (MISCELLANEOUS) IMPLANT
CHLORAPREP W/TINT 26 (MISCELLANEOUS) ×2 IMPLANT
CLIP APPLIE 9.375 MED OPEN (MISCELLANEOUS) IMPLANT
COVER BACK TABLE 60X90IN (DRAPES) ×2 IMPLANT
COVER MAYO STAND STRL (DRAPES) ×2 IMPLANT
COVER PROBE W GEL 5X96 (DRAPES) ×2 IMPLANT
COVER WAND RF STERILE (DRAPES) IMPLANT
DECANTER SPIKE VIAL GLASS SM (MISCELLANEOUS) IMPLANT
DERMABOND ADVANCED (GAUZE/BANDAGES/DRESSINGS) ×1
DERMABOND ADVANCED .7 DNX12 (GAUZE/BANDAGES/DRESSINGS) ×1 IMPLANT
DRAPE LAPAROSCOPIC ABDOMINAL (DRAPES) IMPLANT
DRAPE LAPAROTOMY 100X72 PEDS (DRAPES) ×2 IMPLANT
DRAPE UTILITY XL STRL (DRAPES) ×2 IMPLANT
ELECT COATED BLADE 2.86 ST (ELECTRODE) ×2 IMPLANT
ELECT REM PT RETURN 9FT ADLT (ELECTROSURGICAL) ×2
ELECTRODE REM PT RTRN 9FT ADLT (ELECTROSURGICAL) ×1 IMPLANT
GLOVE ECLIPSE 8.0 STRL XLNG CF (GLOVE) ×2 IMPLANT
GLOVE SRG 8 PF TXTR STRL LF DI (GLOVE) ×1 IMPLANT
GLOVE SURG UNDER POLY LF SZ8 (GLOVE) ×2
GOWN STRL REUS W/ TWL LRG LVL3 (GOWN DISPOSABLE) ×2 IMPLANT
GOWN STRL REUS W/ TWL XL LVL3 (GOWN DISPOSABLE) ×1 IMPLANT
GOWN STRL REUS W/TWL LRG LVL3 (GOWN DISPOSABLE) ×4
GOWN STRL REUS W/TWL XL LVL3 (GOWN DISPOSABLE) ×2
HEMOSTAT ARISTA ABSORB 3G PWDR (HEMOSTASIS) IMPLANT
HEMOSTAT SNOW SURGICEL 2X4 (HEMOSTASIS) IMPLANT
KIT MARKER MARGIN INK (KITS) ×2 IMPLANT
NDL HYPO 25X1 1.5 SAFETY (NEEDLE) ×1 IMPLANT
NEEDLE HYPO 25X1 1.5 SAFETY (NEEDLE) ×2 IMPLANT
NS IRRIG 1000ML POUR BTL (IV SOLUTION) ×2 IMPLANT
PACK BASIN DAY SURGERY FS (CUSTOM PROCEDURE TRAY) ×2 IMPLANT
PENCIL SMOKE EVACUATOR (MISCELLANEOUS) ×2 IMPLANT
SLEEVE SCD COMPRESS KNEE MED (MISCELLANEOUS) ×2 IMPLANT
SPONGE LAP 4X18 RFD (DISPOSABLE) ×2 IMPLANT
SUT MNCRL AB 4-0 PS2 18 (SUTURE) ×2 IMPLANT
SUT SILK 2 0 SH (SUTURE) IMPLANT
SUT VICRYL 3-0 CR8 SH (SUTURE) ×2 IMPLANT
SYR CONTROL 10ML LL (SYRINGE) ×2 IMPLANT
TOWEL GREEN STERILE FF (TOWEL DISPOSABLE) ×2 IMPLANT
TRAY FAXITRON CT DISP (TRAY / TRAY PROCEDURE) ×2 IMPLANT
TUBE CONNECTING 20X1/4 (TUBING) IMPLANT
YANKAUER SUCT BULB TIP NO VENT (SUCTIONS) IMPLANT

## 2020-12-16 NOTE — Anesthesia Postprocedure Evaluation (Signed)
Anesthesia Post Note  Patient: Kristy [name]  Procedure(s) Performed: RADIOACTIVE SEED GUIDED TIMES 3 RIGHT BREAST LUMPECTOMY (Right Breast)     Patient location during evaluation: PACU Anesthesia Type: General Level of consciousness: awake and alert Pain management: pain level controlled Vital Signs Assessment: post-procedure vital signs reviewed and stable Respiratory status: spontaneous breathing, nonlabored ventilation and respiratory function stable Cardiovascular status: blood pressure returned to baseline and stable Postop Assessment: no apparent nausea or vomiting Anesthetic complications: no   No complications documented.  Last Vitals:  Vitals:   12/16/20 1415 12/16/20 1430  BP: 114/64 123/64  Pulse: (!) 59 69  Resp: 13 12  Temp: 36.4 C   SpO2: 97% 100%    Last Pain:  Vitals:   12/16/20 1430  TempSrc:   PainSc: 0-No pain                 Lynda Rainwater

## 2020-12-16 NOTE — Interval H&P Note (Signed)
History and Physical Interval Note:  12/16/2020 12:50 PM  Kristy [name]  has presented today for surgery, with the diagnosis of DUCTAL CARCINOMA IN SITU RIGHT BREAST.  The various methods of treatment have been discussed with the patient and family. After consideration of risks, benefits and other options for treatment, the patient has consented to  Procedure(s): RADIOACTIVE SEED GUIDED TIMES 3 RIGHT BREAST LUMPECTOMY (Right) as a surgical intervention.  The patient's history has been reviewed, patient examined, no change in status, stable for surgery.  I have reviewed the patient's chart and labs.  Questions were answered to the patient's satisfaction.     [place]

## 2020-12-16 NOTE — Discharge Instructions (Signed)
[facility] °Office Phone Number [phone] ° °BREAST BIOPSY/ PARTIAL MASTECTOMY: POST OP INSTRUCTIONS ° °Always review your discharge instruction sheet given to you by the facility where your surgery was performed. ° °IF YOU HAVE DISABILITY OR FAMILY LEAVE FORMS, YOU MUST BRING THEM TO THE OFFICE FOR PROCESSING.  DO NOT GIVE THEM TO YOUR DOCTOR. ° °1. A prescription for pain medication may be given to you upon discharge.  Take your pain medication as prescribed, if needed.  If narcotic pain medicine is not needed, then you may take acetaminophen (Tylenol) or ibuprofen (Advil) as needed. °2. Take your usually prescribed medications unless otherwise directed °3. If you need a refill on your pain medication, please contact your pharmacy.  They will contact our office to request authorization.  Prescriptions will not be filled after 5pm or on week-ends. °4. You should eat very light the first 24 hours after surgery, such as soup, crackers, pudding, etc.  Resume your normal diet the day after surgery. °5. Most patients will experience some swelling and bruising in the breast.  Ice packs and a good support bra will help.  Swelling and bruising can take several days to resolve.  °6. It is common to experience some constipation if taking pain medication after surgery.  Increasing fluid intake and taking a stool softener will usually help or prevent this problem from occurring.  A mild laxative (Milk of Magnesia or Miralax) should be taken according to package directions if there are no bowel movements after 48 hours. °7. Unless discharge instructions indicate otherwise, you may remove your bandages 24-48 hours after surgery, and you may shower at that time.  You may have steri-strips (small skin tapes) in place directly over the incision.  These strips should be left on the skin for 7-10 days.  If your surgeon used skin glue on the incision, you may shower in 24 hours.  The glue will flake off over the  next 2-3 weeks.  Any sutures or staples will be removed at the office during your follow-up visit. °8. ACTIVITIES:  You may resume regular daily activities (gradually increasing) beginning the next day.  Wearing a good support bra or sports bra minimizes pain and swelling.  You may have sexual intercourse when it is comfortable. °a. You may drive when you no longer are taking prescription pain medication, you can comfortably wear a seatbelt, and you can safely maneuver your car and apply brakes. °b. RETURN TO WORK:  ______________________________________________________________________________________ °9. You should see your doctor in the office for a follow-up appointment approximately two weeks after your surgery.  Your doctor’s nurse will typically make your follow-up appointment when she calls you with your pathology report.  Expect your pathology report 2-3 business days after your surgery.  You may call to check if you do not hear from us after three days. °10. OTHER INSTRUCTIONS: _______________________________________________________________________________________________ _____________________________________________________________________________________________________________________________________ °_____________________________________________________________________________________________________________________________________ °_____________________________________________________________________________________________________________________________________ ° °WHEN TO CALL YOUR DOCTOR: °1. Fever over 101.0 °2. Nausea and/or vomiting. °3. Extreme swelling or bruising. °4. Continued bleeding from incision. °5. Increased pain, redness, or drainage from the incision. ° °The clinic staff is available to answer your questions during regular business hours.  Please don’t hesitate to call and ask to speak to one of the nurses for clinical concerns.  If you have a medical emergency, go to the nearest  emergency room or call 911.  A surgeon from [facility] is always on call at the hospital. ° °For further questions, please visit [email or web address]  ° ° °  Post Anesthesia Home Care Instructions  Activity: Get plenty of rest for the remainder of the day. A responsible individual must stay with you for 24 hours following the procedure.  For the next 24 hours, DO NOT: -Drive a car -Paediatric nurse -Drink alcoholic beverages -Take any medication unless instructed by your physician -Make any legal decisions or sign important papers.  Meals: Start with liquid foods such as gelatin or soup. Progress to regular foods as tolerated. Avoid greasy, spicy, heavy foods. If nausea and/or vomiting occur, drink only clear liquids until the nausea and/or vomiting subsides. Call your physician if vomiting continues.  Special Instructions/Symptoms: Your throat may feel dry or sore from the anesthesia or the breathing tube placed in your throat during surgery. If this causes discomfort, gargle with warm salt water. The discomfort should disappear within 24 hours.  If you had a scopolamine patch placed behind your ear for the management of post- operative nausea and/or vomiting:  1. The medication in the patch is effective for 72 hours, after which it should be removed.  Wrap patch in a tissue and discard in the trash. Wash hands thoroughly with soap and water. 2. You may remove the patch earlier than 72 hours if you experience unpleasant side effects which may include dry mouth, dizziness or visual disturbances. 3. Avoid touching the patch. Wash your hands with soap and water after contact with the patch.    No Tylenol until 6:40PM.

## 2020-12-16 NOTE — Anesthesia Preprocedure Evaluation (Signed)
Anesthesia Evaluation  Patient identified by MRN, date of birth, ID band Patient awake    Reviewed: Allergy & Precautions, NPO status , Patient's Chart, lab work & pertinent test results, reviewed documented beta blocker date and time   Airway Mallampati: II  TM Distance: >3 FB Neck ROM: Full    Dental no notable dental hx. (+) Teeth Intact, Caps   Pulmonary former smoker,    Pulmonary exam normal breath sounds clear to auscultation       Cardiovascular hypertension, Pt. on medications and Pt. 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  negative genitourinary   Musculoskeletal  (+) Arthritis , Osteoarthritis,    Abdominal (+) + obese,   Peds  Hematology negative hematology ROS (+)   Anesthesia Other Findings   Reproductive/Obstetrics                             Anesthesia Physical  Anesthesia Plan  ASA: II  Anesthesia Plan: General   Post-op Pain Management:    Induction: Intravenous  PONV Risk Score and Plan: 3 and Ondansetron, Dexamethasone, Midazolam and Treatment may vary due to age or medical condition  Airway Management Planned: LMA  Additional Equipment:   Intra-op Plan:   Post-operative Plan: Extubation in OR  Informed Consent: I have reviewed the patients History and Physical, chart, labs and discussed the procedure including the risks, benefits and alternatives for the proposed anesthesia with the patient or authorized representative who has indicated his/her understanding and acceptance.     Dental advisory given  Plan Discussed with: CRNA,  Anesthesiologist and Surgeon  Anesthesia Plan Comments:         Anesthesia Quick Evaluation

## 2020-12-16 NOTE — Op Note (Signed)
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  This was oriented with ink and sent to pathology.  Also 3 additional margins as outlined in the report.  Drains: None  Indications for procedure: The patient is a 80 year old female with right breast DCIS diagnosed by mammogram and core biopsy.  She opted for breast conserving surgery.  Risk, benefits long-term expectations of this course of action were discussed.  Additional therapy also discussed which includes radiation therapy would likely for surgery.  She was to preserve her breast therefore mastectomy which was the other option discussed with her was not her first choice.The procedure has been discussed with the patient. Alternatives to surgery have been discussed with the patient.  Risks of surgery include bleeding, cosmesis,  Infection,  Seroma formation, death,  and the need for further surgery.   The patient understands and wishes to proceed.  Description of procedure: The patient was met in the holding area and questions were answered.  Neoprobe used to verify the location of all 3 seeds and films were available for review.  Right side was marked as correct.  All questions were answered.  She was taken back to the operating room.  She is placed supine upon the OR table.  After induction of general esthesia, the right breast was prepped and draped in sterile fashion timeout performed.  She received appropriate antibiotics.  Neoprobe used and all 3 seeds were localized right breast upper outer quadrant.  Curvilinear incision was made over the right breast upper outer quadrant.  Dissection was carried to encompass all 3 seeds.   Of note the more superior seed was dislodged and removed separately.  This triangular piece of tissue was removed and oriented.  I then took an anterior superior margin, medial posterior margin and inferolateral margin separately.  The Faxitron revealed the clip to be present in 2-3 seeds.  Third seed was visualized in a separate container.  The deep layers of the breast were irrigated.  Local anesthetic infiltrated.  The deep tissue to be mobilized to close this defect.  This was done with cautery.  We then closed the deep layers with 3-0 Vicryl.  4-0 Monocryl is used to close the skin in a subcuticular fashion.  Dermabond applied.  All counts were found to be correct.  Breast binder placed.  The patient was awoke extubated taken to recovery in satisfactory condition.

## 2020-12-16 NOTE — Anesthesia Procedure Notes (Signed)
Procedure Name: LMA Insertion Date/Time: 12/16/2020 1:01 PM Performed by: Maryella Shivers, CRNA Pre-anesthesia Checklist: Patient identified, Emergency Drugs available, Suction available and Patient being monitored Patient Re-evaluated:Patient Re-evaluated prior to induction Oxygen Delivery Method: Circle system utilized Preoxygenation: Pre-oxygenation with 100% oxygen Induction Type: IV induction Ventilation: Mask ventilation without difficulty LMA: LMA inserted LMA Size: 4.0 Number of attempts: 1 Airway Equipment and Method: Bite block Placement Confirmation: positive ETCO2 Tube secured with: Tape Dental Injury: Teeth and Oropharynx as per pre-operative assessment

## 2020-12-16 NOTE — Transfer of Care (Signed)
Immediate Anesthesia Transfer of Care Note  Patient: Kristy [name]  Procedure(s) Performed: RADIOACTIVE SEED GUIDED TIMES 3 RIGHT BREAST LUMPECTOMY (Right Breast)  Patient Location: PACU  Anesthesia Type:General  Level of Consciousness: sedated  Airway & Oxygen Therapy: Patient Spontanous Breathing and Patient connected to face mask oxygen  Post-op Assessment: Report given to RN and Post -op Vital signs reviewed and stable  Post vital signs: Reviewed and stable  Last Vitals:  Vitals Value Taken Time  BP 114/64 12/16/20 1417  Temp    Pulse 61 12/16/20 1419  Resp 21 12/16/20 1419  SpO2 100 % 12/16/20 1419  Vitals shown include unvalidated device data.  Last Pain:  Vitals:   12/16/20 1233  TempSrc: Oral  PainSc: 0-No pain         Complications: No complications documented.

## 2020-12-17 ENCOUNTER — Encounter (HOSPITAL_BASED_OUTPATIENT_CLINIC_OR_DEPARTMENT_OTHER): Payer: Self-pay | Admitting: Surgery

## 2020-12-20 ENCOUNTER — Telehealth: Payer: Self-pay | Admitting: *Deleted

## 2020-12-20 NOTE — Telephone Encounter (Signed)
Patient left a voicemail stating that she has a cancer policy and needs a form filled out and sign. Patient stated that it has some questions and needs her doctor's signature. Patient stated that this is related to the recent procedures that she has had done. Patient wants to know if Dr. Diona Browner will complete the form for her.

## 2020-12-21 LAB — SURGICAL PATHOLOGY

## 2020-12-21 NOTE — Telephone Encounter (Signed)
I can fill it out for her.

## 2020-12-21 NOTE — Telephone Encounter (Signed)
Kristy [name] notified as instructed by telephone.  She will drop off form tomorrow.

## 2020-12-22 ENCOUNTER — Telehealth: Payer: Self-pay

## 2020-12-22 NOTE — Telephone Encounter (Signed)
Patient came and dropped off forms to be completed (insurance claim forms) and would like a phone call when ready for pick up    Please advise

## 2020-12-22 NOTE — Progress Notes (Signed)
[place]  [phone] [phone]     ID: Kristy [name] DOB: Jan 11, 1941  MR#: [id number]  JQG#:[id number]  Patient Care Team: Jinny Sanders, MD as PCP - General [name], MD as Consulting Physician (General Surgery) Khalel Alms, Kristy Dad, MD as Consulting Physician (Oncology) Kyung Rudd, MD as Consulting Physician (Radiation Oncology) Ladene Artist, MD as Consulting Physician (Gastroenterology) Chauncey Cruel, MD OTHER MD:  CHIEF COMPLAINT: noninvasive breast cancer, estrogen receptor positive  CURRENT TREATMENT: Awaiting further local therapy   HISTORY OF CURRENT ILLNESS: Kristy [name] has a history of right breast lumpectomy on 08/14/2017 under Dr. Brantley Stage for a tubular adenoma with calcifications.  More recently, she had routine screening mammography on 10/11/2020 showing a possible abnormality in the right breast. She underwent right diagnostic mammography with tomography at [facility] on 10/29/2020 showing: breast density category C; indeterminate 1.1 cm group of fine pleomorphic calcifications in lower-outer right breast.  Accordingly on 11/09/2020 she proceeded to biopsy of the right breast area in question. The pathology from this procedure ([id number]) showed: ductal carcinoma in situ, high grade, with necrosis and calcifications; complex sclerosing lesion. Prognostic indicators significant for: estrogen receptor, 95% positive with strong staining intensity and progesterone receptor, 0% negative.   She underwent breast MRI on 11/15/2020 showing: breast composition C; enhancement around a small hematoma and biopsy tract in lower-outer right breast consistent with patient's known malignancy; indeterminate 6 mm enhancing mass with suspicious kinetics, just superior and posterior to biopsy site in lower-outer right breast; indeterminate 1.8 cm area of linear non-mass enhancement in superior central right breast; no  evidence of malignancy in left breast or bilateral lymph nodes.  She proceeded to additional lower-outer right breast biopsy on 11/23/2020. Pathology [id number]) again showed ductal carcinoma in situ, high grade, with necrosis and calcifications. Prognostic panel showing: estrogen receptor 100% positive with strong staining intensity; progesterone receptor 0% negative.  Two additional right breast biopsies were obtained on 11/29/2020. Pathology [id number]) showed: 1. Right Breast, upper central  - ductal carcinoma in situ with necrosis and calcifications 2. 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 Brantley Stage. Pathology from the procedure ([id number]) showed: multifocal intermediate to high grade ductal carcinoma in situ; carcinoma present at cauterized inferior margin; complex sclerosing lesion.  Cancer Staging Ductal carcinoma in situ (DCIS) of right breast Staging form: Breast, AJCC 7th Edition - Pathologic: Stage 0 (Tis (DCIS), N0, cM0) - Signed by Chauncey Cruel, MD on 12/27/2020 Histologic grade (G): G3 Estrogen receptor status: Positive Progesterone receptor status: Negative   The patient's subsequent history is as detailed below.   INTERVAL HISTORY: Kristy [name] was evaluated in the breast cancer clinic on 12/27/2020 .  REVIEW OF SYSTEMS: There were no specific symptoms leading to the original mammogram, which was routinely scheduled. The patient denies unusual headaches, visual changes, nausea, vomiting, stiff neck, dizziness, or gait imbalance. There has been no cough, phlegm production, or pleurisy, no chest pain or pressure, and no change in bowel or bladder habits. The patient denies fever, rash, bleeding, unexplained fatigue or unexplained weight loss.  She exercises  chiefly by doing the elliptical and walking at least 5000 steps a day.  A detailed review of systems was otherwise entirely negative.   COVID 19 VACCINATION STATUS: fully vaccinated [employer]), with booster  08/2020   PAST MEDICAL HISTORY: Past Medical History:  Diagnosis Date  . Allergy   . 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 Hulda Humphrey of vascular surgery in [place]  . Arthritis    rt knee, foot  . Breast cancer ([place]) 11/29/2020   right breast DCIS  . Cataract    forming right eye   . 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 Constipation    uses stool softener PRN- uses once a week to once every 2 weeks   . Fibromuscular dysplasia ([place])   . GERD (gastroesophageal reflux disease)   . Hyperlipidemia   . Hypertension    For 16 years; urinary catecholamines within normal limits 12/09; renal Doppler ultrasound showed no evidence for renal artery stenosis  . Normal echocardiogram 4/10   LVEF 65%; no regional wall motion abnormalities; normal RV size and function; pulmonic valve had increased gradient across w/ peak gradient of about 36 mmHg (range of moderate pulmonic stenosis) followup showed normal valve  . Papilloma of breast    right  . Tinnitus of left ear     PAST SURGICAL HISTORY: Past Surgical History:  Procedure Laterality Date  . ANGIOPLASTY     2012  . BREAST BIOPSY Right 2006   benign  . BREAST EXCISIONAL BIOPSY Right 07/2017   benign  . BREAST LUMPECTOMY WITH RADIOACTIVE SEED LOCALIZATION Right 08/14/2017   Procedure: RIGHT BREAST LUMPECTOMY WITH RADIOACTIVE SEED LOCALIZATION;  Surgeon: [name], MD;  Location: [facility];  Service: General;  Laterality: Right;  . BREAST LUMPECTOMY WITH RADIOACTIVE SEED LOCALIZATION Right 12/16/2020   Procedure: RADIOACTIVE SEED GUIDED TIMES 3 RIGHT BREAST LUMPECTOMY;  Surgeon: [name], MD;  Location:  [facility];  Service: General;  Laterality: Right;  . Cardiolyte  11/07   Neg  . COLONOSCOPY    . KNEE CARTILAGE SURGERY     right knee  . POLYPECTOMY    . TOTAL ABDOMINAL HYSTERECTOMY     no cervix  . UPPER GASTROINTESTINAL ENDOSCOPY      FAMILY HISTORY: Family History  Problem Relation Age of Onset  . Prostate cancer Father   . Lung cancer Father   . Colon cancer Maternal Uncle   . Colon cancer Paternal Uncle   . Colon polyps Sister   . Coronary artery disease Paternal Grandmother   . Goiter Other        ?  . Esophageal cancer Neg Hx   . Stomach cancer Neg Hx   . Rectal cancer Neg Hx   . Bladder Cancer Neg Hx   . Kidney cancer Neg Hx   The patient's father died at the age of 46 from lung cancer.  The patient's mother died from amyotrophic lateral sclerosis at age 48.  The patient has 1 brother and 1 sister.  There is no other family history of cancer to her knowledge   GYNECOLOGIC HISTORY:  No LMP recorded. Patient has had a hysterectomy. Menarche: 80 years old [place] P 0 LMP at the time of hysterectomy in 1993, for fibroids contraceptive no HRT yes, about 4 years  Hysterectomy? yes BSO?  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  Her husband died from colon cancer at age 16.  At [facility] she lives with Kristy [name],  her significant other, who is a retired Administrator.  ADVANCED DIRECTIVES: Not in place; the patient is considering naming her niece Kristy [name], a retired Marine scientist, as her healthcare power of [place]: Social History   Tobacco Use  . Smoking status: Former Smoker    Quit date: 11/20/1982    Years since quitting: 38.1  . Smokeless tobacco: Never Used  . Tobacco comment: 15 pack year history  Vaping Use  . Vaping Use: Never used  Substance Use Topics  . Alcohol use: No  . Drug use: No      Colonoscopy: 04/2019 (Dr. Fuller Plan), recall 2022  PAP: Status post hysterectomy  Bone density: 08/2019, -2.0   No Known Allergies  Current Outpatient Medications  Medication Sig Dispense Refill  . atorvastatin (LIPITOR) 20 MG tablet TAKE 1 TABLET BY MOUTH  DAILY 90 tablet 0  . HYDROcodone-acetaminophen (NORCO/VICODIN) 5-325 MG tablet Take 1 tablet by mouth every 6 (six) hours as needed for moderate pain. 15 tablet 0  . metoprolol succinate (TOPROL-XL) 100 MG 24 hr tablet TAKE 1 TABLET BY MOUTH  DAILY WITH OR IMMEDIATELY  FOLLOWING A MEAL. 90 tablet 0  . Multiple Vitamin (MULTIVITAMIN WITH MINERALS) TABS tablet Take 1 tablet by mouth daily.    Marland Kitchen NIFEdipine (PROCARDIA XL/NIFEDICAL XL) 60 MG 24 hr tablet TAKE 1 TABLET BY MOUTH  DAILY 90 tablet 0  . triamterene-hydrochlorothiazide (MAXZIDE-25) 37.5-25 MG tablet Take 1 tablet by mouth daily. 90 tablet 2  . valACYclovir (VALTREX) 500 MG tablet Take 1 tablet (500 mg total) by mouth daily. Take daily for suppressive therapy (Patient not taking: No sig reported) 30 tablet 11   No current facility-administered medications for this visit.    OBJECTIVE: African-American woman who appears younger than stated age  45:   12/27/20 1604  BP: 136/61  Pulse: 79  Resp: 18  Temp: 97.7 F (36.5 C)  SpO2: 97%     Body mass index is 32.01 kg/m.   Wt Readings from Last 3 Encounters:  12/27/20 175 lb (79.4 kg)  12/16/20 175 lb 0.7 oz (79.4 kg)  12/09/20 177 lb (80.3 kg)      ECOG FS:1 - Symptomatic but completely ambulatory  Ocular: Sclerae unicteric, pupils round and equal Ear-nose-throat: Wearing a mask Lymphatic: No cervical or supraclavicular adenopathy Lungs no rales or rhonchi Heart regular rate and rhythm Abd soft, nontender, positive bowel sounds MSK no focal spinal tenderness, no joint edema Neuro: non-focal, well-oriented, appropriate affect Breasts: The right breast is status post recent surgery.  The cosmetic result is very  good.  There is a moderate ecchymosis.  There is no dehiscence, erythema, or swelling.  The left breast is benign.  Both axillae are benign  LAB RESULTS:  CMP     Component Value Date/Time   NA 138 12/13/2020 1231   K 4.1 12/13/2020 1231   CL 102 12/13/2020 1231   CO2 24 12/13/2020 1231   GLUCOSE 115 (H) 12/13/2020 1231   BUN 12 12/13/2020 1231   CREATININE 0.85 12/13/2020 1231   CALCIUM 10.1 12/13/2020 1231   PROT 7.2 12/13/2020 1231   ALBUMIN 3.8 12/13/2020 1231   AST 19 12/13/2020 1231   ALT 17 12/13/2020 1231   ALKPHOS 68 12/13/2020 1231   BILITOT 0.9 12/13/2020 1231   GFRNONAA >60 12/13/2020 1231   GFRAA >60 08/09/2017 1300    No results found for: TOTALPROTELP, ALBUMINELP, A1GS, A2GS, BETS, BETA2SER, GAMS, MSPIKE, SPEI  Lab Results  Component Value  Date   WBC 8.0 12/13/2020   NEUTROABS 3.4 12/13/2020   HGB 13.6 12/13/2020   HCT 41.1 12/13/2020   MCV 96.5 12/13/2020   PLT 359 12/13/2020    No results found for: LABCA2  No components found for: CHYIFO277  No results for input(s): INR in the last 168 hours.  No results found for: LABCA2  No results found for: AJO878  No results found for: MVE720  No results found for: NOB096  No results found for: CA2729  No components found for: HGQUANT  No results found for: CEA1 / No results found for: CEA1   No results found for: AFPTUMOR  No results found for: CHROMOGRNA  No results found for: KPAFRELGTCHN, LAMBDASER, KAPLAMBRATIO (kappa/lambda light chains)  No results found for: HGBA, HGBA2QUANT, HGBFQUANT, HGBSQUAN (Hemoglobinopathy evaluation)   No results found for: LDH  No results found for: IRON, TIBC, IRONPCTSAT (Iron and TIBC)  No results found for: FERRITIN  Urinalysis    Component Value Date/Time   COLORURINE STRAW (A) 03/02/2017 1320   APPEARANCEUR Clear 08/05/2019 1555   LABSPEC 1.004 (L) 03/02/2017 1320   PHURINE 5.0 03/02/2017 1320   GLUCOSEU Negative 08/05/2019 1555   HGBUR  MODERATE (A) 03/02/2017 1320   HGBUR large 11/03/2010 1548   BILIRUBINUR Negative 09/28/2020 1247   BILIRUBINUR Negative 08/05/2019 1555   KETONESUR NEGATIVE 03/02/2017 1320   PROTEINUR Negative 09/28/2020 1247   PROTEINUR Negative 08/05/2019 1555   PROTEINUR NEGATIVE 03/02/2017 1320   UROBILINOGEN 0.2 09/28/2020 1247   UROBILINOGEN 0.2 06/10/2011 1040   NITRITE Negative 09/28/2020 1247   NITRITE Negative 08/05/2019 1555   NITRITE NEGATIVE 03/02/2017 1320   LEUKOCYTESUR Negative 09/28/2020 1247   LEUKOCYTESUR Negative 08/05/2019 1555     STUDIES: MM Breast Surgical Specimen  Result Date: 12/16/2020 CLINICAL DATA:  Status post the localized RIGHT lumpectomy. EXAM: SPECIMEN RADIOGRAPH OF THE RIGHT BREAST COMPARISON:  Previous exam(s). FINDINGS: Status post excision of the right breast. Single specimen is submitted. Radioactive seed is seen adjacent to X shaped clip. A second radioactive seed is identified adjacent to the cylinder-shaped clip. There is also been excision of a barbell shaped and coil shaped LEFT, both within the same specimen. A third radioactive seed is identified within the specimen container. Findings are discussed with the operating room nurse at the time of interpretation. IMPRESSION: Specimen radiograph of the right breast. Electronically Signed   By: Nolon Nations M.D.   On: 12/16/2020 13:49   MM RT RADIOACTIVE SEED LOC MAMMO GUIDE  Result Date: 12/16/2020 CLINICAL DATA:  80 year old female with recently diagnosed right breast DCIS 3 sites in the lower outer right breast, at site of X, coil and cylindrical shaped biopsy marking clip. In addition, DCIS diagnosed in the upper central right breast at site of dumbbell shaped biopsy marking clip. Patient presents for preoperative radioactive seed placement, with bracketing of the DCIS in the lower outer right breast and radioactive seed placement at the site of the diagnosed DCIS in the upper central right breast. EXAM:  MAMMOGRAPHIC GUIDED RADIOACTIVE SEED LOCALIZATION OF THE RIGHT BREAST COMPARISON:  Previous exam(s). FINDINGS: Patient presents for radioactive seed localization prior to right breast lumpectomy. I met with the patient and we discussed the procedure of seed localization including benefits and alternatives. We discussed the high likelihood of a successful procedure. We discussed the risks of the procedure including infection, bleeding, tissue injury and further surgery. We discussed the low dose of radioactivity involved in the procedure. Informed, written consent  was given. The usual time-out protocol was performed immediately prior to the procedure. SITE 1: RIGHT BREAST LOWER OUTER X SHAPED BIOPSY MARKING CLIP: Using mammographic guidance, sterile technique, 1% lidocaine and an I-125 radioactive seed, the X shaped biopsy marking clip was localized using a lateral to medial approach. The follow-up mammogram images confirm the seed in the expected location and were marked for Dr. Brantley Stage. Follow-up survey of the patient confirms presence of the radioactive seed. Order number of I-125 seed:  [id number]. Total activity:  9.767 millicuries reference Date: 11/23/2020 SITE 2: RIGHT BREAST LOWER OUTER CYLINDRICAL SHAPED BIOPSY MARKING CLIP: Using mammographic guidance, sterile technique, 1% lidocaine and an I-125 radioactive seed, the cylindrical shaped biopsy marking clip in the lower outer right breast was localized using a lateral to medial approach. The follow-up mammogram images confirm the seed in the expected location and were marked for Dr. Brantley Stage. Follow-up survey of the patient confirms presence of the radioactive seed. Order number of I-125 seed:  [id number]. Total activity:  4.097 millicuries reference Date: 11/23/2020 SITE 3: RIGHT BREAST UPPER CENTRAL DUMBBELL SHAPED BIOPSY MARKING CLIP: Using mammographic guidance, sterile technique, 1% lidocaine and an I-125 radioactive seed, the dumbbell shaped biopsy  marking clip was localized using a lateral to medial approach. The follow-up mammogram images confirm the seed in the expected location and were marked for Dr. Brantley Stage. Follow-up survey of the patient confirms presence of the radioactive seed. Order number of I-125 seed:  [id number]. Total activity:  6.834 millicuries reference Date: 11/23/2020 The patient tolerated the procedure well and was released from the [facility]. She was given instructions regarding seed removal. IMPRESSION: Radioactive seed localization right breast. No apparent complications. Electronically Signed   By: Everlean Alstrom M.D.   On: 12/16/2020 12:12   MM RT RADIO SEED EA ADD LESION LOC MAMMO  Result Date: 12/16/2020 CLINICAL DATA:  80 year old female with recently diagnosed right breast DCIS 3 sites in the lower outer right breast, at site of X, coil and cylindrical shaped biopsy marking clip. In addition, DCIS diagnosed in the upper central right breast at site of dumbbell shaped biopsy marking clip. Patient presents for preoperative radioactive seed placement, with bracketing of the DCIS in the lower outer right breast and radioactive seed placement at the site of the diagnosed DCIS in the upper central right breast. EXAM: MAMMOGRAPHIC GUIDED RADIOACTIVE SEED LOCALIZATION OF THE RIGHT BREAST COMPARISON:  Previous exam(s). FINDINGS: Patient presents for radioactive seed localization prior to right breast lumpectomy. I met with the patient and we discussed the procedure of seed localization including benefits and alternatives. We discussed the high likelihood of a successful procedure. We discussed the risks of the procedure including infection, bleeding, tissue injury and further surgery. We discussed the low dose of radioactivity involved in the procedure. Informed, written consent was given. The usual time-out protocol was performed immediately prior to the procedure. SITE 1: RIGHT BREAST LOWER OUTER X SHAPED BIOPSY MARKING CLIP:  Using mammographic guidance, sterile technique, 1% lidocaine and an I-125 radioactive seed, the X shaped biopsy marking clip was localized using a lateral to medial approach. The follow-up mammogram images confirm the seed in the expected location and were marked for Dr. Brantley Stage. Follow-up survey of the patient confirms presence of the radioactive seed. Order number of I-125 seed:  [id number]. Total activity:  8.921 millicuries reference Date: 11/23/2020 SITE 2: RIGHT BREAST LOWER OUTER CYLINDRICAL SHAPED BIOPSY MARKING CLIP: Using mammographic guidance, sterile technique, 1% lidocaine and an I-125 radioactive seed, the  cylindrical shaped biopsy marking clip in the lower outer right breast was localized using a lateral to medial approach. The follow-up mammogram images confirm the seed in the expected location and were marked for Dr. Brantley Stage. Follow-up survey of the patient confirms presence of the radioactive seed. Order number of I-125 seed:  [id number]. Total activity:  8.938 millicuries reference Date: 11/23/2020 SITE 3: RIGHT BREAST UPPER CENTRAL DUMBBELL SHAPED BIOPSY MARKING CLIP: Using mammographic guidance, sterile technique, 1% lidocaine and an I-125 radioactive seed, the dumbbell shaped biopsy marking clip was localized using a lateral to medial approach. The follow-up mammogram images confirm the seed in the expected location and were marked for Dr. Brantley Stage. Follow-up survey of the patient confirms presence of the radioactive seed. Order number of I-125 seed:  [id number]. Total activity:  8.527 millicuries reference Date: 11/23/2020 The patient tolerated the procedure well and was released from the [place]. She was given instructions regarding seed removal. IMPRESSION: Radioactive seed localization right breast. No apparent complications. Electronically Signed   By: Everlean Alstrom M.D.   On: 12/16/2020 12:12   MM RT RADIO SEED EA ADD LESION LOC MAMMO  Result Date: 12/16/2020 CLINICAL DATA:   80 year old female with recently diagnosed right breast DCIS 3 sites in the lower outer right breast, at site of X, coil and cylindrical shaped biopsy marking clip. In addition, DCIS diagnosed in the upper central right breast at site of dumbbell shaped biopsy marking clip. Patient presents for preoperative radioactive seed placement, with bracketing of the DCIS in the lower outer right breast and radioactive seed placement at the site of the diagnosed DCIS in the upper central right breast. EXAM: MAMMOGRAPHIC GUIDED RADIOACTIVE SEED LOCALIZATION OF THE RIGHT BREAST COMPARISON:  Previous exam(s). FINDINGS: Patient presents for radioactive seed localization prior to right breast lumpectomy. I met with the patient and we discussed the procedure of seed localization including benefits and alternatives. We discussed the high likelihood of a successful procedure. We discussed the risks of the procedure including infection, bleeding, tissue injury and further surgery. We discussed the low dose of radioactivity involved in the procedure. Informed, written consent was given. The usual time-out protocol was performed immediately prior to the procedure. SITE 1: RIGHT BREAST LOWER OUTER X SHAPED BIOPSY MARKING CLIP: Using mammographic guidance, sterile technique, 1% lidocaine and an I-125 radioactive seed, the X shaped biopsy marking clip was localized using a lateral to medial approach. The follow-up mammogram images confirm the seed in the expected location and were marked for Dr. Brantley Stage. Follow-up survey of the patient confirms presence of the radioactive seed. Order number of I-125 seed:  [id number]. Total activity:  1.443 millicuries reference Date: 11/23/2020 SITE 2: RIGHT BREAST LOWER OUTER CYLINDRICAL SHAPED BIOPSY MARKING CLIP: Using mammographic guidance, sterile technique, 1% lidocaine and an I-125 radioactive seed, the cylindrical shaped biopsy marking clip in the lower outer right breast was localized using a  lateral to medial approach. The follow-up mammogram images confirm the seed in the expected location and were marked for Dr. Brantley Stage. Follow-up survey of the patient confirms presence of the radioactive seed. Order number of I-125 seed:  [id number]. Total activity:  1.950 millicuries reference Date: 11/23/2020 SITE 3: RIGHT BREAST UPPER CENTRAL DUMBBELL SHAPED BIOPSY MARKING CLIP: Using mammographic guidance, sterile technique, 1% lidocaine and an I-125 radioactive seed, the dumbbell shaped biopsy marking clip was localized using a lateral to medial approach. The follow-up mammogram images confirm the seed in the expected location and were marked for Dr. Brantley Stage. Follow-up  survey of the patient confirms presence of the radioactive seed. Order number of I-125 seed:  [id number]. Total activity:  0.737 millicuries reference Date: 11/23/2020 The patient tolerated the procedure well and was released from the [facility]. She was given instructions regarding seed removal. IMPRESSION: Radioactive seed localization right breast. No apparent complications. Electronically Signed   By: Everlean Alstrom M.D.   On: 12/16/2020 12:12   MM CLIP PLACEMENT RIGHT  Result Date: 11/29/2020 CLINICAL DATA:  Evaluate BARBELL and CYLINDER clip following MR guided RIGHT breast biopsies. EXAM: DIAGNOSTIC RIGHT MAMMOGRAM POST MRI BIOPSY COMPARISON:  Previous exam(s). FINDINGS: Mammographic images were obtained following MR guided biopsies of UPPER RIGHT breast non masslike enhancement (BARBELL clip) and 0.6 cm UPPER-OUTER RIGHT breast mass (CYLINDER clip). The BARBELL biopsy marking clip is in expected position at the site of biopsy of the 1.8 cm UPPER RIGHT breast non masslike enhancement. The CYLINDER biopsy marking clip is in expected position at the site of biopsy of the 0.6 cm UPPER-OUTER RIGHT breast mass. IMPRESSION: Appropriate positioning of the BARBELL shaped biopsy marking clip at the site of biopsy in the UPPER RIGHT breast.  Appropriate positioning of the CYLINDER biopsy marking clip at the site of biopsy in the UPPER OUTER RIGHT breast. Final Assessment: Post Procedure Mammograms for Marker Placement Electronically Signed   By: Margarette Canada M.D.   On: 11/29/2020 11:05   MR RT BREAST BX W LOC DEV 1ST LESION IMAGE BX SPEC MR GUIDE  Addendum Date: 12/01/2020   ADDENDUM REPORT: 11/30/2020 14:18 ADDENDUM: Pathology revealed DUCTAL CARCINOMA IN SITU WITH NECROSIS AND CALCIFICATIONS of the RIGHT breast, upper central. This was found to be concordant by Dr. Hassan Rowan. 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 This was found to be concordant by Dr. Hassan Rowan. Pathology results were discussed with the patient by telephone. The patient reported doing well after the biopsies with tenderness at the sites. Post biopsy instructions and care were reviewed and questions were answered. The patient was encouraged to call [facility] for any additional concerns. The patient has a recent diagnosis of right breast cancer and should follow her outlined treatment plan. Pathology results reported by Stacie Acres RN on 11/30/2020. Electronically Signed   By: Margarette Canada M.D.   On: 11/30/2020 14:18   Result Date: 12/01/2020 CLINICAL DATA:  80 year old female for tissue sampling of 1.8 cm UPPER central RIGHT breast non masslike enhancement and tissue sampling of 0.6 cm slightly LOWER OUTER RIGHT breast mass. Recent diagnosis of RIGHT breast cancer/DCIS. EXAM: MRI GUIDED CORE NEEDLE BIOPSY OF THE RIGHT BREAST x 2 TECHNIQUE: Multiplanar, multisequence MR imaging of the RIGHT breast was performed both before and after administration of intravenous contrast. CONTRAST:  76m GADAVIST GADOBUTROL 1 MMOL/ML IV SOLN COMPARISON:  Previous exams. FINDINGS: I met with the patient, and  we discussed the procedure of MRI guided biopsy, including risks, benefits, and alternatives. Specifically, we discussed the risks of infection, bleeding, tissue injury, clip migration, and inadequate sampling. Informed, written consent was given. The usual time out protocol was performed immediately prior to the procedure. MRI GUIDED CORE NEEDLE BIOPSY OF THE RIGHT BREAST #1 (1.8 cm UPPER central non masslike enhancement - BARBELL clip): Using sterile technique, 1% Lidocaine, MRI guidance, and a 9 gauge vacuum assisted device, biopsy was performed of the 1.8 cm area of non masslike  enhancement within the UPPER central RIGHT breast using a LATERAL approach. At the conclusion of the procedure, a BARBELL tissue marker clip was deployed into the biopsy cavity. Follow-up 2-view mammogram was performed and dictated separately. MRI GUIDED CORE NEEDLE BIOPSY OF THE RIGHT BREAST #2 (0.6 cm UPPER-OUTER RIGHT breast mass - CYLINDER clip): Using sterile technique, 1% Lidocaine, MRI guidance, and a 9 gauge vacuum assisted device, biopsy was performed of the 0.6 cm mass in the slightly UPPER OUTER RIGHT breast using a LATERAL approach. At the conclusion of the procedure, a CYLINDER tissue marker clip was deployed into the biopsy cavity. Follow-up 2-view mammogram was performed and dictated separately. IMPRESSION: MRI guided biopsies of the 1.8 cm UPPER central non masslike enhancement (BARBELL clip) and the 0.6 cm UPPER-OUTER RIGHT breast mass (CYLINDER clip). Electronically Signed: By: Margarette Canada M.D. 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 This was found to be concordant by Dr. Hassan Rowan. 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 This was found to be concordant by Dr. Hassan Rowan. Pathology results were discussed with the patient by telephone. The patient reported doing well after the biopsies with tenderness at the sites. Post biopsy instructions and care were reviewed and questions were answered. The patient was encouraged to call [facility] for any additional concerns. The patient has a recent diagnosis of right breast cancer and should follow her outlined treatment plan. Pathology results reported by Stacie Acres RN on 11/30/2020. Electronically Signed   By: Margarette Canada M.D.   On: 11/30/2020 14:18   Result Date: 12/01/2020 CLINICAL DATA:  80 year old female for tissue sampling of 1.8 cm UPPER central RIGHT breast non masslike enhancement and tissue sampling of 0.6 cm slightly LOWER OUTER RIGHT breast mass. Recent diagnosis of RIGHT breast cancer/DCIS. EXAM: MRI GUIDED CORE NEEDLE BIOPSY OF THE RIGHT BREAST x 2 TECHNIQUE: Multiplanar, multisequence MR imaging of the RIGHT breast was performed both before and after administration of intravenous contrast. CONTRAST:  38m GADAVIST GADOBUTROL 1 MMOL/ML IV SOLN COMPARISON:  Previous exams. FINDINGS: I met with the patient, and we discussed the procedure of MRI guided biopsy, including risks, benefits, and alternatives. Specifically, we discussed the risks of infection, bleeding, tissue injury, clip migration, and inadequate sampling. Informed, written consent was given. The usual time out protocol was performed immediately prior to the procedure. MRI GUIDED CORE NEEDLE BIOPSY OF THE RIGHT BREAST #1 (1.8 cm UPPER central non masslike enhancement - BARBELL clip): Using sterile technique, 1% Lidocaine, MRI guidance, and a 9 gauge vacuum assisted device, biopsy was performed of the 1.8 cm area of non masslike enhancement within the UPPER central RIGHT breast using a  LATERAL approach. At the conclusion of the procedure, a BARBELL tissue marker clip was deployed into the biopsy cavity. Follow-up 2-view mammogram was performed and dictated separately. MRI GUIDED CORE NEEDLE BIOPSY OF THE RIGHT BREAST #2 (0.6 cm UPPER-OUTER RIGHT breast mass - CYLINDER clip): Using sterile technique, 1% Lidocaine, MRI guidance, and a 9 gauge vacuum assisted device, biopsy was performed of the 0.6 cm mass in the slightly UPPER OUTER RIGHT breast using a LATERAL approach.  At the conclusion of the procedure, a CYLINDER tissue marker clip was deployed into the biopsy cavity. Follow-up 2-view mammogram was performed and dictated separately. IMPRESSION: MRI guided biopsies of the 1.8 cm UPPER central non masslike enhancement (BARBELL clip) and the 0.6 cm UPPER-OUTER RIGHT breast mass (CYLINDER clip). Electronically Signed: By: Margarette Canada M.D. On: 11/29/2020 10:20     ELIGIBLE FOR AVAILABLE RESEARCH PROTOCOL:   ASSESSMENT: 80 y.o. [place] woman status post right lumpectomy 12/16/2020, for a multi centric ductal carcinoma in situ, grade 3, with a positive inferior lateral margin  (a) consider additional surgery for margin clearance  (1) adjuvant radiation to follow  (2) consider tamoxifen at the completion of local treatment  (a) status post hysterectomy  (b) bone density 09/15/2019 shows a T score of -2 point  PLAN: I met today with Kristy [name] to review her new diagnosis. Specifically we discussed the biology of her breast cancer, its diagnosis, staging, treatment  options and prognosis. Kristy [name] understands that in noninvasive ductal carcinoma, also called ductal carcinoma in situ ("DCIS") the breast cancer cells remain trapped in the ducts were they started. They cannot travel to a vital organ. For that reason these cancers in themselves are not life-threatening.  If the whole breast is removed then all the ducts are removed and since the cancer cells are trapped in the ducts, the cure  rate with mastectomy for noninvasive breast cancer is approximately 99%. Nevertheless we recommended lumpectomy, because there is no survival advantage to mastectomy and because the cosmetic result is generally superior with breast conservation.  Since the patient is keeping her breasts, there will be some risk of recurrence. The recurrence can only be in the same breast since, again, the cells are trapped in the ducts. There is no connection from one breast to the other. The risk of local recurrence is cut by more than half with radiation, which is standard in this situation.  In estrogen receptor positive cancers like Kristy [name], anti-estrogens can also be considered. They will further reduce the risk of recurrence by one half. In addition anti-estrogens will lower the risk of a new breast cancer developing in either breast, also by one half. That risk otherwise approaches 1% per year.   Accordingly the overall plan is for surgery, followed by radiation, then a discussion of anti-estrogens.  The only finding ointment is the positive cauterized inferior lateral margin.  This likely will be discussed at conference this week.  The patient is seeing Dr. Brantley Stage next week and at that time a definitive decision can be made whether further surgery for margin clearance is needed or whether radiation will suffice  I plan to see Kristy [name] in about 3 months at which time we will discuss antiestrogens.  She is a particularly good candidate for tamoxifen because she is not at risk for uterine cancer, was able to take hormone replacement for 4 years with no clotting complications, and would benefit from local vaginal estrogens.  Kristy [name] has a good understanding of the overall plan. She agrees with it. She knows the goal of treatment in her case is cure. She will call with any problems that may develop before her next visit here.   Total encounter time 65 minutes.  Kristy Dad. Kaylaann Mountz, MD 12/27/2020 5:00 PM Medical  Oncology and Hematology [facility] [place], [place] [address] Tel. [phone]    Fax. [phone]   This document serves as a record of services personally performed by Lurline Del,  MD. It was created on his behalf by Wilburn Mylar, a trained medical scribe. The creation of this record is based on the scribe's personal observations and the provider's statements to them.   I, Lurline Del MD, have reviewed the above documentation for accuracy and completeness, and I agree with the above.    *Total Encounter Time as defined by the Centers for Medicare and Medicaid Services includes, in addition to the face-to-face time of a patient visit (documented in the note above) non-face-to-face time: obtaining and reviewing outside history, ordering and reviewing medications, tests or procedures, care coordination (communications with other health care professionals or caregivers) and documentation in the medical record.

## 2020-12-22 NOTE — Telephone Encounter (Signed)
Form placed in Dr. Bedsole's office in box to complete. 

## 2020-12-27 ENCOUNTER — Other Ambulatory Visit: Payer: Self-pay

## 2020-12-27 ENCOUNTER — Other Ambulatory Visit: Payer: Self-pay | Admitting: *Deleted

## 2020-12-27 ENCOUNTER — Inpatient Hospital Stay: Payer: Medicare Other | Attending: Oncology | Admitting: Oncology

## 2020-12-27 ENCOUNTER — Inpatient Hospital Stay: Payer: Medicare Other

## 2020-12-27 VITALS — BP 136/61 | HR 79 | Temp 97.7°F | Resp 18 | Ht 62.0 in | Wt 175.0 lb

## 2020-12-27 DIAGNOSIS — Z170 Estrogen receptor positive status [ER+]: Secondary | ICD-10-CM | POA: Diagnosis not present

## 2020-12-27 DIAGNOSIS — D0511 Intraductal carcinoma in situ of right breast: Secondary | ICD-10-CM

## 2020-12-27 DIAGNOSIS — M818 Other osteoporosis without current pathological fracture: Secondary | ICD-10-CM

## 2020-12-27 NOTE — Telephone Encounter (Signed)
Forms placed in Dr.Bedsole's inbox for completion.

## 2020-12-28 ENCOUNTER — Encounter: Payer: Self-pay | Admitting: *Deleted

## 2020-12-28 DIAGNOSIS — Z0279 Encounter for issue of other medical certificate: Secondary | ICD-10-CM

## 2020-12-28 NOTE — Telephone Encounter (Signed)
Kristy [name] notified by telephone that her forms are ready to be picked up at the front desk.

## 2020-12-28 NOTE — Telephone Encounter (Signed)
Patient is calling in upset that her forms have not been completed. She states that she needs them as soon as possible and they are time sensitive. Patient is requesting that they get signed as soon as possible. EM  * Explained to the patient that Dr Diona Browner has the paperwork and that she will be contacted once completed.

## 2020-12-28 NOTE — Telephone Encounter (Signed)
Forms ready for pick up

## 2020-12-29 ENCOUNTER — Telehealth: Payer: Self-pay | Admitting: Oncology

## 2020-12-29 NOTE — Telephone Encounter (Signed)
Scheduled appts per 2/7 los. Pt confirmed appt date and time.

## 2020-12-30 ENCOUNTER — Encounter: Payer: Self-pay | Admitting: *Deleted

## 2021-01-03 ENCOUNTER — Encounter: Payer: Self-pay | Admitting: *Deleted

## 2021-01-03 ENCOUNTER — Ambulatory Visit: Payer: Self-pay | Admitting: Surgery

## 2021-01-03 NOTE — H&P (View-Only) (Signed)
Kristy [name] Appointment: 01/03/2021 10:00 AM Location: [facility] Patient #: [id number] DOB: Oct 25, 1941 Widowed / Language: Kristy [name] / Race: Black or African American Female  History of Present Illness Kristy Moores A. Cornett MD; 01/03/2021 10:14 AM) Patient words: Patient returns after right breast lumpectomy for DCIS. Her inferior lateral margin was positive on the shave excision but on the initial specimen. Her gallbladder symptoms have been more active but not prolonged. She is on a bland diet.                FINAL MICROSCOPIC DIAGNOSIS:  A. BREAST, RIGHT, LUMPECTOMY: - Multifocal intermediate to high grade ductal carcinoma in situ. - See parts B-D for final margin status. - Biopsy site (x4). - Complex sclerosing lesion with usual ductal hyperplasia. - Fibroadenoma with calcifications. - See oncology table.  B. BREAST, RIGHT ADDITIONAL ANTERIO-SUPERIOR MARGIN, EXCISION: - Incidental intraductal papilloma. - Fibrocystic change and usual ductal hyperplasia. - Fat necrosis. - No malignancy identified.  C. BREAST, RIGHT ADDITIONAL MEDIAL-POSTERIOR MARGIN, EXCISION: - Incidental intraductal papilloma. - Fibroadenomatoid change with calcifications. - Usual ductal hyperplasia. - No malignancy identified.  D. BREAST, RIGHT ADDITIONAL INFERIO-LATERAL MARGIN, EXCISION: - Complex sclerosing lesion with low grade ductal carcinoma in situ. - In situ carcinoma is at the cauterized inferior margin and <1 mm from the lateral margin.  E. RADIOACTIVE SEED, RIGHT BREAST, REMOVAL: - Gross diagnosis only: Radioactive seed.  ONCOLOGY TABLE:  DCIS OF THE BREAST: Resection  Procedure: Right breast lumpectomy with additional margins Specimen Laterality: Right Histologic Type: Ductal carcinoma in situ Size of DCIS: Multifocal, DCIS involves 6 of 16 blocks (non-sequential) in part A. Focus of low grade DCIS in part D measures 6 mm. Nuclear Grade: Spectrum of  high-grade to low grade. Necrosis: Present Margins: Specify Closest Margin (required only if <65mm): In part A, DCIS is <1 mm from the original inferior and original posterior margins. The final posterior margin in part C is negative for DCIS. The additional focus of low grade DCIS in part D is at the new inferior margin and <1 mm from the new lateral margin. Regional Lymph Nodes: Not applicable (no lymph nodes submitted or found) Breast Biomarker Testing Performed on Previous Biopsy: Testing performed on Case Number: [id number], [id number] Estrogen Receptor: Positive, 100% and 95%, strong staining Progesterone Receptor: Negative Pathologic Stage Classification (pTNM, AJCC 8th Edition): pTis, pN not assigned Representative Tumor Block: A2, A11, D4 (low grade). Comment(s): There are multiple biopsy clips and multiple foci of DCIS which range from intermediate to high grade morphology. Immunohistochemistry reveals preservation of basal markers (p63, calponin, SMM) on select blocks. In part B and C, cytokeratin 5/6 and E-cadherin are positive in areas of usual ductal hyperplasia. In part D, there is preservation of basal markers (SMM, p63, calponin). In the original specimen, DCIS is <1 mm from the posterior margin multifocally and <1 mm from the inferior margin focally. The final posterior margin in part C is negative for DCIS. Thus, the final closest margins are in part D. This focus is low grade DCIS involving a complex sclerosing lesion and DCIS is present at the cauterized new inferior margin and <1 mm from the new lateral margin. Prognostic can be performed on the low grade focus (block D4) upon request.  The patient is a 80 year old female.   Allergies Kristy Forehand, CNA; 01/03/2021 9:51 AM) No Known Drug Allergies [07/09/2017]: Allergies Reconciled  Medication History Kristy Forehand, CNA; 01/03/2021 9:51 AM) Triamterene-HCTZ (37.5-25MG  Tablet, Oral)  Active. Vitamin  D (2000UNIT Tablet, Oral) Active. AmLODIPine Besylate (5MG  Tablet, Oral) Active. Metoprolol Succinate ER (100MG  Tablet ER 24HR, Oral) Active. Pantoprazole Sodium (40MG  Tablet DR, Oral) Active. Aspirin (81MG  Tablet, Oral) Active. Medications Reconciled     Physical Exam (Kristy A. Cornett MD; 01/03/2021 10:14 AM)  Breast Note: Right breast incision clean dry and intact  Abdomen Note: Soft nontender without rebound or guarding    Assessment & Plan (Kristy A. Cornett MD; 01/03/2021 10:14 AM)  BREAST NEOPLASM, TIS (DCIS), RIGHT (D05.11) Impression: needs re excision right breast lumpectomy for positive margin  Risk of lumpectomy include bleeding, infection, seroma, more surgery, use of seed/wire, wound care, cosmetic deformity and the need for other treatments, death , blood clots, death. Pt agrees to proceed.  Current Plans Pt Education - [facility] Free Text Education/Instructions: discussed with patient and provided information.

## 2021-01-03 NOTE — H&P (Signed)
[name] Appointment: 01/03/2021 10:00 AM Location: [facility] Patient #: [id number] DOB: 09/07/1941 Widowed / Language: [name] / Race: Black or African American Female  History of Present Illness [name] A. Trig Mcbryar MD; 01/03/2021 10:14 AM) Patient words: Patient returns after right breast lumpectomy for DCIS. Her inferior lateral margin was positive on the shave excision but on the initial specimen. Her gallbladder symptoms have been more active but not prolonged. She is on a bland diet.                FINAL MICROSCOPIC DIAGNOSIS:  A. BREAST, RIGHT, LUMPECTOMY: - Multifocal intermediate to high grade ductal carcinoma in situ. - See parts B-D for final margin status. - Biopsy site (x4). - Complex sclerosing lesion with usual ductal hyperplasia. - Fibroadenoma with calcifications. - See oncology table.  B. BREAST, RIGHT ADDITIONAL ANTERIO-SUPERIOR MARGIN, EXCISION: - Incidental intraductal papilloma. - Fibrocystic change and usual ductal hyperplasia. - Fat necrosis. - No malignancy identified.  C. BREAST, RIGHT ADDITIONAL MEDIAL-POSTERIOR MARGIN, EXCISION: - Incidental intraductal papilloma. - Fibroadenomatoid change with calcifications. - Usual ductal hyperplasia. - No malignancy identified.  D. BREAST, RIGHT ADDITIONAL INFERIO-LATERAL MARGIN, EXCISION: - Complex sclerosing lesion with low grade ductal carcinoma in situ. - In situ carcinoma is at the cauterized inferior margin and <1 mm from the lateral margin.  E. RADIOACTIVE SEED, RIGHT BREAST, REMOVAL: - Gross diagnosis only: Radioactive seed.  ONCOLOGY TABLE:  DCIS OF THE BREAST: Resection  Procedure: Right breast lumpectomy with additional margins Specimen Laterality: Right Histologic Type: Ductal carcinoma in situ Size of DCIS: Multifocal, DCIS involves 6 of 16 blocks (non-sequential) in part A. Focus of low grade DCIS in part D measures 6 mm. Nuclear Grade: Spectrum of  high-grade to low grade. Necrosis: Present Margins: Specify Closest Margin (required only if <73mm): In part A, DCIS is <1 mm from the original inferior and original posterior margins. The final posterior margin in part C is negative for DCIS. The additional focus of low grade DCIS in part D is at the new inferior margin and <1 mm from the new lateral margin. Regional Lymph Nodes: Not applicable (no lymph nodes submitted or found) Breast Biomarker Testing Performed on Previous Biopsy: Testing performed on Case Number: [id number], [id number] Estrogen Receptor: Positive, 100% and 95%, strong staining Progesterone Receptor: Negative Pathologic Stage Classification (pTNM, AJCC 8th Edition): pTis, pN not assigned Representative Tumor Block: A2, A11, D4 (low grade). Comment(s): There are multiple biopsy clips and multiple foci of DCIS which range from intermediate to high grade morphology. Immunohistochemistry reveals preservation of basal markers (p63, calponin, SMM) on select blocks. In part B and C, cytokeratin 5/6 and E-cadherin are positive in areas of usual ductal hyperplasia. In part D, there is preservation of basal markers (SMM, p63, calponin). In the original specimen, DCIS is <1 mm from the posterior margin multifocally and <1 mm from the inferior margin focally. The final posterior margin in part C is negative for DCIS. Thus, the final closest margins are in part D. This focus is low grade DCIS involving a complex sclerosing lesion and DCIS is present at the cauterized new inferior margin and <1 mm from the new lateral margin. Prognostic can be performed on the low grade focus (block D4) upon request.  The patient is a 80 year old female.   Allergies [name], CNA; 01/03/2021 9:51 AM) No Known Drug Allergies [07/09/2017]: Allergies Reconciled  Medication History [name], CNA; 01/03/2021 9:51 AM) Triamterene-HCTZ (37.5-25MG  Tablet, Oral)  Active. Vitamin  D (2000UNIT Tablet, Oral) Active. AmLODIPine Besylate (5MG  Tablet, Oral) Active. Metoprolol Succinate ER (100MG  Tablet ER 24HR, Oral) Active. Pantoprazole Sodium (40MG  Tablet DR, Oral) Active. Aspirin (81MG  Tablet, Oral) Active. Medications Reconciled     Physical Exam (Arav Bannister A. Deslyn Cavenaugh MD; 01/03/2021 10:14 AM)  Breast Note: Right breast incision clean dry and intact  Abdomen Note: Soft nontender without rebound or guarding    Assessment & Plan (Campbell Kray A. [place] Lapaglia MD; 01/03/2021 10:14 AM)  BREAST NEOPLASM, TIS (DCIS), RIGHT (D05.11) Impression: needs re excision right breast lumpectomy for positive margin  Risk of lumpectomy include bleeding, infection, seroma, more surgery, use of seed/wire, wound care, cosmetic deformity and the need for other treatments, death , blood clots, death. Pt agrees to proceed.  Current Plans Pt Education - [employer] Free Text Education/Instructions: discussed with patient and provided information.

## 2021-01-04 ENCOUNTER — Encounter (HOSPITAL_BASED_OUTPATIENT_CLINIC_OR_DEPARTMENT_OTHER): Payer: Self-pay | Admitting: Surgery

## 2021-01-04 ENCOUNTER — Other Ambulatory Visit: Payer: Self-pay

## 2021-01-07 ENCOUNTER — Other Ambulatory Visit (HOSPITAL_COMMUNITY)
Admission: RE | Admit: 2021-01-07 | Discharge: 2021-01-07 | Disposition: A | Discharge status: Home / Self Care | Payer: Medicare Other | Source: Ambulatory Visit | Attending: Surgery | Admitting: Surgery

## 2021-01-07 ENCOUNTER — Encounter (HOSPITAL_BASED_OUTPATIENT_CLINIC_OR_DEPARTMENT_OTHER)
Admission: RE | Admit: 2021-01-07 | Discharge: 2021-01-07 | Disposition: A | Discharge status: Home / Self Care | Payer: Medicare Other | Source: Ambulatory Visit | Attending: Surgery | Admitting: Surgery

## 2021-01-07 DIAGNOSIS — Z20822 Contact with and (suspected) exposure to covid-19: Secondary | ICD-10-CM | POA: Insufficient documentation

## 2021-01-07 DIAGNOSIS — Z01812 Encounter for preprocedural laboratory examination: Secondary | ICD-10-CM | POA: Insufficient documentation

## 2021-01-07 LAB — BASIC METABOLIC PANEL
Anion gap: 8 (ref 5–15)
BUN: 11 mg/dL (ref 8–23)
CO2: 24 mmol/L (ref 22–32)
Calcium: 9.7 mg/dL (ref 8.9–10.3)
Chloride: 105 mmol/L (ref 98–111)
Creatinine, Ser: 0.93 mg/dL (ref 0.44–1.00)
GFR, Estimated: >60 mL/min (ref >60)
Glucose, Bld: 106 mg/dL — ABNORMAL HIGH (ref 70–99)
Potassium: 4.5 mmol/L (ref 3.5–5.1)
Sodium: 137 mmol/L (ref 135–145)

## 2021-01-07 NOTE — Progress Notes (Signed)

## 2021-01-08 LAB — SARS CORONAVIRUS 2 (TAT 6-24 HRS): SARS Coronavirus 2: NEGATIVE

## 2021-01-11 ENCOUNTER — Ambulatory Visit (HOSPITAL_BASED_OUTPATIENT_CLINIC_OR_DEPARTMENT_OTHER): Payer: Medicare Other | Admitting: Anesthesiology

## 2021-01-11 ENCOUNTER — Ambulatory Visit (HOSPITAL_BASED_OUTPATIENT_CLINIC_OR_DEPARTMENT_OTHER)
Admission: RE | Admit: 2021-01-11 | Discharge: 2021-01-11 | Disposition: A | Discharge status: Home / Self Care | Payer: Medicare Other | Attending: Surgery | Admitting: Surgery

## 2021-01-11 ENCOUNTER — Encounter (HOSPITAL_BASED_OUTPATIENT_CLINIC_OR_DEPARTMENT_OTHER): Payer: Self-pay | Admitting: Surgery

## 2021-01-11 ENCOUNTER — Encounter (HOSPITAL_BASED_OUTPATIENT_CLINIC_OR_DEPARTMENT_OTHER)
Admission: RE | Disposition: A | Discharge status: Home / Self Care | Payer: Self-pay | Source: Home / Self Care | Attending: Surgery

## 2021-01-11 ENCOUNTER — Other Ambulatory Visit: Payer: Self-pay

## 2021-01-11 DIAGNOSIS — E785 Hyperlipidemia, unspecified: Secondary | ICD-10-CM | POA: Diagnosis not present

## 2021-01-11 DIAGNOSIS — Z7982 Long term (current) use of aspirin: Secondary | ICD-10-CM | POA: Insufficient documentation

## 2021-01-11 DIAGNOSIS — Y838 Other surgical procedures as the cause of abnormal reaction of the patient, or of later complication, without mention of misadventure at the time of the procedure: Secondary | ICD-10-CM | POA: Insufficient documentation

## 2021-01-11 DIAGNOSIS — Z79899 Other long term (current) drug therapy: Secondary | ICD-10-CM | POA: Insufficient documentation

## 2021-01-11 DIAGNOSIS — L7634 Postprocedural seroma of skin and subcutaneous tissue following other procedure: Secondary | ICD-10-CM | POA: Diagnosis not present

## 2021-01-11 DIAGNOSIS — M47896 Other spondylosis, lumbar region: Secondary | ICD-10-CM | POA: Diagnosis not present

## 2021-01-11 DIAGNOSIS — D0511 Intraductal carcinoma in situ of right breast: Secondary | ICD-10-CM | POA: Diagnosis not present

## 2021-01-11 DIAGNOSIS — I10 Essential (primary) hypertension: Secondary | ICD-10-CM | POA: Diagnosis not present

## 2021-01-11 HISTORY — PX: RE-EXCISION OF BREAST LUMPECTOMY: SHX6048

## 2021-01-11 SURGERY — EXCISION, LESION, BREAST
Anesthesia: General | Site: Breast | Laterality: Right

## 2021-01-11 MED ORDER — BUPIVACAINE-EPINEPHRINE 0.25% -1:200000 IJ SOLN
INTRAMUSCULAR | Status: DC | PRN
  Administered 2021-01-11: 10 mL

## 2021-01-11 MED ORDER — ACETAMINOPHEN 500 MG PO TABS: 1000.0000 mg | ORAL_TABLET | ORAL | Status: DC

## 2021-01-11 MED ORDER — CHLORHEXIDINE GLUCONATE CLOTH 2 % EX PADS: 6.0000 | MEDICATED_PAD | Freq: Once | CUTANEOUS | Status: DC

## 2021-01-11 MED ORDER — SODIUM CHLORIDE 0.9 % IV SOLN
INTRAVENOUS | Status: DC | PRN
  Administered 2021-01-11: 500 mL

## 2021-01-11 MED ORDER — GABAPENTIN 300 MG PO CAPS: 300.0000 mg | ORAL_CAPSULE | ORAL | Status: DC

## 2021-01-11 MED ORDER — CEFAZOLIN SODIUM-DEXTROSE 2-4 GM/100ML-% IV SOLN
2.0000 g | INTRAVENOUS | Status: AC
  Administered 2021-01-11: 2 g via INTRAVENOUS

## 2021-01-11 MED ORDER — CEFAZOLIN SODIUM-DEXTROSE 2-4 GM/100ML-% IV SOLN
INTRAVENOUS | Status: AC
  Filled 2021-01-11: qty 100

## 2021-01-11 MED ORDER — PROPOFOL 10 MG/ML IV BOLUS
INTRAVENOUS | Status: AC
  Filled 2021-01-11: qty 20

## 2021-01-11 MED ORDER — FENTANYL CITRATE (PF) 100 MCG/2ML IJ SOLN
INTRAMUSCULAR | Status: AC
  Filled 2021-01-11: qty 2

## 2021-01-11 MED ORDER — DEXAMETHASONE SODIUM PHOSPHATE 4 MG/ML IJ SOLN
INTRAMUSCULAR | Status: DC | PRN
  Administered 2021-01-11: 4 mg via INTRAVENOUS

## 2021-01-11 MED ORDER — FENTANYL CITRATE (PF) 100 MCG/2ML IJ SOLN
INTRAMUSCULAR | Status: DC | PRN
  Administered 2021-01-11: 50 ug via INTRAVENOUS
  Administered 2021-01-11 (×2): 25 ug via INTRAVENOUS

## 2021-01-11 MED ORDER — HYDROCODONE-ACETAMINOPHEN 5-325 MG PO TABS: 1.0000 | ORAL_TABLET | Freq: Four times a day (QID) | ORAL | 0 refills | Status: DC | PRN

## 2021-01-11 MED ORDER — LACTATED RINGERS IV SOLN: INTRAVENOUS | Status: DC

## 2021-01-11 MED ORDER — LIDOCAINE HCL (CARDIAC) PF 100 MG/5ML IV SOSY
PREFILLED_SYRINGE | INTRAVENOUS | Status: DC | PRN
  Administered 2021-01-11: 60 mg via INTRAVENOUS

## 2021-01-11 MED ORDER — PROPOFOL 10 MG/ML IV BOLUS
INTRAVENOUS | Status: DC | PRN
  Administered 2021-01-11: 170 mg via INTRAVENOUS

## 2021-01-11 MED ORDER — CEFAZOLIN SODIUM-DEXTROSE 2-4 GM/100ML-% IV SOLN: 2.0000 g | INTRAVENOUS | Status: DC

## 2021-01-11 MED ORDER — DOXYCYCLINE HYCLATE 50 MG PO CAPS: 100.0000 mg | ORAL_CAPSULE | Freq: Two times a day (BID) | ORAL | 0 refills | Status: DC

## 2021-01-11 MED ORDER — ONDANSETRON HCL 4 MG/2ML IJ SOLN
INTRAMUSCULAR | Status: AC
  Filled 2021-01-11: qty 2

## 2021-01-11 SURGICAL SUPPLY — 51 items
ADH SKN CLS APL DERMABOND .7 (GAUZE/BANDAGES/DRESSINGS) ×1
APL PRP STRL LF DISP 70% ISPRP (MISCELLANEOUS) ×1
APPLIER CLIP 9.375 MED OPEN (MISCELLANEOUS)
APR CLP MED 9.3 20 MLT OPN (MISCELLANEOUS)
BAG DECANTER FOR FLEXI CONT (MISCELLANEOUS) ×1 IMPLANT
BINDER BREAST LRG (GAUZE/BANDAGES/DRESSINGS) IMPLANT
BINDER BREAST MEDIUM (GAUZE/BANDAGES/DRESSINGS) IMPLANT
BINDER BREAST XLRG (GAUZE/BANDAGES/DRESSINGS) ×1 IMPLANT
BINDER BREAST XXLRG (GAUZE/BANDAGES/DRESSINGS) IMPLANT
BLADE SURG 15 STRL LF DISP TIS (BLADE) ×1 IMPLANT
BLADE SURG 15 STRL SS (BLADE) ×2
CANISTER SUCT 1200ML W/VALVE (MISCELLANEOUS) ×2 IMPLANT
CHLORAPREP W/TINT 26 (MISCELLANEOUS) ×2 IMPLANT
CLIP APPLIE 9.375 MED OPEN (MISCELLANEOUS) IMPLANT
COVER BACK TABLE 60X90IN (DRAPES) ×2 IMPLANT
COVER MAYO STAND STRL (DRAPES) ×2 IMPLANT
COVER WAND RF STERILE (DRAPES) IMPLANT
DECANTER SPIKE VIAL GLASS SM (MISCELLANEOUS) ×1 IMPLANT
DERMABOND ADVANCED (GAUZE/BANDAGES/DRESSINGS) ×1
DERMABOND ADVANCED .7 DNX12 (GAUZE/BANDAGES/DRESSINGS) ×1 IMPLANT
DRAPE LAPAROSCOPIC ABDOMINAL (DRAPES) IMPLANT
DRAPE LAPAROTOMY 100X72 PEDS (DRAPES) ×2 IMPLANT
DRAPE UTILITY XL STRL (DRAPES) ×2 IMPLANT
ELECT COATED BLADE 2.86 ST (ELECTRODE) ×2 IMPLANT
ELECT REM PT RETURN 9FT ADLT (ELECTROSURGICAL) ×2
ELECTRODE REM PT RTRN 9FT ADLT (ELECTROSURGICAL) ×1 IMPLANT
GLOVE ECLIPSE 8.0 STRL XLNG CF (GLOVE) ×2 IMPLANT
GLOVE SRG 8 PF TXTR STRL LF DI (GLOVE) ×1 IMPLANT
GLOVE SURG UNDER POLY LF SZ8 (GLOVE) ×2
GOWN STRL REUS W/ TWL LRG LVL3 (GOWN DISPOSABLE) ×2 IMPLANT
GOWN STRL REUS W/ TWL XL LVL3 (GOWN DISPOSABLE) ×1 IMPLANT
GOWN STRL REUS W/TWL LRG LVL3 (GOWN DISPOSABLE) ×4
GOWN STRL REUS W/TWL XL LVL3 (GOWN DISPOSABLE) ×2
HEMOSTAT ARISTA ABSORB 3G PWDR (HEMOSTASIS) ×1 IMPLANT
KIT MARKER MARGIN INK (KITS) ×1 IMPLANT
NDL HYPO 25X1 1.5 SAFETY (NEEDLE) ×1 IMPLANT
NEEDLE HYPO 25X1 1.5 SAFETY (NEEDLE) ×2 IMPLANT
NS IRRIG 1000ML POUR BTL (IV SOLUTION) ×1 IMPLANT
PACK BASIN DAY SURGERY FS (CUSTOM PROCEDURE TRAY) ×2 IMPLANT
PENCIL SMOKE EVACUATOR (MISCELLANEOUS) ×2 IMPLANT
SLEEVE SCD COMPRESS KNEE MED (MISCELLANEOUS) ×2 IMPLANT
SPONGE LAP 4X18 RFD (DISPOSABLE) ×2 IMPLANT
STAPLER VISISTAT 35W (STAPLE) IMPLANT
SUT MON AB 4-0 PC3 18 (SUTURE) ×2 IMPLANT
SUT SILK 2 0 SH (SUTURE) IMPLANT
SUT VICRYL 3-0 CR8 SH (SUTURE) ×2 IMPLANT
SYR CONTROL 10ML LL (SYRINGE) ×2 IMPLANT
TOWEL GREEN STERILE FF (TOWEL DISPOSABLE) ×4 IMPLANT
TRAY FAXITRON CT DISP (TRAY / TRAY PROCEDURE) IMPLANT
TUBE CONNECTING 20X1/4 (TUBING) ×2 IMPLANT
YANKAUER SUCT BULB TIP NO VENT (SUCTIONS) ×2 IMPLANT

## 2021-01-11 NOTE — Op Note (Signed)
Right Breast Re-excison Lumpectomy Procedure Note  Indications:  This patient returns following an initial lumpectomy.  Analysis of the pathology specimen revealed microscopic involvement of the lateral and inferior margins.  The patient now returns for re-excision.  Pre-operative Diagnosis: right breast DCIS   Post-operative Diagnosis:SAME   Surgeon: Turner Daniels  MD   Assistants: OR staff   Anesthesia: General LMA anesthesia and Local anesthesia 0.25.% bupivacaine, with epinephrine  ASA Class: 2  Procedure Details  The patient was seen in the Holding Room. The risks, benefits, complications, treatment options, and expected outcomes were discussed with the patient. The possibilities of reaction to medication, pulmonary aspiration, bleeding, infection, the need for additional procedures, failure to diagnose a condition, and creating a complication requiring transfusion or operation were discussed with the patient. The patient concurred with the proposed plan, giving informed consent.  The site of surgery properly noted/marked. The patient was taken to Operating Room # 5, identified as Kristy [name] and the procedure verified as Breast Re-excision Lumpectomy. A Time Out was held and the above information confirmed.  the patient was placed supine.  The breast was prepped and draped in standard fashion. Marcaine 0.25% with epinephrine was used to anesthetize the skin around the previous lumpectomy incision.  The incision was opened.  A  seroma was evacuated.  Additional local anesthesia was delivered medial, lateral, posterior, anterior, inferior and superiorly within the lumpectomy cavity.  A full thickness re-excision was performed.  An orientation suture was placed anteriorly.  The new margin was inked and the specimen was submitted to pathology.  Hemostasis was achieved with cautery.  Closure was performed in 2 layers with a 3-0 Vicryl and 4 0 monocryl subcuticular closure.     Dermabond was  applied.  At the end of the operation, all sponge, instrument and needle counts were correct.   Findings: grossly clear surgical margins  Estimated Blood Loss:  less than 50 mL                  Total IV Fluids: per record          Specimens: all margins                   Complications:  None; patient tolerated the procedure well.         Disposition: PACU - hemodynamically stable.         Condition: stable  Attending Attestation: I performed the procedure.

## 2021-01-11 NOTE — Discharge Instructions (Signed)
°Post Anesthesia Home Care Instructions ° °Activity: °Get plenty of rest for the remainder of the day. A responsible individual must stay with you for 24 hours following the procedure.  °For the next 24 hours, DO NOT: °-Drive a car °-Operate machinery °-Drink alcoholic beverages °-Take any medication unless instructed by your physician °-Make any legal decisions or sign important papers. ° °Meals: °Start with liquid foods such as gelatin or soup. Progress to regular foods as tolerated. Avoid greasy, spicy, heavy foods. If nausea and/or vomiting occur, drink only clear liquids until the nausea and/or vomiting subsides. Call your physician if vomiting continues. ° °Special Instructions/Symptoms: °Your throat may feel dry or sore from the anesthesia or the breathing tube placed in your throat during surgery. If this causes discomfort, gargle with warm salt water. The discomfort should disappear within 24 hours. ° °If you had a scopolamine patch placed behind your ear for the management of post- operative nausea and/or vomiting: ° °1. The medication in the patch is effective for 72 hours, after which it should be removed.  Wrap patch in a tissue and discard in the trash. Wash hands thoroughly with soap and water. °2. You may remove the patch earlier than 72 hours if you experience unpleasant side effects which may include dry mouth, dizziness or visual disturbances. °3. Avoid touching the patch. Wash your hands with soap and water after contact with the patch. °  ° ° ° ° °[facility] °Office Phone Number [phone] ° °BREAST BIOPSY/ PARTIAL MASTECTOMY: POST OP INSTRUCTIONS ° °Always review your discharge instruction sheet given to you by the facility where your surgery was performed. ° °IF YOU HAVE DISABILITY OR FAMILY LEAVE FORMS, YOU MUST BRING THEM TO THE OFFICE FOR PROCESSING.  DO NOT GIVE THEM TO YOUR DOCTOR. ° °1. A prescription for pain medication may be given to you upon discharge.  Take your  pain medication as prescribed, if needed.  If narcotic pain medicine is not needed, then you may take acetaminophen (Tylenol) or ibuprofen (Advil) as needed. °2. Take your usually prescribed medications unless otherwise directed °3. If you need a refill on your pain medication, please contact your pharmacy.  They will contact our office to request authorization.  Prescriptions will not be filled after 5pm or on week-ends. °4. You should eat very light the first 24 hours after surgery, such as soup, crackers, pudding, etc.  Resume your normal diet the day after surgery. °5. Most patients will experience some swelling and bruising in the breast.  Ice packs and a good support bra will help.  Swelling and bruising can take several days to resolve.  °6. It is common to experience some constipation if taking pain medication after surgery.  Increasing fluid intake and taking a stool softener will usually help or prevent this problem from occurring.  A mild laxative (Milk of Magnesia or Miralax) should be taken according to package directions if there are no bowel movements after 48 hours. °7. Unless discharge instructions indicate otherwise, you may remove your bandages 24-48 hours after surgery, and you may shower at that time.  You may have steri-strips (small skin tapes) in place directly over the incision.  These strips should be left on the skin for 7-10 days.  If your surgeon used skin glue on the incision, you may shower in 24 hours.  The glue will flake off over the next 2-3 weeks.  Any sutures or staples will be removed at the office during your follow-up visit. °  8. ACTIVITIES:  You may resume regular daily activities (gradually increasing) beginning the next day.  Wearing a good support bra or sports bra minimizes pain and swelling.  You may have sexual intercourse when it is comfortable. °a. You may drive when you no longer are taking prescription pain medication, you can comfortably wear a seatbelt, and you can  safely maneuver your car and apply brakes. °b. RETURN TO WORK:  ______________________________________________________________________________________ °9. You should see your doctor in the office for a follow-up appointment approximately two weeks after your surgery.  Your doctor’s nurse will typically make your follow-up appointment when she calls you with your pathology report.  Expect your pathology report 2-3 business days after your surgery.  You may call to check if you do not hear from us after three days. °10. OTHER INSTRUCTIONS: _______________________________________________________________________________________________ _____________________________________________________________________________________________________________________________________ °_____________________________________________________________________________________________________________________________________ °_____________________________________________________________________________________________________________________________________ ° °WHEN TO CALL YOUR DOCTOR: °1. Fever over 101.0 °2. Nausea and/or vomiting. °3. Extreme swelling or bruising. °4. Continued bleeding from incision. °5. Increased pain, redness, or drainage from the incision. ° °The clinic staff is available to answer your questions during regular business hours.  Please don’t hesitate to call and ask to speak to one of the nurses for clinical concerns.  If you have a medical emergency, go to the nearest emergency room or call 911.  A surgeon from [facility] is always on call at the hospital. ° °For further questions, please visit [email or web address]  °

## 2021-01-11 NOTE — Anesthesia Preprocedure Evaluation (Signed)
Anesthesia Evaluation  Patient identified by MRN, date of birth, ID band Patient awake    Reviewed: Allergy & Precautions, NPO status , Patient's Chart, lab work & pertinent test results  History of Anesthesia Complications Negative for: history of anesthetic complications  Airway Mallampati: II  TM Distance: >3 FB Neck ROM: Full    Dental  (+) Missing, Teeth Intact   Pulmonary neg pulmonary ROS, former smoker,    Pulmonary exam normal        Cardiovascular hypertension, + Peripheral Vascular Disease  Normal cardiovascular exam     Neuro/Psych negative neurological ROS  negative psych ROS   GI/Hepatic Neg liver ROS, GERD  ,  Endo/Other  negative endocrine ROS  Renal/GU Renal InsufficiencyRenal disease  negative genitourinary   Musculoskeletal  (+) Arthritis ,   Abdominal   Peds  Hematology negative hematology ROS (+)   Anesthesia Other Findings  Fibromuscular dysplasia Breast cancer  Reproductive/Obstetrics                             Anesthesia Physical Anesthesia Plan  ASA: II  Anesthesia Plan: General   Post-op Pain Management:    Induction: Intravenous  PONV Risk Score and Plan: 3 and Ondansetron, Dexamethasone, Midazolam and Treatment may vary due to age or medical condition  Airway Management Planned: LMA  Additional Equipment: None  Intra-op Plan:   Post-operative Plan: Extubation in OR  Informed Consent: I have reviewed the patients History and Physical, chart, labs and discussed the procedure including the risks, benefits and alternatives for the proposed anesthesia with the patient or authorized representative who has indicated his/her understanding and acceptance.     Dental advisory given  Plan Discussed with:   Anesthesia Plan Comments:         Anesthesia Quick Evaluation

## 2021-01-11 NOTE — Anesthesia Postprocedure Evaluation (Signed)
Anesthesia Post Note  Patient: Kristy [name]  Procedure(s) Performed: RE-EXCISION OF RIGHT BREAST LUMPECTOMY (Right Breast)     Patient location during evaluation: PACU Level of consciousness: awake and alert Pain management: pain level controlled Vital Signs Assessment: post-procedure vital signs reviewed and stable Respiratory status: spontaneous breathing, nonlabored ventilation and respiratory function stable Cardiovascular status: blood pressure returned to baseline and stable Postop Assessment: no apparent nausea or vomiting Anesthetic complications: no   No complications documented.  Last Vitals:  Vitals:   01/11/21 1400 01/11/21 1410  BP: 120/66 128/64  Pulse: 65 67  Resp: 17 17  Temp:  (!) 36.3 C  SpO2: 96% 99%    Last Pain:  Vitals:   01/11/21 1410  TempSrc:   PainSc: 0-No pain                 Lidia Collum

## 2021-01-11 NOTE — Anesthesia Procedure Notes (Signed)
Procedure Name: LMA Insertion Date/Time: 01/11/2021 12:55 PM Performed by: Maryella Shivers, CRNA Pre-anesthesia Checklist: Patient identified, Emergency Drugs available, Suction available and Patient being monitored Patient Re-evaluated:Patient Re-evaluated prior to induction Oxygen Delivery Method: Circle system utilized Preoxygenation: Pre-oxygenation with 100% oxygen Induction Type: IV induction Ventilation: Mask ventilation without difficulty LMA: LMA inserted LMA Size: 4.0 Number of attempts: 1 Airway Equipment and Method: Bite block Placement Confirmation: positive ETCO2 Tube secured with: Tape Dental Injury: Teeth and Oropharynx as per pre-operative assessment

## 2021-01-11 NOTE — Transfer of Care (Signed)
Immediate Anesthesia Transfer of Care Note  Patient: Kristy [name]  Procedure(s) Performed: RE-EXCISION OF RIGHT BREAST LUMPECTOMY (Right Breast)  Patient Location: PACU  Anesthesia Type:General  Level of Consciousness: sedated  Airway & Oxygen Therapy: Patient Spontanous Breathing and Patient connected to face mask oxygen  Post-op Assessment: Report given to RN and Post -op Vital signs reviewed and stable  Post vital signs: Reviewed and stable  Last Vitals:  Vitals Value Taken Time  BP 103/62 01/11/21 1340  Temp    Pulse 61 01/11/21 1342  Resp 11 01/11/21 1342  SpO2 100 % 01/11/21 1342  Vitals shown include unvalidated device data.  Last Pain:  Vitals:   01/11/21 1121  TempSrc: Oral  PainSc: 3          Complications: No complications documented.

## 2021-01-11 NOTE — Interval H&P Note (Signed)
History and Physical Interval Note:  01/11/2021 12:33 PM  Kristy [name]  has presented today for surgery, with the diagnosis of DCIS.  The various methods of treatment have been discussed with the patient and family. After consideration of risks, benefits and other options for treatment, the patient has consented to  Procedure(s): RE-EXCISION OF RIGHT BREAST LUMPECTOMY (Right) as a surgical intervention.  The patient's history has been reviewed, patient examined, no change in status, stable for surgery.  I have reviewed the patient's chart and labs.  Questions were answered to the patient's satisfaction.     [place]

## 2021-01-12 ENCOUNTER — Encounter (HOSPITAL_BASED_OUTPATIENT_CLINIC_OR_DEPARTMENT_OTHER): Payer: Self-pay | Admitting: Surgery

## 2021-01-13 LAB — SURGICAL PATHOLOGY

## 2021-01-17 ENCOUNTER — Encounter: Payer: Self-pay | Admitting: *Deleted

## 2021-01-17 DIAGNOSIS — D0511 Intraductal carcinoma in situ of right breast: Secondary | ICD-10-CM

## 2021-01-18 ENCOUNTER — Encounter: Payer: Self-pay | Admitting: *Deleted

## 2021-02-08 ENCOUNTER — Ambulatory Visit
Admission: RE | Admit: 2021-02-08 | Discharge: 2021-02-08 | Disposition: A | Discharge status: Home / Self Care | Payer: Medicare Other | Source: Ambulatory Visit | Attending: Radiation Oncology | Admitting: Radiation Oncology

## 2021-02-08 ENCOUNTER — Other Ambulatory Visit: Payer: Self-pay

## 2021-02-08 ENCOUNTER — Encounter: Payer: Self-pay | Admitting: Radiation Oncology

## 2021-02-08 VITALS — BP 156/89 | HR 76 | Temp 97.8°F | Resp 20 | Ht 62.0 in | Wt 175.8 lb

## 2021-02-08 DIAGNOSIS — Z510 Encounter for antineoplastic radiation therapy: Secondary | ICD-10-CM | POA: Insufficient documentation

## 2021-02-08 DIAGNOSIS — D0511 Intraductal carcinoma in situ of right breast: Secondary | ICD-10-CM | POA: Diagnosis not present

## 2021-02-08 HISTORY — DX: Disease of gallbladder, unspecified: K82.9

## 2021-02-08 NOTE — Progress Notes (Signed)
Radiation Oncology         [phone] ________________________________  Name: Kristy [name]        MRN: [id number]  Date of Service: 02/08/2021 DOB: 13-Mar-1941  RJ:JOACZYS, Mervyn Gay, MD  Magrinat, Virgie Dad, MD     REFERRING PHYSICIAN: Magrinat, Virgie Dad, MD   DIAGNOSIS: The encounter diagnosis was Ductal carcinoma in situ (DCIS) of right breast.   HISTORY OF PRESENT ILLNESS: Kristy [name] is a 80 y.o. female with a newly diagnosed right breast cancer.  The patient was found to have calcifications by screening mammogram in November 2021. She underwent stereotactic biopsy on 11/09/2020 which revealed ER positive DCIS with necrosis and calcifications and associated complex sclerosing lesion, the DCIS was classified as high-grade at that time.  She underwent MRI of the breast on 11/15/2020 showing enhancement around a small hematoma and biopsy tract consistent with her diagnosis of DCIS and complex sclerosing lesion there was also an indeterminate 6 mm mass adjacent but superior and posterior to the biopsy site and a second indeterminate 1.8 cm area of non-mass-like enhancement in the central right breast no evidence of abnormalities were seen in the left breast and no abnormal appearing lymph nodes were identified.  She return for additional sampling and a second biopsy of the right breast was again consistent with ductal carcinoma in situ with calcifications and necrosis again estrogen positive, PR negative felt to be high-grade.  Another biopsy of the right breast x2 was performed on 11/29/2020 again DCIS with necrosis and calcifications were seen in the first specimen and DCIS with calcifications partially involving a papillary lesion with columnar cell hyperplasia atypia and calcifications as well as fibroadenoma were again seen.    Since her last visit, the patient has undergone lumpectomy of the right breast on 12/16/2020 final pathology revealed multifocal intermediate to high-grade DCIS,  with complex sclerosing lesion usual ductal hyperplasia and fibroadenoma with calcifications, the actual measurement of her tumor dimensions is not well documented but there was a focus of low-grade DCIS measured at 6 mm however it appears that the largest specimen was 1.5 cm this was both the barbell and the cylinder-shaped clip specimens.  There was resection margins of the anterior superior margin with incidental intraductal papilloma, fibrocystic change and usual ductal hyperplasia the medial posterior margin showed fibroadenomatoid change with calcifications and incidental intraductal papilloma the inferolateral margin showed complex sclerosing lesion with low-grade DCIS and the in situ disease was at the cauterized inferior margin and less than 1 mm from the lateral margin so she did undergo reexcision surgery of her margins on 01/11/2021 no residual DCIS was seen in any of the specimens including the superior inferior anterior medial lateral and posterior margins.  She is seen today to discuss adjuvant treatment recommendations and to proceed with simulation.   PREVIOUS RADIATION THERAPY: No   PAST MEDICAL HISTORY:  Past Medical History:  Diagnosis Date  . Allergy   . 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 Hulda Humphrey of vascular surgery in [place]  . Arthritis    rt knee, foot  . Breast cancer ([place]) 11/29/2020   right breast DCIS  . Cataract    forming right eye   . 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 Constipation    uses stool softener PRN- uses once a week to once every 2 weeks   . Fibromuscular dysplasia ([place])   .  Gallbladder disease   . GERD (gastroesophageal reflux disease)   . Hyperlipidemia   . Hypertension    For 16 years; urinary catecholamines within normal limits 12/09; renal Doppler ultrasound showed no evidence for renal artery stenosis  . Normal echocardiogram  4/10   LVEF 65%; no regional wall motion abnormalities; normal RV size and function; pulmonic valve had increased gradient across w/ peak gradient of about 36 mmHg (range of moderate pulmonic stenosis) followup showed normal valve  . Papilloma of breast    right  . Tinnitus of left ear        PAST SURGICAL HISTORY: Past Surgical History:  Procedure Laterality Date  . ANGIOPLASTY     2012  . BREAST BIOPSY Right 2006   benign  . BREAST EXCISIONAL BIOPSY Right 07/2017   benign  . BREAST LUMPECTOMY WITH RADIOACTIVE SEED LOCALIZATION Right 08/14/2017   Procedure: RIGHT BREAST LUMPECTOMY WITH RADIOACTIVE SEED LOCALIZATION;  Surgeon: [name], MD;  Location: [place];  Service: General;  Laterality: Right;  . BREAST LUMPECTOMY WITH RADIOACTIVE SEED LOCALIZATION Right 12/16/2020   Procedure: RADIOACTIVE SEED GUIDED TIMES 3 RIGHT BREAST LUMPECTOMY;  Surgeon: [name], MD;  Location: [place];  Service: General;  Laterality: Right;  . Cardiolyte  11/07   Neg  . COLONOSCOPY    . KNEE CARTILAGE SURGERY     right knee  . POLYPECTOMY    . RE-EXCISION OF BREAST LUMPECTOMY Right 01/11/2021   Procedure: RE-EXCISION OF RIGHT BREAST LUMPECTOMY;  Surgeon: [name], MD;  Location: [place];  Service: General;  Laterality: Right;  . TOTAL ABDOMINAL HYSTERECTOMY     no cervix  . UPPER GASTROINTESTINAL ENDOSCOPY       FAMILY HISTORY:  Family History  Problem Relation Age of Onset  . Prostate cancer Father   . Lung cancer Father   . Colon cancer Maternal Uncle   . Colon cancer Paternal Uncle   . Colon polyps Sister   . Coronary artery disease Paternal Grandmother   . Goiter Other        ?  . Esophageal cancer Neg Hx   . Stomach cancer Neg Hx   . Rectal cancer Neg Hx   . Bladder Cancer Neg Hx   . Kidney cancer Neg Hx      SOCIAL HISTORY:  reports that she quit smoking about 38 years ago. She has never used smokeless  tobacco. She reports that she does not drink alcohol and does not use drugs.  The patient is widowed but in a relationship and lives in [place].  She is retired from working in a financial setting.    ALLERGIES: Vibramycin [doxycycline]   MEDICATIONS:  Current Outpatient Medications  Medication Sig Dispense Refill  . atorvastatin (LIPITOR) 20 MG tablet TAKE 1 TABLET BY MOUTH  DAILY 90 tablet 0  . HYDROcodone-acetaminophen (NORCO/VICODIN) 5-325 MG tablet Take 1 tablet by mouth every 6 (six) hours as needed for moderate pain. 15 tablet 0  . HYDROcodone-acetaminophen (NORCO/VICODIN) 5-325 MG tablet Take 1 tablet by mouth every 6 (six) hours as needed for moderate pain. 15 tablet 0  . metoprolol succinate (TOPROL-XL) 100 MG 24 hr tablet TAKE 1 TABLET BY MOUTH  DAILY WITH OR IMMEDIATELY  FOLLOWING A MEAL. 90 tablet 0  . Multiple Vitamin (MULTIVITAMIN WITH MINERALS) TABS tablet Take 1 tablet by mouth daily.    Marland Kitchen NIFEdipine (PROCARDIA XL/NIFEDICAL XL) 60 MG 24 hr tablet TAKE 1 TABLET BY MOUTH  DAILY 90 tablet 0  . triamterene-hydrochlorothiazide (MAXZIDE-25) 37.5-25 MG tablet Take 1 tablet by mouth daily. 90 tablet 2  . valACYclovir (VALTREX) 500 MG tablet Take 1 tablet (500 mg total) by mouth daily. Take daily for suppressive therapy (Patient not taking: No sig reported) 30 tablet 11   No current facility-administered medications for this encounter.     REVIEW OF SYSTEMS: On review of systems, the patient reports that she is doing well overall. She had an aspiration of a seroma yesterday but still feels some fullness.    PHYSICAL EXAM:  Wt Readings from Last 3 Encounters:  02/08/21 175 lb 12.8 oz (79.7 kg)  01/11/21 175 lb 0.7 oz (79.4 kg)  12/27/20 175 lb (79.4 kg)   Temp Readings from Last 3 Encounters:  02/08/21 97.8 F (36.6 C)  01/11/21 (!) 97.3 F (36.3 C)  12/27/20 97.7 F (36.5 C) (Tympanic)   BP Readings from Last 3 Encounters:  02/08/21 (!) 156/89  01/11/21 128/64   12/27/20 136/61   Pulse Readings from Last 3 Encounters:  02/08/21 76  01/11/21 67  12/27/20 79    In general this is a well appearing African American female in no acute distress. She's alert and oriented x4 and appropriate throughout the examination. Cardiopulmonary assessment is negative for acute distress and she exhibits normal effort. The right breast has palpable fullness consistent with induration without erythema. No fluctuance is noted however.    ECOG = 0  0 - Asymptomatic (Fully active, able to carry on all predisease activities without restriction)  1 - Symptomatic but completely ambulatory (Restricted in physically strenuous activity but ambulatory and able to carry out work of a light or sedentary nature. For example, light housework, office work)  2 - Symptomatic, <50% in bed during the day (Ambulatory and capable of all self care but unable to carry out any work activities. Up and about more than 50% of waking hours)  3 - Symptomatic, >50% in bed, but not bedbound (Capable of only limited self-care, confined to bed or chair 50% or more of waking hours)  4 - Bedbound (Completely disabled. Cannot carry on any self-care. Totally confined to bed or chair)  5 - Death   Eustace Pen MM, Creech RH, Tormey DC, et al. [phone]). "Toxicity and response criteria of the [facility] Group". [place] Oncol. 5 (6): 649-55    LABORATORY DATA:  Lab Results  Component Value Date   WBC 8.0 12/13/2020   HGB 13.6 12/13/2020   HCT 41.1 12/13/2020   MCV 96.5 12/13/2020   PLT 359 12/13/2020   Lab Results  Component Value Date   NA 137 01/07/2021   K 4.5 01/07/2021   CL 105 01/07/2021   CO2 24 01/07/2021   Lab Results  Component Value Date   ALT 17 12/13/2020   AST 19 12/13/2020   ALKPHOS 68 12/13/2020   BILITOT 0.9 12/13/2020      RADIOGRAPHY: No results found.     IMPRESSION/PLAN: 1. 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 Dr. Lisbeth Renshaw reviews  the final pathology findings and reviews the nature of noninvasive breast disease. She is now finished with surgery and healing well.  Dr. Lisbeth Renshaw discusses the rationale for external radiotherapy to the breast  to reduce risks of local recurrence followed by antiestrogen therapy. We discussed the risks, benefits, short, and long term effects of radiotherapy, as well as the curative intent, and the patient is interested in proceeding. Dr. Lisbeth Renshaw discusses the  delivery and logistics of radiotherapy and recommends  4 weeks of radiotherapy. Written consent is obtained and placed in the chart, a copy was provided to the patient. She will proceed with simulation this morning. 2. Seroma. The patient had this drained yesterday. I don't detect fluid today but she is encouraged to wear a sports bra for the remainder of the week to try to prevent recurrence of this. She is aware of the need to resimulate if this recurred, but is interested in still proceeding with simulation today.   In a visit lasting 45 minutes, greater than 50% of the time was spent face to face discussing the patient's condition, in preparation for the discussion, and coordinating the patient's care.   The above documentation reflects my direct findings during this shared patient visit. Please see the separate note by Dr. Lisbeth Renshaw on this date for the remainder of the patient's plan of care.    Carola Rhine, PAC

## 2021-02-08 NOTE — Progress Notes (Signed)
New Breast Cancer Diagnosis: Right breast    Histology per Pathology Report: Right Breast 01/11/2021    Right Breast Lumpectomy 12/16/2020   Surgeon and surgical plan, if any: Dr. Brantley Stage - Saw her surgeon on yesterday and has some fluid drained from her Right breast.  Medical oncologist, treatment if any:   Dr. Jana Hakim - 03/22/2021  Family History of Breast/Ovarian/Prostate Cancer: Dad had prostate cancer.  Lymphedema issues, if any:  Has a fluid filled knot  Pain issues, if any: None  SAFETY ISSUES: Prior radiation? No Pacemaker/ICD? No Possible current pregnancy? Hysterectomy Is the patient on methotrexate? No  Current Complaints / other details:

## 2021-02-11 DIAGNOSIS — D0511 Intraductal carcinoma in situ of right breast: Secondary | ICD-10-CM | POA: Diagnosis not present

## 2021-02-11 DIAGNOSIS — Z510 Encounter for antineoplastic radiation therapy: Secondary | ICD-10-CM | POA: Diagnosis not present

## 2021-02-14 ENCOUNTER — Encounter: Payer: Self-pay | Admitting: *Deleted

## 2021-02-16 ENCOUNTER — Ambulatory Visit
Admission: RE | Admit: 2021-02-16 | Discharge: 2021-02-16 | Disposition: A | Discharge status: Home / Self Care | Payer: Medicare Other | Source: Ambulatory Visit | Attending: Radiation Oncology | Admitting: Radiation Oncology

## 2021-02-16 ENCOUNTER — Other Ambulatory Visit: Payer: Self-pay

## 2021-02-16 DIAGNOSIS — Z510 Encounter for antineoplastic radiation therapy: Secondary | ICD-10-CM | POA: Diagnosis not present

## 2021-02-16 DIAGNOSIS — D0511 Intraductal carcinoma in situ of right breast: Secondary | ICD-10-CM | POA: Diagnosis not present

## 2021-02-17 ENCOUNTER — Other Ambulatory Visit: Payer: Self-pay

## 2021-02-17 ENCOUNTER — Ambulatory Visit
Admission: RE | Admit: 2021-02-17 | Discharge: 2021-02-17 | Disposition: A | Discharge status: Home / Self Care | Payer: Medicare Other | Source: Ambulatory Visit | Attending: Radiation Oncology | Admitting: Radiation Oncology

## 2021-02-17 DIAGNOSIS — Z510 Encounter for antineoplastic radiation therapy: Secondary | ICD-10-CM | POA: Diagnosis not present

## 2021-02-17 DIAGNOSIS — D0511 Intraductal carcinoma in situ of right breast: Secondary | ICD-10-CM | POA: Diagnosis not present

## 2021-02-17 MED ORDER — RADIAPLEXRX EX GEL: Freq: Once | CUTANEOUS | Status: AC

## 2021-02-17 MED ORDER — ALRA NON-METALLIC DEODORANT (RAD-ONC)
1.0000 "application " | Freq: Once | TOPICAL | Status: AC
  Administered 2021-02-17: 1 via TOPICAL

## 2021-02-17 NOTE — Progress Notes (Signed)
Pt here for patient teaching.  Pt given Radiation and You booklet, skin care instructions, Alra deodorant and Radiaplex gel.  Reviewed areas of pertinence such as fatigue, hair loss, skin changes, breast tenderness and breast swelling . Pt able to give teach back of to pat skin and use unscented/gentle soap,apply Radiaplex bid, avoid applying anything to skin within 4 hours of treatment, avoid wearing an under wire bra and to use an electric razor if they must shave. Pt verbalizes understanding of information given and will contact nursing with any questions or concerns.     Nathaneil Feagans M. Blanca Carreon RN, BSN      

## 2021-02-18 ENCOUNTER — Ambulatory Visit
Admission: RE | Admit: 2021-02-18 | Discharge: 2021-02-18 | Disposition: A | Discharge status: Home / Self Care | Payer: Medicare Other | Source: Ambulatory Visit | Attending: Radiation Oncology | Admitting: Radiation Oncology

## 2021-02-18 DIAGNOSIS — Z510 Encounter for antineoplastic radiation therapy: Secondary | ICD-10-CM | POA: Diagnosis not present

## 2021-02-18 DIAGNOSIS — D0511 Intraductal carcinoma in situ of right breast: Secondary | ICD-10-CM | POA: Insufficient documentation

## 2021-02-21 ENCOUNTER — Ambulatory Visit
Admission: RE | Admit: 2021-02-21 | Discharge: 2021-02-21 | Disposition: A | Discharge status: Home / Self Care | Payer: Medicare Other | Source: Ambulatory Visit | Attending: Radiation Oncology | Admitting: Radiation Oncology

## 2021-02-21 ENCOUNTER — Other Ambulatory Visit: Payer: Self-pay

## 2021-02-21 DIAGNOSIS — Z510 Encounter for antineoplastic radiation therapy: Secondary | ICD-10-CM | POA: Diagnosis not present

## 2021-02-21 DIAGNOSIS — D0511 Intraductal carcinoma in situ of right breast: Secondary | ICD-10-CM | POA: Diagnosis not present

## 2021-02-22 ENCOUNTER — Ambulatory Visit
Admission: RE | Admit: 2021-02-22 | Discharge: 2021-02-22 | Disposition: A | Discharge status: Home / Self Care | Payer: Medicare Other | Source: Ambulatory Visit | Attending: Radiation Oncology | Admitting: Radiation Oncology

## 2021-02-22 DIAGNOSIS — Z510 Encounter for antineoplastic radiation therapy: Secondary | ICD-10-CM | POA: Diagnosis not present

## 2021-02-22 DIAGNOSIS — D0511 Intraductal carcinoma in situ of right breast: Secondary | ICD-10-CM | POA: Diagnosis not present

## 2021-02-23 ENCOUNTER — Ambulatory Visit
Admission: RE | Admit: 2021-02-23 | Discharge: 2021-02-23 | Disposition: A | Discharge status: Home / Self Care | Payer: Medicare Other | Source: Ambulatory Visit | Attending: Radiation Oncology | Admitting: Radiation Oncology

## 2021-02-23 DIAGNOSIS — Z510 Encounter for antineoplastic radiation therapy: Secondary | ICD-10-CM | POA: Diagnosis not present

## 2021-02-23 DIAGNOSIS — D0511 Intraductal carcinoma in situ of right breast: Secondary | ICD-10-CM | POA: Diagnosis not present

## 2021-02-24 ENCOUNTER — Ambulatory Visit
Admission: RE | Admit: 2021-02-24 | Discharge: 2021-02-24 | Disposition: A | Discharge status: Home / Self Care | Payer: Medicare Other | Source: Ambulatory Visit | Attending: Radiation Oncology | Admitting: Radiation Oncology

## 2021-02-24 ENCOUNTER — Other Ambulatory Visit: Payer: Self-pay | Admitting: Family Medicine

## 2021-02-24 DIAGNOSIS — Z510 Encounter for antineoplastic radiation therapy: Secondary | ICD-10-CM | POA: Diagnosis not present

## 2021-02-24 DIAGNOSIS — D0511 Intraductal carcinoma in situ of right breast: Secondary | ICD-10-CM | POA: Diagnosis not present

## 2021-02-25 ENCOUNTER — Other Ambulatory Visit: Payer: Self-pay

## 2021-02-25 ENCOUNTER — Ambulatory Visit
Admission: RE | Admit: 2021-02-25 | Discharge: 2021-02-25 | Disposition: A | Discharge status: Home / Self Care | Payer: Medicare Other | Source: Ambulatory Visit | Attending: Radiation Oncology | Admitting: Radiation Oncology

## 2021-02-25 DIAGNOSIS — D0511 Intraductal carcinoma in situ of right breast: Secondary | ICD-10-CM | POA: Diagnosis not present

## 2021-02-25 DIAGNOSIS — Z510 Encounter for antineoplastic radiation therapy: Secondary | ICD-10-CM | POA: Diagnosis not present

## 2021-02-25 NOTE — Telephone Encounter (Signed)
Please schedule CPE with Dr. Diona Browner for sometime after 03/03/2021.

## 2021-02-25 NOTE — Telephone Encounter (Signed)
Spoke with patient scheduled CPE

## 2021-02-28 ENCOUNTER — Ambulatory Visit
Admission: RE | Admit: 2021-02-28 | Discharge: 2021-02-28 | Disposition: A | Discharge status: Home / Self Care | Payer: Medicare Other | Source: Ambulatory Visit | Attending: Radiation Oncology | Admitting: Radiation Oncology

## 2021-02-28 DIAGNOSIS — D0511 Intraductal carcinoma in situ of right breast: Secondary | ICD-10-CM | POA: Diagnosis not present

## 2021-02-28 DIAGNOSIS — Z510 Encounter for antineoplastic radiation therapy: Secondary | ICD-10-CM | POA: Diagnosis not present

## 2021-03-01 ENCOUNTER — Ambulatory Visit
Admission: RE | Admit: 2021-03-01 | Discharge: 2021-03-01 | Disposition: A | Discharge status: Home / Self Care | Payer: Medicare Other | Source: Ambulatory Visit | Attending: Radiation Oncology | Admitting: Radiation Oncology

## 2021-03-01 ENCOUNTER — Other Ambulatory Visit: Payer: Self-pay

## 2021-03-01 DIAGNOSIS — D0511 Intraductal carcinoma in situ of right breast: Secondary | ICD-10-CM | POA: Diagnosis not present

## 2021-03-01 DIAGNOSIS — Z510 Encounter for antineoplastic radiation therapy: Secondary | ICD-10-CM | POA: Diagnosis not present

## 2021-03-02 ENCOUNTER — Ambulatory Visit: Payer: Medicare Other | Admitting: Radiation Oncology

## 2021-03-02 ENCOUNTER — Ambulatory Visit
Admission: RE | Admit: 2021-03-02 | Discharge: 2021-03-02 | Disposition: A | Discharge status: Home / Self Care | Payer: Medicare Other | Source: Ambulatory Visit | Attending: Radiation Oncology | Admitting: Radiation Oncology

## 2021-03-02 DIAGNOSIS — D0511 Intraductal carcinoma in situ of right breast: Secondary | ICD-10-CM | POA: Diagnosis not present

## 2021-03-02 DIAGNOSIS — Z510 Encounter for antineoplastic radiation therapy: Secondary | ICD-10-CM | POA: Diagnosis not present

## 2021-03-03 ENCOUNTER — Ambulatory Visit
Admission: RE | Admit: 2021-03-03 | Discharge: 2021-03-03 | Disposition: A | Discharge status: Home / Self Care | Payer: Medicare Other | Source: Ambulatory Visit | Attending: Radiation Oncology | Admitting: Radiation Oncology

## 2021-03-03 ENCOUNTER — Other Ambulatory Visit: Payer: Self-pay

## 2021-03-03 ENCOUNTER — Ambulatory Visit (INDEPENDENT_AMBULATORY_CARE_PROVIDER_SITE_OTHER): Payer: Medicare Other

## 2021-03-03 DIAGNOSIS — Z0000 Encounter for general adult medical examination without abnormal findings: Secondary | ICD-10-CM

## 2021-03-03 DIAGNOSIS — D0511 Intraductal carcinoma in situ of right breast: Secondary | ICD-10-CM | POA: Diagnosis not present

## 2021-03-03 DIAGNOSIS — Z510 Encounter for antineoplastic radiation therapy: Secondary | ICD-10-CM | POA: Diagnosis not present

## 2021-03-03 NOTE — Patient Instructions (Signed)
Kristy [name] , Thank you for taking time to come for your Medicare Wellness Visit. I appreciate your ongoing commitment to your health goals. Please review the following plan we discussed and let me know if I can assist you in the future.   Screening recommendations/referrals: Colonoscopy: Up to date, completed 05/15/2019, due 04/2021 Mammogram: Up to date, completed 10/29/2020, due 10/2021 Bone Density: Up to date, completed 09/15/2019, due 08/2021 Recommended yearly ophthalmology/optometry visit for glaucoma screening and checkup Recommended yearly dental visit for hygiene and checkup  Vaccinations: Influenza vaccine: Up to date, completed 09/11/2020, due 06/2021 Pneumococcal vaccine: Completed series Tdap vaccine: decline-insurance  Shingles vaccine: due, check with your insurance regarding coverage if interested    Covid-19:completed 3 doses, fourth dose due, Patient will talk with oncology first to make sure it is safe for her to get   Advanced directives: Advance directive discussed with you today. Even though you declined this today please call our office should you change your mind and we can give you the proper paperwork for you to fill out.  Conditions/risks identified: hypertension, hyperlipidemia   Next appointment: Follow up in one year for your annual wellness visit    Preventive Care 32 Years and Older, Female Preventive care refers to lifestyle choices and visits with your health care provider that can promote health and wellness. What does preventive care include?  A yearly physical exam. This is also called an annual well check.  Dental exams once or twice a year.  Routine eye exams. Ask your health care provider how often you should have your eyes checked.  Personal lifestyle choices, including:  Daily care of your teeth and gums.  Regular physical activity.  Eating a healthy diet.  Avoiding tobacco and drug use.  Limiting alcohol use.  Practicing safe  sex.  Taking low-dose aspirin every day.  Taking vitamin and mineral supplements as recommended by your health care provider. What happens during an annual well check? The services and screenings done by your health care provider during your annual well check will depend on your age, overall health, lifestyle risk factors, and family history of disease. Counseling  Your health care provider may ask you questions about your:  Alcohol use.  Tobacco use.  Drug use.  Emotional well-being.  Home and relationship well-being.  Sexual activity.  Eating habits.  History of falls.  Memory and ability to understand (cognition).  Work and work Statistician.  Reproductive health. Screening  You may have the following tests or measurements:  Height, weight, and BMI.  Blood pressure.  Lipid and cholesterol levels. These may be checked every 5 years, or more frequently if you are over 5 years old.  Skin check.  Lung cancer screening. You may have this screening every year starting at age 44 if you have a 30-pack-year history of smoking and currently smoke or have quit within the past 15 years.  Fecal occult blood test (FOBT) of the stool. You may have this test every year starting at age 51.  Flexible sigmoidoscopy or colonoscopy. You may have a sigmoidoscopy every 5 years or a colonoscopy every 10 years starting at age 31.  Hepatitis C blood test.  Hepatitis B blood test.  Sexually transmitted disease (STD) testing.  Diabetes screening. This is done by checking your blood sugar (glucose) after you have not eaten for a while (fasting). You may have this done every 1-3 years.  Bone density scan. This is done to screen for osteoporosis. You may have this  done starting at age 34.  Mammogram. This may be done every 1-2 years. Talk to your health care provider about how often you should have regular mammograms. Talk with your health care provider about your test results,  treatment options, and if necessary, the need for more tests. Vaccines  Your health care provider may recommend certain vaccines, such as:  Influenza vaccine. This is recommended every year.  Tetanus, diphtheria, and acellular pertussis (Tdap, Td) vaccine. You may need a Td booster every 10 years.  Zoster vaccine. You may need this after age 68.  Pneumococcal 13-valent conjugate (PCV13) vaccine. One dose is recommended after age 78.  Pneumococcal polysaccharide (PPSV23) vaccine. One dose is recommended after age 84. Talk to your health care provider about which screenings and vaccines you need and how often you need them. This information is not intended to replace advice given to you by your health care provider. Make sure you discuss any questions you have with your health care provider. Document Released: 12/03/2015 Document Revised: 07/26/2016 Document Reviewed: 09/07/2015 Elsevier Interactive Patient Education  2017 [employer] Prevention in the Home Falls can cause injuries. They can happen to people of all ages. There are many things you can do to make your home safe and to help prevent falls. What can I do on the outside of my home?  Regularly fix the edges of walkways and driveways and fix any cracks.  Remove anything that might make you trip as you walk through a door, such as a raised step or threshold.  Trim any bushes or trees on the path to your home.  Use bright outdoor lighting.  Clear any walking paths of anything that might make someone trip, such as rocks or tools.  Regularly check to see if handrails are loose or broken. Make sure that both sides of any steps have handrails.  Any raised decks and porches should have guardrails on the edges.  Have any leaves, snow, or ice cleared regularly.  Use sand or salt on walking paths during winter.  Clean up any spills in your garage right away. This includes oil or grease spills. What can I do in the  bathroom?  Use night lights.  Install grab bars by the toilet and in the tub and shower. Do not use towel bars as grab bars.  Use non-skid mats or decals in the tub or shower.  If you need to sit down in the shower, use a plastic, non-slip stool.  Keep the floor dry. Clean up any water that spills on the floor as soon as it happens.  Remove soap buildup in the tub or shower regularly.  Attach bath mats securely with double-sided non-slip rug tape.  Do not have throw rugs and other things on the floor that can make you trip. What can I do in the bedroom?  Use night lights.  Make sure that you have a light by your bed that is easy to reach.  Do not use any sheets or blankets that are too big for your bed. They should not hang down onto the floor.  Have a firm chair that has side arms. You can use this for support while you get dressed.  Do not have throw rugs and other things on the floor that can make you trip. What can I do in the kitchen?  Clean up any spills right away.  Avoid walking on wet floors.  Keep items that you use a lot in easy-to-reach places.  If you need to reach something above you, use a strong step stool that has a grab bar.  Keep electrical cords out of the way.  Do not use floor polish or wax that makes floors slippery. If you must use wax, use non-skid floor wax.  Do not have throw rugs and other things on the floor that can make you trip. What can I do with my stairs?  Do not leave any items on the stairs.  Make sure that there are handrails on both sides of the stairs and use them. Fix handrails that are broken or loose. Make sure that handrails are as long as the stairways.  Check any carpeting to make sure that it is firmly attached to the stairs. Fix any carpet that is loose or worn.  Avoid having throw rugs at the top or bottom of the stairs. If you do have throw rugs, attach them to the floor with carpet tape.  Make sure that you have a  light switch at the top of the stairs and the bottom of the stairs. If you do not have them, ask someone to add them for you. What else can I do to help prevent falls?  Wear shoes that:  Do not have high heels.  Have rubber bottoms.  Are comfortable and fit you well.  Are closed at the toe. Do not wear sandals.  If you use a stepladder:  Make sure that it is fully opened. Do not climb a closed stepladder.  Make sure that both sides of the stepladder are locked into place.  Ask someone to hold it for you, if possible.  Clearly mark and make sure that you can see:  Any grab bars or handrails.  First and last steps.  Where the edge of each step is.  Use tools that help you move around (mobility aids) if they are needed. These include:  Canes.  Walkers.  Scooters.  Crutches.  Turn on the lights when you go into a dark area. Replace any light bulbs as soon as they burn out.  Set up your furniture so you have a clear path. Avoid moving your furniture around.  If any of your floors are uneven, fix them.  If there are any pets around you, be aware of where they are.  Review your medicines with your doctor. Some medicines can make you feel dizzy. This can increase your chance of falling. Ask your doctor what other things that you can do to help prevent falls. This information is not intended to replace advice given to you by your health care provider. Make sure you discuss any questions you have with your health care provider. Document Released: 09/02/2009 Document Revised: 04/13/2016 Document Reviewed: 12/11/2014 Elsevier Interactive Patient Education  2017 [employer].

## 2021-03-03 NOTE — Progress Notes (Signed)
PCP notes:  Health Maintenance: Covid booster- due   Abnormal Screenings: none   Patient concerns: Discuss knee issue Discuss issues with depression/anxiety   Nurse concerns: none   Next PCP appt.: 04/01/2021 @ 2 pm

## 2021-03-03 NOTE — Progress Notes (Signed)
Subjective:   Kristy [name] is a 80 y.o. female who presents for Medicare Annual (Subsequent) preventive examination.  Review of Systems: N/A        I connected with the patient today by telephone and verified that I am speaking with the correct person using two identifiers. Location patient: home Location nurse: work Persons participating in the telephone visit: patient, nurse.   I discussed the limitations, risks, security and privacy concerns of performing an evaluation and management service by telephone and the availability of in person appointments. I also discussed with the patient that there may be a patient responsible charge related to this service. The patient expressed understanding and verbally consented to this telephonic visit.         Cardiac Risk Factors include: advanced age (>69men, >37 women);hypertension;Other (see comment), Risk factor comments: hyperlipidemia     Objective:    Today's Vitals   There is no height or weight on file to calculate BMI.  Advanced Directives 03/03/2021 02/08/2021 01/11/2021 01/04/2021 12/16/2020 12/10/2020 12/09/2020  Does Patient Have a Medical Advance Directive? No No No No No No No  Type of Advance Directive - - - - - - -  Copy of Healthcare Power of Attorney in Chart? - - - - - - -  Would patient like information on creating a medical advance directive? No - Patient declined No - Patient declined No - Patient declined - No - Patient declined No - Patient declined No - Patient declined    Current Medications (verified) Outpatient Encounter Medications as of 03/03/2021  Medication Sig  . atorvastatin (LIPITOR) 20 MG tablet TAKE 1 TABLET BY MOUTH  DAILY  . HYDROcodone-acetaminophen (NORCO/VICODIN) 5-325 MG tablet Take 1 tablet by mouth every 6 (six) hours as needed for moderate pain.  Marland Kitchen HYDROcodone-acetaminophen (NORCO/VICODIN) 5-325 MG tablet Take 1 tablet by mouth every 6 (six) hours as needed for moderate pain.  .  metoprolol succinate (TOPROL-XL) 100 MG 24 hr tablet TAKE 1 TABLET BY MOUTH  DAILY WITH OR IMMEDIATELY  FOLLOWING A MEAL  . Multiple Vitamin (MULTIVITAMIN WITH MINERALS) TABS tablet Take 1 tablet by mouth daily.  Marland Kitchen NIFEdipine (PROCARDIA XL/NIFEDICAL XL) 60 MG 24 hr tablet TAKE 1 TABLET BY MOUTH  DAILY  . triamterene-hydrochlorothiazide (MAXZIDE-25) 37.5-25 MG tablet Take 1 tablet by mouth daily.  . valACYclovir (VALTREX) 500 MG tablet Take 1 tablet (500 mg total) by mouth daily. Take daily for suppressive therapy   No facility-administered encounter medications on file as of 03/03/2021.    Allergies (verified) Vibramycin [doxycycline]   History: Past Medical History:  Diagnosis Date  . Allergy   . 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 Hulda Humphrey of vascular surgery in [place]  . Arthritis    rt knee, foot  . 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 Cataract    forming right eye   . 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 Constipation    uses stool softener PRN- uses once a week to once every 2 weeks   . Fibromuscular dysplasia ([place])   . Gallbladder disease   . GERD (gastroesophageal reflux disease)   . Hyperlipidemia   . Hypertension    For 16 years; urinary catecholamines within normal limits 12/09; renal Doppler ultrasound showed no evidence for renal artery stenosis  . Normal echocardiogram 4/10   LVEF 65%; no regional  wall motion abnormalities; normal RV size and function; pulmonic valve had increased gradient across w/ peak gradient of about 36 mmHg (range of moderate pulmonic stenosis) followup showed normal valve  . Papilloma of breast    right  . Tinnitus of left ear    Past Surgical History:  Procedure Laterality Date  . ANGIOPLASTY     2012  . BREAST BIOPSY Right 2006   benign  . BREAST EXCISIONAL BIOPSY Right 07/2017   benign  . BREAST  LUMPECTOMY WITH RADIOACTIVE SEED LOCALIZATION Right 08/14/2017   Procedure: RIGHT BREAST LUMPECTOMY WITH RADIOACTIVE SEED LOCALIZATION;  Surgeon: [name], MD;  Location: [facility];  Service: General;  Laterality: Right;  . BREAST LUMPECTOMY WITH RADIOACTIVE SEED LOCALIZATION Right 12/16/2020   Procedure: RADIOACTIVE SEED GUIDED TIMES 3 RIGHT BREAST LUMPECTOMY;  Surgeon: [name], MD;  Location: [facility];  Service: General;  Laterality: Right;  . Cardiolyte  11/07   Neg  . COLONOSCOPY    . KNEE CARTILAGE SURGERY     right knee  . POLYPECTOMY    . RE-EXCISION OF BREAST LUMPECTOMY Right 01/11/2021   Procedure: RE-EXCISION OF RIGHT BREAST LUMPECTOMY;  Surgeon: [name], MD;  Location: [facility];  Service: General;  Laterality: Right;  . TOTAL ABDOMINAL HYSTERECTOMY     no cervix  . UPPER GASTROINTESTINAL ENDOSCOPY     Family History  Problem Relation Age of Onset  . Prostate cancer Father   . Lung cancer Father   . Colon cancer Maternal Uncle   . Colon cancer Paternal Uncle   . Colon polyps Sister   . Coronary artery disease Paternal Grandmother   . Goiter Other        ?  . Esophageal cancer Neg Hx   . Stomach cancer Neg Hx   . Rectal cancer Neg Hx   . Bladder Cancer Neg Hx   . Kidney cancer Neg Hx    Social History   Socioeconomic History  . Marital status: Widowed    Spouse name: Not on file  . Number of children: 0  . Years of education: Not on file  . Highest education level: Not on file  Occupational History  . Occupation: Scientific laboratory technician: RETIRED    Comment: Retired  Tobacco Use  . Smoking status: Former Smoker    Quit date: 11/20/1982    Years since quitting: 38.3  . Smokeless tobacco: Never Used  . Tobacco comment: 15 pack year history  Vaping Use  . Vaping Use: Never used  Substance and Sexual Activity  . Alcohol use: No  . Drug use: No  . Sexual activity: Not on file  Other  Topics Concern  . Not on file  Social History Narrative   Widowed in 2007-spouse died from colon cancer.   Regular exercise at [facility].   Diet consists of fruits and veggies, snacks a lot.       Social Determinants of Health   Financial Resource Strain: Low Risk   . Difficulty of Paying Living Expenses: Not hard at all  Food Insecurity: No Food Insecurity  . Worried About Charity fundraiser in the Last Year: Never true  . Ran Out of Food in the Last Year: Never true  Transportation Needs: No Transportation Needs  . Lack of Transportation (Medical): No  . Lack of Transportation (Non-Medical): No  Physical Activity: Inactive  . Days of Exercise per Week: 0 days  . Minutes  of Exercise per Session: 0 min  Stress: Stress Concern Present  . Feeling of Stress : To some extent  Social Connections: Not on file    Tobacco Counseling Counseling given: Not Answered Comment: 15 pack year history   Clinical Intake:  Pre-visit preparation completed: Yes  Pain : No/denies pain     Nutritional Risks: Nausea/ vomitting/ diarrhea (sometimes) Diabetes: No  How often do you need to have someone help you when you read instructions, pamphlets, or other written materials from your doctor or pharmacy?: 1 - Never What is the last grade level you completed in school?: 1 year college  Diabetic: No Nutrition Risk Assessment:  Has the patient had any N/V/D within the last 2 months?  Yes  Does the patient have any non-healing wounds?  No  Has the patient had any unintentional weight loss or weight gain?  No   Diabetes:  Is the patient diabetic?  No  If diabetic, was a CBG obtained today?  N/A Did the patient bring in their glucometer from home?  N/A How often do you monitor your CBG's? N/A.   Financial Strains and Diabetes Management:  Are you having any financial strains with the device, your supplies or your medication? N/A.  Does the patient want to be seen by Chronic Care Management  for management of their diabetes?  N/A Would the patient like to be referred to a Nutritionist or for Diabetic Management?  N/A Interpreter Needed?: No  Information entered by :: CJohnson, LPN   Activities of Daily Living In your present state of health, do you have any difficulty performing the following activities: 03/03/2021 01/11/2021  Hearing? Y N  Vision? N N  Difficulty concentrating or making decisions? N N  Walking or climbing stairs? N N  Dressing or bathing? N N  Doing errands, shopping? N -  Preparing Food and eating ? N -  Using the Toilet? N -  In the past six months, have you accidently leaked urine? N -  Do you have problems with loss of bowel control? N -  Managing your Medications? N -  Managing your Finances? N -  Housekeeping or managing your Housekeeping? N -  Some recent data might be hidden    Patient Care Team: Jinny Sanders, MD as PCP - General [name], MD as Consulting Physician (General Surgery) Magrinat, Virgie Dad, MD as Consulting Physician (Oncology) Kyung Rudd, MD as Consulting Physician (Radiation Oncology) Ladene Artist, MD as Consulting Physician (Gastroenterology) Mauro Kaufmann, RN as Oncology Nurse Navigator Rockwell Germany, RN as Oncology Nurse Navigator  Indicate any recent Medical Services you may have received from other than [facility] providers in the past year (date may be approximate).     Assessment:   This is a routine wellness examination for [place].  Hearing/Vision screen  Hearing Screening   125Hz  250Hz  500Hz  1000Hz  2000Hz  3000Hz  4000Hz  6000Hz  8000Hz   Right ear:           Left ear:           Vision Screening Comments: Patient gets annual eye exams   Dietary issues and exercise activities discussed: Current Exercise Habits: Home exercise routine, Type of exercise: walking, Time (Minutes): 20, Frequency (Times/Week): 7, Weekly Exercise (Minutes/Week): 140, Intensity: Mild, Exercise limited by: None  identified  Goals    . Increase physical activity     Starting 11/05/2018, I will continue to walk at least 5000 steps daily.     . Patient  Stated     11/18/2019, I will try to lose about 10 lbs and get to 160 lbs.     . Patient Stated     03/03/2021, I will continue to walk 5,000-6,000 steps daily.      Depression Screen PHQ 2/9 Scores 03/03/2021 11/18/2019 11/05/2018 10/05/2017 09/04/2017 06/01/2016 05/20/2015  PHQ - 2 Score 4 0 0 0 0 0 0  PHQ- 9 Score 4 0 0 - 2 - -    Fall Risk Fall Risk  03/03/2021 11/18/2019 11/05/2018 10/05/2017 09/04/2017  Falls in the past year? 0 0 0 No No  Number falls in past yr: 0 0 - - -  Injury with Fall? 0 0 - - -  Risk for fall due to : Medication side effect Medication side effect - - -  Follow up Falls evaluation completed;Falls prevention discussed Falls evaluation completed;Falls prevention discussed - - -    FALL RISK PREVENTION PERTAINING TO THE HOME:  Any stairs in or around the home? Yes  If so, are there any without handrails? No  Home free of loose throw rugs in walkways, pet beds, electrical cords, etc? Yes  Adequate lighting in your home to reduce risk of falls? Yes   ASSISTIVE DEVICES UTILIZED TO PREVENT FALLS:  Life alert? No  Use of a cane, walker or w/c? No  Grab bars in the bathroom? No  Shower chair or bench in shower? No  Elevated toilet seat or a handicapped toilet? No   TIMED UP AND GO:  Was the test performed? N/A virtual/telephone visit .   Cognitive Function: MMSE - Mini Mental State Exam 03/03/2021 11/18/2019 11/05/2018 09/04/2017  Orientation to time 5 5 5 5   Orientation to Place 5 5 5 5   Registration 3 3 3 3   Attention/ Calculation 5 5 0 0  Recall 3 3 3 3   Language- name 2 objects - - 0 0  Language- repeat 1 1 1 1   Language- follow 3 step command - - 3 3  Language- read & follow direction - - 0 0  Write a sentence - - 0 0  Copy design - - 0 0  Total score - - 20 20  Mini Cog  Mini-Cog screen was  completed. Maximum score is 22. A value of 0 denotes this part of the MMSE was not completed or the patient failed this part of the Mini-Cog screening.       Immunizations Immunization History  Administered Date(s) Administered  . Fluad Quad(high Dose 65+) 07/24/2019, 09/11/2020  . Influenza Split 10/03/2011, 08/30/2012  . Influenza Whole 08/28/2007, 08/19/2008, 08/12/2009, 09/14/2010  . Influenza,inj,Quad PF,6+ Mos 08/13/2013, 08/18/2014, 09/03/2015, 08/03/2016, 09/04/2017, 09/05/2018  . PFIZER(Purple Top)SARS-COV-2 Vaccination 12/24/2019, 01/14/2020, 08/22/2020  . Pneumococcal Conjugate-13 01/09/2014  . Pneumococcal Polysaccharide-23 04/20/2006, 09/04/2017  . Td 04/20/2006    TDAP status: Due, Education has been provided regarding the importance of this vaccine. Advised may receive this vaccine at local pharmacy or Health Dept. Aware to provide a copy of the vaccination record if obtained from local pharmacy or Health Dept. Verbalized acceptance and understanding.  Flu Vaccine status: Up to date  Pneumococcal vaccine status: Up to date  Covid-19 vaccine status: completed 3 doses, fourth dose due, Patient will talk with oncology first to make sure it is safe for her to get  Qualifies for Shingles Vaccine? Yes   Zostavax completed No   Shingrix Completed?: No.    Education has been provided regarding the importance of this vaccine. Patient  has been advised to call insurance company to determine out of pocket expense if they have not yet received this vaccine. Advised may also receive vaccine at local pharmacy or Health Dept. Verbalized acceptance and understanding.  Screening Tests Health Maintenance  Topic Date Due  . COVID-19 Vaccine (4 - Booster for Pfizer series) 02/20/2021  . TETANUS/TDAP  04/19/2026 (Originally 04/20/2016)  . COLONOSCOPY (Pts 45-40yrs Insurance coverage will need to be confirmed)  05/14/2021  . INFLUENZA VACCINE  06/20/2021  . MAMMOGRAM  10/29/2021  . DEXA  SCAN  Completed  . Hepatitis C Screening  Completed  . PNA vac Low Risk Adult  Completed  . HPV VACCINES  Aged Out    Health Maintenance  Health Maintenance Due  Topic Date Due  . COVID-19 Vaccine (4 - Booster for Pfizer series) 02/20/2021    Colorectal cancer screening: Type of screening: Colonoscopy. Completed 05/15/2019. Repeat every 2 years  Mammogram status: Completed 10/29/2020. Repeat every year  Bone Density status: Completed 09/15/2019. Results reflect: Bone density results: OSTEOPENIA. Repeat every 2 years.  Lung Cancer Screening: (Low Dose CT Chest recommended if Age 61-80 years, 30 pack-year currently smoking OR have quit w/in 15 years.) does not qualify.    Additional Screening:  Hepatitis C Screening: does qualify; Completed 08/24/2016  Vision Screening: Recommended annual ophthalmology exams for early detection of glaucoma and other disorders of the eye. Is the patient up to date with their annual eye exam?  Yes  Who is the provider or what is the name of the office in which the patient attends annual eye exams? [facility] If pt is not established with a provider, would they like to be referred to a provider to establish care? No .   Dental Screening: Recommended annual dental exams for proper oral hygiene  Community Resource Referral / Chronic Care Management: CRR required this visit?  No   CCM required this visit?  No      Plan:     I have personally reviewed and noted the following in the patient's chart:   . Medical and social history . Use of alcohol, tobacco or illicit drugs  . Current medications and supplements . Functional ability and status . Nutritional status . Physical activity . Advanced directives . List of other physicians . Hospitalizations, surgeries, and ER visits in previous 12 months . Vitals . Screenings to include cognitive, depression, and falls . Referrals and appointments  In addition, I have reviewed and  discussed with patient certain preventive protocols, quality metrics, and best practice recommendations. A written personalized care plan for preventive services as well as general preventive health recommendations were provided to patient.   Due to this being a /virtual/telephonic visit, the after visit summary with patients personalized plan was offered to patient via office or my-chart. Patient preferred to pick up at office at next visit or via mychart.   Andrez Grime, LPN   [id number]

## 2021-03-04 ENCOUNTER — Ambulatory Visit: Payer: Medicare Other | Admitting: Radiation Oncology

## 2021-03-04 ENCOUNTER — Ambulatory Visit
Admission: RE | Admit: 2021-03-04 | Discharge: 2021-03-04 | Disposition: A | Discharge status: Home / Self Care | Payer: Medicare Other | Source: Ambulatory Visit | Attending: Radiation Oncology | Admitting: Radiation Oncology

## 2021-03-04 DIAGNOSIS — Z510 Encounter for antineoplastic radiation therapy: Secondary | ICD-10-CM | POA: Diagnosis not present

## 2021-03-04 DIAGNOSIS — D0511 Intraductal carcinoma in situ of right breast: Secondary | ICD-10-CM | POA: Diagnosis not present

## 2021-03-07 ENCOUNTER — Ambulatory Visit
Admission: RE | Admit: 2021-03-07 | Discharge: 2021-03-07 | Disposition: A | Discharge status: Home / Self Care | Payer: Medicare Other | Source: Ambulatory Visit | Attending: Radiation Oncology | Admitting: Radiation Oncology

## 2021-03-07 ENCOUNTER — Other Ambulatory Visit: Payer: Self-pay

## 2021-03-07 DIAGNOSIS — Z510 Encounter for antineoplastic radiation therapy: Secondary | ICD-10-CM | POA: Diagnosis not present

## 2021-03-07 DIAGNOSIS — D0511 Intraductal carcinoma in situ of right breast: Secondary | ICD-10-CM | POA: Diagnosis not present

## 2021-03-08 ENCOUNTER — Ambulatory Visit
Admission: RE | Admit: 2021-03-08 | Discharge: 2021-03-08 | Disposition: A | Discharge status: Home / Self Care | Payer: Medicare Other | Source: Ambulatory Visit | Attending: Radiation Oncology | Admitting: Radiation Oncology

## 2021-03-08 DIAGNOSIS — D0511 Intraductal carcinoma in situ of right breast: Secondary | ICD-10-CM | POA: Diagnosis not present

## 2021-03-08 DIAGNOSIS — Z510 Encounter for antineoplastic radiation therapy: Secondary | ICD-10-CM | POA: Diagnosis not present

## 2021-03-09 ENCOUNTER — Other Ambulatory Visit: Payer: Self-pay

## 2021-03-09 ENCOUNTER — Ambulatory Visit
Admission: RE | Admit: 2021-03-09 | Discharge: 2021-03-09 | Disposition: A | Discharge status: Home / Self Care | Payer: Medicare Other | Source: Ambulatory Visit | Attending: Radiation Oncology | Admitting: Radiation Oncology

## 2021-03-09 DIAGNOSIS — D0511 Intraductal carcinoma in situ of right breast: Secondary | ICD-10-CM | POA: Diagnosis not present

## 2021-03-09 DIAGNOSIS — Z510 Encounter for antineoplastic radiation therapy: Secondary | ICD-10-CM | POA: Diagnosis not present

## 2021-03-10 ENCOUNTER — Ambulatory Visit
Admission: RE | Admit: 2021-03-10 | Discharge: 2021-03-10 | Disposition: A | Discharge status: Home / Self Care | Payer: Medicare Other | Source: Ambulatory Visit | Attending: Radiation Oncology | Admitting: Radiation Oncology

## 2021-03-10 DIAGNOSIS — Z510 Encounter for antineoplastic radiation therapy: Secondary | ICD-10-CM | POA: Diagnosis not present

## 2021-03-10 DIAGNOSIS — D0511 Intraductal carcinoma in situ of right breast: Secondary | ICD-10-CM | POA: Diagnosis not present

## 2021-03-11 ENCOUNTER — Ambulatory Visit
Admission: RE | Admit: 2021-03-11 | Discharge: 2021-03-11 | Disposition: A | Discharge status: Home / Self Care | Payer: Medicare Other | Source: Ambulatory Visit | Attending: Radiation Oncology | Admitting: Radiation Oncology

## 2021-03-11 ENCOUNTER — Other Ambulatory Visit: Payer: Self-pay

## 2021-03-11 DIAGNOSIS — D0511 Intraductal carcinoma in situ of right breast: Secondary | ICD-10-CM | POA: Diagnosis not present

## 2021-03-11 DIAGNOSIS — Z510 Encounter for antineoplastic radiation therapy: Secondary | ICD-10-CM | POA: Diagnosis not present

## 2021-03-14 ENCOUNTER — Other Ambulatory Visit: Payer: Self-pay

## 2021-03-14 ENCOUNTER — Encounter: Payer: Self-pay | Admitting: *Deleted

## 2021-03-14 ENCOUNTER — Ambulatory Visit
Admission: RE | Admit: 2021-03-14 | Discharge: 2021-03-14 | Disposition: A | Discharge status: Home / Self Care | Payer: Medicare Other | Source: Ambulatory Visit | Attending: Radiation Oncology | Admitting: Radiation Oncology

## 2021-03-14 DIAGNOSIS — Z510 Encounter for antineoplastic radiation therapy: Secondary | ICD-10-CM | POA: Diagnosis not present

## 2021-03-14 DIAGNOSIS — D0511 Intraductal carcinoma in situ of right breast: Secondary | ICD-10-CM | POA: Diagnosis not present

## 2021-03-15 ENCOUNTER — Encounter: Payer: Self-pay | Admitting: Radiation Oncology

## 2021-03-15 ENCOUNTER — Ambulatory Visit
Admission: RE | Admit: 2021-03-15 | Discharge: 2021-03-15 | Disposition: A | Discharge status: Home / Self Care | Payer: Medicare Other | Source: Ambulatory Visit | Attending: Radiation Oncology | Admitting: Radiation Oncology

## 2021-03-15 DIAGNOSIS — Z510 Encounter for antineoplastic radiation therapy: Secondary | ICD-10-CM | POA: Diagnosis not present

## 2021-03-15 DIAGNOSIS — D0511 Intraductal carcinoma in situ of right breast: Secondary | ICD-10-CM | POA: Diagnosis not present

## 2021-03-18 ENCOUNTER — Telehealth: Payer: Self-pay | Admitting: Family Medicine

## 2021-03-18 DIAGNOSIS — R7303 Prediabetes: Secondary | ICD-10-CM

## 2021-03-18 DIAGNOSIS — E782 Mixed hyperlipidemia: Secondary | ICD-10-CM

## 2021-03-18 NOTE — Telephone Encounter (Signed)
-----   Message from Ellamae Sia sent at 03/08/2021  4:27 PM EDT ----- Regarding: lab orders for Friday, 5.6.22 Patient is scheduled for CPX labs, please order future labs, Thanks , Karna Christmas

## 2021-03-22 ENCOUNTER — Inpatient Hospital Stay: Payer: Medicare Other | Attending: Oncology | Admitting: Oncology

## 2021-03-22 ENCOUNTER — Other Ambulatory Visit: Payer: Self-pay

## 2021-03-22 ENCOUNTER — Telehealth: Payer: Self-pay | Admitting: *Deleted

## 2021-03-22 VITALS — BP 149/72 | HR 69 | Temp 98.0°F | Resp 18 | Ht 62.0 in | Wt 171.0 lb

## 2021-03-22 DIAGNOSIS — D0511 Intraductal carcinoma in situ of right breast: Secondary | ICD-10-CM | POA: Diagnosis not present

## 2021-03-22 DIAGNOSIS — M818 Other osteoporosis without current pathological fracture: Secondary | ICD-10-CM | POA: Diagnosis not present

## 2021-03-22 DIAGNOSIS — Z170 Estrogen receptor positive status [ER+]: Secondary | ICD-10-CM | POA: Insufficient documentation

## 2021-03-22 DIAGNOSIS — Z79811 Long term (current) use of aromatase inhibitors: Secondary | ICD-10-CM | POA: Insufficient documentation

## 2021-03-22 MED ORDER — TAMOXIFEN CITRATE 20 MG PO TABS: 20.0000 mg | ORAL_TABLET | Freq: Every day | ORAL | 12 refills | Status: AC

## 2021-03-22 NOTE — Telephone Encounter (Signed)
Left message for the patient to let her know she can use neosporin with pain relief for the tender areas of her skin due to radiation therapy.  Call back number [phone]) left for further questions or concerns.  Kristy [name]. Leonie Green, BSN

## 2021-03-22 NOTE — Progress Notes (Signed)
[place]  [phone]) [phone] [phone]) [phone]     ID: Kristy [name] DOB: 04-Sep-1941  MR#: [id number]  KWI#:[id number]  Patient Care Team: Jinny Sanders, MD as PCP - General [name], MD as Consulting Physician (General Surgery) Bennet Kujawa, Virgie Dad, MD as Consulting Physician (Oncology) Kyung Rudd, MD as Consulting Physician (Radiation Oncology) Ladene Artist, MD as Consulting Physician (Gastroenterology) Mauro Kaufmann, RN as Oncology Nurse Navigator Rockwell Germany, RN as Oncology Nurse Navigator Chauncey Cruel, MD OTHER MD:  CHIEF COMPLAINT: noninvasive breast cancer, estrogen receptor positive  CURRENT TREATMENT: Tamoxifen   INTERVAL HISTORY: Kristy [name] returns today for follow up of her noninvasive breast cancer. She was evaluated in the breast cancer clinic on 12/27/2020.  Since her last visit, she underwent re-excision of the positive margin on 01/11/2021 under Dr. Brantley Stage. Pathology from the procedure ([id number]) showed no residual DCIS.  She was referred back to Dr. Lisbeth Renshaw on 02/08/2021 to review radiation therapy. She subsequently received treatment from 02/16/2021 through 03/15/2021.   REVIEW OF SYSTEMS: Kristy [name] did generally well with her treatments, with no significant peeling.  She did have some decrease in energy which is improving.  She has discomfort in the armpit crease on the right and some soreness over the nipple.  For exercise she does about 5 km daily.  She is currently suffering from the pollen abundance.  A detailed review of systems was otherwise stable   COVID 19 VACCINATION STATUS: fully vaccinated [employer]), with booster 08/2020   HISTORY OF CURRENT ILLNESS: Kristy [name] has a history of right breast lumpectomy on 08/14/2017 under Dr. Brantley Stage for a tubular adenoma with calcifications.  More recently, she had routine screening mammography on 10/11/2020 showing a possible abnormality in the right breast. She  underwent right diagnostic mammography with tomography at [facility] on 10/29/2020 showing: breast density category C; indeterminate 1.1 cm group of fine pleomorphic calcifications in lower-outer right breast.  Accordingly on 11/09/2020 she proceeded to biopsy of the right breast area in question. The pathology from this procedure ([id number]) showed: ductal carcinoma in situ, high grade, with necrosis and calcifications; complex sclerosing lesion. Prognostic indicators significant for: estrogen receptor, 95% positive with strong staining intensity and progesterone receptor, 0% negative.   She underwent breast MRI on 11/15/2020 showing: breast composition C; enhancement around a small hematoma and biopsy tract in lower-outer right breast consistent with patient's known malignancy; indeterminate 6 mm enhancing mass with suspicious kinetics, just superior and posterior to biopsy site in lower-outer right breast; indeterminate 1.8 cm area of linear non-mass enhancement in superior central right breast; no evidence of malignancy in left breast or bilateral lymph nodes.  She proceeded to additional lower-outer right breast biopsy on 11/23/2020. Pathology [id number]) again showed ductal carcinoma in situ, high grade, with necrosis and calcifications. Prognostic panel showing: estrogen receptor 100% positive with strong staining intensity; progesterone receptor 0% negative.  Two additional right breast biopsies were obtained on 11/29/2020. Pathology [id number]) showed: 1. Right Breast, upper central  - ductal carcinoma in situ with necrosis and calcifications 2. 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 Brantley Stage. Pathology from the procedure ([id number]) showed: multifocal intermediate to  high grade ductal carcinoma in situ; carcinoma present at cauterized inferior margin; complex sclerosing lesion.  Cancer Staging Ductal carcinoma in  situ (DCIS) of right breast Staging form: Breast, AJCC 7th Edition - Pathologic: Stage 0 (Tis (DCIS), N0, cM0) - Signed by Chauncey Cruel, MD on 12/27/2020 Histologic grade (G): G3 Estrogen receptor status: Positive Progesterone receptor status: Negative   The patient's subsequent history is as detailed below.   PAST MEDICAL HISTORY: Past Medical History:  Diagnosis Date  . Allergy   . 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 Hulda Humphrey of vascular surgery in [facility]  . Arthritis    rt knee, foot  . Breast cancer ([place]) 11/29/2020   right breast DCIS  . Cataract    forming right eye   . 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 Constipation    uses stool softener PRN- uses once a week to once every 2 weeks   . Fibromuscular dysplasia ([place])   . Gallbladder disease   . GERD (gastroesophageal reflux disease)   . Hyperlipidemia   . Hypertension    For 16 years; urinary catecholamines within normal limits 12/09; renal Doppler ultrasound showed no evidence for renal artery stenosis  . Normal echocardiogram 4/10   LVEF 65%; no regional wall motion abnormalities; normal RV size and function; pulmonic valve had increased gradient across w/ peak gradient of about 36 mmHg (range of moderate pulmonic stenosis) followup showed normal valve  . Papilloma of breast    right  . Tinnitus of left ear     PAST SURGICAL HISTORY: Past Surgical History:  Procedure Laterality Date  . ANGIOPLASTY     2012  . BREAST BIOPSY Right 2006   benign  . BREAST EXCISIONAL BIOPSY Right 07/2017   benign  . BREAST LUMPECTOMY WITH RADIOACTIVE SEED LOCALIZATION Right 08/14/2017   Procedure: RIGHT BREAST LUMPECTOMY WITH RADIOACTIVE SEED LOCALIZATION;  Surgeon:  [name], MD;  Location: [facility];  Service: General;  Laterality: Right;  . BREAST LUMPECTOMY WITH RADIOACTIVE SEED LOCALIZATION Right 12/16/2020   Procedure: RADIOACTIVE SEED GUIDED TIMES 3 RIGHT BREAST LUMPECTOMY;  Surgeon: [name], MD;  Location: [facility];  Service: General;  Laterality: Right;  . Cardiolyte  11/07   Neg  . COLONOSCOPY    . KNEE CARTILAGE SURGERY     right knee  . POLYPECTOMY    . RE-EXCISION OF BREAST LUMPECTOMY Right 01/11/2021   Procedure: RE-EXCISION OF RIGHT BREAST LUMPECTOMY;  Surgeon: [name], MD;  Location: [facility];  Service: General;  Laterality: Right;  . TOTAL ABDOMINAL HYSTERECTOMY     no cervix  . UPPER GASTROINTESTINAL ENDOSCOPY      FAMILY HISTORY: Family History  Problem Relation Age of Onset  . Prostate cancer Father   . Lung cancer Father   . Colon cancer Maternal Uncle   . Colon cancer Paternal Uncle   . Colon polyps Sister   . Coronary artery disease Paternal Grandmother   . Goiter Other        ?  . Esophageal cancer Neg Hx   . Stomach cancer Neg Hx   . Rectal cancer Neg Hx   . Bladder Cancer Neg Hx   . Kidney cancer Neg Hx   The patient's father died at the age of 21 from lung cancer.  The patient's mother died from amyotrophic lateral sclerosis at age 69.  The patient has 1 brother and 1 sister.  There is no other family history of cancer to her  knowledge   GYNECOLOGIC HISTORY:  No LMP recorded. Patient has had a hysterectomy. Menarche: 80 years old [place] P 0 LMP at the time of hysterectomy in 1993, for fibroids contraceptive no HRT yes, about 4 years  Hysterectomy? yes BSO?  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  Her husband died from colon cancer at age 19.  At [facility] she lives with Pixie Casino, her significant other, who is  a retired Administrator.   ADVANCED DIRECTIVES: Not in place; the patient is considering naming her niece Bayard Males, a retired Marine scientist, as her healthcare power of [place]: Social History   Tobacco Use  . Smoking status: Former Smoker    Quit date: 11/20/1982    Years since quitting: 38.3  . Smokeless tobacco: Never Used  . Tobacco comment: 15 pack year history  Vaping Use  . Vaping Use: Never used  Substance Use Topics  . Alcohol use: No  . Drug use: No     Colonoscopy: 04/2019 (Dr. Fuller Plan), recall 2022  PAP: Status post hysterectomy  Bone density: 08/2019, -2.0   Allergies  Allergen Reactions  . Vibramycin [Doxycycline]     Current Outpatient Medications  Medication Sig Dispense Refill  . tamoxifen (NOLVADEX) 20 MG tablet Take 1 tablet (20 mg total) by mouth daily. 90 tablet 12  . atorvastatin (LIPITOR) 20 MG tablet TAKE 1 TABLET BY MOUTH  DAILY 90 tablet 0  . HYDROcodone-acetaminophen (NORCO/VICODIN) 5-325 MG tablet Take 1 tablet by mouth every 6 (six) hours as needed for moderate pain. 15 tablet 0  . HYDROcodone-acetaminophen (NORCO/VICODIN) 5-325 MG tablet Take 1 tablet by mouth every 6 (six) hours as needed for moderate pain. 15 tablet 0  . metoprolol succinate (TOPROL-XL) 100 MG 24 hr tablet TAKE 1 TABLET BY MOUTH  DAILY WITH OR IMMEDIATELY  FOLLOWING A MEAL 90 tablet 0  . Multiple Vitamin (MULTIVITAMIN WITH MINERALS) TABS tablet Take 1 tablet by mouth daily.    Marland Kitchen NIFEdipine (PROCARDIA XL/NIFEDICAL XL) 60 MG 24 hr tablet TAKE 1 TABLET BY MOUTH  DAILY 90 tablet 0  . triamterene-hydrochlorothiazide (MAXZIDE-25) 37.5-25 MG tablet Take 1 tablet by mouth daily. 90 tablet 2  . valACYclovir (VALTREX) 500 MG tablet Take 1 tablet (500 mg total) by mouth daily. Take daily for suppressive therapy 30 tablet 11   No current facility-administered medications for this visit.    OBJECTIVE: African-American woman who appears younger than stated age  60:    03/22/21 1426  BP: (!) 149/72  Pulse: 69  Resp: 18  Temp: 98 F (36.7 C)  SpO2: 100%     Body mass index is 31.28 kg/m.   Wt Readings from Last 3 Encounters:  03/22/21 171 lb (77.6 kg)  02/08/21 175 lb 12.8 oz (79.7 kg)  01/11/21 175 lb 0.7 oz (79.4 kg)      ECOG FS:1 - Symptomatic but completely ambulatory  Sclerae unicteric, EOMs intact Wearing a mask No cervical or supraclavicular adenopathy Lungs no rales or rhonchi Heart regular rate and rhythm Abd soft, nontender, positive bowel sounds MSK no focal spinal tenderness, no upper extremity lymphedema Neuro: nonfocal, well oriented, appropriate affect Breasts: The right breast is status post lumpectomy and radiation.  The cosmetic result is excellent.  There is some hyperpigmentation.  There is no residual desquamation.  The left breast and both axillae are benign.  LAB RESULTS:  CMP     Component Value Date/Time   NA 137 01/07/2021 1513   K 4.5 01/07/2021 1513   CL 105 01/07/2021 1513   CO2 24 01/07/2021 1513   GLUCOSE 106 (H) 01/07/2021 1513   BUN 11 01/07/2021 1513   CREATININE 0.93 01/07/2021 1513   CALCIUM 9.7 01/07/2021 1513   PROT 7.2 12/13/2020 1231   ALBUMIN 3.8 12/13/2020 1231   AST 19 12/13/2020 1231   ALT 17 12/13/2020 1231   ALKPHOS 68 12/13/2020 1231   BILITOT 0.9 12/13/2020 1231   GFRNONAA >60 01/07/2021 1513   GFRAA >60 08/09/2017 1300    No results found for: TOTALPROTELP, ALBUMINELP, A1GS, A2GS, BETS, BETA2SER, GAMS, MSPIKE, SPEI  Lab Results  Component Value Date   WBC 8.0 12/13/2020   NEUTROABS 3.4 12/13/2020   HGB 13.6 12/13/2020   HCT 41.1 12/13/2020   MCV 96.5 12/13/2020   PLT 359 12/13/2020    No results found for: LABCA2  No components found for: [id number]  No results for input(s): INR in the last 168 hours.  No results found for: LABCA2  No results found for: GYI948  No results found for: NIO270  No results found for: JJK093  No results found for: CA2729  No  components found for: HGQUANT  No results found for: CEA1 / No results found for: CEA1   No results found for: AFPTUMOR  No results found for: CHROMOGRNA  No results found for: KPAFRELGTCHN, LAMBDASER, KAPLAMBRATIO (kappa/lambda light chains)  No results found for: HGBA, HGBA2QUANT, HGBFQUANT, HGBSQUAN (Hemoglobinopathy evaluation)   No results found for: LDH  No results found for: IRON, TIBC, IRONPCTSAT (Iron and TIBC)  No results found for: FERRITIN  Urinalysis    Component Value Date/Time   COLORURINE STRAW (A) 03/02/2017 1320   APPEARANCEUR Clear 08/05/2019 1555   LABSPEC 1.004 (L) 03/02/2017 1320   PHURINE 5.0 03/02/2017 1320   GLUCOSEU Negative 08/05/2019 1555   HGBUR MODERATE (A) 03/02/2017 1320   HGBUR large 11/03/2010 1548   BILIRUBINUR Negative 09/28/2020 1247   BILIRUBINUR Negative 08/05/2019 1555   KETONESUR NEGATIVE 03/02/2017 1320   PROTEINUR Negative 09/28/2020 1247   PROTEINUR Negative 08/05/2019 1555   PROTEINUR NEGATIVE 03/02/2017 1320   UROBILINOGEN 0.2 09/28/2020 1247   UROBILINOGEN 0.2 06/10/2011 1040   NITRITE Negative 09/28/2020 1247   NITRITE Negative 08/05/2019 1555   NITRITE NEGATIVE 03/02/2017 1320   LEUKOCYTESUR Negative 09/28/2020 1247   LEUKOCYTESUR Negative 08/05/2019 1555    STUDIES: No results found.   ELIGIBLE FOR AVAILABLE RESEARCH PROTOCOL:   ASSESSMENT: 80 y.o. [place] woman status post right lumpectomy 12/16/2020, for a multi centric ductal carcinoma in situ, grade 3, with a positive inferior lateral margin  (a) consider additional surgery for margin clearance  (1) adjuvant radiation completed 03/15/2021.  (2) tamoxifen started 03/22/2021  (a) status post hysterectomy  (b) bone density 09/15/2019 shows a T score of -2.0   PLAN: Kristy [name] did very well with her surgery and radiation and now has completed local treatment for her ductal carcinoma in situ.  The risk of local recurrence of this tumor is likely very low  and in itself may not warrant antiestrogen therapy.  On the other hand she is at risk for developing another breast cancer in the future.  That risk approaches 1 %/year.  Accordingly we discussed the difference between tamoxifen and anastrozole in detail. She understands that anastrozole and the aromatase inhibitors in general work by blocking estrogen production. Accordingly vaginal  dryness, decrease in bone density, and of course hot flashes can result. The aromatase inhibitors can also negatively affect the cholesterol profile, although that is a minor effect. One out of 5 women on aromatase inhibitors we will feel "old and achy". This arthralgia/myalgia syndrome, which resembles fibromyalgia clinically, does resolve with stopping the medications. Accordingly this is not a reason to not try an aromatase inhibitor but it is a frequent reason to stop it (in other words 20% of women will not be able to tolerate these medications).  Tamoxifen on the other hand does not block estrogen production. It does not "take away a woman's estrogen". It blocks the estrogen receptor in breast cells. Like anastrozole, it can also cause hot flashes. As opposed to anastrozole, tamoxifen has many estrogen-like effects. It is technically an estrogen receptor modulator. This means that in some tissues tamoxifen works like estrogen-- for example it helps strengthen the bones. It tends to improve the cholesterol profile. It can cause thickening of the endometrial lining, and even endometrial polyps or rarely cancer of the uterus.(The risk of uterine cancer due to tamoxifen is one additional cancer per thousand women year). It can cause vaginal wetness or stickiness. It can cause blood clots through this estrogen-like effect--the risk of blood clots with tamoxifen is exactly the same as with birth control pills or hormone replacement.  Neither of these agents causes mood changes or weight gain, despite the popular belief that they  can have these side effects. We have data from studies comparing either of these drugs with placebo, and in those cases the control group had the same amount of weight gain and depression as the group that took the drug.  After this discussion she is pretty clear that she would like to give tamoxifen a try and have written the appropriate prescription.  She will have a virtual visit with me in a few weeks to make sure she is tolerating it well and if so I will set her up for her new baseline mammogram sometime in the fall  She knows to call for any other issue that may develop before the next visit.  Total encounter time 25 minutes.Sarajane Jews C. Laronica Bhagat, MD 03/22/2021 6:12 PM Medical Oncology and Hematology [facility] [place], [place] [address] Tel. [phone]    Fax. [phone]   This document serves as a record of services personally performed by Lurline Del, MD. It was created on his behalf by Wilburn Mylar, a trained medical scribe. The creation of this record is based on the scribe's personal observations and the provider's statements to them.   I, Lurline Del MD, have reviewed the above documentation for accuracy and completeness, and I agree with the above.   *Total Encounter Time as defined by the Centers for Medicare and Medicaid Services includes, in addition to the face-to-face time of a patient visit (documented in the note above) non-face-to-face time: obtaining and reviewing outside history, ordering and reviewing medications, tests or procedures, care coordination (communications with other health care professionals or caregivers) and documentation in the medical record.

## 2021-03-24 NOTE — Progress Notes (Signed)
  Patient Name: Kristy [name] MRN: [id number] DOB: 1941-10-17 Referring Physician: Eliezer Lofts (Profile Not Attached) Date of Service: 03/15/2021 [facility], [place]                                                        End Of Treatment Note  Diagnoses: D05.11-Intraductal carcinoma in situ of right breast  Cancer Staging: High-grade ER positive DCIS of the right breast.  Intent: Curative  Radiation Treatment Dates: 02/16/2021 through 03/15/2021 Site Technique Total Dose (Gy) Dose per Fx (Gy) Completed Fx Beam Energies  Breast, Right: Breast_Rt 3D 42.56/42.56 2.66 16/16 6X  Breast, Right: Breast_Rt_Bst 3D 8/8 2 4/4 6X   Narrative: The patient tolerated radiation therapy relatively well. She did have anticipated symptoms of fatigue, skin discoloration and sensitivity during treatment.  Plan: The patient will receive a call in about one month from the radiation oncology department. She will continue follow up with Dr. Jana Hakim as well.  ________________________________________________    Carola Rhine, [facility]

## 2021-03-25 ENCOUNTER — Other Ambulatory Visit (INDEPENDENT_AMBULATORY_CARE_PROVIDER_SITE_OTHER): Payer: Medicare Other

## 2021-03-25 ENCOUNTER — Other Ambulatory Visit: Payer: Self-pay

## 2021-03-25 DIAGNOSIS — E782 Mixed hyperlipidemia: Secondary | ICD-10-CM | POA: Diagnosis not present

## 2021-03-25 DIAGNOSIS — R7303 Prediabetes: Secondary | ICD-10-CM | POA: Diagnosis not present

## 2021-03-25 LAB — COMPREHENSIVE METABOLIC PANEL
ALT: 15 U/L (ref 0–35)
AST: 18 U/L (ref 0–37)
Albumin: 4.4 g/dL (ref 3.5–5.2)
Alkaline Phosphatase: 68 U/L (ref 39–117)
BUN: 19 mg/dL (ref 6–23)
CO2: 28 mEq/L (ref 19–32)
Calcium: 10.1 mg/dL (ref 8.4–10.5)
Chloride: 104 mEq/L (ref 96–112)
Creatinine, Ser: 0.84 mg/dL (ref 0.40–1.20)
GFR: 66.84 mL/min (ref >60.00)
Glucose, Bld: 102 mg/dL — ABNORMAL HIGH (ref 70–99)
Potassium: 4 mEq/L (ref 3.5–5.1)
Sodium: 140 mEq/L (ref 135–145)
Total Bilirubin: 0.6 mg/dL (ref 0.2–1.2)
Total Protein: 7.6 g/dL (ref 6.0–8.3)

## 2021-03-25 LAB — LIPID PANEL
Cholesterol: 169 mg/dL (ref 0–200)
HDL: 51.3 mg/dL (ref >39.00)
LDL Cholesterol: 100 mg/dL — ABNORMAL HIGH (ref 0–99)
NonHDL: 117.96
Total CHOL/HDL Ratio: 3
Triglycerides: 90 mg/dL (ref 0.0–149.0)
VLDL: 18 mg/dL (ref 0.0–40.0)

## 2021-03-25 LAB — HEMOGLOBIN A1C: Hgb A1c MFr Bld: 6.1 % (ref 4.6–6.5)

## 2021-03-25 NOTE — Progress Notes (Signed)
No critical labs need to be addressed urgently. We will discuss labs in detail at upcoming office visit.   

## 2021-04-01 ENCOUNTER — Encounter: Payer: Self-pay | Admitting: Family Medicine

## 2021-04-01 ENCOUNTER — Ambulatory Visit (INDEPENDENT_AMBULATORY_CARE_PROVIDER_SITE_OTHER): Payer: Medicare Other | Admitting: Family Medicine

## 2021-04-01 ENCOUNTER — Other Ambulatory Visit: Payer: Self-pay

## 2021-04-01 VITALS — BP 108/70 | HR 72 | Temp 97.2°F | Ht 62.0 in | Wt 170.0 lb

## 2021-04-01 DIAGNOSIS — I6523 Occlusion and stenosis of bilateral carotid arteries: Secondary | ICD-10-CM | POA: Diagnosis not present

## 2021-04-01 DIAGNOSIS — I701 Atherosclerosis of renal artery: Secondary | ICD-10-CM

## 2021-04-01 DIAGNOSIS — I10 Essential (primary) hypertension: Secondary | ICD-10-CM

## 2021-04-01 DIAGNOSIS — D0511 Intraductal carcinoma in situ of right breast: Secondary | ICD-10-CM | POA: Diagnosis not present

## 2021-04-01 DIAGNOSIS — Z0000 Encounter for general adult medical examination without abnormal findings: Secondary | ICD-10-CM

## 2021-04-01 DIAGNOSIS — I773 Arterial fibromuscular dysplasia: Secondary | ICD-10-CM | POA: Diagnosis not present

## 2021-04-01 DIAGNOSIS — R7303 Prediabetes: Secondary | ICD-10-CM

## 2021-04-01 DIAGNOSIS — E782 Mixed hyperlipidemia: Secondary | ICD-10-CM | POA: Diagnosis not present

## 2021-04-01 NOTE — Assessment & Plan Note (Signed)
Stable, chronic.  Continue current medication.   ON nifedipine, maxide, metoprolol.

## 2021-04-01 NOTE — Progress Notes (Signed)
[name], female    DOB: 27-Nov-1940, 80 y.o.   MRN: [id number]  This visit was conducted in person.    CC:  Chief Complaint  Patient presents with  . Annual Exam     Subjective:   HPI: Kristy [name] is a 80 y.o. female presenting on 04/01/2021 for Annual Exam  The patient presents for  complete physical and review of chronic health problems. He/She also has the following acute concerns today:  The patient saw a LPN or RN for medicare wellness visit.  Prevention and wellness was reviewed in detail. Note reviewed and important notes copied below.  Health Maintenance: Covid booster- due   Abnormal Screenings: none   Patient concerns: Discuss knee issue Discuss issues with depression/anxiety   Advance directives and end of life planning reviewed in detail with patient and documented in EMR. Patient given handout on advance care directives if needed. HCPOA and living will updated if needed.  Fall Risk  03/03/2021 11/18/2019 11/05/2018 10/05/2017 09/04/2017  Falls in the past year? 0 0 0 No No  Number falls in past yr: 0 0 - - -  Injury with Fall? 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 on tamoxifem. Followed by D. Magrinot. Reviewed OV note from 03/22/2021 She underwent re-excision of the positive margin on 01/11/2021 under Dr. Brantley Stage. Pathology from the procedure ([id number]) showed no residual DCIS.  She was referred back to Dr. Lisbeth Renshaw on 02/08/2021 to review radiation therapy. She subsequently received treatment from 02/16/2021 through 03/15/2021.  Hypertension:  Good  control  On nifedipine, maxide and metoprolol BP Readings from Last 3 Encounters:  04/01/21 108/70  03/22/21 (!) 149/72  02/08/21 (!) 156/89  Using medication without problems or lightheadedness:  None Chest pain with exertion: none Edema:none Short of breath:none Average home BPs: 130/80 Other issues:  Elevated Cholesterol:  On atorvastatin 20 mg daily Lab Results  Component Value Date   CHOL 169 03/25/2021   HDL 51.30 03/25/2021   LDLCALC 100 (H) 03/25/2021   LDLDIRECT 140.3 03/26/2013   TRIG 90.0 03/25/2021   CHOLHDL 3 03/25/2021  Using medications without problems: Muscle aches:  Diet compliance: Exercise: Other complaints: prediabetes  Lab Results  Component Value Date   HGBA1C 6.1 03/25/2021     Fibromuscular dysplasia, renal stenosis and carotid stenosis followed by vascular. [place] of carotids and renal.. Every 2 years.. Dr> Dew Some change on kidney... followed by Dr. Holley Raring.     Relevant past medical, surgical, family and social history reviewed and updated as indicated. Interim medical history since our last visit reviewed. Allergies and medications reviewed and updated. Outpatient Medications Prior to Visit  Medication Sig Dispense Refill  . atorvastatin (LIPITOR) 20 MG tablet TAKE 1 TABLET BY MOUTH  DAILY 90 tablet 0  . HYDROcodone-acetaminophen (NORCO/VICODIN) 5-325 MG tablet Take 1 tablet by mouth every 6 (six) hours as needed for moderate pain. 15 tablet 0  . HYDROcodone-acetaminophen (NORCO/VICODIN) 5-325 MG tablet Take 1 tablet by mouth every 6 (six) hours as needed for moderate pain. 15 tablet 0  . metoprolol succinate (TOPROL-XL)  100 MG 24 hr tablet TAKE 1 TABLET BY MOUTH  DAILY WITH OR IMMEDIATELY  FOLLOWING A MEAL 90 tablet 0  . Multiple Vitamin (MULTIVITAMIN WITH MINERALS) TABS tablet Take 1 tablet by mouth daily.    Marland Kitchen NIFEdipine (PROCARDIA XL/NIFEDICAL XL) 60 MG 24 hr tablet TAKE 1 TABLET BY MOUTH  DAILY 90 tablet 0  . tamoxifen (NOLVADEX) 20 MG  tablet Take 1 tablet (20 mg total) by mouth daily. 90 tablet 12  . triamterene-hydrochlorothiazide (MAXZIDE-25) 37.5-25 MG tablet Take 1 tablet by mouth daily. 90 tablet 2  . valACYclovir (VALTREX) 500 MG tablet Take 1 tablet (500 mg total) by mouth daily. Take daily for suppressive therapy 30 tablet 11   No facility-administered medications prior to visit.     Per HPI unless specifically indicated in ROS section below Review of Systems  Constitutional: Negative for fatigue and fever.  HENT: Negative for congestion.   Eyes: Negative for pain.  Respiratory: Negative for cough and shortness of breath.   Cardiovascular: Negative for chest pain, palpitations and leg swelling.  Gastrointestinal: Negative for abdominal pain.  Genitourinary: Negative for dysuria and vaginal bleeding.  Musculoskeletal: Negative for back pain.  Neurological: Negative for syncope, light-headedness and headaches.  Psychiatric/Behavioral: Negative for dysphoric mood.   Objective:  There were no vitals taken for this visit.  Wt Readings from Last 3 Encounters:  03/22/21 171 lb (77.6 kg)  02/08/21 175 lb 12.8 oz (79.7 kg)  01/11/21 175 lb 0.7 oz (79.4 kg)      Physical Exam Constitutional:      General: She is not in acute distress.    Appearance: Normal appearance. She is well-developed. She is not ill-appearing or toxic-appearing.  HENT:     Head: Normocephalic.     Right Ear: Hearing, tympanic membrane, ear canal and external ear normal.     Left Ear: Hearing, tympanic membrane, ear canal and external ear normal.     Nose: Nose normal.  Eyes:     General: Lids are normal. Lids are everted, no foreign bodies appreciated.     Conjunctiva/sclera: Conjunctivae normal.     Pupils: Pupils are equal, round, and reactive to light.  Neck:     Thyroid: No thyroid mass or thyromegaly.     Vascular: No carotid bruit.     Trachea: Trachea normal.  Cardiovascular:     Rate and Rhythm: Normal rate and regular  rhythm.     Heart sounds: Normal heart sounds, S1 normal and S2 normal. No murmur heard. No gallop.   Pulmonary:     Effort: Pulmonary effort is normal. No respiratory distress.     Breath sounds: Normal breath sounds. No wheezing, rhonchi or rales.  Abdominal:     General: Bowel sounds are normal. There is no distension or abdominal bruit.     Palpations: Abdomen is soft. There is no fluid wave or mass.     Tenderness: There is no abdominal tenderness. There is no guarding or rebound.     Hernia: No hernia is present.  Musculoskeletal:     Cervical back: Normal range of motion and neck supple.  Lymphadenopathy:     Cervical: No cervical adenopathy.  Skin:    General: Skin is warm and dry.     Findings: No rash.  Neurological:     Mental Status: She is alert.     Cranial Nerves: No cranial nerve deficit.     Sensory: No sensory deficit.  Psychiatric:  Mood and Affect: Mood is not anxious or depressed.        Speech: Speech normal.        Behavior: Behavior normal. Behavior is cooperative.        Judgment: Judgment normal.       Vitals:   04/01/21 1408  BP: 108/70  Pulse: 72  Temp: (!) 97.2 F (36.2 C)  SpO2: 93%    Results for orders placed or performed in visit on 03/25/21  Comprehensive metabolic panel  Result Value Ref Range   Sodium 140 135 - 145 mEq/L   Potassium 4.0 3.5 - 5.1 mEq/L   Chloride 104 96 - 112 mEq/L   CO2 28 19 - 32 mEq/L   Glucose, Bld 102 (H) 70 - 99 mg/dL   BUN 19 6 - 23 mg/dL   Creatinine, Ser 0.84 0.40 - 1.20 mg/dL   Total Bilirubin 0.6 0.2 - 1.2 mg/dL   Alkaline Phosphatase 68 39 - 117 U/L   AST 18 0 - 37 U/L   ALT 15 0 - 35 U/L   Total Protein 7.6 6.0 - 8.3 g/dL   Albumin 4.4 3.5 - 5.2 g/dL   GFR 66.84 >60.00 mL/min   Calcium 10.1 8.4 - 10.5 mg/dL  Lipid panel  Result Value Ref Range   Cholesterol 169 0 - 200 mg/dL   Triglycerides 90.0 0.0 - 149.0 mg/dL   HDL 51.30 >39.00 mg/dL   VLDL 18.0 0.0 - 40.0 mg/dL   LDL Cholesterol  100 (H) 0 - 99 mg/dL   Total CHOL/HDL Ratio 3    NonHDL 117.96   Hemoglobin A1c  Result Value Ref Range   Hgb A1c MFr Bld 6.1 4.6 - 6.5 %    This visit occurred during the SARS-CoV-2 public health emergency.  Safety protocols were in place, including screening questions prior to the visit, additional usage of staff PPE, and extensive cleaning of exam room while observing appropriate contact time as indicated for disinfecting solutions.   COVID 19 screen:  No recent travel or known exposure to COVID19 The patient denies respiratory symptoms of COVID 19 at this time. The importance of social distancing was discussed today.   Assessment and Plan   The patient's preventative maintenance and recommended screening tests for an annual wellness exam were reviewed in full today. Brought up to date unless services declined.  Counselled on the importance of diet, exercise, and its role in overall health and mortality. The patient's FH and SH was reviewed, including their home life, tobacco status, and drug and alcohol status.   Vaccines:uptodate, COVID x 3 except for td and shingrix ( plan 30 day after radiation) Colon:04/2019, Dr. Fuller Plan, Repeat in 2 years given precancer polyps DVE/pap: total hysterectomy  Mammo: followed by oncology for breast cancer 10/2020 DEXA: 08/2019 osteopenia,  She is now sexually active. Not interested in STD testing.  Problem List Items Addressed This Visit    Carotid artery stenosis    Due for re-eval by vascular.      Ductal carcinoma in situ (DCIS) of right breast    Active treatment on tamoxifem. Followed by Oncology. Dr. Griffith Citron.      Fibromuscular dysplasia ([place])    Due for re-eval by vascular.      Hyperlipidemia    Stable, chronic.  Continue current medication. LDL not at goal < 70 given carotid stenosis .. she will work on low chol diet and weight management.   Atorvastatin 20 mg daily  HYPERTENSION, BENIGN ESSENTIAL, LABILE     Stable, chronic.  Continue current medication.   ON nifedipine, maxide, metoprolol.      Prediabetes   Renal artery stenosis ([facility])    Other Visit Diagnoses    Routine general medical examination at a health care facility    -  Primary      Eliezer Lofts, MD

## 2021-04-01 NOTE — Assessment & Plan Note (Signed)
Due for re-eval by vascular. 

## 2021-04-01 NOTE — Assessment & Plan Note (Addendum)
Stable, chronic.  Continue current medication. LDL not at goal < 70 given carotid stenosis .. she will work on low chol diet and weight management.   Atorvastatin 20 mg daily

## 2021-04-01 NOTE — Patient Instructions (Addendum)
Can use Voltaren gel/cream four time daily for knee pain.  Make appt for follow up on vascular issues with Dr. Lucky Cowboy.  Call Dr. Lynne Leader office for repeat colonoscopy after 04/2021

## 2021-04-01 NOTE — Assessment & Plan Note (Signed)
Due for re-eval by vascular.

## 2021-04-01 NOTE — Assessment & Plan Note (Signed)
Active treatment on tamoxifem. Followed by Oncology. Dr. Griffith Citron.

## 2021-04-03 ENCOUNTER — Other Ambulatory Visit: Payer: Self-pay | Admitting: Oncology

## 2021-04-20 ENCOUNTER — Telehealth: Payer: Self-pay | Admitting: Oncology

## 2021-04-20 NOTE — Telephone Encounter (Signed)
Rescheduled appointment per provider. Patient is aware. 

## 2021-04-25 ENCOUNTER — Ambulatory Visit
Admission: RE | Admit: 2021-04-25 | Discharge: 2021-04-25 | Disposition: A | Discharge status: Home / Self Care | Payer: Medicare Other | Source: Ambulatory Visit | Attending: Radiation Oncology | Admitting: Radiation Oncology

## 2021-04-25 DIAGNOSIS — D0511 Intraductal carcinoma in situ of right breast: Secondary | ICD-10-CM

## 2021-04-25 NOTE — Progress Notes (Signed)
  Radiation Oncology         [phone] ________________________________  Name: Kristy [name] MRN: [id number]  Date of Service: 04/25/2021  DOB: 24-Jun-1941  Post Treatment Telephone Note  Diagnosis:   High-grade ER positive DCIS of the right breast.  Interval Since Last Radiation:  6 weeks   02/16/2021 through 03/15/2021 Site Technique Total Dose (Gy) Dose per Fx (Gy) Completed Fx Beam Energies  Breast, Right: Breast_Rt 3D 42.56/42.56 2.66 16/16 6X  Breast, Right: Breast_Rt_Bst 3D 8/8 2 4/4 6X     Narrative:  The patient was contacted today for routine follow-up. During treatment she did very well with radiotherapy and did not have significant desquamation.   Impression/Plan: 1. 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 I was unable to reach the patient but left a voicemail and on the message discussed that we would be happy to continue to follow her as needed, but she will also continue to follow up with Dr. Jana Hakim  in medical oncology. She was counseled on skin care as well as measures to avoid sun exposure to this area.        Carola Rhine, PAC

## 2021-04-28 ENCOUNTER — Telehealth: Payer: Self-pay | Admitting: Family Medicine

## 2021-04-28 NOTE — Chronic Care Management (AMB) (Signed)
  Chronic Care Management   Note  04/28/2021 Name: Kristy [name] MRN: [id number] DOB: 03/02/1941  Kristy [name] is a 80 y.o. year old female who is a primary care patient of Bedsole, Amy E, MD. I reached out to [name] by phone today in response to a referral sent by Ms. Delsa Bern Keziah's PCP, Jinny Sanders, MD.   Ms. Napierala was given information about Chronic Care Management services today including:  CCM service includes personalized support from designated clinical staff supervised by her physician, including individualized plan of care and coordination with other care providers 24/7 contact phone numbers for assistance for urgent and routine care needs. Service will only be billed when office clinical staff spend 20 minutes or more in a month to coordinate care. Only one practitioner may furnish and bill the service in a calendar month. The patient may stop CCM services at any time (effective at the end of the month) by phone call to the office staff.   Patient agreed to services and verbal consent obtained.   Follow up plan:   Tatjana Secretary/administrator

## 2021-05-05 ENCOUNTER — Telehealth: Payer: Self-pay

## 2021-05-05 DIAGNOSIS — R4589 Other symptoms and signs involving emotional state: Secondary | ICD-10-CM

## 2021-05-05 NOTE — Telephone Encounter (Signed)
Pt is asking for a referral to a counselor to talk to . Preferably a female. She has cancer and feels she needs some help. [place] or [employer] is fine. She will check with insurance, also, to see if they have a place she is required to go to. Please advise at [phone].

## 2021-05-06 DIAGNOSIS — R4589 Other symptoms and signs involving emotional state: Secondary | ICD-10-CM | POA: Insufficient documentation

## 2021-05-06 NOTE — Telephone Encounter (Signed)
Ms. Dunnavant notified as instructed by telephone.

## 2021-05-06 NOTE — Addendum Note (Signed)
Addended byEliezer Lofts E on: 05/06/2021 02:02 PM   Modules accepted: Orders

## 2021-05-11 ENCOUNTER — Telehealth: Payer: Self-pay | Admitting: *Deleted

## 2021-05-11 ENCOUNTER — Telehealth (INDEPENDENT_AMBULATORY_CARE_PROVIDER_SITE_OTHER): Payer: Self-pay | Admitting: Vascular Surgery

## 2021-05-11 NOTE — Telephone Encounter (Signed)
Patient called stating that she has an appointment scheduled with her vascular doctor next month. Patient stated when she was talking with the nurse at the vein doctor 's office she was advised that she should see Dr. Diona Browner prior to her appointment there to look at the dark area on her legs.  Patient stated that she has dark areas around her ankles and hands. Patient stated that the darkness may be due to poor circulation. Patient stated that this is not an emergency and thinks that she needs to have Dr. Diona Browner look at her ankles before her appointment with vascular. Patient scheduled for an appointment with Dr. Diona Browner 05/24/21 at 2:20 pm.

## 2021-05-11 NOTE — Telephone Encounter (Signed)
Noted  

## 2021-05-17 ENCOUNTER — Other Ambulatory Visit: Payer: Self-pay | Admitting: Family Medicine

## 2021-05-24 ENCOUNTER — Ambulatory Visit (INDEPENDENT_AMBULATORY_CARE_PROVIDER_SITE_OTHER): Payer: Medicare Other | Admitting: Family Medicine

## 2021-05-24 ENCOUNTER — Other Ambulatory Visit: Payer: Self-pay

## 2021-05-24 ENCOUNTER — Other Ambulatory Visit: Payer: Self-pay | Admitting: Family Medicine

## 2021-05-24 ENCOUNTER — Encounter: Payer: Self-pay | Admitting: Family Medicine

## 2021-05-24 VITALS — BP 118/70 | HR 73 | Temp 98.4°F | Ht 62.0 in | Wt 171.8 lb

## 2021-05-24 DIAGNOSIS — R0600 Dyspnea, unspecified: Secondary | ICD-10-CM | POA: Diagnosis not present

## 2021-05-24 DIAGNOSIS — R609 Edema, unspecified: Secondary | ICD-10-CM | POA: Diagnosis not present

## 2021-05-24 DIAGNOSIS — R0609 Other forms of dyspnea: Secondary | ICD-10-CM

## 2021-05-24 DIAGNOSIS — R600 Localized edema: Secondary | ICD-10-CM | POA: Insufficient documentation

## 2021-05-24 NOTE — Progress Notes (Signed)
Patient ID: Kristy [name], female    DOB: 07/02/1941, 80 y.o.   MRN: [id number]  This visit was conducted in person.  BP 118/70   Pulse 73   Temp 98.4 F (36.9 C) (Temporal)   Ht 5\' 2"  (1.575 m)   Wt 171 lb 12 oz (77.9 kg)   SpO2 98%   BMI 31.41 kg/m    CC: Chief Complaint  Patient presents with   Dark Ankles    Subjective:   HPI: Kristy [name] is a 80 y.o. female with renal artery stenosis, fibromuscular dysplasia  followed by vascular,  HTN presenting on 05/24/2021 for Dark Ankles  BP Readings from Last 3 Encounters:  05/24/21 118/70  04/01/21 108/70  03/22/21 (!) 149/72  BP well controlled on Toprol xl 100 mg daily, nifedipine XL 60 mg daily, and maxide 25/37.5 mg daily.   She  has noted over past 6 months she has noted darkening of ankle. She has some swelling in bilateral ankle , right > left.  Broken veins in calves, upper thigh. Occ calf pain.  Swelling greater after standing... using compression hose and elevation of feet.  She has now been on tamoxifen  since 03/2021  NML ECHO 2013   She feels more tired lately, occ flushing and  DOE on exertion.  No chest pain. Some heart racing... occ HR at 120 when overheated... improved with rest.   Has appt with vascular 06/09/2021.        Relevant past medical, surgical, family and social history reviewed and updated as indicated. Interim medical history since our last visit reviewed. Allergies and medications reviewed and updated. Outpatient Medications Prior to Visit  Medication Sig Dispense Refill   atorvastatin (LIPITOR) 20 MG tablet TAKE 1 TABLET BY MOUTH  DAILY 90 tablet 3   metoprolol succinate (TOPROL-XL) 100 MG 24 hr tablet TAKE 1 TABLET BY MOUTH  DAILY WITH OR IMMEDIATELY  FOLLOWING A MEAL 90 tablet 3   Multiple Vitamin (MULTIVITAMIN WITH MINERALS) TABS tablet Take 1 tablet by mouth daily.     NIFEdipine (PROCARDIA XL/NIFEDICAL XL) 60 MG 24 hr tablet TAKE 1 TABLET BY MOUTH  DAILY 90 tablet 3    triamterene-hydrochlorothiazide (MAXZIDE-25) 37.5-25 MG tablet Take 1 tablet by mouth daily. 90 tablet 2   valACYclovir (VALTREX) 500 MG tablet Take 1 tablet (500 mg total) by mouth daily. Take daily for suppressive therapy 30 tablet 11   HYDROcodone-acetaminophen (NORCO/VICODIN) 5-325 MG tablet Take 1 tablet by mouth every 6 (six) hours as needed for moderate pain. 15 tablet 0   HYDROcodone-acetaminophen (NORCO/VICODIN) 5-325 MG tablet Take 1 tablet by mouth every 6 (six) hours as needed for moderate pain. 15 tablet 0   No facility-administered medications prior to visit.     Per HPI unless specifically indicated in ROS section below Review of Systems  Constitutional:  Negative for fatigue and fever.  HENT:  Negative for congestion.   Eyes:  Negative for pain.  Respiratory:  Positive for shortness of breath. Negative for cough.   Cardiovascular:  Positive for leg swelling. Negative for chest pain and palpitations.  Gastrointestinal:  Negative for abdominal pain.  Genitourinary:  Negative for dysuria and vaginal bleeding.  Musculoskeletal:  Negative for back pain.  Neurological:  Negative for syncope, light-headedness and headaches.  Psychiatric/Behavioral:  Negative for dysphoric mood.   Objective:  BP 118/70   Pulse 73   Temp 98.4 F (36.9 C) (Temporal)   Ht 5\' 2"  (1.575 m)  Wt 171 lb 12 oz (77.9 kg)   SpO2 98%   BMI 31.41 kg/m   Wt Readings from Last 3 Encounters:  05/24/21 171 lb 12 oz (77.9 kg)  04/01/21 170 lb (77.1 kg)  03/22/21 171 lb (77.6 kg)      Physical Exam Constitutional:      General: She is not in acute distress.    Appearance: Normal appearance. She is well-developed. She is not ill-appearing or toxic-appearing.  HENT:     Head: Normocephalic.     Right Ear: Hearing, tympanic membrane, ear canal and external ear normal. Tympanic membrane is not erythematous, retracted or bulging.     Left Ear: Hearing, tympanic membrane, ear canal and external ear  normal. Tympanic membrane is not erythematous, retracted or bulging.     Nose: No mucosal edema or rhinorrhea.     Right Sinus: No maxillary sinus tenderness or frontal sinus tenderness.     Left Sinus: No maxillary sinus tenderness or frontal sinus tenderness.     Mouth/Throat:     Pharynx: Uvula midline.  Eyes:     General: Lids are normal. Lids are everted, no foreign bodies appreciated.     Conjunctiva/sclera: Conjunctivae normal.     Pupils: Pupils are equal, round, and reactive to light.  Neck:     Thyroid: No thyroid mass or thyromegaly.     Vascular: No carotid bruit.     Trachea: Trachea normal.  Cardiovascular:     Rate and Rhythm: Normal rate and regular rhythm.     Pulses: Normal pulses.     Heart sounds: Normal heart sounds, S1 normal and S2 normal. No murmur heard.   No friction rub. No gallop.  Pulmonary:     Effort: Pulmonary effort is normal. No tachypnea or respiratory distress.     Breath sounds: Normal breath sounds. No decreased breath sounds, wheezing, rhonchi or rales.  Abdominal:     General: Bowel sounds are normal.     Palpations: Abdomen is soft.     Tenderness: There is no abdominal tenderness.  Musculoskeletal:     Cervical back: Normal range of motion and neck supple.  Skin:    General: Skin is warm and dry.     Findings: No rash.     Comments:  Varicose veins bilateral thighs and ankles, calves  No calf tenderness, left leg with 2 plus nonpitting edema, left leg 1 plus nonpitting edema  Darkening at bilateral ankle    Neurological:     Mental Status: She is alert.  Psychiatric:        Mood and Affect: Mood is not anxious or depressed.        Speech: Speech normal.        Behavior: Behavior normal. Behavior is cooperative.        Thought Content: Thought content normal.        Judgment: Judgment normal.      Results for orders placed or performed in visit on 03/25/21  Comprehensive metabolic panel  Result Value Ref Range   Sodium 140 135  - 145 mEq/L   Potassium 4.0 3.5 - 5.1 mEq/L   Chloride 104 96 - 112 mEq/L   CO2 28 19 - 32 mEq/L   Glucose, Bld 102 (H) 70 - 99 mg/dL   BUN 19 6 - 23 mg/dL   Creatinine, Ser 0.84 0.40 - 1.20 mg/dL   Total Bilirubin 0.6 0.2 - 1.2 mg/dL   Alkaline Phosphatase 68 39 - 117  U/L   AST 18 0 - 37 U/L   ALT 15 0 - 35 U/L   Total Protein 7.6 6.0 - 8.3 g/dL   Albumin 4.4 3.5 - 5.2 g/dL   GFR 66.84 >60.00 mL/min   Calcium 10.1 8.4 - 10.5 mg/dL  Lipid panel  Result Value Ref Range   Cholesterol 169 0 - 200 mg/dL   Triglycerides 90.0 0.0 - 149.0 mg/dL   HDL 51.30 >39.00 mg/dL   VLDL 18.0 0.0 - 40.0 mg/dL   LDL Cholesterol 100 (H) 0 - 99 mg/dL   Total CHOL/HDL Ratio 3    NonHDL 117.96   Hemoglobin A1c  Result Value Ref Range   Hgb A1c MFr Bld 6.1 4.6 - 6.5 %    This visit occurred during the SARS-CoV-2 public health emergency.  Safety protocols were in place, including screening questions prior to the visit, additional usage of staff PPE, and extensive cleaning of exam room while observing appropriate contact time as indicated for disinfecting solutions.   COVID 19 screen:  No recent travel or known exposure to COVID19 The patient denies respiratory symptoms of COVID 19 at this time. The importance of social distancing was discussed today.   Assessment and Plan    Problem List Items Addressed This Visit     DOE (dyspnea on exertion)   Relevant Orders   EKG 12-Lead (Completed)   ECHOCARDIOGRAM COMPLETE   Peripheral edema - Primary    Likely due to venous insufficiency but recent worsening may be due to use of tamoxifen.  EKG: normal EKG, normal sinus rhythm, unchanged from previous tracings except  Borderline R wave height concerning for  LVH.  In setting of DOE.. will send for ECHO , BNP and TSH.  Continue compression hose, weight loss, elevation and follow up as planned with vascular.       Relevant Orders   TSH   Brain natriuretic peptide   ECHOCARDIOGRAM COMPLETE      Eliezer Lofts, MD

## 2021-05-24 NOTE — Assessment & Plan Note (Signed)
Likely due to venous insufficiency but recent worsening may be due to use of tamoxifen.  EKG: normal EKG, normal sinus rhythm, unchanged from previous tracings except  Borderline R wave height concerning for  LVH.  In setting of DOE.. will send for ECHO , BNP and TSH.  Continue compression hose, weight loss, elevation and follow up as planned with vascular.

## 2021-05-24 NOTE — Patient Instructions (Addendum)
Please stop at the lab to have labs drawn.  Work on exercise, weight loss, wear compression hose, elevate feet.  Keep follow up with vascular. We will set up the Echocardiogram.

## 2021-05-25 ENCOUNTER — Other Ambulatory Visit: Payer: Self-pay | Admitting: Family Medicine

## 2021-05-25 LAB — BRAIN NATRIURETIC PEPTIDE: Pro B Natriuretic peptide (BNP): 29 pg/mL (ref 0.0–100.0)

## 2021-05-25 LAB — TSH: TSH: 2.6 u[IU]/mL (ref 0.35–5.50)

## 2021-06-09 ENCOUNTER — Ambulatory Visit (INDEPENDENT_AMBULATORY_CARE_PROVIDER_SITE_OTHER): Payer: Medicare Other

## 2021-06-09 ENCOUNTER — Telehealth: Payer: Self-pay | Admitting: Family Medicine

## 2021-06-09 ENCOUNTER — Ambulatory Visit (INDEPENDENT_AMBULATORY_CARE_PROVIDER_SITE_OTHER): Payer: Medicare Other | Admitting: Nurse Practitioner

## 2021-06-09 ENCOUNTER — Other Ambulatory Visit: Payer: Self-pay

## 2021-06-09 ENCOUNTER — Telehealth: Payer: Medicare Other | Admitting: Oncology

## 2021-06-09 ENCOUNTER — Encounter (INDEPENDENT_AMBULATORY_CARE_PROVIDER_SITE_OTHER): Payer: Self-pay | Admitting: Nurse Practitioner

## 2021-06-09 VITALS — BP 137/73 | HR 76 | Resp 16 | Wt 171.0 lb

## 2021-06-09 DIAGNOSIS — I6523 Occlusion and stenosis of bilateral carotid arteries: Secondary | ICD-10-CM | POA: Diagnosis not present

## 2021-06-09 DIAGNOSIS — I701 Atherosclerosis of renal artery: Secondary | ICD-10-CM

## 2021-06-09 DIAGNOSIS — M7989 Other specified soft tissue disorders: Secondary | ICD-10-CM

## 2021-06-09 DIAGNOSIS — E782 Mixed hyperlipidemia: Secondary | ICD-10-CM | POA: Diagnosis not present

## 2021-06-09 NOTE — Progress Notes (Signed)
Subjective:    Patient ID: Kristy [name], female    DOB: 15-Dec-1940, 80 y.o.   MRN: [id number] Chief Complaint  Patient presents with   Follow-up    Ultrasound follow up    Kristy [name] is a 80 year old female that presents today for follow-up of renal artery stenosis as well as carotid artery stenosis.  The patient also has a history of fibromuscular dysplasia.  The patient denies any major issues with her blood pressure.  She notes it is slightly elevated today due to being n.p.o. for her studies this morning.  She has not taken all of her blood pressure medications.  She also endorses having some swelling in the ankle areas but otherwise denies claudication-like symptoms.  Patient is also active daily.  She notes that she wears compression socks.  There have been no noted TIA or CVA-like symptoms.  Overall the patient has been doing well.  Today her carotid duplex shows no evidence of stenosis in the bilateral carotid arteries.  The bilateral vertebral arteries demonstrate antegrade flow with normal flow hemodynamics in the bilateral subclavian arteries.  Today renal artery duplex shows no evidence of stenosis in the left renal artery.  The right renal artery has a 1 to 59% stenosis.  The bilateral kidney size is normal.  The resistive index is normal bilaterally.  At the cortical thickness is normal bilaterally.   Review of Systems  Cardiovascular:  Positive for leg swelling.  Musculoskeletal:  Positive for arthralgias and joint swelling.  All other systems reviewed and are negative.     Objective:   Physical Exam Vitals reviewed.  HENT:     Head: Normocephalic.  Cardiovascular:     Rate and Rhythm: Normal rate.     Pulses: Normal pulses.  Pulmonary:     Effort: Pulmonary effort is normal.  Musculoskeletal:     Right lower leg: 1+ Edema present.     Left lower leg: 1+ Edema present.  Skin:    General: Skin is warm and dry.     Capillary Refill: Capillary refill  takes less than 2 seconds.  Neurological:     Mental Status: She is alert and oriented to person, place, and time.  Psychiatric:        Mood and Affect: Mood normal.        Behavior: Behavior normal.        Thought Content: Thought content normal.        Judgment: Judgment normal.    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   Constipation    uses stool softener PRN- uses once a week to once every 2 weeks    Fibromuscular dysplasia ([place])    Gallbladder disease    GERD (gastroesophageal reflux disease)    Hyperlipidemia    Hypertension    For 16 years; urinary catecholamines within normal limits 12/09; renal Doppler ultrasound showed no evidence  for renal artery stenosis   Normal echocardiogram 4/10   LVEF 65%; no regional wall motion abnormalities; normal RV size and function; pulmonic valve had increased gradient across w/ peak gradient of about 36 mmHg (range of moderate pulmonic stenosis) followup showed normal valve   Papilloma of breast    right   Tinnitus of left ear     Social History   Socioeconomic History   Marital status: Widowed    Spouse name: Not on file   Number of children: 0   Years of education: Not on file   Highest education level: Not on file  Occupational History   Occupation: Merchant navy officer    Employer: RETIRED    Comment: Retired  Tobacco Use   Smoking status: Former    Types: Cigarettes    Quit date: 11/20/1982    Years since quitting: 38.5   Smokeless tobacco: Never   Tobacco comments:    15  pack year history  Vaping Use   Vaping Use: Never used  Substance and Sexual Activity   Alcohol use: No   Drug use: No   Sexual activity: Not on file  Other Topics Concern   Not on file  Social History Narrative   Widowed in 2007-spouse died from colon cancer.   Regular exercise at [facility].   Diet consists of fruits and veggies, snacks a lot.       Social Determinants of Health   Financial Resource Strain: Low Risk    Difficulty of Paying Living Expenses: Not hard at all  Food Insecurity: No Food Insecurity   Worried About Charity fundraiser in the Last Year: Never true   [place] in the Last Year: Never true  Transportation Needs: No Transportation Needs   Lack of Transportation (Medical): No   Lack of Transportation (Non-Medical): No  Physical Activity: Inactive   Days of Exercise per Week: 0 days   Minutes of Exercise per Session: 0 min  Stress: Stress Concern Present   Feeling of Stress : To some extent  Social Connections: Not on file  Intimate Partner Violence: Not At Risk   Fear of Current or Ex-Partner: No   Emotionally Abused: No   Physically Abused: No   Sexually Abused: No    Past Surgical History:  Procedure Laterality Date   ANGIOPLASTY     2012   BREAST BIOPSY Right 2006   benign   BREAST EXCISIONAL BIOPSY Right 07/2017   benign   BREAST LUMPECTOMY WITH RADIOACTIVE SEED LOCALIZATION Right 08/14/2017   Procedure: RIGHT BREAST LUMPECTOMY WITH RADIOACTIVE SEED LOCALIZATION;  Surgeon: [name], MD;  Location: [place];  Service: General;  Laterality: Right;   BREAST LUMPECTOMY WITH RADIOACTIVE SEED LOCALIZATION Right 12/16/2020   Procedure: RADIOACTIVE SEED GUIDED TIMES 3 RIGHT BREAST LUMPECTOMY;  Surgeon: [name], MD;  Location: [place];  Service: General;  Laterality: Right;   [place]  11/07   Neg   COLONOSCOPY     KNEE CARTILAGE SURGERY     right knee   POLYPECTOMY     RE-EXCISION OF BREAST  LUMPECTOMY Right 01/11/2021   Procedure: RE-EXCISION OF RIGHT BREAST LUMPECTOMY;  Surgeon: [name], MD;  Location: [place];  Service: General;  Laterality: Right;   TOTAL ABDOMINAL HYSTERECTOMY     no cervix   UPPER GASTROINTESTINAL ENDOSCOPY      Family History  Problem Relation Age of Onset   Prostate cancer Father  Lung cancer Father    Colon cancer Maternal Uncle    Colon cancer Paternal Uncle    Colon polyps Sister    Coronary artery disease Paternal Grandmother    Goiter Other        ?   Esophageal cancer Neg Hx    Stomach cancer Neg Hx    Rectal cancer Neg Hx    Bladder Cancer Neg Hx    Kidney cancer Neg Hx     Allergies  Allergen Reactions   Vibramycin [Doxycycline]     CBC Latest Ref Rng & Units 12/13/2020 09/28/2020 07/02/2020  WBC 4.0 - 10.5 K/uL 8.0 6.7 7.5  Hemoglobin 12.0 - 15.0 g/dL 13.6 13.8 13.8  Hematocrit 36.0 - 46.0 % 41.1 40.6 40.8  Platelets 150 - 400 K/uL 359 333.0 333.0      CMP     Component Value Date/Time   NA 140 03/25/2021 1159   K 4.0 03/25/2021 1159   CL 104 03/25/2021 1159   CO2 28 03/25/2021 1159   GLUCOSE 102 (H) 03/25/2021 1159   BUN 19 03/25/2021 1159   CREATININE 0.84 03/25/2021 1159   CALCIUM 10.1 03/25/2021 1159   PROT 7.6 03/25/2021 1159   ALBUMIN 4.4 03/25/2021 1159   AST 18 03/25/2021 1159   ALT 15 03/25/2021 1159   ALKPHOS 68 03/25/2021 1159   BILITOT 0.6 03/25/2021 1159   GFRNONAA >60 01/07/2021 1513   GFRAA >60 08/09/2017 1300     No results found.     Assessment & Plan:   1. Bilateral carotid artery stenosis Recommend:  Given the patient's asymptomatic subcritical stenosis no further invasive testing or surgery at this time.  Duplex ultrasound shows less than 10% stenosis bilaterally.  Continue antiplatelet therapy as prescribed Continue management of CAD, HTN and Hyperlipidemia Healthy heart diet,  encouraged exercise at least 4 times per week Follow up in 24 months with  duplex ultrasound and physical exam    2. Renal artery stenosis (HCC) Recommend:  The patient has evidence of atherosclerotic changes of the renal artery.  At this time the patient's blood pressure is fairly well controlled.  Patient does not need angiography of the renal artery given the good control of his hypertension.    However, if at any point the patient's BP becomes acutely worse then intervention would be strongly encouraged.  The patient voices understanding of this plan and agrees.     Patient will follow up on an annual basis for reevaluation.  3. Mixed hyperlipidemia Continue statin as ordered and reviewed, no changes at this time   4. Leg swelling I have had a long discussion with the patient regarding swelling and why it  causes symptoms.  Patient will begin wearing graduated compression stockings class 1 (20-30 mmHg) on a daily basis. The patient will  beginning wearing the stockings first thing in the morning and removing them in the evening. The patient is instructed specifically not to sleep in the stockings.   In addition, behavioral modification will be initiated.  This will include frequent elevationand exercise such as walking.  I have reviewed systemic causes for chronic edema such as liver, kidney and cardiac etiologies.  The patient denies problems with these organ systems.    The patient will implement these conservative therapy tactics.  If these are ineffective with upper or lower extremity edema patient is advised to contact us for a more extensive work-up.   Current Outpatient Medications on File Prior to Visit  Medication Sig  Dispense Refill   Cholecalciferol (D3 2000) 50 MCG (2000 UT) CAPS Take by mouth daily.     metoprolol succinate (TOPROL-XL) 100 MG 24 hr tablet TAKE 1 TABLET BY MOUTH  DAILY WITH OR IMMEDIATELY  FOLLOWING A MEAL 90 tablet 3   Multiple Vitamin (MULTIVITAMIN WITH MINERALS) TABS tablet Take 1 tablet by mouth daily.     NIFEdipine  (PROCARDIA XL/NIFEDICAL XL) 60 MG 24 hr tablet TAKE 1 TABLET BY MOUTH  DAILY 90 tablet 3   TAMOXIFEN CITRATE PO Take 20 mg by mouth daily.     triamterene-hydrochlorothiazide (MAXZIDE-25) 37.5-25 MG tablet TAKE 1 TABLET BY MOUTH  DAILY 90 tablet 3   valACYclovir (VALTREX) 500 MG tablet Take 1 tablet (500 mg total) by mouth daily. Take daily for suppressive therapy 30 tablet 11   atorvastatin (LIPITOR) 20 MG tablet TAKE 1 TABLET BY MOUTH  DAILY (Patient not taking: Reported on 06/09/2021) 90 tablet 3   No current facility-administered medications on file prior to visit.    There are no Patient Instructions on file for this visit. No follow-ups on file.   Kris Hartmann, NP

## 2021-06-09 NOTE — Telephone Encounter (Signed)
Pt called to check status on scheduling Echo. Please advise.

## 2021-06-14 NOTE — Telephone Encounter (Addendum)
No auth required - this is ready to be scheduled at [facility]  Patient can call [facility] Scheduling at [phone]  Message left for patient

## 2021-06-15 NOTE — Telephone Encounter (Signed)
Pt made aware and will call to schedule  Nothing further needed.

## 2021-06-16 ENCOUNTER — Inpatient Hospital Stay (HOSPITAL_BASED_OUTPATIENT_CLINIC_OR_DEPARTMENT_OTHER): Payer: Medicare Other | Admitting: Oncology

## 2021-06-16 DIAGNOSIS — D0511 Intraductal carcinoma in situ of right breast: Secondary | ICD-10-CM

## 2021-06-16 NOTE — Progress Notes (Signed)
Kristy [name]  [phone] [phone]     ID: Kristy [name] DOB: February 08, 1941  MR#: [id number]  [id number]  Patient Care Team: Jinny Sanders, MD as PCP - General [name], MD as Consulting Physician (General Surgery) Shatoya Roets, Virgie Dad, MD as Consulting Physician (Oncology) Kyung Rudd, MD as Consulting Physician (Radiation Oncology) Ladene Artist, MD as Consulting Physician (Gastroenterology) Mauro Kaufmann, RN as Oncology Nurse Navigator Rockwell Germany, RN as Oncology Nurse Navigator Debbora Dus, [facility] as Pharmacist (Pharmacist) Aurea Graff OTHER MD:  I connected with Kristy [name] on 06/16/21 at 10:45 AM EDT by phone  and verified that I am speaking with the correct person using two identifiers.   I discussed the limitations, risks, security and privacy concerns of performing an evaluation and management service by telemedicine and the availability of in-person appointments. I also discussed with the patient that there may be a patient responsible charge related to this service. The patient expressed understanding and agreed to proceed.   Other persons participating in the visit and their role in the encounter: None  Patient's location: Home Provider's location: [place]  Total time spent: 15 min   CHIEF COMPLAINT: noninvasive breast cancer, estrogen receptor positive  CURRENT TREATMENT: Tamoxifen   INTERVAL HISTORY: Kristy [name] was contacted today for follow up of her noninvasive breast cancer.   She was started on tamoxifen at her last visit on 03/22/2021.  She is tolerating this well.  She has no more hot flashes than usual, and that means that that she does get occasional nighttime hot flashes and occasional daytime hot flashes.  She has developed a mild vaginal discharge which is also part of medication otherwise she is tolerating it well.   REVIEW OF SYSTEMS: Kristy [name] is status post  right knee surgery and now has left knee and low back problems.  She is also moderately constipated.  She is taking stool softeners 2 tablets once a day occasionally, not every day.  She is having a hard bowel movement every other day.  This is not a new problem and she says it really started when she changed her diet.  She tells me she is getting at least 5000 steps most days.  Aside from these issues a detailed review of systems today was stable.   COVID 19 VACCINATION STATUS: fully vaccinated [employer]), with booster 08/2020   HISTORY OF CURRENT ILLNESS: From the original intake note:  Kristy [name] has a history of right breast lumpectomy on 08/14/2017 under Dr. Brantley Stage for a tubular adenoma with calcifications.  More recently, she had routine screening mammography on 10/11/2020 showing a possible abnormality in the right breast. She underwent right diagnostic mammography with tomography at [facility] on 10/29/2020 showing: breast density category C; indeterminate 1.1 cm group of fine pleomorphic calcifications in lower-outer right breast.  Accordingly on 11/09/2020 she proceeded to biopsy of the right breast area in question. The pathology from this procedure [id number]) showed: ductal carcinoma in situ, high grade, with necrosis and calcifications; complex sclerosing lesion. Prognostic indicators significant for: estrogen receptor, 95% positive with strong staining intensity and progesterone receptor, 0% negative.   She underwent breast MRI on 11/15/2020 showing: breast composition C; enhancement around a small hematoma and biopsy tract in lower-outer right breast consistent with patient's known malignancy; indeterminate 6 mm enhancing mass with suspicious kinetics, just superior and posterior to biopsy site in lower-outer right breast; indeterminate 1.8 cm area of linear non-mass  enhancement in superior central right breast; no evidence of malignancy in left breast or bilateral lymph  nodes.  She proceeded to additional lower-outer right breast biopsy on 11/23/2020. Pathology [id number]) again showed ductal carcinoma in situ, high grade, with necrosis and calcifications. Prognostic panel showing: estrogen receptor 100% positive with strong staining intensity; progesterone receptor 0% negative.  Two additional right breast biopsies were obtained on 11/29/2020. Pathology [id number]) showed: 1. Right Breast, upper central  - ductal carcinoma in situ with necrosis and calcifications 2. 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 Brantley Stage. Pathology from the procedure ([id number]) showed: multifocal intermediate to high grade ductal carcinoma in situ; carcinoma present at cauterized inferior margin; complex sclerosing lesion.  Cancer Staging Ductal carcinoma in situ (DCIS) of right breast Staging form: Breast, AJCC 7th Edition - Pathologic: Stage 0 (Tis (DCIS), N0, cM0) - Signed by Chauncey Cruel, MD on 12/27/2020 Histologic grade (G): G3 Estrogen receptor status: Positive Progesterone receptor status: Negative  The patient's subsequent history is as detailed below.   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   Constipation    uses stool softener PRN- uses once a week to once every 2 weeks    Fibromuscular dysplasia ([place])    Gallbladder disease    GERD (gastroesophageal reflux disease)     Hyperlipidemia    Hypertension    For 16 years; urinary catecholamines within normal limits 12/09; renal Doppler ultrasound showed no evidence for renal artery stenosis   Normal echocardiogram 4/10   LVEF 65%; no regional wall motion abnormalities; normal RV size and function; pulmonic valve had increased gradient across w/ peak gradient of about 36 mmHg (range of moderate pulmonic stenosis) followup showed normal valve   Papilloma of breast    right   Tinnitus of left ear     PAST SURGICAL HISTORY: Past Surgical History:  Procedure Laterality Date   ANGIOPLASTY     2012   BREAST BIOPSY Right 2006   benign   BREAST EXCISIONAL BIOPSY Right 07/2017   benign   BREAST LUMPECTOMY WITH RADIOACTIVE SEED LOCALIZATION Right 08/14/2017   Procedure: RIGHT BREAST LUMPECTOMY WITH RADIOACTIVE SEED LOCALIZATION;  Surgeon: [name], MD;  Location: [place];  Service: General;  Laterality: Right;   BREAST LUMPECTOMY WITH RADIOACTIVE SEED LOCALIZATION Right 12/16/2020   Procedure: RADIOACTIVE SEED GUIDED TIMES 3 RIGHT BREAST LUMPECTOMY;  Surgeon: [name], MD;  Location: [place];  Service: General;  Laterality: Right;   [place]  11/07   Neg   COLONOSCOPY     KNEE CARTILAGE SURGERY     right knee   POLYPECTOMY     RE-EXCISION OF BREAST LUMPECTOMY Right 01/11/2021   Procedure: RE-EXCISION OF RIGHT BREAST LUMPECTOMY;  Surgeon: [name], MD;  Location: [place];  Service: General;  Laterality: Right;   TOTAL ABDOMINAL HYSTERECTOMY  no cervix   UPPER GASTROINTESTINAL ENDOSCOPY      FAMILY HISTORY: Family History  Problem Relation Age of Onset   Prostate cancer Father    Lung cancer Father    [name] cancer Maternal Uncle    [name] cancer Paternal Uncle    [name] polyps Sister    Coronary artery disease Paternal Grandmother    Goiter Other        ?   Esophageal cancer Neg Hx    Stomach cancer Neg Hx    Rectal cancer Neg  Hx    Bladder Cancer Neg Hx    Kidney cancer Neg Hx   The patient's father died at the age of 39 from lung cancer.  The patient's mother died from amyotrophic lateral sclerosis at age 57.  The patient has 1 brother and 1 sister.  There is no other family history of cancer to her knowledge   GYNECOLOGIC HISTORY:  No LMP recorded. Patient has had a hysterectomy. Menarche: 80 years old [place] P 0 LMP at the time of hysterectomy in 1993, for fibroids contraceptive no HRT yes, about 4 years  Hysterectomy? yes BSO?  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  Her husband died from [name] cancer at age 23.  At [facility] she lives with Pixie Casino, her significant other, who is a retired Administrator.   ADVANCED DIRECTIVES: Not in place; the patient is considering naming her niece Bayard Males, a retired Marine scientist, as her healthcare power of [place]: Social History   Tobacco Use   Smoking status: Former    Types: Cigarettes    Quit date: 11/20/1982    Years since quitting: 38.5   Smokeless tobacco: Never   Tobacco comments:    15 pack year history  Vaping Use   Vaping Use: Never used  Substance Use Topics   Alcohol use: No   Drug use: No     Colonoscopy: 04/2019 (Dr. Fuller Plan), recall 2022  PAP: Status post hysterectomy  Bone density: 08/2019, -2.0   Allergies  Allergen Reactions   Vibramycin [Doxycycline]     Current Outpatient Medications  Medication Sig Dispense Refill   atorvastatin (LIPITOR) 20 MG tablet TAKE 1 TABLET BY MOUTH  DAILY (Patient not taking: Reported on 06/09/2021) 90 tablet 3   Cholecalciferol (D3 2000) 50 MCG (2000 UT) CAPS Take by mouth daily.     metoprolol succinate (TOPROL-XL) 100 MG 24 hr tablet TAKE 1 TABLET BY MOUTH  DAILY WITH OR IMMEDIATELY  FOLLOWING A MEAL 90 tablet 3   Multiple Vitamin (MULTIVITAMIN WITH  MINERALS) TABS tablet Take 1 tablet by mouth daily.     NIFEdipine (PROCARDIA XL/NIFEDICAL XL) 60 MG 24 hr tablet TAKE 1 TABLET BY MOUTH  DAILY 90 tablet 3   TAMOXIFEN CITRATE PO Take 20 mg by mouth daily.     triamterene-hydrochlorothiazide (MAXZIDE-25) 37.5-25 MG tablet TAKE 1 TABLET BY MOUTH  DAILY 90 tablet 3   valACYclovir (VALTREX) 500 MG tablet Take 1 tablet (500 mg total) by mouth daily. Take daily for suppressive therapy 30 tablet 11   No current facility-administered medications for this visit.    OBJECTIVE: African-American woman who appears younger than stated age  There were no vitals filed for this visit.    There is no height or weight on file to calculate BMI.   Wt Readings from Last 3  Encounters:  06/09/21 171 lb (77.6 kg)  05/24/21 171 lb 12 oz (77.9 kg)  04/01/21 170 lb (77.1 kg)     ECOG FS:1 - Symptomatic but completely ambulatory  Telemedicine visit 06/16/2021   LAB RESULTS:  CMP     Component Value Date/Time   NA 140 03/25/2021 1159   K 4.0 03/25/2021 1159   CL 104 03/25/2021 1159   CO2 28 03/25/2021 1159   GLUCOSE 102 (H) 03/25/2021 1159   BUN 19 03/25/2021 1159   CREATININE 0.84 03/25/2021 1159   CALCIUM 10.1 03/25/2021 1159   PROT 7.6 03/25/2021 1159   ALBUMIN 4.4 03/25/2021 1159   AST 18 03/25/2021 1159   ALT 15 03/25/2021 1159   ALKPHOS 68 03/25/2021 1159   BILITOT 0.6 03/25/2021 1159   GFRNONAA >60 01/07/2021 1513   GFRAA >60 08/09/2017 1300    No results found for: TOTALPROTELP, ALBUMINELP, A1GS, A2GS, BETS, BETA2SER, GAMS, MSPIKE, SPEI  Lab Results  Component Value Date   WBC 8.0 12/13/2020   NEUTROABS 3.4 12/13/2020   HGB 13.6 12/13/2020   HCT 41.1 12/13/2020   MCV 96.5 12/13/2020   PLT 359 12/13/2020    No results found for: LABCA2  No components found for: [id number]  No results for input(s): INR in the last 168 hours.  No results found for: LABCA2  No results found for: [id number]  No results found for: [id number]  No  results found for: [id number]  No results found for: CA2729  No components found for: HGQUANT  No results found for: CEA1 / No results found for: CEA1   No results found for: AFPTUMOR  No results found for: CHROMOGRNA  No results found for: KPAFRELGTCHN, LAMBDASER, KAPLAMBRATIO (kappa/lambda light chains)  No results found for: HGBA, HGBA2QUANT, HGBFQUANT, HGBSQUAN (Hemoglobinopathy evaluation)   No results found for: LDH  No results found for: IRON, TIBC, IRONPCTSAT (Iron and TIBC)  No results found for: FERRITIN  Urinalysis    Component Value Date/Time   COLORURINE STRAW (A) 03/02/2017 1320   APPEARANCEUR Clear 08/05/2019 1555   LABSPEC 1.004 (L) 03/02/2017 1320   PHURINE 5.0 03/02/2017 1320   GLUCOSEU Negative 08/05/2019 1555   HGBUR MODERATE (A) 03/02/2017 1320   HGBUR large 11/03/2010 1548   BILIRUBINUR Negative 09/28/2020 1247   BILIRUBINUR Negative 08/05/2019 1555   KETONESUR NEGATIVE 03/02/2017 1320   PROTEINUR Negative 09/28/2020 1247   PROTEINUR Negative 08/05/2019 1555   PROTEINUR NEGATIVE 03/02/2017 1320   UROBILINOGEN 0.2 09/28/2020 1247   UROBILINOGEN 0.2 06/10/2011 1040   NITRITE Negative 09/28/2020 1247   NITRITE Negative 08/05/2019 1555   NITRITE NEGATIVE 03/02/2017 1320   LEUKOCYTESUR Negative 09/28/2020 1247   LEUKOCYTESUR Negative 08/05/2019 1555    STUDIES: VAS US CAROTID  Result Date: 06/09/2021 Carotid Arterial Duplex Study Patient Name:  Kristy [name]  Date of Exam:   06/09/2021 Medical Rec #: [id number]           Accession #:    [id number] Date of Birth: [birth year]           Patient Gender: F Patient Age:   18Y [facility]am [facility] Procedure:      VAS US CAROTID Referring Phys: [id number] [place] --------------------------------------------------------------------------------  Indications:       Right bruit. Comparison Study:  05/05/2019 Performing Technologist: Concha Norway RVT  Examination Guidelines: A complete  evaluation includes B-mode imaging, spectral Doppler, color Doppler, and power Doppler as needed of all accessible portions of each  vessel. Bilateral testing is considered an integral part of a complete examination. Limited examinations for reoccurring indications may be performed as noted.  Right Carotid Findings: +----------+--------+--------+--------+------------------+--------+           PSV cm/sEDV cm/sStenosisPlaque DescriptionComments +----------+--------+--------+--------+------------------+--------+ CCA Prox  67      13                                         +----------+--------+--------+--------+------------------+--------+ CCA Mid   72      21                                         +----------+--------+--------+--------+------------------+--------+ CCA Distal55      18                                         +----------+--------+--------+--------+------------------+--------+ ICA Prox  48      15                                         +----------+--------+--------+--------+------------------+--------+ ICA Mid   61      17                                         +----------+--------+--------+--------+------------------+--------+ ICA Distal47      12                                         +----------+--------+--------+--------+------------------+--------+ ECA       58      13                                         +----------+--------+--------+--------+------------------+--------+ +----------+--------+-------+----------------+-------------------+           PSV cm/sEDV cmsDescribe        Arm Pressure (mmHG) +----------+--------+-------+----------------+-------------------+ [id number]            Multiphasic, WNL                    +----------+--------+-------+----------------+-------------------+ +---------+--------+--+--------+--+---------+ VertebralPSV cm/s47EDV cm/s11Antegrade +---------+--------+--+--------+--+---------+   Left Carotid Findings: +----------+--------+--------+--------+------------------+--------+           PSV cm/sEDV cm/sStenosisPlaque DescriptionComments +----------+--------+--------+--------+------------------+--------+ CCA Prox  71      17                                         +----------+--------+--------+--------+------------------+--------+ CCA Mid   63      17                                         +----------+--------+--------+--------+------------------+--------+ CCA Distal62      14                                         +----------+--------+--------+--------+------------------+--------+  ICA Prox  40      14                                         +----------+--------+--------+--------+------------------+--------+ ICA Mid   43      11                                         +----------+--------+--------+--------+------------------+--------+ ICA Distal43      17                                         +----------+--------+--------+--------+------------------+--------+ ECA       51      9                                          +----------+--------+--------+--------+------------------+--------+ +----------+--------+--------+----------------+-------------------+           PSV cm/sEDV cm/sDescribe        Arm Pressure (mmHG) +----------+--------+--------+----------------+-------------------+ [id number]              Multiphasic, WNL                    +----------+--------+--------+----------------+-------------------+ +---------+--------+--+--------+--+---------+ VertebralPSV cm/s31EDV cm/s10Antegrade +---------+--------+--+--------+--+---------+   Summary: Right Carotid: There is no evidence of stenosis in the right ICA. Left Carotid: There is no evidence of stenosis in the left ICA. Vertebrals:  Bilateral vertebral arteries demonstrate antegrade flow. Subclavians: Normal flow hemodynamics were seen in bilateral subclavian               arteries. *See table(s) above for measurements and observations.  Electronically signed by Hortencia Pilar MD on 06/09/2021 at 5:06:52 PM.    Final    VAS US RENAL ARTERY DUPLEX  Result Date: 06/09/2021 ABDOMINAL VISCERAL Patient Name:  Kristy [name]  Date of Exam:   06/09/2021 Medical Rec #: [id number]           Accession #:    [id number] Date of Birth: [birth year]           Patient Gender: F Patient Age:   27Y Exam Location:  [facility] Procedure:      VAS US RENAL ARTERY DUPLEX Referring Phys: [id number] [place] -------------------------------------------------------------------------------- Indications: HTN is controlled at this point. High Risk Factors: Hypertension. Comparison Study: 05/05/2019 Performing Technologist: Concha Norway RVT  Examination Guidelines: A complete evaluation includes B-mode imaging, spectral Doppler, color Doppler, and power Doppler as needed of all accessible portions of each vessel. Bilateral testing is considered an integral part of a complete examination. Limited examinations for reoccurring indications may be performed as noted.  Duplex Findings: +----------+--------+--------+------+--------+ MesentericPSV cm/sEDV cm/sPlaqueComments +----------+--------+--------+------+--------+ Aorta Mid    77                          +----------+--------+--------+------+--------+    +------------------+--------+--------+-------+ Right Renal ArteryPSV cm/sEDV cm/sComment +------------------+--------+--------+-------+ Proximal             56                   +------------------+--------+--------+-------+ Mid  116                   +------------------+--------+--------+-------+ Distal              171                   +------------------+--------+--------+-------+ +-----------------+--------+--------+-------+ Left Renal ArteryPSV cm/sEDV cm/sComment +-----------------+--------+--------+-------+ Proximal            70                    +-----------------+--------+--------+-------+ Mid                128                   +-----------------+--------+--------+-------+ Distal              99                   +-----------------+--------+--------+-------+ +------------+--------+--------+----+-----------+--------+--------+----+ Right KidneyPSV cm/sEDV cm/sRI  Left KidneyPSV cm/sEDV cm/sRI   +------------+--------+--------+----+-----------+--------+--------+----+ Upper Pole                      Upper Pole                      +------------+--------+--------+----+-----------+--------+--------+----+ Mid         42      11      0.74Mid        62      22      0.65 +------------+--------+--------+----+-----------+--------+--------+----+ Lower Pole                      Lower Pole                      +------------+--------+--------+----+-----------+--------+--------+----+ Hilar                           Hilar                           +------------+--------+--------+----+-----------+--------+--------+----+ +------------------+-----+------------------+-----+ Right Kidney           Left Kidney             +------------------+-----+------------------+-----+ RAR                    RAR                     +------------------+-----+------------------+-----+ RAR (manual)      2.2  RAR (manual)      1.66  +------------------+-----+------------------+-----+ Cortex                 Cortex                  +------------------+-----+------------------+-----+ Cortex thickness       Corex thickness         +------------------+-----+------------------+-----+ Kidney length (cm)10.75Kidney length (cm)11.20 +------------------+-----+------------------+-----+  Summary: Renal:  Right: Normal size right kidney. Normal right Resisitive Index.        Normal cortical thickness of right kidney. 1-59% stenosis of        the right renal artery. RRV flow present. Left:  Normal size of  left kidney. Normal left Resistive Index.        Normal cortical thickness of the left kidney. No evidence of        left renal  artery stenosis. LRV flow present.  *See table(s) above for measurements and observations.  Diagnosing physician: Hortencia Pilar MD  Electronically signed by Hortencia Pilar MD on 06/09/2021 at 5:06:55 PM.    Final      ELIGIBLE FOR AVAILABLE RESEARCH PROTOCOL:   ASSESSMENT: 80 y.o. [place] woman status post right lumpectomy 12/16/2020, for a multi centric ductal carcinoma in situ, grade 3, with a positive inferior lateral margin  (a) consider additional surgery for margin clearance  (1) adjuvant radiation completed 03/15/2021.  (2) tamoxifen started 03/22/2021  (a) status post hysterectomy  (b) bone density 09/15/2019 shows a T score of -2.0   PLAN: Kristy [name] is tolerating tamoxifen well and the plan will be to continue that a total of 5 years.  She is going to increase her stool softeners to 2 tablets twice daily every day and if that does not work she will consider MiraLAX  We are going to resume her yearly mammography early December and she will see me shortly after that.  From that point visits will be once a year   Sarajane Jews C. Thurmon Mizell, MD 06/16/2021 9:16 AM Medical Oncology and Hematology [facility] [place], [place] [address] Tel. [phone]    Fax. [phone]   This document serves as a record of services personally performed by Lurline Del, MD. It was created on his behalf by Wilburn Mylar, a trained medical scribe. The creation of this record is based on the scribe's personal observations and the provider's statements to them.   I, Lurline Del MD, have reviewed the above documentation for accuracy and completeness, and I agree with the above.   *Total Encounter Time as defined by the Centers for Medicare and Medicaid Services includes, in addition to the face-to-face time of a patient visit (documented  in the note above) non-face-to-face time: obtaining and reviewing outside history, ordering and reviewing medications, tests or procedures, care coordination (communications with other health care professionals or caregivers) and documentation in the medical record.

## 2021-06-20 ENCOUNTER — Telehealth: Payer: Self-pay | Admitting: Oncology

## 2021-06-20 NOTE — Telephone Encounter (Signed)
Scheduled appointment per 07/28 los. Patient is aware. 

## 2021-06-21 ENCOUNTER — Telehealth: Payer: Medicare Other

## 2021-06-28 ENCOUNTER — Telehealth: Payer: Self-pay

## 2021-06-28 ENCOUNTER — Telehealth: Payer: Self-pay | Admitting: *Deleted

## 2021-06-28 NOTE — Chronic Care Management (AMB) (Addendum)
Chronic Care Management Pharmacy Assistant   Name: Kristy [name]  MRN: [id number] DOB: 05/30/1941  Kristy [name] is an 80 y.o. year old female who presents for her initial CCM visit with the clinical pharmacist.  Reason for Encounter: Initial Questions   Conditions to be addressed/monitored: HTN and HLD  Recent office visits:  05/24/21 Orlin Hilding PCP - Patient presented for peripheral edema. Wear compression hose and elevate feet. Follow up with Vascular Surgery. 04/01/21 - Dr.Bedsole PCP - Patient presents for AWV. May use Voltaren gel/cream four time daily for knee pain. Make appt for follow up on vascular issues with Dr. Lucky Cowboy. Call Dr. Lynne Leader office for repeat colonoscopy after 04/2021.  Recent consult visits:  06/16/21 - Oncology - Patient presented for follow up breast cancer-radiation treatments. Increase her stool softeners to 2 tablets twice daily every day. If ineffective, consider MiraLAX. 06/09/21 - Vascular Surgery - Patient presented for follow up carotid artery stenosis. No medication changes. 04/25/21 - [place] - Patient presents for radiation treatment. 03/22/21 - Oncology - Patient presented for ductal carcinoma. Start tamoxifen.  Hospital visits:  01/11/21 - [place]- Patient presented for Right Breast Lumpectomy. Start doxycycline '100mg'$  take 1 tablet 2 times daily.  Medications: Outpatient Encounter Medications as of 06/28/2021  Medication Sig   atorvastatin (LIPITOR) 20 MG tablet TAKE 1 TABLET BY MOUTH  DAILY (Patient not taking: Reported on 06/09/2021)   Cholecalciferol (D3 2000) 50 MCG (2000 UT) CAPS Take by mouth daily.   metoprolol succinate (TOPROL-XL) 100 MG 24 hr tablet TAKE 1 TABLET BY MOUTH  DAILY WITH OR IMMEDIATELY  FOLLOWING A MEAL   Multiple Vitamin (MULTIVITAMIN WITH MINERALS) TABS tablet Take 1 tablet by mouth daily.   NIFEdipine (PROCARDIA XL/NIFEDICAL XL) 60 MG 24 hr tablet TAKE 1 TABLET BY MOUTH  DAILY    TAMOXIFEN CITRATE PO Take 20 mg by mouth daily.   triamterene-hydrochlorothiazide (MAXZIDE-25) 37.5-25 MG tablet TAKE 1 TABLET BY MOUTH  DAILY   valACYclovir (VALTREX) 500 MG tablet Take 1 tablet (500 mg total) by mouth daily. Take daily for suppressive therapy   No facility-administered encounter medications on file as of 06/28/2021.    Lab Results  Component Value Date/Time   HGBA1C 6.1 03/25/2021 11:59 AM   HGBA1C 6.1 11/18/2019 09:35 AM     BP Readings from Last 3 Encounters:  06/09/21 137/73  05/24/21 118/70  04/01/21 108/70    Patient contacted 06/29/21 to review initial questions prior to visit with Debbora Dus.  Have you seen any other providers since your last visit with PCP? Yes - Denies recent med changes  Any changes in your medications or health? No  Any side effects from any medications? No  Do you have an symptoms or problems not managed by your medications? No  Any concerns about your health right now? Yes  Has your provider asked that you check blood pressure, or follow special diet at home? Yes the patient reports she checks her blood pressure at home and will have some readings for review.  Do you get any type of exercise on a regular basis? No  Can you think of a goal you would like to reach for your health? No  Do you have any problems getting your medications? No  Is there anything that you would like to discuss during the appointment? No   KIMETHA SISLER was reminded to have all medications, supplements and any blood glucose and blood pressure readings  available for review with Debbora Dus, Pharm. D, at her telephone visit on 07/05/2021 at 3:30pm.    Star Rating Drugs:  Medication:  Last Fill: Day Supply Atorvastatin '20mg'$  06/22/21  90    Care Gaps: Last annual wellness visit: 04/01/21  Debbora Dus, CPP notified  Avel Sensor, [place] Assistant [phone]  I have reviewed the care management and care coordination  activities outlined in this encounter and I am certifying that I agree with the content of this note. No further action required.  Debbora Dus, PharmD Clinical Pharmacist [facility] at [facility] [phone]

## 2021-06-28 NOTE — Telephone Encounter (Signed)
Patient called stating that she started with symptoms Friday. Patient stated that she has had a runny nose, runny eyes, nasal congestion and headache. Patient stated that she did a home covid test and it was negative. Patient denies a fever or cough and stated that she knows that she does not have covid. Patient stated that she is sure that her symptoms are caused by allergies. Patient stated that she started taking Zyrtec yesterday. Patient wants to know if there is anything else that Dr. Diona Browner would recommend that she take.

## 2021-06-29 ENCOUNTER — Telehealth: Payer: Medicare Other

## 2021-06-29 NOTE — Telephone Encounter (Signed)
For allergies.. you can continue the zyrtec and add flonase 2 sprays per nostril. Also can add nasal saline spray  2 times daily or saline  irrigation daily to clean out allergen.  If not improving, make virtual appt to be seen to eval for bacterial infection and consider repeating home COVID test 5 days after symptoms started to make sure it is not developing.

## 2021-06-29 NOTE — Telephone Encounter (Signed)
Kristy [name] notified as instructed by telephone.  Patient states understanding.

## 2021-07-05 ENCOUNTER — Ambulatory Visit (INDEPENDENT_AMBULATORY_CARE_PROVIDER_SITE_OTHER): Payer: Medicare Other

## 2021-07-05 ENCOUNTER — Other Ambulatory Visit: Payer: Self-pay

## 2021-07-05 DIAGNOSIS — I10 Essential (primary) hypertension: Secondary | ICD-10-CM

## 2021-07-05 DIAGNOSIS — E782 Mixed hyperlipidemia: Secondary | ICD-10-CM

## 2021-07-05 NOTE — Progress Notes (Signed)
Chronic Care Management Pharmacy Note  07/05/2021 Name:  Kristy [name] MRN:  [id number] DOB:  08-17-1941  Summary: HTN and Cholesterol discussed. HTN stable controlled, meds effective, safe, accessible. Cholesterol, LDL 100. She confirms taking atorvastatin 20 mg daily. She is working on [facility] and activity level. No med concerns identified. She self discontinued her vitamin D and multivitamin.  Recommendations/Changes made from today's visit: Resume vitamin D3 supplement daily for bone health/history of deficiency   Plan: CCM follow up 12 months or sooner if needed Cholesterol adherence review in 6 months   Subjective: Kristy [name] is an 80 y.o. year old female who is a primary patient of Bedsole, Amy E, MD.  The CCM team was consulted for assistance with disease management and care coordination needs.    Engaged with patient by telephone for initial visit in response to provider referral for pharmacy case management and/or care coordination services. Pt reports primary health concern is her breast cancer (found dec 2021, radiation following) and she also is waiting on gallbladder surgery (postponed due to cancer). She had some gallstones.  Consent to Services:  The patient was given the following information about Chronic Care Management services today, agreed to services, and gave verbal consent: 1. CCM service includes personalized support from designated clinical staff supervised by the primary care provider, including individualized plan of care and coordination with other care providers 2. 24/7 contact phone numbers for assistance for urgent and routine care needs. 3. Service will only be billed when office clinical staff spend 20 minutes or more in a month to coordinate care. 4. Only one practitioner may furnish and bill the service in a calendar month. 5.The patient may stop CCM services at any time (effective at the end of the month) by phone call to the office  staff. 6. The patient will be responsible for cost sharing (co-pay) of up to 20% of the service fee (after annual deductible is met). Patient agreed to services and consent obtained.  Patient Care Team: Jinny Sanders, MD as PCP - General [name], MD as Consulting Physician (General Surgery) Magrinat, Virgie Dad, MD as Consulting Physician (Oncology) Kyung Rudd, MD as Consulting Physician (Radiation Oncology) Ladene Artist, MD as Consulting Physician (Gastroenterology) Mauro Kaufmann, RN as Oncology Nurse Navigator Rockwell Germany, RN as Oncology Nurse Navigator Debbora Dus, [facility] as Pharmacist (Pharmacist)  Recent office visits:  05/24/21 Orlin Hilding PCP - Patient presented for peripheral edema. Wear compression hose and elevate feet. Follow up with Vascular Surgery. 04/01/21 - Dr.Bedsole PCP - Patient presents for AWV. May use Voltaren gel/cream four time daily for knee pain. Make appt for follow up on vascular issues with Dr. Lucky Cowboy. Call Dr. Lynne Leader office for repeat colonoscopy after 04/2021.   Recent consult visits:  06/16/21 - Oncology - Patient presented for follow up breast cancer-radiation treatments. Increase her stool softeners to 2 tablets twice daily every day. If ineffective, consider MiraLAX. 06/09/21 - Vascular Surgery - Patient presented for follow up carotid artery stenosis. No medication changes. 04/25/21 - [place] - Patient presents for radiation treatment. 03/22/21 - Oncology - Patient presented for ductal carcinoma. Start tamoxifen.   Hospital visits:  01/11/21 - [place]- Patient presented for Right Breast Lumpectomy. Start doxycycline 176m take 1 tablet 2 times daily.  Objective:  Lab Results  Component Value Date   CREATININE 0.84 03/25/2021   BUN 19 03/25/2021   GFR 66.84 03/25/2021   GFRNONAA >60  01/07/2021   GFRAA >60 08/09/2017   NA 140 03/25/2021   K 4.0 03/25/2021   CALCIUM 10.1 03/25/2021   CO2 28 03/25/2021    GLUCOSE 102 (H) 03/25/2021    Lab Results  Component Value Date/Time   HGBA1C 6.1 03/25/2021 11:59 AM   HGBA1C 6.1 11/18/2019 09:35 AM   GFR 66.84 03/25/2021 11:59 AM   GFR 73.31 09/28/2020 01:28 PM    Lab Results  Component Value Date   CHOL 169 03/25/2021   HDL 51.30 03/25/2021   LDLCALC 100 (H) 03/25/2021   LDLDIRECT 140.3 03/26/2013   TRIG 90.0 03/25/2021   CHOLHDL 3 03/25/2021    Hepatic Function Latest Ref Rng & Units 03/25/2021 12/13/2020 09/28/2020  Total Protein 6.0 - 8.3 g/dL 7.6 7.2 7.5  Albumin 3.5 - 5.2 g/dL 4.4 3.8 4.3  AST 0 - 37 U/L _0 ALT 0 - 35 U/L _1 Alk Phosphatase 39 - 117 U/L 68 68 69  Total Bilirubin 0.2 - 1.2 mg/dL 0.6 0.9 0.4  Bilirubin, Direct 0.0 - 0.3 mg/dL - - -    Lab Results  Component Value Date/Time   TSH 2.60 05/24/2021 03:34 PM   TSH 2.84 07/02/2020 09:33 AM   FREET4 0.77 04/17/2019 10:09 AM   FREET4 0.64 11/05/2018 11:13 AM    CBC Latest Ref Rng & Units 12/13/2020 09/28/2020 07/02/2020  WBC 4.0 - 10.5 K/uL 8.0 6.7 7.5  Hemoglobin 12.0 - 15.0 g/dL 13.6 13.8 13.8  Hematocrit 36.0 - 46.0 % 41.1 40.6 40.8  Platelets 150 - 400 K/uL 359 333.0 333.0    Lab Results  Component Value Date/Time   VD25OH 28.04 (L) 11/18/2019 09:35 AM   VD25OH 26.95 (L) 09/04/2017 11:35 AM    Clinical ASCVD: No  The 10-year ASCVD risk score Mikey Bussing DC Jr., et al., 2013) is: 15.9%   Values used to calculate the score:     Age: 16 years     Sex: Female     Is Non-Hispanic African American: Yes     Diabetic: No     Tobacco smoker: No     Systolic Blood Pressure: 450 mmHg     Is BP treated: Yes     HDL Cholesterol: 51.3 mg/dL     Total Cholesterol: 169 mg/dL    Depression screen [facility] 2/9 03/03/2021 11/18/2019 11/05/2018  Decreased Interest 2 0 0  Down, Depressed, Hopeless 2 0 0  PHQ - 2 Score 4 0 0  Altered sleeping 0 0 0  Tired, decreased energy 0 0 0  Change in appetite 0 0 0  Feeling bad or failure about yourself  0 0 0  Trouble  concentrating 0 0 0  Moving slowly or fidgety/restless 0 0 0  Suicidal thoughts 0 0 0  PHQ-9 Score 4 0 0  Difficult doing work/chores Somewhat difficult Not difficult at all Not difficult at all  Some recent data might be hidden    Social History   Tobacco Use  Smoking Status Former   Types: Cigarettes   Quit date: 11/20/1982   Years since quitting: 38.6  Smokeless Tobacco Never  Tobacco Comments   15 pack year history   BP Readings from Last 3 Encounters:  06/09/21 137/73  05/24/21 118/70  04/01/21 108/70   Pulse Readings from Last 3 Encounters:  06/09/21 76  05/24/21 73  04/01/21 72   Wt Readings from Last 3 Encounters:  06/09/21 171 lb (77.6 kg)  05/24/21 171 lb 12  oz (77.9 kg)  04/01/21 170 lb (77.1 kg)   BMI Readings from Last 3 Encounters:  06/09/21 31.28 kg/m  05/24/21 31.41 kg/m  04/01/21 31.09 kg/m    Assessment/Interventions: Review of patient past medical history, allergies, medications, health status, including review of consultants reports, laboratory and other test data, was performed as part of comprehensive evaluation and provision of chronic care management services.   SDOH:  (Social Determinants of Health) assessments and interventions performed: Yes SDOH Interventions    Flowsheet Row Most Recent Value  SDOH Interventions   Financial Strain Interventions Intervention Not Indicated      SDOH Screenings   Alcohol Screen: Low Risk    Last Alcohol Screening Score (AUDIT): 0  Depression (PHQ2-9): Low Risk    PHQ-2 Score: 4  Financial Resource Strain: Low Risk    Difficulty of Paying Living Expenses: Not hard at all  Food Insecurity: No Food Insecurity   Worried About Charity fundraiser in the Last Year: Never true   Ran Out of Food in the Last Year: Never true  Housing: Low Risk    Last Housing Risk Score: 0  Physical Activity: Inactive   Days of Exercise per Week: 0 days   Minutes of Exercise per Session: 0 min  Social Connections:  Not on file  Stress: Stress Concern Present   Feeling of Stress : To some extent  Tobacco Use: Medium Risk   Smoking Tobacco Use: Former   Smokeless Tobacco Use: Never  Transportation Needs: No Data processing manager (Medical): No   Lack of Transportation (Non-Medical): No    CCM Care Plan  Allergies  Allergen Reactions   Vibramycin [Doxycycline]     Medications Reviewed Today     Reviewed by Debbora Dus, [facility] (Pharmacist) on 07/05/21 at 1546  Med List Status: <None>   Medication Order Taking? Sig Documenting Provider Last Dose Status Informant  atorvastatin (LIPITOR) 20 MG tablet [id number] Yes TAKE 1 TABLET BY MOUTH  DAILY Bedsole, Amy E, MD Taking Active Self  Cholecalciferol (D3 2000) 50 MCG (2000 UT) CAPS [id number] Yes Take by mouth daily. [provider] Taking Active   metoprolol succinate (TOPROL-XL) 100 MG 24 hr tablet [id number] Yes TAKE 1 TABLET BY MOUTH  DAILY WITH OR IMMEDIATELY  FOLLOWING A MEAL Bedsole, Amy E, MD Taking Active   NIFEdipine (PROCARDIA XL/NIFEDICAL XL) 60 MG 24 hr tablet [id number] Yes TAKE 1 TABLET BY MOUTH  DAILY Diona Browner, Amy E, MD Taking Active   TAMOXIFEN CITRATE PO [id number] Yes Take 20 mg by mouth daily. [provider] Taking Active   triamterene-hydrochlorothiazide (MAXZIDE-25) 37.5-25 MG tablet [id number] Yes TAKE 1 TABLET BY MOUTH  DAILY Bedsole, Amy E, MD Taking Active   valACYclovir (VALTREX) 500 MG tablet [id number] Yes Take 1 tablet (500 mg total) by mouth daily. 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   Blister of buttock 02/10/2020   Dizziness 04/15/2019   Constipation 04/26/2018   Bradycardia 04/26/2018   Carotid artery stenosis 02/20/2018   Renal artery  stenosis (HCC) 01/29/2017   BPPV (benign paroxysmal positional vertigo) 12/17/2015   Hot flashes 12/08/2014   DDD (degenerative disc disease), lumbar 06/02/2014   Left  hip pain 04/17/2014   Lumbar radiculitis 04/17/2014   Lumbar spondylosis 04/17/2014   DOE (dyspnea on exertion) 04/14/2014   Microscopic hematuria, idiopathic 12/16/2013   Allergic rhinitis 10/04/2012   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/2012   Osteoporosis 09/14/2010   Prediabetes 09/14/2010   ONYCHOMYCOSIS 08/12/2009   HYPERTENSION, BENIGN ESSENTIAL, LABILE 02/15/2009   LOW BACK PAIN, CHRONIC 02/01/2009   Fibromuscular dysplasia ([place]) 08/27/2008   Hyperlipidemia 08/28/2007   GERD 08/28/2007   Irritable bowel syndrome 08/28/2007    Immunization History  Administered Date(s) Administered   Fluad Quad(high Dose 65+) 07/24/2019, 09/11/2020   Influenza Split 10/03/2011, 08/30/2012   Influenza Whole 08/28/2007, 08/19/2008, 08/12/2009, 09/14/2010   Influenza,inj,Quad PF,6+ Mos 08/13/2013, 08/18/2014, 09/03/2015, 08/03/2016, 09/04/2017, 09/05/2018   PFIZER(Purple Top)SARS-COV-2 Vaccination 12/24/2019, 01/14/2020, 08/22/2020   Pneumococcal Conjugate-13 01/09/2014   Pneumococcal Polysaccharide-23 04/20/2006, 09/04/2017   Td 04/20/2006    Conditions to be addressed/monitored:  Hypertension and Hyperlipidemia  Care Plan : [place]  Updates made by Debbora Dus, [place] since 07/05/2021 12:00 AM     Problem: CHL AMB "PATIENT-SPECIFIC PROBLEM"      Long-Range Goal: Disease Management   Start Date: 07/05/2021  Priority: High  Note:   Current Barriers:  Cholesterol elevated - LDL 100  Pharmacist Clinical Goal(s):  Patient will contact provider office for questions/concerns as evidenced notation of same in electronic health record through collaboration with PharmD and provider.   Interventions: 1:1 collaboration with Jinny Sanders, MD regarding development and update of  comprehensive plan of care as evidenced by provider attestation and co-signature Inter-disciplinary care team collaboration (see longitudinal plan of care) Comprehensive medication review performed; medication list updated in electronic medical record  Hypertension (BP goal <140/90) -Controlled - per clinic readings  -Current treatment: Metoprolol succinate 100 mg daily Nifedipine 60 mg daily Triamterene/HCTZ 37.5-25 mg daily -Medications previously tried: none reported  -Current home readings: she checks it once or twice a week - 115/67, 72  -Denies hypotensive/hypertensive symptoms -Educated on BP goals and benefits of medications for prevention of heart attack, stroke and kidney damage; -Counseled to monitor BP at home weekly, document, and provide log at future appointments -Recommended to continue current medication  Hyperlipidemia: (LDL goal < 70) -Not ideally controlled - LDL 100 (05/22) -Current treatment: Atorvastatin 20 mg daily -CV risk factors - pre DM, A1c 6.1% stable -She affirms daily adherence and is working hard on diet. -Medications previously tried: none   -Current dietary patterns: she avoids red meats and fried foods, increase vegetables -Current exercise habits: she targets 5000 steps per day, very active -Educated on Cholesterol goals;  Importance of limiting foods high in cholesterol; Exercise goal of 150 minutes per week; -Recommended to continue current medication  Patient Goals/Self-Care Activities Patient will:  - take medications as prescribed engage in dietary modifications by continue to limit foods high in cholesterol  Follow Up Plan: Telephone follow up appointment with care management team member scheduled for:  12 months; CMA cholesterol review in 6 months    Medication Assistance: None required.  Patient affirms current coverage meets needs. No copays.  Compliance/Adherence/Medication fill history: Care Gaps: Colonoscopy - she is aware  its due, focusing on breast cancer treatments first  Star-Rating Drugs: Medication:                Last Fill:         Day Supply Atorvastatin 33m       06/22/21  90                       Takes BP meds, cholesterol, and tamoxifen in morning Currently taking Acyclovir for cold sore in eye She is not taking multivitamin or vitamin d - encouraged resuming vitamin D3 daily  Patient's preferred pharmacy is:  [id number] [place], [place] [address]Phone: [phone]: [phone] [facility] [place] [address]Phone: [phone]: [phone] She uses mail order for everything except acute meds.  Uses pill box? Yes Pt endorses 100% compliance  We discussed: Current pharmacy is preferred with insurance plan and patient is satisfied with pharmacy services Patient decided to: Continue current medication management strategy  Care Plan and Follow Up Patient Decision:  Patient agrees to Care Plan and Follow-up.  MDebbora Dus PharmD Clinical Pharmacist [phone]

## 2021-07-05 NOTE — Patient Instructions (Signed)
July 05, 2021  Dear Kristy [name],  It was a pleasure meeting you during our initial appointment on July 05, 2021. Below is a summary of the goals we discussed and components of chronic care management. Please contact me anytime with questions or concerns.   Visit Information  Patient Care Plan: CCM Pharmacy Care Plan     Problem Identified: CHL AMB "PATIENT-SPECIFIC PROBLEM"      Long-Range Goal: Disease Management   Start Date: 07/05/2021  Priority: High  Note:   Current Barriers:  Cholesterol elevated - LDL 100  Pharmacist Clinical Goal(s):  Patient will contact provider office for questions/concerns as evidenced notation of same in electronic health record through collaboration with PharmD and provider.   Interventions: 1:1 collaboration with Jinny Sanders, MD regarding development and update of comprehensive plan of care as evidenced by provider attestation and co-signature Inter-disciplinary care team collaboration (see longitudinal plan of care) Comprehensive medication review performed; medication list updated in electronic medical record  Hypertension (BP goal <140/90) -Controlled - per clinic readings  -Current treatment: Metoprolol succinate 100 mg daily Nifedipine 60 mg daily Triamterene/HCTZ 37.5-25 mg daily -Medications previously tried: none reported  -Current home readings: she checks it once or twice a week - 115/67, 72  -Denies hypotensive/hypertensive symptoms -Educated on BP goals and benefits of medications for prevention of heart attack, stroke and kidney damage; -Counseled to monitor BP at home weekly, document, and provide log at future appointments -Recommended to continue current medication  Hyperlipidemia: (LDL goal < 70) -Not ideally controlled - LDL 100 (05/22) -Current treatment: Atorvastatin 20 mg daily -CV risk factors - pre DM, A1c 6.1% stable -She affirms daily adherence and is working hard on diet. -Medications previously  tried: none   -Current dietary patterns: she avoids red meats and fried foods, increase vegetables -Current exercise habits: she targets 5000 steps per day, very active -Educated on Cholesterol goals;  Importance of limiting foods high in cholesterol; Exercise goal of 150 minutes per week; -Recommended to continue current medication  Patient Goals/Self-Care Activities Patient will:  - take medications as prescribed engage in dietary modifications by continue to limit foods high in cholesterol  Follow Up Plan: Telephone follow up appointment with care management team member scheduled for:  12 months; CMA cholesterol review in 6 months     Kristy [name] was given information about Chronic Care Management services today including:  CCM service includes personalized support from designated clinical staff supervised by her physician, including individualized plan of care and coordination with other care providers 24/7 contact phone numbers for assistance for urgent and routine care needs. Standard insurance, coinsurance, copays and deductibles apply for chronic care management only during months in which we provide at least 20 minutes of these services. Most insurances cover these services at 100%, however patients may be responsible for any copay, coinsurance and/or deductible if applicable. This service may help you avoid the need for more expensive face-to-face services. Only one practitioner may furnish and bill the service in a calendar month. The patient may stop CCM services at any time (effective at the end of the month) by phone call to the office staff.  Patient agreed to services and verbal consent obtained.   Patient verbalizes understanding of instructions provided today and agrees to view in [place].   Debbora Dus, PharmD Clinical Pharmacist [facility] at [facility] [phone]

## 2021-07-12 ENCOUNTER — Other Ambulatory Visit: Payer: Self-pay

## 2021-07-12 ENCOUNTER — Ambulatory Visit
Admission: RE | Admit: 2021-07-12 | Discharge: 2021-07-12 | Disposition: A | Discharge status: Home / Self Care | Payer: Medicare Other | Source: Ambulatory Visit | Attending: Family Medicine | Admitting: Family Medicine

## 2021-07-12 DIAGNOSIS — I10 Essential (primary) hypertension: Secondary | ICD-10-CM | POA: Diagnosis not present

## 2021-07-12 DIAGNOSIS — R0600 Dyspnea, unspecified: Secondary | ICD-10-CM | POA: Insufficient documentation

## 2021-07-12 DIAGNOSIS — R609 Edema, unspecified: Secondary | ICD-10-CM | POA: Diagnosis not present

## 2021-07-12 DIAGNOSIS — R0609 Other forms of dyspnea: Secondary | ICD-10-CM

## 2021-07-12 DIAGNOSIS — C50919 Malignant neoplasm of unspecified site of unspecified female breast: Secondary | ICD-10-CM | POA: Diagnosis not present

## 2021-07-12 LAB — ECHOCARDIOGRAM COMPLETE
AR max vel: 2.53 cm2
AV Area VTI: 3.4 cm2
AV Area mean vel: 2.69 cm2
AV Mean grad: 2 mmHg
AV Peak grad: 4.2 mmHg
Ao pk vel: 1.02 m/s
Area-P 1/2: 2.71 cm2
S' Lateral: 2.14 cm

## 2021-07-12 NOTE — Progress Notes (Signed)
*  PRELIMINARY RESULTS* Echocardiogram 2D Echocardiogram has been performed.  Sherrie Sport 07/12/2021, 11:38 AM

## 2021-07-14 ENCOUNTER — Telehealth: Payer: Self-pay

## 2021-07-14 NOTE — Telephone Encounter (Signed)
Let her know given BP is normal and swelling is improved... I would just have her continue maxide for now. Elevate feet as much as able and wear compression hose.

## 2021-07-14 NOTE — Telephone Encounter (Signed)
Patient called and l/m on triage line with her answers to Dr Rometta Emery questions from Echo-patient said she could not respond through mychart.  Patient states her b/p has been running around 114/65-this was the reading this morning.  Her right ankle and right leg swell mainly off and on and her left foot is not as bad.  Patient said to let her know your recommendations on fluid pill or how she needs to proceed at this time.

## 2021-07-15 NOTE — Telephone Encounter (Signed)
Kristy [name] notified as instructed by telephone.  Patient states understanding.  

## 2021-07-21 NOTE — Telephone Encounter (Signed)
Given she has multiple concerns have her make and appt in next week.  Resting HR goal 60-90.Marland Kitchen  it can increase with exertion activity but should come back down.  IF CP, SOB.. have her go to ER.

## 2021-07-21 NOTE — Telephone Encounter (Signed)
Left message for Mrs. Pigue to return my call.

## 2021-07-21 NOTE — Telephone Encounter (Signed)
Mrs. Tepe notified as instructed by telephone.  Appointment scheduled for 07/26/2021 at 11:20 am with Dr. Diona Browner.

## 2021-07-21 NOTE — Telephone Encounter (Signed)
Vm from pt stating she still has concerns about ECHO.  Wants to know what her HR should be.  Says her L ear was ringing all day yesterday, BP 150/81 and HR 70s-120s going from room to room.  Says she's approaching her 84s and these little things are scaring her.  Pt asks is there something else going on other than what was seen in her ECHO.  Should she be concerned?  Plz advise.

## 2021-07-26 ENCOUNTER — Ambulatory Visit (INDEPENDENT_AMBULATORY_CARE_PROVIDER_SITE_OTHER): Payer: Medicare Other | Admitting: Family Medicine

## 2021-07-26 ENCOUNTER — Encounter: Payer: Self-pay | Admitting: Family Medicine

## 2021-07-26 ENCOUNTER — Other Ambulatory Visit: Payer: Self-pay

## 2021-07-26 VITALS — BP 126/84 | HR 66 | Temp 97.5°F | Ht 62.0 in | Wt 173.0 lb

## 2021-07-26 DIAGNOSIS — I10 Essential (primary) hypertension: Secondary | ICD-10-CM

## 2021-07-26 DIAGNOSIS — R609 Edema, unspecified: Secondary | ICD-10-CM | POA: Diagnosis not present

## 2021-07-26 DIAGNOSIS — I701 Atherosclerosis of renal artery: Secondary | ICD-10-CM | POA: Diagnosis not present

## 2021-07-26 DIAGNOSIS — R000 Tachycardia, unspecified: Secondary | ICD-10-CM | POA: Insufficient documentation

## 2021-07-26 DIAGNOSIS — I5189 Other ill-defined heart diseases: Secondary | ICD-10-CM | POA: Diagnosis not present

## 2021-07-26 MED ORDER — LOSARTAN POTASSIUM 50 MG PO TABS
50.0000 mg | ORAL_TABLET | Freq: Every day | ORAL | 11 refills | Status: DC
Start: 2021-07-26 — End: 2021-08-18

## 2021-07-26 NOTE — Assessment & Plan Note (Signed)
Vascular plans yearly renal angiogram.

## 2021-07-26 NOTE — Assessment & Plan Note (Signed)
On maxide, HTN usually well controlled.

## 2021-07-26 NOTE — Assessment & Plan Note (Signed)
Stop nifedipine and change to losartan. Follow up in 2 week, re-eval Cr/K.

## 2021-07-26 NOTE — Assessment & Plan Note (Signed)
Likely multifactorial from diastolic dysfunction, vascular issues in right leg as well as possible med SE (nifedipine and /or tamoxifen)  Will try trial off nifedipine.  Follow up with vascular as planned, compression hose.

## 2021-07-26 NOTE — Assessment & Plan Note (Signed)
Acute, self limited... seem to happen in time of stress  After feeling " wiggling in head"  She reports she had high salt meal that night.  Improved with additional metoprolol 1/2 tablet.  Recent EKG reviewed.

## 2021-07-26 NOTE — Patient Instructions (Addendum)
Continue Maxide and  metoprolol.  Stop nifedipine and start losartan 50 mg daily  Check blood pressure daily goal < 140/90.

## 2021-07-26 NOTE — Progress Notes (Signed)
Patient ID: Kristy [name], female    DOB: May 24, 1941, 80 y.o.   MRN: [id number]  This visit was conducted in person.  BP 126/84   Pulse 66   Temp (!) 97.5 F (36.4 C) (Temporal)   Ht '5\' 2"'$  (1.575 m)   Wt 173 lb (78.5 kg)   SpO2 98%   BMI 31.64 kg/m    CC: Chief Complaint  Patient presents with   Tachycardia    Subjective:   HPI: Kristy [name] is a 80 y.o. female presenting on 07/26/2021 for Tachycardia  She has noted in last week HR going up to 124 with walking from room to room in house. It started  after felt like right eye weak.. felt foggy, felt wiggling in head.  BP that night was 150/80  Took an additional metoprolol. Next day she felt better  HR 90-100  No decongestant  She was having headache and congestion, COVID test was neg x 2...  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  She had not missed any meds, uses pill box.   Recent TSH , BNP normal   EKG 05/24/2021 [facility] 06/2021 EF 123XX123 % diastolic dysfunction. ECHO  Hypertension:    Well controlled on nifedipine, maxide and metoprolol. BP Readings from Last 3 Encounters:  07/26/21 126/84  06/09/21 137/73  05/24/21 118/70  Using medication without problems or lightheadedness:  Chest pain with exertion: Edema: Short of breath: Average home BPs: Other issues:      Relevant past medical, surgical, family and social history reviewed and updated as indicated. Interim medical history since our last visit reviewed. Allergies and medications reviewed and updated. Outpatient Medications Prior to Visit  Medication Sig Dispense Refill   aspirin EC 81 MG tablet Take 81 mg by mouth daily. Swallow whole.     atorvastatin (LIPITOR) 20 MG tablet TAKE 1 TABLET BY MOUTH  DAILY 90 tablet 3   Cholecalciferol (D3 2000) 50 MCG (2000 UT) CAPS Take by mouth daily.     erythromycin ophthalmic ointment SMARTSIG:Sparingly In Eye(s) 4 Times Daily     metoprolol succinate (TOPROL-XL) 100 MG 24 hr tablet TAKE 1 TABLET BY  MOUTH  DAILY WITH OR IMMEDIATELY  FOLLOWING A MEAL 90 tablet 3   neomycin-polymyxin b-dexamethasone (MAXITROL) 3.5-10000-0.1 SUSP Place 1 drop into the right eye 4 (four) times daily.     NIFEdipine (PROCARDIA XL/NIFEDICAL XL) 60 MG 24 hr tablet TAKE 1 TABLET BY MOUTH  DAILY 90 tablet 3   TAMOXIFEN CITRATE PO Take 20 mg by mouth daily.     triamterene-hydrochlorothiazide (MAXZIDE-25) 37.5-25 MG tablet TAKE 1 TABLET BY MOUTH  DAILY 90 tablet 3   valACYclovir (VALTREX) 500 MG tablet Take 1 tablet (500 mg total) by mouth daily. Take daily for suppressive therapy 30 tablet 11   No facility-administered medications prior to visit.     Per HPI unless specifically indicated in ROS section below Review of Systems  Constitutional:  Negative for fatigue and fever.  HENT:  Negative for congestion.   Eyes:  Negative for pain.  Respiratory:  Negative for cough.   Cardiovascular:  Positive for palpitations and leg swelling. Negative for chest pain.  Gastrointestinal:  Negative for abdominal pain.  Genitourinary:  Negative for dysuria and vaginal bleeding.  Musculoskeletal:  Negative for back pain.  Neurological:  Negative for syncope, light-headedness and headaches.  Psychiatric/Behavioral:  Negative for dysphoric mood.   Objective:  BP 126/84   Pulse 66   Temp (!) 97.5 F (  36.4 C) (Temporal)   Ht '5\' 2"'$  (1.575 m)   Wt 173 lb (78.5 kg)   SpO2 98%   BMI 31.64 kg/m   Wt Readings from Last 3 Encounters:  07/26/21 173 lb (78.5 kg)  06/09/21 171 lb (77.6 kg)  05/24/21 171 lb 12 oz (77.9 kg)      Physical Exam Constitutional:      General: She is not in acute distress.    Appearance: Normal appearance. She is well-developed. She is not ill-appearing or toxic-appearing.  HENT:     Head: Normocephalic.     Right Ear: Hearing, tympanic membrane, ear canal and external ear normal. Tympanic membrane is not erythematous, retracted or bulging.     Left Ear: Hearing, tympanic membrane, ear canal and  external ear normal. Tympanic membrane is not erythematous, retracted or bulging.     Nose: No mucosal edema or rhinorrhea.     Right Sinus: No maxillary sinus tenderness or frontal sinus tenderness.     Left Sinus: No maxillary sinus tenderness or frontal sinus tenderness.     Mouth/Throat:     Pharynx: Uvula midline.  Eyes:     General: Lids are normal. Lids are everted, no foreign bodies appreciated.     Conjunctiva/sclera: Conjunctivae normal.     Pupils: Pupils are equal, round, and reactive to light.  Neck:     Thyroid: No thyroid mass or thyromegaly.     Vascular: No carotid bruit.     Trachea: Trachea normal.  Cardiovascular:     Rate and Rhythm: Normal rate and regular rhythm.     Pulses: Normal pulses.     Heart sounds: Normal heart sounds, S1 normal and S2 normal. No murmur heard.   No friction rub. No gallop.     Comments:  Chronic swelling  in right leg> left  No pain in calves Pulmonary:     Effort: Pulmonary effort is normal. No tachypnea or respiratory distress.     Breath sounds: Normal breath sounds. No decreased breath sounds, wheezing, rhonchi or rales.  Abdominal:     General: Bowel sounds are normal.     Palpations: Abdomen is soft.     Tenderness: There is no abdominal tenderness.  Musculoskeletal:     Cervical back: Normal range of motion and neck supple.     Right lower leg: 1+ Edema present.  Skin:    General: Skin is warm and dry.     Findings: No rash.  Neurological:     Mental Status: She is alert.  Psychiatric:        Mood and Affect: Mood is not anxious or depressed.        Speech: Speech normal.        Behavior: Behavior normal. Behavior is cooperative.        Thought Content: Thought content normal.        Judgment: Judgment normal.      Results for orders placed or performed during the hospital encounter of 07/12/21  ECHOCARDIOGRAM COMPLETE  Result Value Ref Range   Ao pk vel 1.02 m/s   AV Area VTI 3.40 cm2   AR max vel 2.53 cm2   AV  Mean grad 2.0 mmHg   AV Peak grad 4.2 mmHg   S' Lateral 2.14 cm   AV Area mean vel 2.69 cm2   Area-P 1/2 2.71 cm2    This visit occurred during the SARS-CoV-2 public health emergency.  Safety protocols were in place, including screening  questions prior to the visit, additional usage of staff PPE, and extensive cleaning of exam room while observing appropriate contact time as indicated for disinfecting solutions.   COVID 19 screen:  No recent travel or known exposure to COVID19 The patient denies respiratory symptoms of COVID 19 at this time. The importance of social distancing was discussed today.   Assessment and Plan    Problem List Items Addressed This Visit     Diastolic dysfunction    On maxide, HTN usually well controlled.      HYPERTENSION, BENIGN ESSENTIAL, LABILE    Stop nifedipine and change to losartan. Follow up in 2 week, re-eval Cr/K.       Relevant Medications   aspirin EC 81 MG tablet   losartan (COZAAR) 50 MG tablet   Peripheral edema    Likely multifactorial from diastolic dysfunction, vascular issues in right leg as well as possible med SE (nifedipine and /or tamoxifen)  Will try trial off nifedipine.  Follow up with vascular as planned, compression hose.      Renal artery stenosis [facility])    Vascular plans yearly renal angiogram.      Relevant Medications   aspirin EC 81 MG tablet   losartan (COZAAR) 50 MG tablet   Tachycardia - Primary    Acute, self limited... seem to happen in time of stress  After feeling " wiggling in head"  She reports she had high salt meal that night.  Improved with additional metoprolol 1/2 tablet.  Recent EKG reviewed.        Eliezer Lofts, MD

## 2021-08-09 ENCOUNTER — Other Ambulatory Visit: Payer: Self-pay

## 2021-08-09 ENCOUNTER — Ambulatory Visit (INDEPENDENT_AMBULATORY_CARE_PROVIDER_SITE_OTHER): Payer: Medicare Other | Admitting: Family Medicine

## 2021-08-09 ENCOUNTER — Encounter: Payer: Self-pay | Admitting: Family Medicine

## 2021-08-09 VITALS — BP 102/68 | HR 65 | Temp 98.3°F | Ht 62.0 in | Wt 172.0 lb

## 2021-08-09 DIAGNOSIS — R000 Tachycardia, unspecified: Secondary | ICD-10-CM | POA: Diagnosis not present

## 2021-08-09 DIAGNOSIS — I10 Essential (primary) hypertension: Secondary | ICD-10-CM

## 2021-08-09 DIAGNOSIS — R609 Edema, unspecified: Secondary | ICD-10-CM | POA: Diagnosis not present

## 2021-08-09 NOTE — Assessment & Plan Note (Addendum)
Improved off CA channel blocker. If not fully resolved she will follow up with vascular and wear compression hose.

## 2021-08-09 NOTE — Assessment & Plan Note (Addendum)
Stable, chronic.  Continue current medication.   Losartan 50 mg daily... eval with BMET.  Metoprolol 25 mg daily.

## 2021-08-09 NOTE — Patient Instructions (Signed)
Please stop at the lab to have labs drawn.  

## 2021-08-09 NOTE — Progress Notes (Signed)
Patient ID: Kristy [name], female    DOB: December 26, 1940, 80 y.o.   MRN: [id number]  This visit was conducted in person.  BP 102/68   Pulse 65   Temp 98.3 F (36.8 C) (Temporal)   Ht 5\' 2"  (1.575 m)   Wt 172 lb (78 kg)   SpO2 96%   BMI 31.46 kg/m    CC: Chief Complaint  Patient presents with   2 Week Follow-up    Subjective:   HPI: Kristy [name] is a 80 y.o. female presenting on 08/09/2021 for 2 Week Follow-up  At last OV given peripheral edema.Marland Kitchen she was taken off nifedipine for HTN and placed on losartan. No SE.  She has noted significant improvement with peripheral swelling.  123/61 to 103/62 at home HR  69-79. BP Readings from Last 3 Encounters:  08/09/21 102/68  07/26/21 126/84  06/09/21 137/73   She has follow up with vascular and will start compression hose.  Tachycardia... seems to be self limited and in times of stress.  Uses metoprolol additional 1/2 tablet prn.  Only one episode of increased heart rate to 110. Wt Readings from Last 3 Encounters:  08/09/21 172 lb (78 kg)  07/26/21 173 lb (78.5 kg)  06/09/21 171 lb (77.6 kg)         Relevant past medical, surgical, family and social history reviewed and updated as indicated. Interim medical history since our last visit reviewed. Allergies and medications reviewed and updated. Outpatient Medications Prior to Visit  Medication Sig Dispense Refill   aspirin EC 81 MG tablet Take 81 mg by mouth daily. Swallow whole.     atorvastatin (LIPITOR) 20 MG tablet TAKE 1 TABLET BY MOUTH  DAILY 90 tablet 3   Cholecalciferol (D3 2000) 50 MCG (2000 UT) CAPS Take by mouth daily.     erythromycin ophthalmic ointment SMARTSIG:Sparingly In Eye(s) 4 Times Daily     losartan (COZAAR) 50 MG tablet Take 1 tablet (50 mg total) by mouth daily. 30 tablet 11   metoprolol succinate (TOPROL-XL) 100 MG 24 hr tablet TAKE 1 TABLET BY MOUTH  DAILY WITH OR IMMEDIATELY  FOLLOWING A MEAL 90 tablet 3   neomycin-polymyxin  b-dexamethasone (MAXITROL) 3.5-10000-0.1 SUSP Place 1 drop into the right eye 4 (four) times daily.     TAMOXIFEN CITRATE PO Take 20 mg by mouth daily.     triamterene-hydrochlorothiazide (MAXZIDE-25) 37.5-25 MG tablet TAKE 1 TABLET BY MOUTH  DAILY 90 tablet 3   valACYclovir (VALTREX) 500 MG tablet Take 1 tablet (500 mg total) by mouth daily. Take daily for suppressive therapy 30 tablet 11   No facility-administered medications prior to visit.     Per HPI unless specifically indicated in ROS section below Review of Systems  Constitutional:  Negative for fatigue and fever.  HENT:  Negative for ear pain.   Eyes:  Negative for pain.  Respiratory:  Negative for chest tightness and shortness of breath.   Cardiovascular:  Negative for chest pain, palpitations and leg swelling.  Gastrointestinal:  Negative for abdominal pain.  Genitourinary:  Negative for dysuria.  Objective:  BP 102/68   Pulse 65   Temp 98.3 F (36.8 C) (Temporal)   Ht 5\' 2"  (1.575 m)   Wt 172 lb (78 kg)   SpO2 96%   BMI 31.46 kg/m   Wt Readings from Last 3 Encounters:  08/09/21 172 lb (78 kg)  07/26/21 173 lb (78.5 kg)  06/09/21 171 lb (77.6 kg)  Physical Exam Constitutional:      General: She is not in acute distress.    Appearance: Normal appearance. She is well-developed. She is not ill-appearing or toxic-appearing.  HENT:     Head: Normocephalic.     Right Ear: Hearing, tympanic membrane, ear canal and external ear normal. Tympanic membrane is not erythematous, retracted or bulging.     Left Ear: Hearing, tympanic membrane, ear canal and external ear normal. Tympanic membrane is not erythematous, retracted or bulging.     Nose: No mucosal edema or rhinorrhea.     Right Sinus: No maxillary sinus tenderness or frontal sinus tenderness.     Left Sinus: No maxillary sinus tenderness or frontal sinus tenderness.     Mouth/Throat:     Pharynx: Uvula midline.  Eyes:     General: Lids are normal. Lids are  everted, no foreign bodies appreciated.     Conjunctiva/sclera: Conjunctivae normal.     Pupils: Pupils are equal, round, and reactive to light.  Neck:     Thyroid: No thyroid mass or thyromegaly.     Vascular: No carotid bruit.     Trachea: Trachea normal.  Cardiovascular:     Rate and Rhythm: Normal rate and regular rhythm.     Pulses: Normal pulses.     Heart sounds: Normal heart sounds, S1 normal and S2 normal. No murmur heard.   No friction rub. No gallop.  Pulmonary:     Effort: Pulmonary effort is normal. No tachypnea or respiratory distress.     Breath sounds: Normal breath sounds. No decreased breath sounds, wheezing, rhonchi or rales.  Abdominal:     General: Bowel sounds are normal.     Palpations: Abdomen is soft.     Tenderness: There is no abdominal tenderness.  Musculoskeletal:     Cervical back: Normal range of motion and neck supple.  Skin:    General: Skin is warm and dry.     Findings: No rash.  Neurological:     Mental Status: She is alert.  Psychiatric:        Mood and Affect: Mood is not anxious or depressed.        Speech: Speech normal.        Behavior: Behavior normal. Behavior is cooperative.        Thought Content: Thought content normal.        Judgment: Judgment normal.      Results for orders placed or performed during the hospital encounter of 07/12/21  ECHOCARDIOGRAM COMPLETE  Result Value Ref Range   Ao pk vel 1.02 m/s   AV Area VTI 3.40 cm2   AR max vel 2.53 cm2   AV Mean grad 2.0 mmHg   AV Peak grad 4.2 mmHg   S' Lateral 2.14 cm   AV Area mean vel 2.69 cm2   Area-P 1/2 2.71 cm2    This visit occurred during the SARS-CoV-2 public health emergency.  Safety protocols were in place, including screening questions prior to the visit, additional usage of staff PPE, and extensive cleaning of exam room while observing appropriate contact time as indicated for disinfecting solutions.   COVID 19 screen:  No recent travel or known exposure to  COVID19 The patient denies respiratory symptoms of COVID 19 at this time. The importance of social distancing was discussed today.   Assessment and Plan Problem List Items Addressed This Visit     HYPERTENSION, BENIGN ESSENTIAL, LABILE    Stable, chronic.  Continue current  medication.   Losartan 50 mg daily... eval with BMET.  Metoprolol 25 mg daily.      Relevant Orders   Basic Metabolic Panel   Peripheral edema    Improved off CA channel blocker. If not fully resolved she will follow up with vascular and wear compression hose.      Tachycardia - Primary    Minimal issue since last OV using metoprolol 25 mg daily.       Orders Placed This Encounter  Procedures   Basic Metabolic Panel       Eliezer Lofts, MD

## 2021-08-09 NOTE — Assessment & Plan Note (Signed)
Minimal issue since last OV using metoprolol 25 mg daily.

## 2021-08-10 LAB — BASIC METABOLIC PANEL
BUN: 14 mg/dL (ref 6–23)
CO2: 27 mEq/L (ref 19–32)
Calcium: 9.9 mg/dL (ref 8.4–10.5)
Chloride: 103 mEq/L (ref 96–112)
Creatinine, Ser: 0.82 mg/dL (ref 0.40–1.20)
GFR: 68.62 mL/min (ref >60.00)
Glucose, Bld: 84 mg/dL (ref 70–99)
Potassium: 3.6 mEq/L (ref 3.5–5.1)
Sodium: 138 mEq/L (ref 135–145)

## 2021-08-18 ENCOUNTER — Telehealth: Payer: Self-pay | Admitting: Family Medicine

## 2021-08-18 MED ORDER — LOSARTAN POTASSIUM 50 MG PO TABS: 50.0000 mg | ORAL_TABLET | Freq: Every day | ORAL | 3 refills | Status: DC

## 2021-08-18 NOTE — Telephone Encounter (Signed)
Refills sent as requested

## 2021-08-18 NOTE — Telephone Encounter (Signed)
  Encourage patient to contact the pharmacy for refills or they can request refills through [facility]:  Please schedule appointment if longer than 1 year  NEXT APPOINTMENT DATE:03/06/2022  MEDICATION:losartan (COZAAR) 50 MG tablet  Is the patient out of medication? yes  PHARMACY:[facility]  ([place], [place]  Let patient know to contact pharmacy at the end of the day to make sure medication is ready.  Please notify patient to allow 48-72 hours to process  CLINICAL FILLS OUT ALL BELOW:   LAST REFILL:  QTY:  REFILL DATE:    OTHER COMMENTS:    Okay for refill?  Please advise

## 2021-08-23 ENCOUNTER — Ambulatory Visit: Payer: Self-pay | Admitting: Surgery

## 2021-08-23 DIAGNOSIS — Z170 Estrogen receptor positive status [ER+]: Secondary | ICD-10-CM | POA: Diagnosis not present

## 2021-08-23 DIAGNOSIS — K8010 Calculus of gallbladder with chronic cholecystitis without obstruction: Secondary | ICD-10-CM | POA: Diagnosis not present

## 2021-08-23 DIAGNOSIS — C50411 Malignant neoplasm of upper-outer quadrant of right female breast: Secondary | ICD-10-CM | POA: Diagnosis not present

## 2021-08-24 ENCOUNTER — Encounter: Payer: Self-pay | Admitting: Gastroenterology

## 2021-09-14 ENCOUNTER — Encounter (HOSPITAL_COMMUNITY): Payer: Self-pay

## 2021-09-14 NOTE — Progress Notes (Signed)
Surgical Instructions    Your procedure is scheduled on 09/22/21.  Report to [facility] Main Entrance "A" at 5:30 A.M., then check in with the Admitting office.  Call this number if you have problems the morning of surgery:  [phone]   If you have any questions prior to your surgery date call [phone]: Open Monday-Friday 8am-4pm    Remember:  Do not eat after midnight the night before your surgery  You may drink clear liquids until 4:30am the morning of your surgery.   Clear liquids allowed are: Water, Non-Citrus Juices (without pulp), Carbonated Beverages, Clear Tea, Black Coffee ONLY (NO MILK, CREAM OR POWDERED CREAMER of any kind), and Gatorade    Take these medicines the morning of surgery with A SIP OF WATER  atorvastatin (LIPITOR) metoprolol succinate (TOPROL-XL)  Naphazoline HCl (CLEAR EYES OP) if needed sodium chloride (OCEAN) nasal spray tamoxifen (NOLVADEX) valACYclovir (VALTREX) if needed  As of today, STOP taking any Aspirin (unless otherwise instructed by your surgeon) Aleve, Naproxen, Ibuprofen, Motrin, Advil, Goody's, BC's, all herbal medications, fish oil, and all vitamins.     After your COVID test   You are not required to quarantine however you are required to wear a well-fitting mask when you are out and around people not in your household.  If your mask becomes wet or soiled, replace with a new one.  Wash your hands often with soap and water for 20 seconds or clean your hands with an alcohol-based hand sanitizer that contains at least 60% alcohol.  Do not share personal items.  Notify your provider: if you are in close contact with someone who has COVID  or if you develop a fever of 100.4 or greater, sneezing, cough, sore throat, shortness of breath or body aches.             Do not wear jewelry or makeup Do not wear lotions, powders, perfumes/colognes, or deodorant. Do not shave 48 hours prior to surgery.  Men may shave face and neck. Do  not bring valuables to the hospital. DO Not wear nail polish, gel polish, artificial nails, or any other type of covering on natural nails including finger and toenails. If patients have artificial nails, gel coating, etc. that need to be removed by a nail salon, please have this removed prior to surgery or surgery may need to be canceled/delayed if the surgeon/ anesthesia feels like the patient is unable to be adequately monitored.             [facility] is not responsible for any belongings or valuables.  Do NOT Smoke (Tobacco/Vaping)  24 hours prior to your procedure  If you use a CPAP at night, you may bring your mask for your overnight stay.   Contacts, glasses, hearing aids, dentures or partials may not be worn into surgery, please bring cases for these belongings   For patients admitted to the hospital, discharge time will be determined by your treatment team.   Patients discharged the day of surgery will not be allowed to drive home, and someone needs to stay with them for 24 hours.  NO VISITORS WILL BE ALLOWED IN PRE-OP WHERE PATIENTS ARE PREPPED FOR SURGERY.  ONLY 1 SUPPORT PERSON MAY BE PRESENT IN THE WAITING ROOM WHILE YOU ARE IN SURGERY.  IF YOU ARE TO BE ADMITTED, ONCE YOU ARE IN YOUR ROOM YOU WILL BE ALLOWED TWO (2) VISITORS. 1 (ONE) VISITOR MAY STAY OVERNIGHT BUT MUST ARRIVE TO THE ROOM BY 8pm.  Minor children may have two parents present. Special consideration for safety and communication needs will be reviewed on a case by case basis.  Special instructions:    Oral Hygiene is also important to reduce your risk of infection.  Remember - BRUSH YOUR TEETH THE MORNING OF SURGERY WITH YOUR REGULAR TOOTHPASTE   [facility]- Preparing For Surgery  Before surgery, you can play an important role. Because skin is not sterile, your skin needs to be as free of germs as possible. You can reduce the number of germs on your skin by washing with CHG (chlorahexidine gluconate) Soap before  surgery.  CHG is an antiseptic cleaner which kills germs and bonds with the skin to continue killing germs even after washing.     Please do not use if you have an allergy to CHG or antibacterial soaps. If your skin becomes reddened/irritated stop using the CHG.  Do not shave (including legs and underarms) for at least 48 hours prior to first CHG shower. It is OK to shave your face.  Please follow these instructions carefully.     Shower the NIGHT BEFORE SURGERY and the MORNING OF SURGERY with CHG Soap.   If you chose to wash your hair, wash your hair first as usual with your normal shampoo. After you shampoo, rinse your hair and body thoroughly to remove the shampoo.  Then [employer] and genitals (private parts) with your normal soap and rinse thoroughly to remove soap.  After that Use CHG Soap as you would any other liquid soap. You can apply CHG directly to the skin and wash gently with a scrungie or a clean washcloth.   Apply the CHG Soap to your body ONLY FROM THE NECK DOWN.  Do not use on open wounds or open sores. Avoid contact with your eyes, ears, mouth and genitals (private parts). Wash Face and genitals (private parts)  with your normal soap.   Wash thoroughly, paying special attention to the area where your surgery will be performed.  Thoroughly rinse your body with warm water from the neck down.  DO NOT shower/wash with your normal soap after using and rinsing off the CHG Soap.  Pat yourself dry with a CLEAN TOWEL.  Wear CLEAN PAJAMAS to bed the night before surgery  Place CLEAN SHEETS on your bed the night before your surgery  DO NOT SLEEP WITH PETS.   Day of Surgery: Take a shower with CHG soap. Wear Clean/Comfortable clothing the morning of surgery Do not apply any deodorants/lotions.   Remember to brush your teeth WITH YOUR REGULAR TOOTHPASTE.   Please read over the following fact sheets that you were given.

## 2021-09-15 ENCOUNTER — Encounter (HOSPITAL_COMMUNITY)
Admission: RE | Admit: 2021-09-15 | Discharge: 2021-09-15 | Disposition: A | Discharge status: Home / Self Care | Payer: Medicare Other | Source: Ambulatory Visit | Attending: Surgery | Admitting: Surgery

## 2021-09-15 ENCOUNTER — Other Ambulatory Visit: Payer: Self-pay

## 2021-09-15 ENCOUNTER — Encounter (HOSPITAL_COMMUNITY): Payer: Self-pay

## 2021-09-15 DIAGNOSIS — Z853 Personal history of malignant neoplasm of breast: Secondary | ICD-10-CM | POA: Insufficient documentation

## 2021-09-15 DIAGNOSIS — Z923 Personal history of irradiation: Secondary | ICD-10-CM | POA: Diagnosis not present

## 2021-09-15 DIAGNOSIS — K8020 Calculus of gallbladder without cholecystitis without obstruction: Secondary | ICD-10-CM | POA: Insufficient documentation

## 2021-09-15 DIAGNOSIS — K219 Gastro-esophageal reflux disease without esophagitis: Secondary | ICD-10-CM | POA: Insufficient documentation

## 2021-09-15 DIAGNOSIS — E785 Hyperlipidemia, unspecified: Secondary | ICD-10-CM | POA: Diagnosis not present

## 2021-09-15 DIAGNOSIS — I129 Hypertensive chronic kidney disease with stage 1 through stage 4 chronic kidney disease, or unspecified chronic kidney disease: Secondary | ICD-10-CM | POA: Insufficient documentation

## 2021-09-15 DIAGNOSIS — N189 Chronic kidney disease, unspecified: Secondary | ICD-10-CM | POA: Diagnosis not present

## 2021-09-15 DIAGNOSIS — Z01812 Encounter for preprocedural laboratory examination: Secondary | ICD-10-CM | POA: Diagnosis not present

## 2021-09-15 DIAGNOSIS — Z79810 Long term (current) use of selective estrogen receptor modulators (SERMs): Secondary | ICD-10-CM | POA: Insufficient documentation

## 2021-09-15 HISTORY — DX: Dyspnea, unspecified: R06.00

## 2021-09-15 LAB — COMPREHENSIVE METABOLIC PANEL
ALT: 15 U/L (ref 0–44)
AST: 18 U/L (ref 15–41)
Albumin: 3.7 g/dL (ref 3.5–5.0)
Alkaline Phosphatase: 42 U/L (ref 38–126)
Anion gap: 7 (ref 5–15)
BUN: 13 mg/dL (ref 8–23)
CO2: 27 mmol/L (ref 22–32)
Calcium: 9.8 mg/dL (ref 8.9–10.3)
Chloride: 105 mmol/L (ref 98–111)
Creatinine, Ser: 0.88 mg/dL (ref 0.44–1.00)
GFR, Estimated: >60 mL/min (ref >60)
Glucose, Bld: 98 mg/dL (ref 70–99)
Potassium: 3.8 mmol/L (ref 3.5–5.1)
Sodium: 139 mmol/L (ref 135–145)
Total Bilirubin: 0.4 mg/dL (ref 0.3–1.2)
Total Protein: 6.9 g/dL (ref 6.5–8.1)

## 2021-09-15 LAB — CBC WITH DIFFERENTIAL/PLATELET
Abs Immature Granulocytes: 0.01 10*3/uL (ref 0.00–0.07)
Basophils Absolute: 0 10*3/uL (ref 0.0–0.1)
Basophils Relative: 1 %
Eosinophils Absolute: 0.1 10*3/uL (ref 0.0–0.5)
Eosinophils Relative: 2 %
HCT: 39 % (ref 36.0–46.0)
Hemoglobin: 13.1 g/dL (ref 12.0–15.0)
Immature Granulocytes: 0 %
Lymphocytes Relative: 45 %
Lymphs Abs: 3.3 10*3/uL (ref 0.7–4.0)
MCH: 32.6 pg (ref 26.0–34.0)
MCHC: 33.6 g/dL (ref 30.0–36.0)
MCV: 97 fL (ref 80.0–100.0)
Monocytes Absolute: 0.7 10*3/uL (ref 0.1–1.0)
Monocytes Relative: 9 %
Neutro Abs: 3.1 10*3/uL (ref 1.7–7.7)
Neutrophils Relative %: 43 %
Platelets: 297 10*3/uL (ref 150–400)
RBC: 4.02 MIL/uL (ref 3.87–5.11)
RDW: 12.7 % (ref 11.5–15.5)
WBC: 7.3 10*3/uL (ref 4.0–10.5)
nRBC: 0 % (ref 0.0–0.2)

## 2021-09-15 NOTE — Progress Notes (Signed)
PCP - Eliezer Lofts, MD Cardiologist - denies Oncologist - Magrinat, Sarajane Jews, MD  PPM/ICD - denies Device Orders - n/a Rep Notified - n/a  Chest x-ray - n/a EKG - 05/24/2021 Stress Test - 05/14/2014 ECHO - 07/12/2021 Cardiac Cath - denies  Sleep Study - denies CPAP - n/a  Fasting Blood Sugar - n/a  Blood Thinner Instructions: n/a  Patient was instructed: As of today, STOP taking any Aspirin (unless otherwise instructed by your surgeon) Aleve, Naproxen, Ibuprofen, Motrin, Advil, Goody's, BC's, all herbal medications, fish oil, and all vitamins.  Patient was instructed to ask MD if she must hold Tamoxifen before surgery. Patient verbalized understanding   ERAS Protcol - yes PRE-SURGERY Ensure or G2- no  COVID TEST- no - ambulatory surgery   Anesthesia review: yes - history of tachycardia, SOB; patient taking Tamoxifen   Patient denies shortness of breath, fever, cough and chest pain at PAT appointment   All instructions explained to the patient, with a verbal understanding of the material. Patient agrees to go over the instructions while at home for a better understanding. Patient also instructed to self quarantine after being tested for COVID-19. The opportunity to ask questions was provided.

## 2021-09-15 NOTE — Progress Notes (Signed)
Surgical Instructions    Your procedure is scheduled on 09/22/21.  Report to [facility] Main Entrance "A" at 5:30 A.M., then check in with the Admitting office.  Call this number if you have problems the morning of surgery:  [phone]   If you have any questions prior to your surgery date call [phone]: Open Monday-Friday 8am-4pm    Remember:  Do not eat after midnight the night before your surgery  You may drink clear liquids until 4:30am the morning of your surgery.   Clear liquids allowed are: Water, Non-Citrus Juices (without pulp), Carbonated Beverages, Clear Tea, Black Coffee ONLY (NO MILK, CREAM OR POWDERED CREAMER of any kind), and Gatorade    Take these medicines the morning of surgery with A SIP OF WATER  atorvastatin (LIPITOR) metoprolol succinate (TOPROL-XL)  Naphazoline HCl (CLEAR EYES OP) if needed sodium chloride (OCEAN) nasal spray valACYclovir (VALTREX) if needed  As of today, STOP taking any Aspirin (unless otherwise instructed by your surgeon) Aleve, Naproxen, Ibuprofen, Motrin, Advil, Goody's, BC's, all herbal medications, fish oil, and all vitamins.  Ask MD if you must hold tamoxifen (NOLVADEX) prior surgery.   After your COVID test   You are not required to quarantine however you are required to wear a well-fitting mask when you are out and around people not in your household.  If your mask becomes wet or soiled, replace with a new one.  Wash your hands often with soap and water for 20 seconds or clean your hands with an alcohol-based hand sanitizer that contains at least 60% alcohol.  Do not share personal items.  Notify your provider: if you are in close contact with someone who has COVID  or if you develop a fever of 100.4 or greater, sneezing, cough, sore throat, shortness of breath or body aches.             Do not wear jewelry or makeup Do not wear lotions, powders, perfumes/colognes, or deodorant. Do not shave 48 hours prior to surgery.   Men may shave face and neck. Do not bring valuables to the hospital. DO Not wear nail polish, gel polish, artificial nails, or any other type of covering on natural nails including finger and toenails. If patients have artificial nails, gel coating, etc. that need to be removed by a nail salon, please have this removed prior to surgery or surgery may need to be canceled/delayed if the surgeon/ anesthesia feels like the patient is unable to be adequately monitored.             [facility] is not responsible for any belongings or valuables.  Do NOT Smoke (Tobacco/Vaping)  24 hours prior to your procedure  If you use a CPAP at night, you may bring your mask for your overnight stay.   Contacts, glasses, hearing aids, dentures or partials may not be worn into surgery, please bring cases for these belongings   For patients admitted to the hospital, discharge time will be determined by your treatment team.   Patients discharged the day of surgery will not be allowed to drive home, and someone needs to stay with them for 24 hours.  NO VISITORS WILL BE ALLOWED IN PRE-OP WHERE PATIENTS ARE PREPPED FOR SURGERY.  ONLY 1 SUPPORT PERSON MAY BE PRESENT IN THE WAITING ROOM WHILE YOU ARE IN SURGERY.  IF YOU ARE TO BE ADMITTED, ONCE YOU ARE IN YOUR ROOM YOU WILL BE ALLOWED TWO (2) VISITORS. 1 (ONE) VISITOR MAY STAY OVERNIGHT BUT MUST  ARRIVE TO THE ROOM BY 8pm.  Minor children may have two parents present. Special consideration for safety and communication needs will be reviewed on a case by case basis.  Special instructions:    Oral Hygiene is also important to reduce your risk of infection.  Remember - BRUSH YOUR TEETH THE MORNING OF SURGERY WITH YOUR REGULAR TOOTHPASTE   [facility]- Preparing For Surgery  Before surgery, you can play an important role. Because skin is not sterile, your skin needs to be as free of germs as possible. You can reduce the number of germs on your skin by washing with CHG  (chlorahexidine gluconate) Soap before surgery.  CHG is an antiseptic cleaner which kills germs and bonds with the skin to continue killing germs even after washing.     Please do not use if you have an allergy to CHG or antibacterial soaps. If your skin becomes reddened/irritated stop using the CHG.  Do not shave (including legs and underarms) for at least 48 hours prior to first CHG shower. It is OK to shave your face.  Please follow these instructions carefully.     Shower the NIGHT BEFORE SURGERY and the MORNING OF SURGERY with CHG Soap.   If you chose to wash your hair, wash your hair first as usual with your normal shampoo. After you shampoo, rinse your hair and body thoroughly to remove the shampoo.  Then [employer] and genitals (private parts) with your normal soap and rinse thoroughly to remove soap.  After that Use CHG Soap as you would any other liquid soap. You can apply CHG directly to the skin and wash gently with a scrungie or a clean washcloth.   Apply the CHG Soap to your body ONLY FROM THE NECK DOWN.  Do not use on open wounds or open sores. Avoid contact with your eyes, ears, mouth and genitals (private parts). Wash Face and genitals (private parts)  with your normal soap.   Wash thoroughly, paying special attention to the area where your surgery will be performed.  Thoroughly rinse your body with warm water from the neck down.  DO NOT shower/wash with your normal soap after using and rinsing off the CHG Soap.  Pat yourself dry with a CLEAN TOWEL.  Wear CLEAN PAJAMAS to bed the night before surgery  Place CLEAN SHEETS on your bed the night before your surgery  DO NOT SLEEP WITH PETS.   Day of Surgery: Take a shower with CHG soap. Wear Clean/Comfortable clothing the morning of surgery Do not apply any deodorants/lotions.   Remember to brush your teeth WITH YOUR REGULAR TOOTHPASTE.   Please read over the following fact sheets that you were given.

## 2021-09-16 ENCOUNTER — Telehealth: Payer: Self-pay

## 2021-09-16 NOTE — Telephone Encounter (Signed)
Pt called to make [place] aware she will have a cholecystectomy 11/3; pt asks if she should stop Tamoxifen. Pt was given instructions to stop Tamoxifen now then restart one week post-surg. Pt verbalized understanding of instructions.

## 2021-09-16 NOTE — Progress Notes (Signed)
Anesthesia Chart Review:  Case: [id number] Date/Time: 09/22/21 0715   Procedure: LAPAROSCOPIC CHOLECYSTECTOMY WITH POSSIBLE INTRAOPERATIVE CHOLANGIOGRAM   Anesthesia type: General   Pre-op diagnosis: GALLSTONES   Location: [facility] OR ROOM 01 / [facility]   Surgeons: [name], MD       DISCUSSION: Patient is a 80 year old female scheduled for the above procedure.   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  Tamoxifen held starting 09/16/21 and to resume one week post-op.  Anesthesia team to evaluate on the day of surgery.   VS: BP (!) 153/75   Pulse 74   Temp 36.7 C (Oral)   Resp 18   Ht 5\' 2"  (1.575 m)   Wt 79.7 kg   SpO2 100%   BMI 32.15 kg/m    PROVIDERS: Jinny Sanders, MD is PCP. Last visit 08/09/21. Tachycardia improved on metoprolol. Peripheral edema improved off CCB. BP stable. Normal LVEF and no significant valve disease on 06/2021 echo. Lurline Del, MD is Baker Pierini, MD is RAD-ONC.  Hortencia Pilar, MD is vascular surgeon She is not followed by cardiology, but had a non-ischemic stress test per Ida Rogue, MD on 05/14/14.  LABS: Labs reviewed: Acceptable for surgery. (all labs ordered are listed, but only abnormal results are displayed)  Labs Reviewed  CBC WITH DIFFERENTIAL/PLATELET  COMPREHENSIVE METABOLIC PANEL    EKG: 06/20/81: Sinus  Rhythm  Voltage criteria for LVH  (R(I)+S(III) exceeds 2.50 mV)  -Voltage criteria w/o ST/T abnormality may be normal.   -Old anteroseptal infarct.    CV: Echo 07/12/21: IMPRESSIONS   1. Left ventricular ejection fraction, by estimation, is 60 to 65%. The  left ventricle has normal function. The left ventricle has no regional  wall motion abnormalities. Left ventricular  diastolic parameters are  consistent with Grade I diastolic  dysfunction (impaired relaxation). The average left ventricular global  longitudinal strain is -18.9 %. The global longitudinal strain is normal.   2. Right ventricular systolic function is normal. The right ventricular  size is normal. Tricuspid regurgitation signal is inadequate for assessing  PA pressure.   3. The mitral valve is normal in structure. No evidence of mitral valve  regurgitation. No evidence of mitral stenosis.   4. The aortic valve is tricuspid. Aortic valve regurgitation is not  visualized. No aortic stenosis is present.    US Carotid 06/09/21: Summary:  Right Carotid: There is no evidence of stenosis in the right ICA.  Left Carotid: There is no evidence of stenosis in the left ICA.  Vertebrals:  Bilateral vertebral arteries demonstrate antegrade flow.  Subclavians: Normal flow hemodynamics were seen in bilateral subclavian arteries.  (CTA neck 01/05/10: Findings within the LICA which appear to reflect the sequela of fibromuscular dysplasia 50-65% stenosis. No dissection.)   US Renal Artery 06/09/21: Summary:  Renal:  Right: Normal size right kidney. Normal right Resisitive Index.         Normal cortical thickness of right kidney. 1-59% stenosis of         the right renal artery. RRV flow present.  Left:  Normal size of left kidney. Normal left Resistive Index.         Normal cortical thickness of the left kidney. No evidence of         left renal artery stenosis. LRV flow  present.      Nuclear stress test 05/14/14: Summary   1. Exercise myocardial perfusion study with no significant ischemia.   2. No significant wall motion abnormality noted.   3. Overall, this is a Low risk scan.   4. The estimated ejection fraction is 74%.   5. The left ventricular global function was normal.   6. There are EKG changes concerning for ischemia. There was 1 mm of  horizontal ST depression in inferior and  inferolateral leads likely false  positive given normal perfusion.   7. There is GI uptake artifact noted on this study.  Per Dr. 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   Constipation    uses stool softener PRN- uses once a week to once every 2 weeks    Dyspnea    Fibromuscular dysplasia ([facility])    Gallbladder disease    GERD (gastroesophageal reflux disease)    Hyperlipidemia    Hypertension    For 16 years; urinary catecholamines within normal limits 12/09; renal Doppler ultrasound showed no evidence for renal artery stenosis   Normal echocardiogram 02/2009   LVEF 65%; no regional wall motion abnormalities; normal RV size and function; pulmonic valve had increased gradient across w/ peak gradient of about 36 mmHg (range of moderate pulmonic stenosis) followup showed normal valve   Papilloma of breast    right   Tinnitus of left ear     Past Surgical History:  Procedure Laterality Date   ANGIOPLASTY     2012   BREAST BIOPSY Right 2006   benign   BREAST EXCISIONAL BIOPSY Right 07/2017   benign   BREAST LUMPECTOMY WITH RADIOACTIVE SEED LOCALIZATION Right 08/14/2017   Procedure: RIGHT BREAST LUMPECTOMY WITH RADIOACTIVE SEED LOCALIZATION;  Surgeon: [name], MD;  Location: [facility];  Service: General;  Laterality: Right;   BREAST LUMPECTOMY WITH RADIOACTIVE SEED LOCALIZATION Right 12/16/2020   Procedure: RADIOACTIVE SEED GUIDED TIMES 3 RIGHT BREAST LUMPECTOMY;   Surgeon: [name], MD;  Location: [facility];  Service: General;  Laterality: Right;   Cardiolyte  11/07   Neg   COLONOSCOPY     KNEE CARTILAGE SURGERY     right knee   POLYPECTOMY     RE-EXCISION OF BREAST LUMPECTOMY Right 01/11/2021   Procedure: RE-EXCISION OF RIGHT BREAST LUMPECTOMY;  Surgeon: [name], MD;  Location: [facility];  Service: General;  Laterality: Right;   TOTAL ABDOMINAL HYSTERECTOMY     no cervix   UPPER GASTROINTESTINAL ENDOSCOPY      MEDICATIONS:  atorvastatin (LIPITOR) 20 MG tablet   Cholecalciferol (D3 2000) 50 MCG (2000 UT) CAPS   losartan (COZAAR) 50 MG tablet   metoprolol succinate (TOPROL-XL) 100 MG 24 hr tablet   Naphazoline HCl (CLEAR EYES OP)   sodium chloride (OCEAN) 0.65 % SOLN nasal spray   tamoxifen (NOLVADEX) 20 MG tablet   triamterene-hydrochlorothiazide (MAXZIDE-25) 37.5-25 MG tablet  valACYclovir (VALTREX) 500 MG tablet   No current facility-administered medications for this encounter.    Myra Gianotti, PA-C Surgical Short Stay/Anesthesiology [facility] Phone [phone] [facility] Phone [phone] 09/16/2021 6:28 PM

## 2021-09-16 NOTE — Anesthesia Preprocedure Evaluation (Addendum)
Anesthesia Evaluation  Patient identified by MRN, date of birth, ID band Patient awake    Reviewed: Allergy & Precautions, NPO status , Patient's Chart, lab work & pertinent test results  History of Anesthesia Complications Negative for: history of anesthetic complications  Airway Mallampati: II  TM Distance: >3 FB Neck ROM: Full    Dental  (+) Dental Advisory Given, Teeth Intact, Missing   Pulmonary neg pulmonary ROS, former smoker,    Pulmonary exam normal        Cardiovascular hypertension, Pt. on medications and Pt. on home beta blockers + Peripheral Vascular Disease (fibromuscular dysplasia)  Normal cardiovascular exam   Echo 07/12/21: EF 60-65%, no RWMA, g1dd, normal RVSF, normal MV/AV Normal stress test 2015   Neuro/Psych Carotid stenosis negative neurological ROS     GI/Hepatic Neg liver ROS, GERD  ,  Endo/Other  negative endocrine ROS  Renal/GU Renal disease (renal artery stenosis; Cr 0.88)  negative genitourinary   Musculoskeletal negative musculoskeletal ROS (+)   Abdominal   Peds  Hematology  breast cancer s/p lumpectomy/XRT/tamoxifen   Anesthesia Other Findings   Reproductive/Obstetrics                          Anesthesia Physical Anesthesia Plan  ASA: 3  Anesthesia Plan: General   Post-op Pain Management:    Induction: Intravenous  PONV Risk Score and Plan: 4 or greater and Ondansetron, Dexamethasone, Treatment may vary due to age or medical condition and Midazolam  Airway Management Planned: Oral ETT  Additional Equipment: None  Intra-op Plan:   Post-operative Plan: Extubation in OR  Informed Consent: I have reviewed the patients History and Physical, chart, labs and discussed the procedure including the risks, benefits and alternatives for the proposed anesthesia with the patient or authorized representative who has indicated his/her understanding and  acceptance.     Dental advisory given  Plan Discussed with:   Anesthesia Plan Comments: (PAT note written 09/16/2021 by Myra Gianotti, PA-C. )      Anesthesia Quick Evaluation

## 2021-09-22 ENCOUNTER — Ambulatory Visit (HOSPITAL_COMMUNITY): Payer: Medicare Other | Admitting: Vascular Surgery

## 2021-09-22 ENCOUNTER — Ambulatory Visit (HOSPITAL_COMMUNITY): Payer: Medicare Other

## 2021-09-22 ENCOUNTER — Encounter (HOSPITAL_COMMUNITY)
Admission: RE | Disposition: A | Discharge status: Home / Self Care | Payer: Self-pay | Source: Ambulatory Visit | Attending: Surgery

## 2021-09-22 ENCOUNTER — Encounter (HOSPITAL_COMMUNITY): Payer: Self-pay | Admitting: Surgery

## 2021-09-22 ENCOUNTER — Ambulatory Visit (HOSPITAL_COMMUNITY): Payer: Medicare Other | Admitting: Anesthesiology

## 2021-09-22 ENCOUNTER — Ambulatory Visit (HOSPITAL_COMMUNITY)
Admission: RE | Admit: 2021-09-22 | Discharge: 2021-09-22 | Disposition: A | Discharge status: Home / Self Care | Payer: Medicare Other | Source: Ambulatory Visit | Attending: Surgery | Admitting: Surgery

## 2021-09-22 ENCOUNTER — Other Ambulatory Visit: Payer: Self-pay

## 2021-09-22 DIAGNOSIS — Z79899 Other long term (current) drug therapy: Secondary | ICD-10-CM | POA: Diagnosis not present

## 2021-09-22 DIAGNOSIS — Z87891 Personal history of nicotine dependence: Secondary | ICD-10-CM | POA: Insufficient documentation

## 2021-09-22 DIAGNOSIS — Z170 Estrogen receptor positive status [ER+]: Secondary | ICD-10-CM | POA: Insufficient documentation

## 2021-09-22 DIAGNOSIS — Z7982 Long term (current) use of aspirin: Secondary | ICD-10-CM | POA: Diagnosis not present

## 2021-09-22 DIAGNOSIS — Z791 Long term (current) use of non-steroidal anti-inflammatories (NSAID): Secondary | ICD-10-CM | POA: Insufficient documentation

## 2021-09-22 DIAGNOSIS — Z9049 Acquired absence of other specified parts of digestive tract: Secondary | ICD-10-CM | POA: Diagnosis not present

## 2021-09-22 DIAGNOSIS — C50411 Malignant neoplasm of upper-outer quadrant of right female breast: Secondary | ICD-10-CM | POA: Insufficient documentation

## 2021-09-22 DIAGNOSIS — I10 Essential (primary) hypertension: Secondary | ICD-10-CM | POA: Diagnosis not present

## 2021-09-22 DIAGNOSIS — J309 Allergic rhinitis, unspecified: Secondary | ICD-10-CM | POA: Diagnosis not present

## 2021-09-22 DIAGNOSIS — K8010 Calculus of gallbladder with chronic cholecystitis without obstruction: Secondary | ICD-10-CM | POA: Insufficient documentation

## 2021-09-22 DIAGNOSIS — Z881 Allergy status to other antibiotic agents status: Secondary | ICD-10-CM | POA: Diagnosis not present

## 2021-09-22 DIAGNOSIS — E785 Hyperlipidemia, unspecified: Secondary | ICD-10-CM | POA: Diagnosis not present

## 2021-09-22 DIAGNOSIS — Z419 Encounter for procedure for purposes other than remedying health state, unspecified: Secondary | ICD-10-CM

## 2021-09-22 HISTORY — PX: CHOLECYSTECTOMY: SHX55

## 2021-09-22 SURGERY — LAPAROSCOPIC CHOLECYSTECTOMY WITH INTRAOPERATIVE CHOLANGIOGRAM
Anesthesia: General | Site: Abdomen

## 2021-09-22 MED ORDER — FENTANYL CITRATE (PF) 250 MCG/5ML IJ SOLN
INTRAMUSCULAR | Status: DC | PRN
  Administered 2021-09-22: 25 ug via INTRAVENOUS
  Administered 2021-09-22: 100 ug via INTRAVENOUS
  Administered 2021-09-22: 50 ug via INTRAVENOUS
  Administered 2021-09-22: 25 ug via INTRAVENOUS
  Administered 2021-09-22: 50 ug via INTRAVENOUS

## 2021-09-22 MED ORDER — DEXAMETHASONE SODIUM PHOSPHATE 10 MG/ML IJ SOLN
INTRAMUSCULAR | Status: DC | PRN
  Administered 2021-09-22: 5 mg via INTRAVENOUS

## 2021-09-22 MED ORDER — OXYCODONE HCL 5 MG PO TABS: 5.0000 mg | ORAL_TABLET | Freq: Once | ORAL | Status: DC | PRN

## 2021-09-22 MED ORDER — HEMOSTATIC AGENTS (NO CHARGE) OPTIME
TOPICAL | Status: DC | PRN
  Administered 2021-09-22: 1 via TOPICAL

## 2021-09-22 MED ORDER — CHLORHEXIDINE GLUCONATE 0.12 % MT SOLN
OROMUCOSAL | Status: AC
  Administered 2021-09-22: 15 mL via OROMUCOSAL
  Filled 2021-09-22: qty 15

## 2021-09-22 MED ORDER — MIDAZOLAM HCL 2 MG/2ML IJ SOLN
INTRAMUSCULAR | Status: AC
  Filled 2021-09-22: qty 2

## 2021-09-22 MED ORDER — CEFAZOLIN SODIUM-DEXTROSE 2-4 GM/100ML-% IV SOLN
INTRAVENOUS | Status: AC
  Filled 2021-09-22: qty 100

## 2021-09-22 MED ORDER — ROCURONIUM BROMIDE 100 MG/10ML IV SOLN
INTRAVENOUS | Status: DC | PRN
  Administered 2021-09-22: 10 mg via INTRAVENOUS
  Administered 2021-09-22: 70 mg via INTRAVENOUS

## 2021-09-22 MED ORDER — BUPIVACAINE-EPINEPHRINE 0.25% -1:200000 IJ SOLN
INTRAMUSCULAR | Status: DC | PRN
  Administered 2021-09-22: 6 mL

## 2021-09-22 MED ORDER — PROPOFOL 10 MG/ML IV BOLUS
INTRAVENOUS | Status: AC
  Filled 2021-09-22: qty 20

## 2021-09-22 MED ORDER — PHENYLEPHRINE 40 MCG/ML (10ML) SYRINGE FOR IV PUSH (FOR BLOOD PRESSURE SUPPORT)
PREFILLED_SYRINGE | INTRAVENOUS | Status: DC | PRN
  Administered 2021-09-22 (×3): 80 ug via INTRAVENOUS

## 2021-09-22 MED ORDER — CHLORHEXIDINE GLUCONATE 0.12 % MT SOLN: 15.0000 mL | Freq: Once | OROMUCOSAL | Status: AC

## 2021-09-22 MED ORDER — AMISULPRIDE (ANTIEMETIC) 5 MG/2ML IV SOLN: 10.0000 mg | Freq: Once | INTRAVENOUS | Status: DC | PRN

## 2021-09-22 MED ORDER — ONDANSETRON HCL 4 MG/2ML IJ SOLN: 4.0000 mg | Freq: Once | INTRAMUSCULAR | Status: DC | PRN

## 2021-09-22 MED ORDER — FENTANYL CITRATE (PF) 100 MCG/2ML IJ SOLN
INTRAMUSCULAR | Status: AC
  Filled 2021-09-22: qty 2

## 2021-09-22 MED ORDER — OXYCODONE HCL 5 MG PO TABS: 5.0000 mg | ORAL_TABLET | Freq: Four times a day (QID) | ORAL | 0 refills | Status: DC | PRN

## 2021-09-22 MED ORDER — SODIUM CHLORIDE 0.9 % IR SOLN
Status: DC | PRN
  Administered 2021-09-22 (×2): 1000 mL

## 2021-09-22 MED ORDER — PROPOFOL 10 MG/ML IV BOLUS
INTRAVENOUS | Status: DC | PRN
  Administered 2021-09-22: 100 mg via INTRAVENOUS

## 2021-09-22 MED ORDER — LIDOCAINE HCL (CARDIAC) PF 100 MG/5ML IV SOSY
PREFILLED_SYRINGE | INTRAVENOUS | Status: DC | PRN
  Administered 2021-09-22: 60 mg via INTRATRACHEAL

## 2021-09-22 MED ORDER — BUPIVACAINE-EPINEPHRINE (PF) 0.25% -1:200000 IJ SOLN
INTRAMUSCULAR | Status: AC
  Filled 2021-09-22: qty 30

## 2021-09-22 MED ORDER — FENTANYL CITRATE (PF) 250 MCG/5ML IJ SOLN
INTRAMUSCULAR | Status: AC
  Filled 2021-09-22: qty 5

## 2021-09-22 MED ORDER — CHLORHEXIDINE GLUCONATE CLOTH 2 % EX PADS: 6.0000 | MEDICATED_PAD | Freq: Once | CUTANEOUS | Status: DC

## 2021-09-22 MED ORDER — CEFAZOLIN SODIUM-DEXTROSE 2-4 GM/100ML-% IV SOLN
2.0000 g | INTRAVENOUS | Status: AC
  Administered 2021-09-22: 2 g via INTRAVENOUS

## 2021-09-22 MED ORDER — OXYCODONE HCL 5 MG/5ML PO SOLN: 5.0000 mg | Freq: Once | ORAL | Status: DC | PRN

## 2021-09-22 MED ORDER — 0.9 % SODIUM CHLORIDE (POUR BTL) OPTIME
TOPICAL | Status: DC | PRN
  Administered 2021-09-22: 1000 mL

## 2021-09-22 MED ORDER — LACTATED RINGERS IV SOLN: INTRAVENOUS | Status: DC

## 2021-09-22 MED ORDER — PHENYLEPHRINE 40 MCG/ML (10ML) SYRINGE FOR IV PUSH (FOR BLOOD PRESSURE SUPPORT)
PREFILLED_SYRINGE | INTRAVENOUS | Status: AC
  Filled 2021-09-22: qty 30

## 2021-09-22 MED ORDER — FENTANYL CITRATE (PF) 100 MCG/2ML IJ SOLN
25.0000 ug | INTRAMUSCULAR | Status: DC | PRN
  Administered 2021-09-22 (×2): 25 ug via INTRAVENOUS

## 2021-09-22 MED ORDER — SODIUM CHLORIDE 0.9 % IV SOLN
INTRAVENOUS | Status: DC | PRN
  Administered 2021-09-22: 11 mL

## 2021-09-22 MED ORDER — ORAL CARE MOUTH RINSE: 15.0000 mL | Freq: Once | OROMUCOSAL | Status: AC

## 2021-09-22 MED ORDER — INDOCYANINE GREEN 25 MG IV SOLR: 7.5000 mg | Freq: Once | INTRAVENOUS | Status: DC

## 2021-09-22 SURGICAL SUPPLY — 50 items
ADH SKN CLS APL DERMABOND .7 (GAUZE/BANDAGES/DRESSINGS) ×1
APL PRP STRL LF DISP 70% ISPRP (MISCELLANEOUS) ×1
APL SRG 38 LTWT LNG FL B (MISCELLANEOUS) ×1
APPLICATOR ARISTA FLEXITIP XL (MISCELLANEOUS) ×1 IMPLANT
APPLIER CLIP ROT 10 11.4 M/L (STAPLE) ×2
APR CLP MED LRG 11.4X10 (STAPLE) ×1
BAG COUNTER SPONGE SURGICOUNT (BAG) ×2 IMPLANT
BAG SPEC RTRVL 10 TROC 200 (ENDOMECHANICALS) ×1
BAG SPNG CNTER NS LX DISP (BAG) ×1
BLADE CLIPPER SURG (BLADE) IMPLANT
CANISTER SUCT 3000ML PPV (MISCELLANEOUS) ×2 IMPLANT
CHLORAPREP W/TINT 26 (MISCELLANEOUS) ×2 IMPLANT
CLIP APPLIE ROT 10 11.4 M/L (STAPLE) ×1 IMPLANT
COVER MAYO STAND STRL (DRAPES) ×3 IMPLANT
COVER SURGICAL LIGHT HANDLE (MISCELLANEOUS) ×2 IMPLANT
DERMABOND ADVANCED (GAUZE/BANDAGES/DRESSINGS) ×1
DERMABOND ADVANCED .7 DNX12 (GAUZE/BANDAGES/DRESSINGS) ×1 IMPLANT
DRAPE C-ARM 42X120 X-RAY (DRAPES) ×2 IMPLANT
ELECT REM PT RETURN 9FT ADLT (ELECTROSURGICAL) ×2
ELECTRODE REM PT RTRN 9FT ADLT (ELECTROSURGICAL) ×1 IMPLANT
GLOVE SRG 8 PF TXTR STRL LF DI (GLOVE) ×1 IMPLANT
GLOVE SURG ENC MOIS LTX SZ8 (GLOVE) ×2 IMPLANT
GLOVE SURG UNDER POLY LF SZ8 (GLOVE) ×2
GOWN STRL REUS W/ TWL LRG LVL3 (GOWN DISPOSABLE) ×2 IMPLANT
GOWN STRL REUS W/ TWL XL LVL3 (GOWN DISPOSABLE) ×1 IMPLANT
GOWN STRL REUS W/TWL LRG LVL3 (GOWN DISPOSABLE) ×6
GOWN STRL REUS W/TWL XL LVL3 (GOWN DISPOSABLE) ×2
HEMOSTAT ARISTA ABSORB 3G PWDR (HEMOSTASIS) ×1 IMPLANT
KIT BASIN OR (CUSTOM PROCEDURE TRAY) ×2 IMPLANT
KIT TURNOVER KIT B (KITS) ×2 IMPLANT
NS IRRIG 1000ML POUR BTL (IV SOLUTION) ×2 IMPLANT
PAD ARMBOARD 7.5X6 YLW CONV (MISCELLANEOUS) ×2 IMPLANT
POUCH RETRIEVAL ECOSAC 10 (ENDOMECHANICALS) ×1 IMPLANT
POUCH RETRIEVAL ECOSAC 10MM (ENDOMECHANICALS) ×2
SCISSORS LAP 5X35 DISP (ENDOMECHANICALS) ×2 IMPLANT
SET CHOLANGIOGRAPH 5 50 .035 (SET/KITS/TRAYS/PACK) ×3 IMPLANT
SET IRRIG TUBING LAPAROSCOPIC (IRRIGATION / IRRIGATOR) ×2 IMPLANT
SET TUBE SMOKE EVAC HIGH FLOW (TUBING) ×2 IMPLANT
SLEEVE ENDOPATH XCEL 5M (ENDOMECHANICALS) ×2 IMPLANT
SPECIMEN JAR SMALL (MISCELLANEOUS) ×2 IMPLANT
SUT MNCRL AB 4-0 PS2 18 (SUTURE) ×2 IMPLANT
SUT VICRYL 0 UR6 27IN ABS (SUTURE) ×1 IMPLANT
TOWEL GREEN STERILE (TOWEL DISPOSABLE) ×2 IMPLANT
TOWEL GREEN STERILE FF (TOWEL DISPOSABLE) ×2 IMPLANT
TRAY LAPAROSCOPIC MC (CUSTOM PROCEDURE TRAY) ×2 IMPLANT
TROCAR XCEL BLUNT TIP 100MML (ENDOMECHANICALS) ×2 IMPLANT
TROCAR XCEL NON-BLD 11X100MML (ENDOMECHANICALS) ×2 IMPLANT
TROCAR XCEL NON-BLD 5MMX100MML (ENDOMECHANICALS) ×2 IMPLANT
WARMER LAPAROSCOPE (MISCELLANEOUS) ×2 IMPLANT
WATER STERILE IRR 1000ML POUR (IV SOLUTION) ×2 IMPLANT

## 2021-09-22 NOTE — Discharge Instructions (Signed)
[facility] ______[facility] LAPAROSCOPIC SURGERY: POST OP INSTRUCTIONS Always review your discharge instruction sheet given to you by the facility where your surgery was performed. IF YOU HAVE DISABILITY OR FAMILY LEAVE FORMS, YOU MUST BRING THEM TO THE OFFICE FOR PROCESSING.   DO NOT GIVE THEM TO YOUR DOCTOR.  A prescription for pain medication may be given to you upon discharge.  Take your pain medication as prescribed, if needed.  If narcotic pain medicine is not needed, then you may take acetaminophen (Tylenol) or ibuprofen (Advil) as needed. Take your usually prescribed medications unless otherwise directed. If you need a refill on your pain medication, please contact your pharmacy.  They will contact our office to request authorization. Prescriptions will not be filled after 5pm or on week-ends. You should follow a light diet the first few days after arrival home, such as soup and crackers, etc.  Be sure to include lots of fluids daily. Most patients will experience some swelling and bruising in the area of the incisions.  Ice packs will help.  Swelling and bruising can take several days to resolve.  It is common to experience some constipation if taking pain medication after surgery.  Increasing fluid intake and taking a stool softener (such as Colace) will usually help or prevent this problem from occurring.  A mild laxative (Milk of Magnesia or Miralax) should be taken according to package instructions if there are no bowel movements after 48 hours. Unless discharge instructions indicate otherwise, you may remove your bandages 24-48 hours after surgery, and you may shower at that time.  You may have steri-strips (small skin tapes) in place directly over the incision.  These strips should be left on the skin for 7-10 days.  If your surgeon used skin glue on the incision, you may shower in 24 hours.  The glue will flake off over the next 2-3 weeks.  Any sutures or staples will be  removed at the office during your follow-up visit. ACTIVITIES:  You may resume regular (light) daily activities beginning the next day--such as daily self-care, walking, climbing stairs--gradually increasing activities as tolerated.  You may have sexual intercourse when it is comfortable.  Refrain from any heavy lifting or straining until approved by your doctor. You may drive when you are no longer taking prescription pain medication, you can comfortably wear a seatbelt, and you can safely maneuver your car and apply brakes. RETURN TO WORK:  __________________________________________________________ You should see your doctor in the office for a follow-up appointment approximately 2-3 weeks after your surgery.  Make sure that you call for this appointment within a day or two after you arrive home to insure a convenient appointment time. OTHER INSTRUCTIONS: __________________________________________________________________________________________________________________________ __________________________________________________________________________________________________________________________ WHEN TO CALL YOUR DOCTOR: Fever over 101.0 Inability to urinate Continued bleeding from incision. Increased pain, redness, or drainage from the incision. Increasing abdominal pain  The clinic staff is available to answer your questions during regular business hours.  Please don't hesitate to call and ask to speak to one of the nurses for clinical concerns.  If you have a medical emergency, go to the nearest emergency room or call 911.  A surgeon from [facility] is always on call at the hospital. [address], [address], [place], [place]  [address] ? [address] [address], [place], [place]   [address] [phone] ? [phone] ? FAX [phone] Web site: [email or web address]  

## 2021-09-22 NOTE — Op Note (Signed)
Laparoscopic Cholecystectomy with IOC Procedure Note  Indications: This patient presents with symptomatic gallbladder disease and will undergo laparoscopic cholecystectomy.The procedure has been discussed with the patient. Operative and non operative treatments have been discussed. Risks of surgery include bleeding, infection,  Common bile duct injury,  Injury to the stomach,liver, colon,small intestine, abdominal wall,  Diaphragm,  Major blood vessels,  And the need for an open procedure.  Other risks include worsening of medical problems, death,  DVT and pulmonary embolism, and cardiovascular events.   Medical options have also been discussed. The patient has been informed of long term expectations of surgery and non surgical options,  The patient agrees to proceed.     Pre-operative Diagnosis: Chronic cholecystitis  Post-operative Diagnosis: Same  Surgeon: Turner Daniels  MD   Assistants: Pryor Curia RNFA   Anesthesia: General endotracheal anesthesia and Local anesthesia 0.25.% bupivacaine  ASA Class: 2  Procedure Details  The patient was seen again in the Holding Room. The risks, benefits, complications, treatment options, and expected outcomes were discussed with the patient. The possibilities of reaction to medication, pulmonary aspiration, perforation of viscus, bleeding, recurrent infection, finding a normal gallbladder, the need for additional procedures, failure to diagnose a condition, the possible need to convert to an open procedure, and creating a complication requiring transfusion or operation were discussed with the patient. The patient and/or family concurred with the proposed plan, giving informed consent. The site of surgery properly noted/marked. The patient was taken to Operating Room, identified as Kristy [name] and the procedure verified as Laparoscopic Cholecystectomy with Intraoperative Cholangiograms. A Time Out was held and the above information  confirmed.  Prior to the induction of general anesthesia, antibiotic prophylaxis was administered. General endotracheal anesthesia was then administered and tolerated well. After the induction, the abdomen was prepped in the usual sterile fashion. The patient was positioned in the supine position with the left arm comfortably tucked, along with some reverse Trendelenburg.  Local anesthetic agent was injected into the skin near the umbilicus and an incision made. The midline fascia was incised and the Hasson technique was used to introduce a 12 mm port under direct vision. It was secured with a figure of eight Vicryl suture placed in the usual fashion. Pneumoperitoneum was then created with CO2 and tolerated well without any adverse changes in the patient's vital signs. Additional trocars were introduced under direct vision with an 11 mm trocar in the epigastrium and two  5 mm trocars in the right upper quadrant. All skin incisions were infiltrated with a local anesthetic agent before making the incision and placing the trocars.   The gallbladder was identified, the fundus grasped and retracted cephalad. Adhesions were lysed bluntly and with the electrocautery where indicated, taking care not to injure any adjacent organs or viscus. The infundibulum was grasped and retracted laterally, exposing the peritoneum overlying the triangle of Calot. This was then divided and exposed in a blunt fashion. The cystic duct was clearly identified and bluntly dissected circumferentially. The junctions of the gallbladder, cystic duct and common bile duct were clearly identified prior to the division of any linear structure.   An incision was made in the cystic duct and the cholangiogram catheter introduced. The catheter was secured using an endoclip. The study showed no stones and good visualization of the distal and proximal biliary tree. The catheter was then removed.   The cystic duct was then  ligated with surgical  clips  on the patient side and  clipped  on the gallbladder side and divided. The cystic artery was identified, dissected free, ligated with clips and divided as well. Posterior cystic artery clipped and divided.  The gallbladder was dissected from the liver bed in retrograde fashion with the electrocautery. The gallbladder was removed. The liver bed was irrigated and inspected. Hemostasis was achieved with the electrocautery. Copious irrigation was utilized and was repeatedly aspirated until clear all particulate matter. Hemostasis was achieved  with  cautery and Arista with no signs of bleeding or bile leakage.  Pneumoperitoneum was completely reduced after viewing removal of the trocars under direct vision. The wound was thoroughly irrigated and the fascia was then closed with a figure of eight suture; the skin was then closed with 4 0 MONOCRYL  and a sterile dressing was applied.  Instrument, sponge, and needle counts were correct at closure and at the conclusion of the case.   Findings: Cholecystitis with Cholelithiasis  Estimated Blood Loss: less than 50 mL         Drains: none         Total IV Fluids: per anesthesia record          Specimens: Gallbladder           Complications: None; patient tolerated the procedure well.         Disposition: PACU - hemodynamically stable.         Condition: stable

## 2021-09-22 NOTE — Transfer of Care (Signed)
Immediate Anesthesia Transfer of Care Note  Patient: Kristy [name]  Procedure(s) Performed: LAPAROSCOPIC CHOLECYSTECTOMY WITH  INTRAOPERATIVE CHOLANGIOGRAM (Abdomen)  Patient Location: PACU  Anesthesia Type:General  Level of Consciousness: drowsy and patient cooperative  Airway & Oxygen Therapy: Patient Spontanous Breathing and Patient connected to nasal cannula oxygen  Post-op Assessment: Report given to RN and Post -op Vital signs reviewed and stable  Post vital signs: Reviewed and stable  Last Vitals:  Vitals Value Taken Time  BP 128/51 09/22/21 0942  Temp 36.6 C 09/22/21 0945  Pulse 66 09/22/21 0944  Resp 14 09/22/21 0944  SpO2 99 % 09/22/21 0944  Vitals shown include unvalidated device data.  Last Pain:  Vitals:   09/22/21 0558  TempSrc: Oral  PainSc:          Complications: No notable events documented.

## 2021-09-22 NOTE — Anesthesia Procedure Notes (Signed)
Procedure Name: Intubation Date/Time: 09/22/2021 7:44 AM Performed by: Leonor Liv, CRNA Pre-anesthesia Checklist: Patient identified, Emergency Drugs available, Suction available and Patient being monitored Patient Re-evaluated:Patient Re-evaluated prior to induction Oxygen Delivery Method: Circle System Utilized Preoxygenation: Pre-oxygenation with 100% oxygen Induction Type: IV induction Ventilation: Mask ventilation without difficulty Laryngoscope Size: Mac and 3 Grade View: Grade I Tube type: Oral Tube size: 7.5 mm Number of attempts: 1 Airway Equipment and Method: Stylet and Oral airway Placement Confirmation: ETT inserted through vocal cords under direct vision, positive ETCO2 and breath sounds checked- equal and bilateral Secured at: 21 cm Tube secured with: Tape Dental Injury: Teeth and Oropharynx as per pre-operative assessment

## 2021-09-22 NOTE — Interval H&P Note (Signed)
History and Physical Interval Note:  09/22/2021 7:14 AM  Kristy [name]  has presented today for surgery, with the diagnosis of GALLSTONES.  The various methods of treatment have been discussed with the patient and family. After consideration of risks, benefits and other options for treatment, the patient has consented to  Procedure(s): LAPAROSCOPIC CHOLECYSTECTOMY WITH POSSIBLE INTRAOPERATIVE CHOLANGIOGRAM (N/A) as a surgical intervention.  The patient's history has been reviewed, patient examined, no change in status, stable for surgery.  I have reviewed the patient's chart and labs.  Questions were answered to the patient's satisfaction.     [place]

## 2021-09-22 NOTE — H&P (Signed)
Chief Complaint: Follow-up   History of Present Illness: Kristy [name] is a 80 y.o. female who is seen today for follow-up of gallstone disease as well as right breast DCIS. She has finished treatment which included surgery of right breast lumpectomy followed by radiation therapy. She also has a gallstones and symptoms from that. That is what initially brought her to see me but mammogram showed changes which prompted the lumpectomy. She is ready to schedule her surgery and is done with her breast treatments. She does have flareups of abdominal pain and come go but they are unpredictable and she is ready to proceed with cholecystectomy.  Review of Systems: A complete review of systems was obtained from the patient. I have reviewed this information and discussed as appropriate with the patient. See HPI as well for other ROS.    Medical History: Past Medical History:  Diagnosis Date   Dyslipidemia   Fibromuscular dysplasia (CMS-HCC)   GERD (gastroesophageal reflux disease)   Hallux rigidus   Hallux valgus (acquired)   HTN (hypertension)   Osteopenia   Patient Active Problem List  Diagnosis   Lumbar radiculitis   Lumbar spondylosis   Left hip pain   DDD (degenerative disc disease), lumbar   Past Surgical History:  Procedure Laterality Date   Hysterectomy - unknown type   knee surgery    Allergies  Allergen Reactions   Doxycycline Other (See Comments)   Current Outpatient Medications on File Prior to Visit  Medication Sig Dispense Refill   losartan (COZAAR) 50 MG tablet Take 50 mg by mouth once daily   aspirin 81 MG EC tablet Take 81 mg by mouth once daily.   chlorthalidone 25 MG tablet Take 25 mg by mouth once daily.   gabapentin (NEURONTIN) 300 MG capsule Take 1 capsule (300 mg total) by mouth nightly. 1 po qHS x 4 days, then bid x 4 days, then tid 90 capsule 2   meloxicam (MOBIC) 7.5 MG tablet Take 7.5 mg by mouth once daily.   metoprolol succinate (TOPROL-XL) 100 MG  XL tablet Take 100 mg by mouth once daily.   multivitamin tablet Take 1 tablet by mouth once daily.   No current facility-administered medications on file prior to visit.   Family History  Problem Relation Age of Onset   ALS Mother   Lung cancer Father    Social History   Tobacco Use  Smoking Status Former Smoker   Quit date: 05/30/1974   Years since quitting: 47.2  Smokeless Tobacco Never Used    Social History   Socioeconomic History   Marital status: Married  Occupational History   Occupation: retired  Tobacco Use   Smoking status: Former Smoker  Quit date: 05/30/1974  Years since quitting: 47.2   Smokeless tobacco: Never Used  Substance and Sexual Activity   Alcohol use: No  Alcohol/week: 0.0 standard drinks   Objective:   Vitals:  08/23/21 1121  BP: 126/80  Pulse: 79  Temp: 36.5 C (97.7 F)  SpO2: 97%  Weight: 78.5 kg (173 lb)  Height: 157.5 cm (5\' 2" )   Body mass index is 31.64 kg/m.  Physical Exam Constitutional:  Appearance: Normal appearance.  HENT:  Nose: Nose normal.  Eyes:  Pupils: Pupils are equal, round, and reactive to light.  Cardiovascular:  Rate and Rhythm: Normal rate and regular rhythm.  Pulmonary:  Effort: Pulmonary effort is normal.  Breath sounds: No stridor.  Chest:   Abdominal:  Palpations: Abdomen is soft. There is no  mass.  Tenderness: There is no abdominal tenderness. There is no guarding.  Hernia: No hernia is present.  Musculoskeletal:  Cervical back: Normal range of motion.  Neurological:  Mental Status: She is alert.     Labs, Imaging and Diagnostic Testing:  none  Assessment and Plan:  Diagnoses and all orders for this visit:  Calculus of gallbladder with chronic cholecystitis without obstruction  Malignant neoplasm of upper-outer quadrant of right breast in female, estrogen receptor positive (CMS-HCC)    Patient is ready to proceed with cholecystectomy. Risk of bleeding, infection, common bile duct  injury, bowel injury, bladder injury, bile leak, death, DVT, injury to adjacent organs, open surgery, and need for drainage tubes and/or the procedures discussed. She agrees to proceed  She has finished treatment for her right breast DCIS and is stable. She will be followed by medical oncology going forward.  No follow-ups on file.  Kennieth Francois, MD   I had direct face-to-face contact with the patient for a total of 30 minutes and greater than 50% of that time was spent providing counseling and/or coordination of care for the patient regardingsurgery.

## 2021-09-22 NOTE — Anesthesia Postprocedure Evaluation (Signed)
Anesthesia Post Note  Patient: Kristy [name]  Procedure(s) Performed: LAPAROSCOPIC CHOLECYSTECTOMY WITH  INTRAOPERATIVE CHOLANGIOGRAM (Abdomen)     Patient location during evaluation: PACU Anesthesia Type: General Level of consciousness: awake and alert Pain management: pain level controlled Vital Signs Assessment: post-procedure vital signs reviewed and stable Respiratory status: spontaneous breathing, nonlabored ventilation and respiratory function stable Cardiovascular status: blood pressure returned to baseline and stable Postop Assessment: no apparent nausea or vomiting Anesthetic complications: no   No notable events documented.  Last Vitals:  Vitals:   09/22/21 1030 09/22/21 1045  BP: 132/63 113/73  Pulse: 65 67  Resp: 12 16  Temp:  36.7 C  SpO2: 97% 100%    Last Pain:  Vitals:   09/22/21 1015  TempSrc:   PainSc: 9                  Matthieu Loftus E Stefanee Mckell

## 2021-09-23 ENCOUNTER — Encounter (HOSPITAL_COMMUNITY): Payer: Self-pay | Admitting: Surgery

## 2021-09-23 LAB — SURGICAL PATHOLOGY

## 2021-10-04 DIAGNOSIS — H0288A Meibomian gland dysfunction right eye, upper and lower eyelids: Secondary | ICD-10-CM | POA: Diagnosis not present

## 2021-10-04 DIAGNOSIS — H04123 Dry eye syndrome of bilateral lacrimal glands: Secondary | ICD-10-CM | POA: Diagnosis not present

## 2021-10-04 DIAGNOSIS — H25013 Cortical age-related cataract, bilateral: Secondary | ICD-10-CM | POA: Diagnosis not present

## 2021-10-04 DIAGNOSIS — H524 Presbyopia: Secondary | ICD-10-CM | POA: Diagnosis not present

## 2021-10-04 DIAGNOSIS — H2513 Age-related nuclear cataract, bilateral: Secondary | ICD-10-CM | POA: Diagnosis not present

## 2021-10-04 DIAGNOSIS — H0288B Meibomian gland dysfunction left eye, upper and lower eyelids: Secondary | ICD-10-CM | POA: Diagnosis not present

## 2021-10-04 DIAGNOSIS — H52213 Irregular astigmatism, bilateral: Secondary | ICD-10-CM | POA: Diagnosis not present

## 2021-10-07 DIAGNOSIS — H524 Presbyopia: Secondary | ICD-10-CM | POA: Diagnosis not present

## 2021-10-07 DIAGNOSIS — H25013 Cortical age-related cataract, bilateral: Secondary | ICD-10-CM | POA: Diagnosis not present

## 2021-10-07 DIAGNOSIS — H0288B Meibomian gland dysfunction left eye, upper and lower eyelids: Secondary | ICD-10-CM | POA: Diagnosis not present

## 2021-10-07 DIAGNOSIS — H0288A Meibomian gland dysfunction right eye, upper and lower eyelids: Secondary | ICD-10-CM | POA: Diagnosis not present

## 2021-10-07 DIAGNOSIS — H2513 Age-related nuclear cataract, bilateral: Secondary | ICD-10-CM | POA: Diagnosis not present

## 2021-10-07 DIAGNOSIS — H52213 Irregular astigmatism, bilateral: Secondary | ICD-10-CM | POA: Diagnosis not present

## 2021-10-07 DIAGNOSIS — H04123 Dry eye syndrome of bilateral lacrimal glands: Secondary | ICD-10-CM | POA: Diagnosis not present

## 2021-10-12 ENCOUNTER — Other Ambulatory Visit: Payer: Self-pay

## 2021-10-12 ENCOUNTER — Ambulatory Visit
Admission: RE | Admit: 2021-10-12 | Discharge: 2021-10-12 | Disposition: A | Discharge status: Home / Self Care | Payer: Medicare Other | Source: Ambulatory Visit | Attending: Oncology | Admitting: Oncology

## 2021-10-12 DIAGNOSIS — R922 Inconclusive mammogram: Secondary | ICD-10-CM | POA: Diagnosis not present

## 2021-10-12 DIAGNOSIS — D0511 Intraductal carcinoma in situ of right breast: Secondary | ICD-10-CM

## 2021-10-12 HISTORY — DX: Personal history of irradiation: Z92.3

## 2021-10-30 NOTE — Progress Notes (Signed)
Kristy [name]  [phone] [phone]    ID: Kristy [name] DOB: 10/17/1941  MR#: [id number]  JSE#:[id number]  Patient Care Team: Jinny Sanders, MD as PCP - General [name], MD as Consulting Physician (General Surgery) Kenzey Birkland, Virgie Dad, MD as Consulting Physician (Oncology) Kyung Rudd, MD as Consulting Physician (Radiation Oncology) Ladene Artist, MD as Consulting Physician (Gastroenterology) Mauro Kaufmann, RN as Oncology Nurse Navigator Rockwell Germany, RN as Oncology Nurse Navigator Debbora Dus, [facility] as Pharmacist (Pharmacist) Chauncey Cruel, MD OTHER MD:   CHIEF COMPLAINT: noninvasive breast cancer, estrogen receptor positive  CURRENT TREATMENT: Tamoxifen   INTERVAL HISTORY: [name] returns today for follow up of her noninvasive breast cancer.   She started tamoxifen on 03/22/2021.  She has some hot flashes which do not wake her up at night.  She has a little bit of vaginal wetness, not enough to do anything about.  She does not feel she needs any intervention for any of these problems.  Overall she is tolerating tamoxifen fine.  Since her last visit, she underwent bilateral diagnostic mammography with tomography at [facility] on 10/12/2021 showing: breast density category C; no evidence of malignancy in either breast.   Of note, she also underwent cholecystectomy on 09/22/2021 under Dr. Brantley Stage. Pathology from the procedure ([id number]) showed: chronic cholecystitis with cholelithiasis.   REVIEW OF SYSTEMS: Dashayla walks about 03-5999 steps per day.  Since her cholecystectomy she has had a problem with foods and she is keeping a food diary to figure out which early once that make her feel like she might throw up.  She has not figured it out yet.  She also has not lost any weight.  A detailed review of systems today was otherwise stable.   COVID 19 VACCINATION STATUS: fully vaccinated [employer]), with booster  08/2020   HISTORY OF CURRENT ILLNESS: From the original intake note:  Kristy [name] has a history of right breast lumpectomy on 08/14/2017 under Dr. Brantley Stage for a tubular adenoma with calcifications.  More recently, she had routine screening mammography on 10/11/2020 showing a possible abnormality in the right breast. She underwent right diagnostic mammography with tomography at [facility] on 10/29/2020 showing: breast density category C; indeterminate 1.1 cm group of fine pleomorphic calcifications in lower-outer right breast.  Accordingly on 11/09/2020 she proceeded to biopsy of the right breast area in question. The pathology from this procedure ([id number]) showed: ductal carcinoma in situ, high grade, with necrosis and calcifications; complex sclerosing lesion. Prognostic indicators significant for: estrogen receptor, 95% positive with strong staining intensity and progesterone receptor, 0% negative.   She underwent breast MRI on 11/15/2020 showing: breast composition C; enhancement around a small hematoma and biopsy tract in lower-outer right breast consistent with patient's known malignancy; indeterminate 6 mm enhancing mass with suspicious kinetics, just superior and posterior to biopsy site in lower-outer right breast; indeterminate 1.8 cm area of linear non-mass enhancement in superior central right breast; no evidence of malignancy in left breast or bilateral lymph nodes.  She proceeded to additional lower-outer right breast biopsy on 11/23/2020. Pathology [id number]) again showed ductal carcinoma in situ, high grade, with necrosis and calcifications. Prognostic panel showing: estrogen receptor 100% positive with strong staining intensity; progesterone receptor 0% negative.  Two additional right breast biopsies were obtained on 11/29/2020. Pathology [id number]) showed: 1. Right Breast, upper central  - ductal carcinoma in situ with necrosis and calcifications 2. 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 Brantley Stage. Pathology from the procedure ([id number]) showed: multifocal intermediate to high grade ductal carcinoma in situ; carcinoma present at cauterized inferior margin; complex sclerosing lesion.   Cancer Staging  Ductal carcinoma in situ (DCIS) of right breast Staging form: Breast, AJCC 7th Edition - Pathologic: Stage 0 (Tis (DCIS), N0, cM0) - Signed by Chauncey Cruel, MD on 12/27/2020 Histologic grade (G): G3 Estrogen receptor status: Positive Progesterone receptor status: Negative  The patient's subsequent history is as detailed below.   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   Constipation    uses stool softener PRN- uses once a week to once every 2 weeks    Dyspnea    Fibromuscular dysplasia ([facility])    Gallbladder disease    GERD (gastroesophageal reflux disease)    Hyperlipidemia    Hypertension    For 16 years; urinary catecholamines within normal limits 12/09; renal Doppler ultrasound showed no evidence for renal artery stenosis   Normal echocardiogram 02/2009   LVEF 65%; no regional wall motion abnormalities; normal RV size and function; pulmonic valve had increased gradient across w/ peak gradient of about 36 mmHg (range of moderate pulmonic stenosis) followup showed normal valve   Papilloma of breast    right   Personal history of radiation therapy     Tinnitus of left ear     PAST SURGICAL HISTORY: Past Surgical History:  Procedure Laterality Date   ANGIOPLASTY     2012   BREAST BIOPSY Right 2006   benign   BREAST BIOPSY Right 11/09/2020   BREAST BIOPSY Right 11/23/2020   BREAST BIOPSY Right 11/29/2020   x2   BREAST EXCISIONAL BIOPSY Right 07/2017   benign   BREAST LUMPECTOMY Right 12/16/2020   BREAST LUMPECTOMY WITH RADIOACTIVE SEED LOCALIZATION Right 08/14/2017   Procedure: RIGHT BREAST LUMPECTOMY WITH RADIOACTIVE SEED LOCALIZATION;  Surgeon: [name], MD;  Location: [facility];  Service: General;  Laterality: Right;   BREAST LUMPECTOMY WITH RADIOACTIVE SEED LOCALIZATION Right 12/16/2020   Procedure: RADIOACTIVE SEED GUIDED TIMES 3 RIGHT BREAST LUMPECTOMY;  Surgeon: [name], MD;  Location: [facility];  Service: General;  Laterality: Right;   Cardiolyte  09/2006   Neg   CHOLECYSTECTOMY N/A 09/22/2021   Procedure: LAPAROSCOPIC CHOLECYSTECTOMY WITH  INTRAOPERATIVE CHOLANGIOGRAM;  Surgeon: [name], MD;  Location: [facility];  Service: General;  Laterality: N/A;   COLONOSCOPY     KNEE CARTILAGE SURGERY     right knee   POLYPECTOMY     RE-EXCISION OF BREAST LUMPECTOMY Right 01/11/2021   Procedure: RE-EXCISION OF RIGHT BREAST LUMPECTOMY;  Surgeon: [name], MD;  Location: [facility] [place] [facility];  Service: General;  Laterality: Right;   TOTAL ABDOMINAL HYSTERECTOMY     no cervix   UPPER GASTROINTESTINAL ENDOSCOPY      FAMILY HISTORY: Family History  Problem Relation Age of  Onset   Prostate cancer Father    Lung cancer Father    Colon cancer Maternal Uncle    Colon cancer Paternal Uncle    Colon polyps Sister    Coronary artery disease Paternal Grandmother    Goiter Other        ?   Esophageal cancer Neg Hx    Stomach cancer Neg Hx    Rectal cancer Neg Hx    Bladder Cancer Neg Hx    Kidney cancer Neg Hx   The patient's father died at the age of 77 from lung  cancer.  The patient's mother died from amyotrophic lateral sclerosis at age 25.  The patient has 1 brother and 1 sister.  There is no other family history of cancer to her knowledge   GYNECOLOGIC HISTORY:  No LMP recorded. Patient has had a hysterectomy. Menarche: 80 years old Gantt P 0 LMP at the time of hysterectomy in 1993, for fibroids contraceptive no HRT yes, about 4 years  Hysterectomy? yes BSO?  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  Her husband died from colon cancer at age 64.  At [facility] she lives with Pixie Casino, her significant other, who is a retired Administrator.   ADVANCED DIRECTIVES: Not in place; the patient is considering naming her niece Bayard Males, a retired Marine scientist, as her healthcare power of [place]: Social History   Tobacco Use   Smoking status: Former    Types: Cigarettes    Quit date: 11/20/1982    Years since quitting: 38.9   Smokeless tobacco: Never   Tobacco comments:    15 pack year history  Vaping Use   Vaping Use: Never used  Substance Use Topics   Alcohol use: No   Drug use: No     Colonoscopy: 04/2019 (Dr. Fuller Plan), recall 2022  PAP: Status post hysterectomy  Bone density: 08/2019, -2.0   Allergies  Allergen Reactions   Vibramycin [Doxycycline]     Many years ago - "made me feel funny"    Current Outpatient Medications  Medication Sig Dispense Refill   atorvastatin (LIPITOR) 20 MG tablet TAKE 1 TABLET BY MOUTH  DAILY 90 tablet 3   Cholecalciferol (D3 2000) 50 MCG (2000 UT) CAPS Take 2,000 Units by mouth daily.     losartan (COZAAR) 50 MG tablet Take 1 tablet (50 mg total) by mouth daily. 90 tablet 3   metoprolol succinate (TOPROL-XL) 100 MG 24 hr tablet TAKE 1 TABLET BY MOUTH  DAILY WITH OR IMMEDIATELY  FOLLOWING A MEAL 90 tablet 3   Naphazoline HCl (CLEAR EYES OP) Place 1 drop  into both eyes every 8 (eight) hours as needed (eye redness).     oxyCODONE (OXY IR/ROXICODONE) 5 MG immediate release tablet Take 1 tablet (5 mg total) by mouth every 6 (six) hours as needed for severe pain. 15 tablet 0   sodium chloride (OCEAN) 0.65 % SOLN nasal spray Place 1 spray into both nostrils every 4 (four) hours as needed for congestion.     tamoxifen (NOLVADEX) 20 MG tablet Take 20 mg by mouth daily.     triamterene-hydrochlorothiazide (MAXZIDE-25) 37.5-25 MG tablet TAKE 1 TABLET BY MOUTH  DAILY 90 tablet 3   valACYclovir (VALTREX) 500 MG tablet Take 1 tablet (500 mg total) by mouth daily. Take daily for suppressive therapy (Patient taking differently: Take 500 mg  by mouth daily as needed (outbreaks).) 30 tablet 11   No current facility-administered medications for this visit.    OBJECTIVE: African-American woman who appears younger than stated age  39:   10/31/21 1008  BP: (!) 148/67  Pulse: 72  Temp: 97.7 F (36.5 C)  SpO2: 100%      Body mass index is 32.1 kg/m.   Wt Readings from Last 3 Encounters:  10/31/21 175 lb 8 oz (79.6 kg)  09/15/21 175 lb 12.8 oz (79.7 kg)  08/09/21 172 lb (78 kg)     ECOG FS:1 - Symptomatic but completely ambulatory  Sclerae unicteric, EOMs intact Wearing a mask No cervical or supraclavicular adenopathy Lungs no rales or rhonchi Heart regular rate and rhythm Abd soft, nontender, positive bowel sounds MSK no focal spinal tenderness, no upper extremity lymphedema Neuro: nonfocal, well oriented, appropriate affect Breasts: The right breast is status postlumpectomy and radiation.  There is still hyperpigmentation.  There is also some skin coarsening and some distortion of the breast contour secondary to treatment but there is no evidence of disease recurrence.  The left breast and both axillae are benign.   LAB RESULTS:  CMP     Component Value Date/Time   NA 139 09/15/2021 1319   K 3.8 09/15/2021 1319   CL 105 09/15/2021 1319    CO2 27 09/15/2021 1319   GLUCOSE 98 09/15/2021 1319   BUN 13 09/15/2021 1319   CREATININE 0.88 09/15/2021 1319   CALCIUM 9.8 09/15/2021 1319   PROT 6.9 09/15/2021 1319   ALBUMIN 3.7 09/15/2021 1319   AST 18 09/15/2021 1319   ALT 15 09/15/2021 1319   ALKPHOS 42 09/15/2021 1319   BILITOT 0.4 09/15/2021 1319   GFRNONAA >60 09/15/2021 1319   GFRAA >60 08/09/2017 1300    No results found for: TOTALPROTELP, ALBUMINELP, A1GS, A2GS, BETS, BETA2SER, GAMS, MSPIKE, SPEI  Lab Results  Component Value Date   WBC 7.3 09/15/2021   NEUTROABS 3.1 09/15/2021   HGB 13.1 09/15/2021   HCT 39.0 09/15/2021   MCV 97.0 09/15/2021   PLT 297 09/15/2021    No results found for: LABCA2  No components found for: DUKGUR427  No results for input(s): INR in the last 168 hours.  No results found for: LABCA2  No results found for: CWC376  No results found for: EGB151  No results found for: VOH607  No results found for: CA2729  No components found for: HGQUANT  No results found for: CEA1 / No results found for: CEA1   No results found for: AFPTUMOR  No results found for: CHROMOGRNA  No results found for: KPAFRELGTCHN, LAMBDASER, KAPLAMBRATIO (kappa/lambda light chains)  No results found for: HGBA, HGBA2QUANT, HGBFQUANT, HGBSQUAN (Hemoglobinopathy evaluation)   No results found for: LDH  No results found for: IRON, TIBC, IRONPCTSAT (Iron and TIBC)  No results found for: FERRITIN  Urinalysis    Component Value Date/Time   COLORURINE STRAW (A) 03/02/2017 1320   APPEARANCEUR Clear 08/05/2019 1555   LABSPEC 1.004 (L) 03/02/2017 1320   PHURINE 5.0 03/02/2017 1320   GLUCOSEU Negative 08/05/2019 1555   HGBUR MODERATE (A) 03/02/2017 1320   HGBUR large 11/03/2010 1548   BILIRUBINUR Negative 09/28/2020 1247   BILIRUBINUR Negative 08/05/2019 1555   KETONESUR NEGATIVE 03/02/2017 1320   PROTEINUR Negative 09/28/2020 1247   PROTEINUR Negative 08/05/2019 1555   PROTEINUR NEGATIVE  03/02/2017 1320   UROBILINOGEN 0.2 09/28/2020 1247   UROBILINOGEN 0.2 06/10/2011 1040   NITRITE Negative 09/28/2020 1247  NITRITE Negative 08/05/2019 1555   NITRITE NEGATIVE 03/02/2017 1320   LEUKOCYTESUR Negative 09/28/2020 1247   LEUKOCYTESUR Negative 08/05/2019 1555    STUDIES: MM DIAG BREAST TOMO BILATERAL  Addendum Date: 10/17/2021   ADDENDUM REPORT: 10/17/2021 08:30 ADDENDUM: Recommendation: Annual diagnostic mammography. Electronically Signed   By: Dorise Bullion III M.D.   On: 10/17/2021 08:30   Result Date: 10/17/2021 CLINICAL DATA:  Right lumpectomy 1 year ago.  Annual mammography. EXAM: DIGITAL DIAGNOSTIC BILATERAL MAMMOGRAM WITH TOMOSYNTHESIS AND CAD TECHNIQUE: Bilateral digital diagnostic mammography and breast tomosynthesis was performed. The images were evaluated with computer-aided detection. COMPARISON:  Previous exam(s). ACR Breast Density Category c: The breast tissue is heterogeneously dense, which may obscure small masses. FINDINGS: The right lumpectomy site appears as expected. No suspicious masses, calcifications, or distortion identified in either breast. IMPRESSION: No mammographic evidence of malignancy. RECOMMENDATION: No diagnostic mammography. I have discussed the findings and recommendations with the patient. If applicable, a reminder letter will be sent to the patient regarding the next appointment. BI-RADS CATEGORY  2: Benign. Electronically Signed: By: Dorise Bullion III M.D. On: 10/12/2021 15:50    ELIGIBLE FOR AVAILABLE RESEARCH PROTOCOL:   ASSESSMENT: 80 y.o. [place] woman status post right lumpectomy 12/16/2020, for a multi centric ductal carcinoma in situ, grade 3, with a positive inferior lateral margin  (a) consider additional surgery for margin clearance  (1) adjuvant radiation 02/16/2021 through 03/15/2021 Site Technique Total Dose (Gy) Dose per Fx (Gy) Completed Fx Beam Energies  Breast, Right: Breast_Rt 3D 42.56/42.56 2.66 16/16 6X  Breast,  Right: Breast_Rt_Bst 3D 8/8 2 4/4 6X   (2) tamoxifen started 03/22/2021  (a) status post hysterectomy  (b) bone density 09/15/2019 shows a T score of -2.0   PLAN: Selene is coming up on a year from definitive surgery for her noninvasive breast cancer with no evidence of disease recurrence.  This is favorable.  She is tolerating tamoxifen well and the plan is to continue that a total of 5 years  We discussed her dietary issues.  I suggested she pay particular attention to fatty foods as those are the ones more likely to be causing her problems.  She will see [place] again in a year.  She knows to call for any other issue that may develop before then  Total encounter time 20 minutes.Sarajane Jews C. Diesha Rostad, MD 10/31/2021 10:25 AM Medical Oncology and Hematology [facility] [place], [place] [address] Tel. [phone]    Fax. [phone]   This document serves as a record of services personally performed by Lurline Del, MD. It was created on his behalf by Wilburn Mylar, a trained medical scribe. The creation of this record is based on the scribe's personal observations and the provider's statements to them.   I, Lurline Del MD, have reviewed the above documentation for accuracy and completeness, and I agree with the above.   *Total Encounter Time as defined by the Centers for Medicare and Medicaid Services includes, in addition to the face-to-face time of a patient visit (documented in the note above) non-face-to-face time: obtaining and reviewing outside history, ordering and reviewing medications, tests or procedures, care coordination (communications with other health care professionals or caregivers) and documentation in the medical record.

## 2021-10-31 ENCOUNTER — Inpatient Hospital Stay: Payer: Medicare Other | Attending: Oncology | Admitting: Oncology

## 2021-10-31 ENCOUNTER — Other Ambulatory Visit: Payer: Self-pay

## 2021-10-31 VITALS — BP 148/67 | HR 72 | Temp 97.7°F | Wt 175.5 lb

## 2021-10-31 DIAGNOSIS — M818 Other osteoporosis without current pathological fracture: Secondary | ICD-10-CM

## 2021-10-31 DIAGNOSIS — D0511 Intraductal carcinoma in situ of right breast: Secondary | ICD-10-CM | POA: Diagnosis not present

## 2021-10-31 DIAGNOSIS — Z170 Estrogen receptor positive status [ER+]: Secondary | ICD-10-CM | POA: Insufficient documentation

## 2021-10-31 DIAGNOSIS — Z79810 Long term (current) use of selective estrogen receptor modulators (SERMs): Secondary | ICD-10-CM | POA: Insufficient documentation

## 2021-11-01 ENCOUNTER — Telehealth: Payer: Self-pay | Admitting: Oncology

## 2021-11-01 NOTE — Telephone Encounter (Signed)
Scheduled appointment per 12/12 los. Patient is aware. 

## 2022-01-02 ENCOUNTER — Ambulatory Visit: Payer: Medicare Other | Admitting: Family Medicine

## 2022-02-01 ENCOUNTER — Telehealth: Payer: Self-pay

## 2022-02-01 NOTE — Progress Notes (Signed)
? ? ?Chronic Care Management ?Pharmacy Assistant  ? ?Name: Kristy [name]  MRN: [id number] DOB: [birth year] ? ?Reason for Encounter: CCM (Hyperlipidemia Disease State) ?  ?Recent office visits:  ?08/09/2021 - Eliezer Lofts, MD - Patient presented for tachycardia. No medication changes.  ? ?Recent consult visits:  ?10/31/2021 - Lurline Del, MD - Oncology - Patient presented for ductal carcinoma in situ of right breast.  ?10/21/2021 - [name], MD - Patient presented for postop check 4 weeks after laparoscopic cholecystectomy.  ?10/07/2021 - [place] - Patient presented for Meibomian gland dysfunction right eye, upper and lower eyelids.  ?10/04/2021 - [place] - Patient presented for nuclear cataract, bilateral.  ?09/22/2021 - [facility] - Patient presented for Cholecystectomy procedure. Start: Oxycodone 5 MG immediate release tablet. ?08/23/2021 - [name], MD - Patient presented for follow-up of gallstone disease as well as right breast DCIS. Patient is scheduled for Cholecystectomy on 09/22/2021. ? ?Hospital visits:  ?None in previous 6 months ? ?Medications: ?Outpatient Encounter Medications as of 02/01/2022  ?Medication Sig  ? atorvastatin (LIPITOR) 20 MG tablet TAKE 1 TABLET BY MOUTH  DAILY  ? Cholecalciferol (D3 2000) 50 MCG (2000 UT) CAPS Take 2,000 Units by mouth daily.  ? losartan (COZAAR) 50 MG tablet Take 1 tablet (50 mg total) by mouth daily.  ? metoprolol succinate (TOPROL-XL) 100 MG 24 hr tablet TAKE 1 TABLET BY MOUTH  DAILY WITH OR IMMEDIATELY  FOLLOWING A MEAL  ? Naphazoline HCl (CLEAR EYES OP) Place 1 drop into both eyes every 8 (eight) hours as needed (eye redness).  ? oxyCODONE (OXY IR/ROXICODONE) 5 MG immediate release tablet Take 1 tablet (5 mg total) by mouth every 6 (six) hours as needed for severe pain.  ? sodium chloride (OCEAN) 0.65 % SOLN nasal spray Place 1 spray into both nostrils every 4 (four) hours as needed for congestion.  ?  tamoxifen (NOLVADEX) 20 MG tablet Take 20 mg by mouth daily.  ? triamterene-hydrochlorothiazide (MAXZIDE-25) 37.5-25 MG tablet TAKE 1 TABLET BY MOUTH  DAILY  ? valACYclovir (VALTREX) 500 MG tablet Take 1 tablet (500 mg total) by mouth daily. Take daily for suppressive therapy (Patient taking differently: Take 500 mg by mouth daily as needed (outbreaks).)  ? ?No facility-administered encounter medications on file as of 02/01/2022.  ? ?02/01/2022 ?Name: Kristy [name] MRN: [id number] DOB: 1940-12-07 ?Kristy [name] is a 81 y.o. year old female who is a primary care patient of Bedsole, Mervyn Gay, MD.  ?Comprehensive medication review performed; Spoke to patient regarding cholesterol. ? ?Contacted patient on 02/02/2022 to discuss hyperlipidemia. ? ?Lipid Panel ?   ?Component Value Date/Time  ? CHOL 169 03/25/2021 1159  ? TRIG 90.0 03/25/2021 1159  ? HDL 51.30 03/25/2021 1159  ? [place] 100 (H) 03/25/2021 1159  ? LDLDIRECT 140.3 03/26/2013 0924  ?  ?10-year ASCVD risk score: The 10-year ASCVD risk score (Arnett DK, et al., 2019) is: 17.3% ?  Values used to calculate the score: ?    Age: 81 years ?    Sex: Female ?    Is Non-Hispanic African American: Yes ?    Diabetic: No ?    Tobacco smoker: No ?    Systolic Blood Pressure: 779 mmHg ?    Is BP treated: Yes ?    HDL Cholesterol: 51.3 mg/dL ?    Total Cholesterol: 169 mg/dL ? ?Current antihyperlipidemic regimen:  ?Atorvastatin 20 mg daily ?Previous antihyperlipidemic medications tried: None ? ?ASCVD risk  enhancing conditions: age >79, HTN, and current smoker ? ?What recent interventions/DTPs have been made by any provider to improve Cholesterol control since last CPP Visit: No recent interventions ? ?Any recent hospitalizations or ED visits since last visit with CPP? No ? ?What diet changes have been made to improve Cholesterol?  ?Patient feels she needs to drink more water. Patient is trying to stay away from red meat and fried foods. Uses air fryer more and uses olive  oil.  ? ?What exercise is being done to improve Cholesterol?  ?Patient walks 5,000 - 7,000 steps a day. Patient has a bad right knee with a tear.  ? ?Patient stated during the call she has been having some side effects with her Tamoxifen 20 mg medication. Patient has been feeling very weird and very bloated. Patient also stated she has some liver concerns about the medications she would like to discuss. I scheduled a telephone appointment with Charlene Brooke on 02/09/2022 at 11:45. I asked patient to take her blood pressure daily and keep a log as patient also has hypertension. I advised patient Mendel Ryder would ask for her log. Patient understood and agreed.  ? ?Adherence Review: ?Does the patient have >5 day gap between last estimated fill dates? Yes - Atorvastatin 20 mg  ?I asked the patient about her Atorvastatin 20 mg adherence. She stated she is 100% compliant and receives her medication from [facility]. Patient stated she received a call stating [facility] is ready to ship her new supply.  ? ?Star Rating Drugs ?Medication Name/Dose: Last fill date Days supply ?Atorvastatin 20 mg  06/22/2021 90 - Verified fill date with [facility] ?Losartan 50 mg  01/23/2022 90 - Ver[facility] fill [facility] with [facility]   ? ?Annual wellness visit in last year? No Appointment for AWV on 02/18/2022 ?Most Recent BP reading: 148/67 on 10/31/2021 ? ?Upcoming appointments: ?PCP appointment on 02/18/2022 for AWV ? ?Charlene Brooke, CPP notified ? ?Marijean Niemann, RMA ?Clinical Pharmacy Assistant ?[phone] ? ? ? ?

## 2022-02-06 ENCOUNTER — Telehealth: Payer: Self-pay

## 2022-02-06 NOTE — Progress Notes (Signed)
? ? ?  Chronic Care Management ?Pharmacy Assistant  ? ?Name: Kristy [name]  MRN: [id number] DOB: [birth year] ? ?Reason for Encounter: CCM Counsellor) ? ?Medications: ?Outpatient Encounter Medications as of 02/06/2022  ?Medication Sig  ? atorvastatin (LIPITOR) 20 MG tablet TAKE 1 TABLET BY MOUTH  DAILY  ? Cholecalciferol (D3 2000) 50 MCG (2000 UT) CAPS Take 2,000 Units by mouth daily.  ? losartan (COZAAR) 50 MG tablet Take 1 tablet (50 mg total) by mouth daily.  ? metoprolol succinate (TOPROL-XL) 100 MG 24 hr tablet TAKE 1 TABLET BY MOUTH  DAILY WITH OR IMMEDIATELY  FOLLOWING A MEAL  ? Naphazoline HCl (CLEAR EYES OP) Place 1 drop into both eyes every 8 (eight) hours as needed (eye redness).  ? oxyCODONE (OXY IR/ROXICODONE) 5 MG immediate release tablet Take 1 tablet (5 mg total) by mouth every 6 (six) hours as needed for severe pain.  ? sodium chloride (OCEAN) 0.65 % SOLN nasal spray Place 1 spray into both nostrils every 4 (four) hours as needed for congestion.  ? tamoxifen (NOLVADEX) 20 MG tablet Take 20 mg by mouth daily.  ? triamterene-hydrochlorothiazide (MAXZIDE-25) 37.5-25 MG tablet TAKE 1 TABLET BY MOUTH  DAILY  ? valACYclovir (VALTREX) 500 MG tablet Take 1 tablet (500 mg total) by mouth daily. Take daily for suppressive therapy (Patient taking differently: Take 500 mg by mouth daily as needed (outbreaks).)  ? ?No facility-administered encounter medications on file as of 02/06/2022.  ? ?[name] was contacted to remind of upcoming telephone visit with Charlene Brooke on 02/09/2022 at 11:45. Patient was reminded to have any blood glucose and blood pressure readings available for review at appointment.  ? ?Patient confirmed appointment. ? ?Are you having any problems with your medications? No  ? ?Do you have any concerns you like to discuss with the pharmacist? No ? ?Star Rating Drugs: ?Medication:  Last Fill: Day Supply ?Losartan 50 mg 01/14/2022 90 ?Atorvastatin 20 mg 06/22/2021 90 ?Fill  dates verified with [facility] ? ?Charlene Brooke, CPP notified ? ?Marijean Niemann, RMA ?Clinical Pharmacy Assistant ?[phone] ? ? ? ? ? ? ?

## 2022-02-07 ENCOUNTER — Telehealth: Payer: Self-pay | Admitting: Family Medicine

## 2022-02-07 NOTE — Telephone Encounter (Signed)
Pt called stating she would like an order for blood work because she hasn't been feeling good. ?

## 2022-02-07 NOTE — Telephone Encounter (Signed)
Patient will need an appointment.  

## 2022-02-09 ENCOUNTER — Ambulatory Visit (INDEPENDENT_AMBULATORY_CARE_PROVIDER_SITE_OTHER): Payer: Medicare Other | Admitting: Pharmacist

## 2022-02-09 ENCOUNTER — Other Ambulatory Visit: Payer: Self-pay

## 2022-02-09 VITALS — BP 120/76

## 2022-02-09 DIAGNOSIS — I10 Essential (primary) hypertension: Secondary | ICD-10-CM

## 2022-02-09 DIAGNOSIS — D0511 Intraductal carcinoma in situ of right breast: Secondary | ICD-10-CM

## 2022-02-09 DIAGNOSIS — E782 Mixed hyperlipidemia: Secondary | ICD-10-CM

## 2022-02-09 DIAGNOSIS — M818 Other osteoporosis without current pathological fracture: Secondary | ICD-10-CM

## 2022-02-09 NOTE — Progress Notes (Signed)
? ?Chronic Care Management ?Pharmacy Note ? ?02/09/2022 ?Name:  Kristy [name] MRN:  [id number] DOB:  10-12-1941 ? ?Summary: CCM F/U visit ?-Pt endorses compliance with medications ?-Pt was concerned about tamoxifen side effects - she read about liver and eye issues. Assured patient her liver enzymes have been checked since starting tamoxifen and they were normal (08/2021). She follows regularly with eye doctor. ? ?Recommendations/Changes made from today's visit: ?-No med changes. Advised to contact oncologist with any further tamoxifen issues. ? ?Plan: ?-[facility] will call patient 4 months for general update ?-Pharmacist follow up televisit scheduled for 8 months ?-PCP F/U 02/14/22 ? ? ? ?Subjective: ?Kristy [name] is an 81 y.o. year old female who is a primary patient of Bedsole, Amy E, MD.  The CCM team was consulted for assistance with disease management and care coordination needs.   ? ?Engaged with patient by telephone for follow up visit in response to provider referral for pharmacy case management and/or care coordination services.  ? ?Consent to Services:  ?The patient was given information about Chronic Care Management services, agreed to services, and gave verbal consent prior to initiation of services.  Please see initial visit note for detailed documentation.  ? ?Patient Care Team: ?Jinny Sanders, MD as PCP - General ?[name], MD as Consulting Physician (General Surgery) ?Magrinat, Virgie Dad, MD (Inactive) as Consulting Physician (Oncology) ?Kyung Rudd, MD as Consulting Physician (Radiation Oncology) ?Ladene Artist, MD as Consulting Physician (Gastroenterology) ?Mauro Kaufmann, RN as Oncology Nurse Navigator ?Rockwell Germany, RN as Oncology Nurse Navigator ?Cyndra Feinberg, Cleaster [place], [facility] as Pharmacist (Pharmacist) ? ?Recent office visits: ?08/09/21 Dr Diona Browner OV: 2 wk follow up tachycardia. BMP normal. Swelling improved. ? ?07/26/21 Dr Diona Browner OV: peripheral edema. Stop  nifedipine. Start Losartan 50 mg. ? 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 ? ?08/23/21 Dr Brantley Stage (General Surgery): gallbladder calculus. ? ?Hospital visits: ?09/22/21 Admission for elective cholecystectomy ? ? ?Objective: ? ?Lab Results  ?Component Value Date  ? CREATININE 0.88 09/15/2021  ? BUN 13 09/15/2021  ? GFR 68.62 08/09/2021  ? GFRNONAA >60 09/15/2021  ? GFRAA >60 08/09/2017  ? NA 139 09/15/2021  ? K 3.8 09/15/2021  ? CALCIUM 9.8 09/15/2021  ? CO2 27 09/15/2021  ? GLUCOSE 98 09/15/2021  ? ? ?Lab Results  ?Component Value Date/Time  ? HGBA1C 6.1 03/25/2021 11:59 AM  ? HGBA1C 6.1 11/18/2019 09:35 AM  ? GFR 68.62 08/09/2021 04:30 PM  ? GFR 66.84 03/25/2021 11:59 AM  ?  ?Last diabetic Eye exam: No results found for: HMDIABEYEEXA  ?Last diabetic Foot exam:  ?Lab Results  ?Component Value Date/Time  ? 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  ? ?Lab Results  ?Component Value Date  ? CHOL 169 03/25/2021  ? HDL 51.30 03/25/2021  ? LDLCALC 100 (H) 03/25/2021  ? LDLDIRECT 140.3 03/26/2013  ? TRIG 90.0 03/25/2021  ? CHOLHDL 3 03/25/2021  ? ? ? ?  Latest Ref Rng & Units 09/15/2021  ?  1:19 PM 03/25/2021  ? 11:59 AM 12/13/2020  ? 12:31 PM  ?Hepatic Function  ?Total Protein 6.5 - 8.1 g/dL 6.9   7.6   7.2    ?Albumin 3.5 - 5.0 g/dL 3.7   4.4   3.8    ?AST 15 - 41 U/L _0 ?ALT 0 - 44 U/L _1 ?  Alk Phosphatase 38 - 126 U/L 42   68   68    ?Total Bilirubin 0.3 - 1.2 mg/dL 0.4   0.6   0.9    ? ? ?Lab Results  ?Component Value Date/Time  ? TSH 2.60 05/24/2021 03:34 PM  ? TSH 2.84 07/02/2020 09:33 AM  ? FREET4 0.77 04/17/2019 10:09 AM  ? FREET4 0.64 11/05/2018 11:13 AM  ? ? ? ?  Latest Ref Rng & Units 09/15/2021  ?  1:19 PM 12/13/2020  ? 12:31 PM 09/28/2020  ?  1:28 PM  ?CBC  ?WBC 4.0 - 10.5 K/uL 7.3   8.0   6.7    ?Hemoglobin 12.0 - 15.0 g/dL 13.1   13.6   13.8    ?Hematocrit 36.0 - 46.0 % 39.0   41.1   40.6    ?Platelets 150 - 400 K/uL 297   359   333.0     ? ? ?Lab Results  ?Component Value Date/Time  ? VD25OH 28.04 (L) 11/18/2019 09:35 AM  ? VD25OH 26.95 (L) 09/04/2017 11:35 AM  ? ? ?Clinical ASCVD: No  ?The 10-year ASCVD risk score (Arnett DK, et al., 2019) is: 13.6% ?  Values used to calculate the score: ?    Age: 81 years ?    Sex: Female ?    Is Non-Hispanic African American: Yes ?    Diabetic: No ?    Tobacco smoker: No ?    Systolic Blood Pressure: 010 mmHg ?    Is BP treated: Yes ?    HDL Cholesterol: 51.3 mg/dL ?    Total Cholesterol: 169 mg/dL   ? ? ?  03/03/2021  ? 10:37 AM 11/18/2019  ?  2:52 PM 11/05/2018  ? 11:11 AM  ?Depression screen PHQ 2/9  ?Decreased Interest 2 0 0  ?Down, Depressed, Hopeless 2 0 0  ?PHQ - 2 Score 4 0 0  ?Altered sleeping 0 0 0  ?Tired, decreased energy 0 0 0  ?Change in appetite 0 0 0  ?Feeling bad or failure about yourself  0 0 0  ?Trouble concentrating 0 0 0  ?Moving slowly or fidgety/restless 0 0 0  ?Suicidal thoughts 0 0 0  ?PHQ-9 Score 4 0 0  ?Difficult doing work/chores Somewhat difficult Not difficult at all Not difficult at all  ?  ? ?Social History  ? ?Tobacco Use  ?Smoking Status Former  ? Types: Cigarettes  ? Quit date: 11/20/1982  ? Years since quitting: 39.2  ?Smokeless Tobacco Never  ?Tobacco Comments  ? 15 pack year history  ? ?BP Readings from Last 3 Encounters:  ?02/09/22 120/76  ?10/31/21 (!) 148/67  ?09/22/21 113/73  ? ?Pulse Readings from Last 3 Encounters:  ?10/31/21 72  ?09/22/21 67  ?09/15/21 74  ? ?Wt Readings from Last 3 Encounters:  ?10/31/21 175 lb 8 oz (79.6 kg)  ?09/15/21 175 lb 12.8 oz (79.7 kg)  ?08/09/21 172 lb (78 kg)  ? ?BMI Readings from Last 3 Encounters:  ?10/31/21 32.10 kg/m?  ?09/15/21 32.15 kg/m?  ?08/09/21 31.46 kg/m?  ? ? ?Assessment/Interventions: Review of patient past medical history, allergies, medications, health status, including review of consultants reports, laboratory and other test data, was performed as part of comprehensive evaluation and provision of chronic care management  services.  ? ?SDOH:  (Social Determinants of Health) assessments and interventions performed: No - performed 02/2021 AWV ? ?SDOH Screenings  ? ?Alcohol Screen: Low Risk   ? Last Alcohol Screening Score (AUDIT): 0  ?  Depression (PHQ2-9): Low Risk   ? PHQ-2 Score: 4  ?Financial Resource Strain: Low Risk   ? Difficulty of Paying Living Expenses: Not hard at all  ?Food Insecurity: No Food Insecurity  ? Worried About Charity fundraiser in the Last Year: Never true  ? Ran Out of Food in the Last Year: Never true  ?Housing: Low Risk   ? Last Housing Risk Score: 0  ?Physical Activity: Inactive  ? Days of Exercise per Week: 0 days  ? Minutes of Exercise per Session: 0 min  ?Social Connections: Not on file  ?Stress: Stress Concern Present  ? Feeling of Stress : To some extent  ?Tobacco Use: Medium Risk  ? Smoking Tobacco Use: Former  ? Smokeless Tobacco Use: Never  ? Passive Exposure: Not on file  ?Transportation Needs: No Transportation Needs  ? Lack of Transportation (Medical): No  ? Lack of Transportation (Non-Medical): No  ? ? ?CCM Care Plan ? ?Allergies  ?Allergen Reactions  ? Vibramycin [Doxycycline]   ?  Many years ago - "made me feel funny"  ? ? ?Medications Reviewed Today   ? ? Reviewed by Charlton Haws, RPH (Pharmacist) on 02/09/22 at 1204  Med List Status: <None>  ? ?Medication Order Taking? Sig Documenting Provider Last Dose Status Informant  ?atorvastatin (LIPITOR) 20 MG tablet [id number] Yes TAKE 1 TABLET BY MOUTH  DAILY Bedsole, Amy E, MD Taking Active Self  ?Cholecalciferol (D3 2000) 50 MCG (2000 UT) CAPS [id number] Yes Take 2,000 Units by mouth daily. [provider] Taking Active Self  ?losartan (COZAAR) 50 MG tablet [id number] Yes Take 1 tablet (50 mg total) by mouth daily. Jinny Sanders, MD Taking Active Self  ?metoprolol succinate (TOPROL-XL) 100 MG 24 hr tablet [id number] Yes TAKE 1 TABLET BY MOUTH  DAILY WITH OR IMMEDIATELY  FOLLOWING A MEAL Bedsole, Amy E, MD Taking Active Self   ?Naphazoline HCl (CLEAR EYES OP) [id number] Yes Place 1 drop into both eyes every 8 (eight) hours as needed (eye redness). [provider] Taking Active Self  ?sodium chloride (OCEAN) 0.65 % SOLN nasal spray [id number]

## 2022-02-09 NOTE — Patient Instructions (Signed)
Visit Information ? ?Phone number for Pharmacist: [phone] ? ? Goals Addressed   ?None ?  ? ? ?Care Plan : [place]  ?Updates made by Charlton Haws, RPH since 02/09/2022 12:00 AM  ?  ? ?Problem: Hypertension, Hyperlipidemia, and Osteoporosis, Breast Cancer   ?Priority: High  ?  ? ?Long-Range Goal: Disease Management   ?Start Date: 07/05/2021  ?Expected End Date: 02/10/2023  ?This Visit's Progress: On track  ?Priority: High  ?Note:   ?Current Barriers:  ?None identified ? ?Pharmacist Clinical Goal(s):  ?Patient will contact provider office for questions/concerns as evidenced notation of same in electronic health record through collaboration with PharmD and provider.  ? ?Interventions: ?1:1 collaboration with Jinny Sanders, MD regarding development and update of comprehensive plan of care as evidenced by provider attestation and co-signature ?Inter-disciplinary care team collaboration (see longitudinal plan of care) ?Comprehensive medication review performed; medication list updated in electronic medical record ? ?Hypertension (BP goal <140/90) ?-Controlled - per clinic readings  ?-Current home BP readings: 109/67, 120/76 (before meds) ?-Takes BP meds around 11:30a-12p ?-Current treatment: ?Metoprolol succinate 100 mg daily - Appropriate, Effective, Safe, Accessible ?Losartan 50 mg daily - Appropriate, Effective, Safe, Accessible ?Triamterene/HCTZ 37.5-25 mg daily - Appropriate, Effective, Safe, Accessible ?-Medications previously tried: none reported s ?-Educated on BP goals and benefits of medications for prevention of heart attack, stroke and kidney damage; ?-Counseled to monitor BP at home periodically ?-Recommended to continue current medication ? 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 ?-Current treatment: ?Atorvastatin 20 mg daily -  Appropriate, Query Effective ?-Medications previously tried: none   ?-Current dietary patterns: she avoids red meats and fried foods, increase vegetables ?-Current exercise habits: she targets 5000 steps per day, very active ?-Educated on Cholesterol goals; Importance of limiting foods high in cholesterol; ?Exercise goal of 150 minutes per week; ?-Recommended to continue current medication ? ?Breast Cancer -DCIS (Goal: prevent recurrence) ?-Controlled - although pt is concerned about tamoxifen side effects; she reports occasional hot flashes, currently tolerable; she read about liver and eye issues - provided reassurance about LFTs (normal 08/2021) and she follows regularly with eye doctor ?-Follows with Dr Jana Hakim. Dx 2018, R lumpectomy 07/2017. Mammogram 09/2020 abnormal. R lumpectomy 11/2020. No evidence of recurrence 10/2021. Plan 5 years of Tamoxifen. ?-Current treatment  ?Tamoxifen 20 mg daily (started 03/2021) - Appropriate, Effective, Safe, Accessible ?-Medications previously tried: n/a  ?-Recommended to continue current medication ? ?Osteopenia (Goal prevent fractures) ?-Controlled ?-Last DEXA Scan: 08/2019  ? T-Score femoral neck: -2.0 ? T-Score total hip: -1.5 ? T-Score lumbar spine: -1.8 ? 10-year probability of major osteoporotic fracture: 6.0% ? 10-year probability of hip fracture: 1.5% ?-Patient is not a candidate for pharmacologic treatment ?-Current treatment  ?None ?-Medications previously tried: n/a  ?-Recommend [phone] units of vitamin D daily. Recommend 1200 mg of calcium daily from dietary and supplemental sources. ? ?Patient Goals/Self-Care Activities ?Patient will:  ?- take medications as prescribed ?-engage in dietary modifications by continue to limit foods high in cholesterol ?-contact provider office for questions/concerns ?  ?  ? ?Patient verbalizes understanding of instructions and care plan provided today and agrees to view in [place]. Active MyChart status confirmed with patient.    ?Telephone follow up appointment with pharmacy team member scheduled for: 8 months ? ?Charlene Brooke, PharmD, BCACP ?Clinical Pharmacist ?[facility]  at [facility] ?[phone] ?  ?

## 2022-02-14 ENCOUNTER — Ambulatory Visit: Payer: Medicare Other | Admitting: Family Medicine

## 2022-02-17 DIAGNOSIS — M8580 Other specified disorders of bone density and structure, unspecified site: Secondary | ICD-10-CM | POA: Diagnosis not present

## 2022-02-17 DIAGNOSIS — I10 Essential (primary) hypertension: Secondary | ICD-10-CM | POA: Diagnosis not present

## 2022-02-17 DIAGNOSIS — E785 Hyperlipidemia, unspecified: Secondary | ICD-10-CM | POA: Diagnosis not present

## 2022-02-17 DIAGNOSIS — Z86000 Personal history of in-situ neoplasm of breast: Secondary | ICD-10-CM

## 2022-02-20 ENCOUNTER — Ambulatory Visit: Payer: Medicare Other | Admitting: Gastroenterology

## 2022-03-06 ENCOUNTER — Ambulatory Visit: Payer: Medicare Other

## 2022-03-13 ENCOUNTER — Telehealth: Payer: Self-pay

## 2022-03-13 ENCOUNTER — Ambulatory Visit (INDEPENDENT_AMBULATORY_CARE_PROVIDER_SITE_OTHER): Payer: Medicare Other

## 2022-03-13 VITALS — Ht 62.0 in | Wt 175.0 lb

## 2022-03-13 DIAGNOSIS — Z0000 Encounter for general adult medical examination without abnormal findings: Secondary | ICD-10-CM | POA: Diagnosis not present

## 2022-03-13 DIAGNOSIS — Z780 Asymptomatic menopausal state: Secondary | ICD-10-CM | POA: Diagnosis not present

## 2022-03-13 NOTE — Telephone Encounter (Signed)
This needs to come from her pcp so I will send it along  ?Thanks  ?

## 2022-03-13 NOTE — Telephone Encounter (Signed)
During AWV pt asks if she would be able to get a handicap placard for her car due to her knee and back issues? Thank you. ? ?

## 2022-03-13 NOTE — Addendum Note (Signed)
Addended by: Randal Buba K on: 03/13/2022 02:49 PM ? ? Modules accepted: Orders ? ?

## 2022-03-13 NOTE — Patient Instructions (Signed)
Ms. Hilgeman , ?Thank you for taking time to come for your Medicare Wellness Visit. I appreciate your ongoing commitment to your health goals. Please review the following plan we discussed and let me know if I can assist you in the future.  ? ?Screening recommendations/referrals: ?Colonoscopy: Done 05/15/2019 Repeat every 2 years ? ?Mammogram: Done 10/12/2021. Repeat annually ? ?Bone Density: Done 09/15/2019. Repeat every 2 years ? ?Recommended yearly ophthalmology/optometry visit for glaucoma screening and checkup ?Recommended yearly dental visit for hygiene and checkup ? ?Vaccinations: ?Influenza vaccine: Done 09/11/2020 Repeat annually ? ?Pneumococcal vaccine: Done 04/20/2006, 01/09/2014 and 09/04/2017. ?Tdap vaccine: Done 04/20/2006 Repeat in 10 years ? ?Shingles vaccine: Done 08/02/2021 and 10/24/2021.   ?Covid-19:Done 12/24/2019, 01/14/2020, 08/22/2020. ? ?Advanced directives: Advance directive discussed with you today. Even though you declined this today, please call our office should you change your mind, and we can give you the proper paperwork for you to fill out. ? ? ?Conditions/risks identified: Aim for 30 minutes of exercise or brisk walking, 6-8 glasses of water, and 5 servings of fruits and vegetables each day. ?KEEP UP THE GOOD WORK!!! ? ?Next appointment: Follow up in one year for your annual wellness visit 03/15/2023 @ 12 PM. ? ? ?Preventive Care 79 Years and Older, Female ?Preventive care refers to lifestyle choices and visits with your health care provider that can promote health and wellness. ?What does preventive care include? ?A yearly physical exam. This is also called an annual well check. ?Dental exams once or twice a year. ?Routine eye exams. Ask your health care provider how often you should have your eyes checked. ?Personal lifestyle choices, including: ?Daily care of your teeth and gums. ?Regular physical activity. ?Eating a healthy diet. ?Avoiding tobacco and drug use. ?Limiting alcohol  use. ?Practicing safe sex. ?Taking low-dose aspirin every day. ?Taking vitamin and mineral supplements as recommended by your health care provider. ?What happens during an annual well check? ?The services and screenings done by your health care provider during your annual well check will depend on your age, overall health, lifestyle risk factors, and family history of disease. ?Counseling  ?Your health care provider may ask you questions about your: ?Alcohol use. ?Tobacco use. ?Drug use. ?Emotional well-being. ?Home and relationship well-being. ?Sexual activity. ?Eating habits. ?History of falls. ?Memory and ability to understand (cognition). ?Work and work Statistician. ?Reproductive health. ?Screening  ?You may have the following tests or measurements: ?Height, weight, and BMI. ?Blood pressure. ?Lipid and cholesterol levels. These may be checked every 5 years, or more frequently if you are over 2 years old. ?Skin check. ?Lung cancer screening. You may have this screening every year starting at age 2 if you have a 30-pack-year history of smoking and currently smoke or have quit within the past 15 years. ?Fecal occult blood test (FOBT) of the stool. You may have this test every year starting at age 52. ?Flexible sigmoidoscopy or colonoscopy. You may have a sigmoidoscopy every 5 years or a colonoscopy every 10 years starting at age 46. ?Hepatitis C blood test. ?Hepatitis B blood test. ?Sexually transmitted disease (STD) testing. ?Diabetes screening. This is done by checking your blood sugar (glucose) after you have not eaten for a while (fasting). You may have this done every 1-3 years. ?Bone density scan. This is done to screen for osteoporosis. You may have this done starting at age 71. ?Mammogram. This may be done every 1-2 years. Talk to your health care provider about how often you should have regular mammograms. ?Talk  with your health care provider about your test results, treatment options, and if necessary,  the need for more tests. ?Vaccines  ?Your health care provider may recommend certain vaccines, such as: ?Influenza vaccine. This is recommended every year. ?Tetanus, diphtheria, and acellular pertussis (Tdap, Td) vaccine. You may need a Td booster every 10 years. ?Zoster vaccine. You may need this after age 8. ?Pneumococcal 13-valent conjugate (PCV13) vaccine. One dose is recommended after age 1. ?Pneumococcal polysaccharide (PPSV23) vaccine. One dose is recommended after age 33. ?Talk to your health care provider about which screenings and vaccines you need and how often you need them. ?This information is not intended to replace advice given to you by your health care provider. Make sure you discuss any questions you have with your health care provider. ?Document Released: 12/03/2015 Document Revised: 07/26/2016 Document Reviewed: 09/07/2015 ?Elsevier Interactive Patient Education ? 2017 [place]. ? ?Fall Prevention in the Home ?Falls can cause injuries. They can happen to people of all ages. There are many things you can do to make your home safe and to help prevent falls. ?What can I do on the outside of my home? ?Regularly fix the edges of walkways and driveways and fix any cracks. ?Remove anything that might make you trip as you walk through a door, such as a raised step or threshold. ?Trim any bushes or trees on the path to your home. ?Use bright outdoor lighting. ?Clear any walking paths of anything that might make someone trip, such as rocks or tools. ?Regularly check to see if handrails are loose or broken. Make sure that both sides of any steps have handrails. ?Any raised decks and porches should have guardrails on the edges. ?Have any leaves, snow, or ice cleared regularly. ?Use sand or salt on walking paths during winter. ?Clean up any spills in your garage right away. This includes oil or grease spills. ?What can I do in the bathroom? ?Use night lights. ?Install grab bars by the toilet and in the  tub and shower. Do not use towel bars as grab bars. ?Use non-skid mats or decals in the tub or shower. ?If you need to sit down in the shower, use a plastic, non-slip stool. ?Keep the floor dry. Clean up any water that spills on the floor as soon as it happens. ?Remove soap buildup in the tub or shower regularly. ?Attach bath mats securely with double-sided non-slip rug tape. ?Do not have throw rugs and other things on the floor that can make you trip. ?What can I do in the bedroom? ?Use night lights. ?Make sure that you have a light by your bed that is easy to reach. ?Do not use any sheets or blankets that are too big for your bed. They should not hang down onto the floor. ?Have a firm chair that has side arms. You can use this for support while you get dressed. ?Do not have throw rugs and other things on the floor that can make you trip. ?What can I do in the kitchen? ?Clean up any spills right away. ?Avoid walking on wet floors. ?Keep items that you use a lot in easy-to-reach places. ?If you need to reach something above you, use a strong step stool that has a grab bar. ?Keep electrical cords out of the way. ?Do not use floor polish or wax that makes floors slippery. If you must use wax, use non-skid floor wax. ?Do not have throw rugs and other things on the floor that can make you  trip. ?What can I do with my stairs? ?Do not leave any items on the stairs. ?Make sure that there are handrails on both sides of the stairs and use them. Fix handrails that are broken or loose. Make sure that handrails are as long as the stairways. ?Check any carpeting to make sure that it is firmly attached to the stairs. Fix any carpet that is loose or worn. ?Avoid having throw rugs at the top or bottom of the stairs. If you do have throw rugs, attach them to the floor with carpet tape. ?Make sure that you have a light switch at the top of the stairs and the bottom of the stairs. If you do not have them, ask someone to add them for  you. ?What else can I do to help prevent falls? ?Wear shoes that: ?Do not have high heels. ?Have rubber bottoms. ?Are comfortable and fit you well. ?Are closed at the toe. Do not wear sandals. ?If you use a step

## 2022-03-13 NOTE — Progress Notes (Addendum)
? ?Subjective:  ? Kristy [name] is a 81 y.o. female who presents for Medicare Annual (Subsequent) preventive examination. ?Virtual Visit via Telephone Note ? ?I connected with  Kristy [name] on 03/13/22 at 12:00 PM EDT by telephone and verified that I am speaking with the correct person using two identifiers. ? ?Location: ?Patient: HOME ?Provider: [facility] ?Persons participating in the virtual visit: patient/Nurse Health Advisor ?  ?I discussed the limitations, risks, security and privacy concerns of performing an evaluation and management service by telephone and the availability of in person appointments. The patient expressed understanding and agreed to proceed. ? ?Interactive audio and video telecommunications were attempted between this nurse and patient, however failed, due to patient having technical difficulties OR patient did not have access to video capability.  We continued and completed visit with audio only. ? ?Some vital signs may be absent or patient reported.  ? ?Chriss Driver, LPN ? ?Review of Systems    ? ?Cardiac Risk Factors include: advanced age (>16mn, >>3women);hypertension;dyslipidemia;sedentary lifestyle;obesity (BMI >30kg/m2) ? ?   ?Objective:  ?  ?Today's Vitals  ? 03/13/22 1205  ?Weight: 175 lb (79.4 kg)  ?Height: '5\' 2"'$  (1.575 m)  ? ?Body mass index is 32.01 kg/m?. ? ? ?  03/13/2022  ? 12:10 PM 09/15/2021  ?  1:14 PM 03/03/2021  ? 10:35 AM 02/08/2021  ?  9:11 AM 01/11/2021  ? 11:17 AM 01/04/2021  ?  9:55 AM 12/16/2020  ? 12:30 PM  ?Advanced Directives  ?Does Patient Have a Medical Advance Directive? No No No No No No No  ?Would patient like information on creating a medical advance directive? No - Patient declined Yes (Inpatient - patient defers creating a medical advance directive at this time - Information given) No - Patient declined No - Patient declined No - Patient declined  No - Patient declined  ? ? ?Current Medications (verified) ?Outpatient Encounter Medications as of  03/13/2022  ?Medication Sig  ? atorvastatin (LIPITOR) 20 MG tablet TAKE 1 TABLET BY MOUTH  DAILY  ? Cholecalciferol (D3 2000) 50 MCG (2000 UT) CAPS Take 2,000 Units by mouth daily.  ? FLUZONE HIGH-DOSE QUADRIVALENT 0.7 ML SUSY   ? losartan (COZAAR) 50 MG tablet Take 1 tablet (50 mg total) by mouth daily.  ? metoprolol succinate (TOPROL-XL) 100 MG 24 hr tablet TAKE 1 TABLET BY MOUTH  DAILY WITH OR IMMEDIATELY  FOLLOWING A MEAL  ? Naphazoline HCl (CLEAR EYES OP) Place 1 drop into both eyes every 8 (eight) hours as needed (eye redness).  ? PFIZER COVID-19 VAC BIVALENT injection   ? sodium chloride (OCEAN) 0.65 % SOLN nasal spray Place 1 spray into both nostrils every 4 (four) hours as needed for congestion.  ? tamoxifen (NOLVADEX) 20 MG tablet Take 20 mg by mouth daily.  ? triamterene-hydrochlorothiazide (MAXZIDE-25) 37.5-25 MG tablet TAKE 1 TABLET BY MOUTH  DAILY  ? valACYclovir (VALTREX) 500 MG tablet Take 1 tablet (500 mg total) by mouth daily. Take daily for suppressive therapy  ? ?No facility-administered encounter medications on file as of 03/13/2022.  ? ? ?Allergies (verified) ?Vibramycin [doxycycline]  ? ?History: ?Past Medical History:  ?Diagnosis Date  ? Allergy   ? Arterial fibromuscular [place] 11[place]  ? Left carotid artery; diagnosed by MRI; followed by Dr. [facility]ulda Humphreyof [place] ? Arthritis   ? rt knee, foot  ? [place] 11/29/2020  ? right breast DCIS  ? Cataract   ? forming  right eye   ? Chronic kidney disease   ? 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 Constipation   ? uses stool softener PRN- uses once a week to once every 2 weeks   ? Dyspnea   ? 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 Gallbladder disease   ? GERD (gastroesophageal reflux disease)   ? Hyperlipidemia   ? Hypertension   ? For 16 years; urinary catecholamines within normal limits 12/09; renal Doppler ultrasound showed no evidence for renal artery stenosis  ? Normal echocardiogram 02/2009  ?  LVEF 65%; no regional wall motion abnormalities; normal RV size and function; pulmonic valve had increased gradient across w/ peak gradient of about 36 mmHg (range of moderate pulmonic stenosis) followup showed normal valve  ? Papilloma of breast   ? right  ? Personal history of radiation therapy   ? Tinnitus of left ear   ? ?Past Surgical History:  ?Procedure Laterality Date  ? ANGIOPLASTY    ? 2012  ? BREAST BIOPSY Right 2006  ? benign  ? BREAST BIOPSY Right 11/09/2020  ? BREAST BIOPSY Right 11/23/2020  ? BREAST BIOPSY Right 11/29/2020  ? x2  ? BREAST EXCISIONAL BIOPSY Right 07/2017  ? benign  ? BREAST LUMPECTOMY Right 12/16/2020  ? BREAST LUMPECTOMY WITH RADIOACTIVE SEED LOCALIZATION Right 08/14/2017  ? Procedure: RIGHT BREAST LUMPECTOMY WITH RADIOACTIVE SEED LOCALIZATION;  Surgeon: [name], MD;  Location: [place];  Service: General;  Laterality: Right;  ? BREAST LUMPECTOMY WITH RADIOACTIVE SEED LOCALIZATION Right 12/16/2020  ? Procedure: RADIOACTIVE SEED GUIDED TIMES 3 RIGHT BREAST LUMPECTOMY;  Surgeon: [name], MD;  Location: [place];  Service: General;  Laterality: Right;  ? Cardiolyte  09/2006  ? Neg  ? CHOLECYSTECTOMY N/A 09/22/2021  ? Procedure: LAPAROSCOPIC CHOLECYSTECTOMY WITH  INTRAOPERATIVE CHOLANGIOGRAM;  Surgeon: [name], MD;  Location: [place];  Service: General;  Laterality: N/A;  ? COLONOSCOPY    ? KNEE CARTILAGE SURGERY    ? right knee  ? POLYPECTOMY    ? RE-EXCISION OF BREAST LUMPECTOMY Right 01/11/2021  ? Procedure: RE-EXCISION OF RIGHT BREAST LUMPECTOMY;  Surgeon: [name], MD;  Location: [place];  Service: General;  Laterality: Right;  ? TOTAL ABDOMINAL HYSTERECTOMY    ? no cervix  ? UPPER GASTROINTESTINAL ENDOSCOPY    ? ?Family History  ?Problem Relation Age of Onset  ? Prostate cancer Father   ? Lung cancer Father   ? Colon cancer Maternal Uncle   ? Colon cancer Paternal Uncle   ? Colon polyps Sister   ?  Coronary artery disease Paternal Grandmother   ? Goiter Other   ?     ?  ? Esophageal cancer Neg Hx   ? Stomach cancer Neg Hx   ? Rectal cancer Neg Hx   ? Bladder Cancer Neg Hx   ? Kidney cancer Neg Hx   ? ?Social History  ? ?Socioeconomic History  ? Marital status: Widowed  ?  Spouse name: Not on file  ? Number of children: 0  ? Years of education: Not on file  ? Highest education level: Not on file  ?Occupational History  ? Occupation: Merchant navy officer  ?  Employer: RETIRED  ?  Comment: Retired  ?Tobacco Use  ? Smoking status: Former  ?  Types: Cigarettes  ?  Quit date: 11/20/1982  ?  Years since quitting: 39.3  ? Smokeless tobacco: Never  ? Tobacco comments:  ?  15 pack year history  ?Vaping Use  ? Vaping Use: Never used  ?Substance and Sexual Activity  ? Alcohol use: No  ? Drug use: No  ? Sexual activity: Not on file  ?Other Topics Concern  ? Not on file  ?Social History Narrative  ? Widowed in 2007-spouse died from colon cancer.  ? Regular exercise at [facility].  ? Diet consists of fruits and veggies, snacks a lot.  ?    ? ?Social Determinants of Health  ? ?Financial Resource Strain: Low Risk   ? Difficulty of Paying Living Expenses: Not hard at all  ?Food Insecurity: No Food Insecurity  ? Worried About Charity fundraiser in the Last Year: Never true  ? Ran Out of Food in the Last Year: Never true  ?Transportation Needs: No Transportation Needs  ? Lack of Transportation (Medical): No  ? Lack of Transportation (Non-Medical): No  ?Physical Activity: Sufficiently Active  ? Days of Exercise per Week: 5 days  ? Minutes of Exercise per Session: 50 min  ?Stress: No Stress Concern Present  ? Feeling of Stress : Not at all  ?Social Connections: Moderately Isolated  ? Frequency of Communication with Friends and Family: More than three times a week  ? Frequency of Social Gatherings with Friends and Family: More than three times a week  ? Attends Religious Services: 1 to 4 times per year  ? Active Member of Clubs or  Organizations: No  ? Attends Archivist Meetings: Never  ? Marital Status: Widowed  ? ? ?Tobacco Counseling ?Counseling given: Not Answered ?Tobacco comments: 15 pack year history ? ? ?Clinical Intake: ? ?P

## 2022-03-14 ENCOUNTER — Other Ambulatory Visit: Payer: Self-pay | Admitting: Family Medicine

## 2022-03-14 ENCOUNTER — Encounter (HOSPITAL_COMMUNITY): Payer: Self-pay

## 2022-03-14 DIAGNOSIS — Z1231 Encounter for screening mammogram for malignant neoplasm of breast: Secondary | ICD-10-CM

## 2022-03-14 NOTE — Telephone Encounter (Signed)
Signed and placed in my out box.  Ready for pickup by patient. ?

## 2022-03-14 NOTE — Telephone Encounter (Signed)
Placed a medical certification for disability parking placard in Dr Rometta Emery in basket if needed. ?

## 2022-03-15 NOTE — Telephone Encounter (Signed)
Kristy [name] notified by telephone that her handicap placard application is ready to be picked up at the front desk. ?

## 2022-03-27 ENCOUNTER — Other Ambulatory Visit: Payer: Self-pay | Admitting: Family Medicine

## 2022-03-29 ENCOUNTER — Ambulatory Visit: Payer: Medicare Other | Admitting: Gastroenterology

## 2022-04-03 ENCOUNTER — Telehealth: Payer: Self-pay | Admitting: Family Medicine

## 2022-04-03 DIAGNOSIS — R7303 Prediabetes: Secondary | ICD-10-CM

## 2022-04-03 DIAGNOSIS — E782 Mixed hyperlipidemia: Secondary | ICD-10-CM

## 2022-04-03 NOTE — Telephone Encounter (Signed)
-----   Message from Velna Hatchet, RT sent at 03/20/2022  9:44 AM EDT ----- ?Regarding: Lab Tue 04/04/22 ?Patient is scheduled for cpx, please order future labs.  Thanks, Anda Kraft ? ? ?

## 2022-04-04 ENCOUNTER — Other Ambulatory Visit (INDEPENDENT_AMBULATORY_CARE_PROVIDER_SITE_OTHER): Payer: Medicare Other

## 2022-04-04 DIAGNOSIS — R7303 Prediabetes: Secondary | ICD-10-CM

## 2022-04-04 DIAGNOSIS — E782 Mixed hyperlipidemia: Secondary | ICD-10-CM | POA: Diagnosis not present

## 2022-04-04 LAB — COMPREHENSIVE METABOLIC PANEL
ALT: 15 U/L (ref 0–35)
AST: 18 U/L (ref 0–37)
Albumin: 4 g/dL (ref 3.5–5.2)
Alkaline Phosphatase: 30 U/L — ABNORMAL LOW (ref 39–117)
BUN: 16 mg/dL (ref 6–23)
CO2: 24 mEq/L (ref 19–32)
Calcium: 9.3 mg/dL (ref 8.4–10.5)
Chloride: 108 mEq/L (ref 96–112)
Creatinine, Ser: 0.78 mg/dL (ref 0.40–1.20)
GFR: 72.53 mL/min (ref >60.00)
Glucose, Bld: 107 mg/dL — ABNORMAL HIGH (ref 70–99)
Potassium: 3.4 mEq/L — ABNORMAL LOW (ref 3.5–5.1)
Sodium: 140 mEq/L (ref 135–145)
Total Bilirubin: 0.4 mg/dL (ref 0.2–1.2)
Total Protein: 6.7 g/dL (ref 6.0–8.3)

## 2022-04-04 LAB — LIPID PANEL
Cholesterol: 99 mg/dL (ref 0–200)
HDL: 46.2 mg/dL (ref >39.00)
LDL Cholesterol: 39 mg/dL (ref 0–99)
NonHDL: 53
Total CHOL/HDL Ratio: 2
Triglycerides: 70 mg/dL (ref 0.0–149.0)
VLDL: 14 mg/dL (ref 0.0–40.0)

## 2022-04-04 LAB — HEMOGLOBIN A1C: Hgb A1c MFr Bld: 6.1 % (ref 4.6–6.5)

## 2022-04-04 NOTE — Progress Notes (Signed)
No critical labs need to be addressed urgently. We will discuss labs in detail at upcoming office visit.   

## 2022-04-08 ENCOUNTER — Other Ambulatory Visit: Payer: Self-pay | Admitting: Family Medicine

## 2022-04-11 ENCOUNTER — Encounter: Payer: Self-pay | Admitting: Family Medicine

## 2022-04-11 ENCOUNTER — Ambulatory Visit (INDEPENDENT_AMBULATORY_CARE_PROVIDER_SITE_OTHER): Payer: Medicare Other | Admitting: Family Medicine

## 2022-04-11 VITALS — BP 112/70 | HR 68 | Temp 98.3°F | Ht 62.0 in | Wt 173.4 lb

## 2022-04-11 DIAGNOSIS — D0511 Intraductal carcinoma in situ of right breast: Secondary | ICD-10-CM

## 2022-04-11 DIAGNOSIS — I10 Essential (primary) hypertension: Secondary | ICD-10-CM | POA: Diagnosis not present

## 2022-04-11 DIAGNOSIS — I773 Arterial fibromuscular dysplasia: Secondary | ICD-10-CM | POA: Diagnosis not present

## 2022-04-11 DIAGNOSIS — Z0000 Encounter for general adult medical examination without abnormal findings: Secondary | ICD-10-CM

## 2022-04-11 DIAGNOSIS — I701 Atherosclerosis of renal artery: Secondary | ICD-10-CM

## 2022-04-11 DIAGNOSIS — I6523 Occlusion and stenosis of bilateral carotid arteries: Secondary | ICD-10-CM

## 2022-04-11 DIAGNOSIS — E782 Mixed hyperlipidemia: Secondary | ICD-10-CM

## 2022-04-11 DIAGNOSIS — I5189 Other ill-defined heart diseases: Secondary | ICD-10-CM

## 2022-04-11 DIAGNOSIS — M818 Other osteoporosis without current pathological fracture: Secondary | ICD-10-CM | POA: Diagnosis not present

## 2022-04-11 DIAGNOSIS — R7303 Prediabetes: Secondary | ICD-10-CM

## 2022-04-11 NOTE — Assessment & Plan Note (Signed)
Due for repeat DEXA in 09/2022

## 2022-04-11 NOTE — Assessment & Plan Note (Signed)
Chronic, improving with diet changes.

## 2022-04-11 NOTE — Assessment & Plan Note (Signed)
Followed by vascular yearly.

## 2022-04-11 NOTE — Assessment & Plan Note (Signed)
Stable, chronic.  Continue current medication. ? ? ?Atorvastatin 20 mg daily ?

## 2022-04-11 NOTE — Assessment & Plan Note (Signed)
active treatment on tamoxifem, followed by oncology Dr. Griffith Citron

## 2022-04-11 NOTE — Progress Notes (Signed)
Patient ID: Kristy [name], female    DOB: Aug 03, 1941, 81 y.o.   MRN: [id number]  This visit was conducted in person.  BP 112/70   Pulse 68   Temp 98.3 F (36.8 C) (Oral)   Ht '5\' 2"'$  (1.575 m)   Wt 173 lb 6 oz (78.6 kg)   SpO2 96%   BMI 31.71 kg/m    CC:  Chief Complaint  Patient presents with   Annual Exam    Part 2    Subjective:   HPI: Kristy [name] is a 81 y.o. female presenting on 04/11/2022 for Annual Exam (Part 2)  The patient presents for  complete physical and review of chronic health problems. He/She also has the following acute concerns today:   right knee pain from OA... worsening.  Having joint pain from tamoxifem. Using tylenol for pain, topical voltaren gel.  No new falls.  Has been seen by Dr. Lorelei Pont in past  The patient saw a LPN or RN for medicare wellness visit.  Prevention and wellness was reviewed in detail. Note reviewed and important notes copied below.  Wt Readings from Last 3 Encounters:  04/11/22 173 lb 6 oz (78.6 kg)  03/13/22 175 lb (79.4 kg)  10/31/21 175 lb 8 oz (79.6 kg)    Prediabetes  stable control. Lab Results  Component Value Date   HGBA1C 6.1 04/04/2022    Hypertension:   Well controlled on metoprolol, losartan 50 mg daily and maxide  BP Readings from Last 3 Encounters:  04/11/22 112/70  02/09/22 120/76  10/31/21 (!) 148/67  Using medication without problems or lightheadedness:  Chest pain with exertion: Edema: Short of breath: Average home BPs: Other issues:   Elevated Cholesterol:  LDL at goal < 70 on  atorvastatin 20 mg daily Lab Results  Component Value Date   CHOL 99 04/04/2022   HDL 46.20 04/04/2022   LDLCALC 39 04/04/2022   LDLDIRECT 140.3 03/26/2013   TRIG 70.0 04/04/2022   CHOLHDL 2 04/04/2022  Using medications without problems: none Muscle aches:  none Diet compliance: good Exercise: walking. Other complaints:    Fibromuscular dysplasia/Renal artery stenosis/carotid artery stenosis  followed by vascular.  Hx of breast cancer, active treatment on tamoxifem, followed by oncology Dr. Griffith Citron     Relevant past medical, surgical, family and social history reviewed and updated as indicated. Interim medical history since our last visit reviewed. Allergies and medications reviewed and updated. Outpatient Medications Prior to Visit  Medication Sig Dispense Refill   atorvastatin (LIPITOR) 20 MG tablet TAKE 1 TABLET BY MOUTH  DAILY 90 tablet 3   Glycerin-Hypromellose-PEG 400 (VISINE DRY EYE) 0.2-0.2-1 % SOLN Apply to eye.     losartan (COZAAR) 50 MG tablet Take 1 tablet (50 mg total) by mouth daily. 90 tablet 3   metoprolol succinate (TOPROL-XL) 100 MG 24 hr tablet TAKE 1 TABLET BY MOUTH  DAILY WITH OR IMMEDIATELY  FOLLOWING A MEAL 90 tablet 0   Naphazoline HCl (CLEAR EYES OP) Place 1 drop into both eyes every 8 (eight) hours as needed (eye redness).     sodium chloride (OCEAN) 0.65 % SOLN nasal spray Place 1 spray into both nostrils every 4 (four) hours as needed for congestion.     tamoxifen (NOLVADEX) 20 MG tablet Take 20 mg by mouth daily.     triamterene-hydrochlorothiazide (MAXZIDE-25) 37.5-25 MG tablet TAKE 1 TABLET BY MOUTH  DAILY 90 tablet 0   valACYclovir (VALTREX) 500 MG tablet Take 1 tablet (500  mg total) by mouth daily. Take daily for suppressive therapy 30 tablet 11   Cholecalciferol (D3 2000) 50 MCG (2000 UT) CAPS Take 2,000 Units by mouth daily.     FLUZONE HIGH-DOSE QUADRIVALENT 0.7 ML SUSY      PFIZER COVID-19 VAC BIVALENT injection      No facility-administered medications prior to visit.     Per HPI unless specifically indicated in ROS section below Review of Systems  Constitutional:  Negative for fatigue and fever.  HENT:  Negative for congestion.   Eyes:  Negative for pain.  Respiratory:  Negative for cough and shortness of breath.   Cardiovascular:  Negative for chest pain, palpitations and leg swelling.  Gastrointestinal:  Negative for abdominal  pain.  Genitourinary:  Negative for dysuria and vaginal bleeding.  Musculoskeletal:  Negative for back pain.  Neurological:  Negative for syncope, light-headedness and headaches.  Psychiatric/Behavioral:  Negative for dysphoric mood.   Objective:  BP 112/70   Pulse 68   Temp 98.3 F (36.8 C) (Oral)   Ht '5\' 2"'$  (1.575 m)   Wt 173 lb 6 oz (78.6 kg)   SpO2 96%   BMI 31.71 kg/m   Wt Readings from Last 3 Encounters:  04/11/22 173 lb 6 oz (78.6 kg)  03/13/22 175 lb (79.4 kg)  10/31/21 175 lb 8 oz (79.6 kg)      Physical Exam Vitals and nursing note reviewed.  Constitutional:      General: She is not in acute distress.    Appearance: Normal appearance. She is well-developed. She is not ill-appearing or toxic-appearing.  HENT:     Head: Normocephalic.     Right Ear: Hearing, tympanic membrane, ear canal and external ear normal.     Left Ear: Hearing, tympanic membrane, ear canal and external ear normal.     Nose: Nose normal.  Eyes:     General: Lids are normal. Lids are everted, no foreign bodies appreciated.     Conjunctiva/sclera: Conjunctivae normal.     Pupils: Pupils are equal, round, and reactive to light.  Neck:     Thyroid: No thyroid mass or thyromegaly.     Vascular: No carotid bruit.     Trachea: Trachea normal.  Cardiovascular:     Rate and Rhythm: Normal rate and regular rhythm.     Heart sounds: Normal heart sounds, S1 normal and S2 normal. No murmur heard.   No gallop.  Pulmonary:     Effort: Pulmonary effort is normal. No respiratory distress.     Breath sounds: Normal breath sounds. No wheezing, rhonchi or rales.  Abdominal:     General: Bowel sounds are normal. There is no distension or abdominal bruit.     Palpations: Abdomen is soft. There is no fluid wave or mass.     Tenderness: There is no abdominal tenderness. There is no guarding or rebound.     Hernia: No hernia is present.  Musculoskeletal:     Cervical back: Normal range of motion and neck  supple.  Lymphadenopathy:     Cervical: No cervical adenopathy.  Skin:    General: Skin is warm and dry.     Findings: No rash.  Neurological:     Mental Status: She is alert.     Cranial Nerves: No cranial nerve deficit.     Sensory: No sensory deficit.  Psychiatric:        Mood and Affect: Mood is not anxious or depressed.  Speech: Speech normal.        Behavior: Behavior normal. Behavior is cooperative.        Judgment: Judgment normal.      Results for orders placed or performed in visit on 04/04/22  Comprehensive metabolic panel  Result Value Ref Range   Sodium 140 135 - 145 mEq/L   Potassium 3.4 (L) 3.5 - 5.1 mEq/L   Chloride 108 96 - 112 mEq/L   CO2 24 19 - 32 mEq/L   Glucose, Bld 107 (H) 70 - 99 mg/dL   BUN 16 6 - 23 mg/dL   Creatinine, Ser 0.78 0.40 - 1.20 mg/dL   Total Bilirubin 0.4 0.2 - 1.2 mg/dL   Alkaline Phosphatase 30 (L) 39 - 117 U/L   AST 18 0 - 37 U/L   ALT 15 0 - 35 U/L   Total Protein 6.7 6.0 - 8.3 g/dL   Albumin 4.0 3.5 - 5.2 g/dL   GFR 72.53 >60.00 mL/min   Calcium 9.3 8.4 - 10.5 mg/dL  Lipid panel  Result Value Ref Range   Cholesterol 99 0 - 200 mg/dL   Triglycerides 70.0 0.0 - 149.0 mg/dL   HDL 46.20 >39.00 mg/dL   VLDL 14.0 0.0 - 40.0 mg/dL   LDL Cholesterol 39 0 - 99 mg/dL   Total CHOL/HDL Ratio 2    NonHDL 53.00   Hemoglobin A1c  Result Value Ref Range   Hgb A1c MFr Bld 6.1 4.6 - 6.5 %     COVID 19 screen:  No recent travel or known exposure to COVID19 The patient denies respiratory symptoms of COVID 19 at this time. The importance of social distancing was discussed today.   Assessment and Plan   The patient's preventative maintenance and recommended screening tests for an annual wellness exam were reviewed in full today. Brought up to date unless services declined.  Counselled on the importance of diet, exercise, and its role in overall health and mortality. The patient's FH and SH was reviewed, including their home life,  tobacco status, and drug and alcohol status.   Vaccines: up to date, COVID x 3, refused td and  uptodate with shingrix  Colon:04/2019, Dr. Fuller Plan,  Repeat in 2 years given precancer polyps.. plans to set up this week. DVE/pap: total hysterectomy   Mammo:  followed by oncology for breast cancer ,/2022 scheduled 11/23 DEXA: 08/2019  osteopenia,  scheduled 09/2022   Problem List Items Addressed This Visit     Carotid artery stenosis     Followed by vascular yearly.       Diastolic dysfunction   Ductal carcinoma in situ (DCIS) of right breast    active treatment on tamoxifem, followed by oncology Dr. Griffith Citron      Fibromuscular dysplasia ([place])    BP controlled  Followed by vascular/cardiology  Not sure why she is no longer on baby ASA... restart  She is on ARB, BP controlled.       Hyperlipidemia    Stable, chronic.  Continue current medication.   Atorvastatin 20 mg daily      HYPERTENSION, BENIGN ESSENTIAL, LABILE    Hx of FMD/renal artery stenosis  Stable, chronic.  Continue current medication.         Osteoporosis    Due for repeat DEXA in 09/2022       Prediabetes    Chronic, improving with diet changes.       Renal artery stenosis (H[facility])     Followed by vascular yearly.  Other Visit Diagnoses     Routine general medical examination at a health care facility    -  Primary          Eliezer Lofts, MD

## 2022-04-11 NOTE — Assessment & Plan Note (Signed)
BP controlled  Followed by vascular/cardiology  Not sure why she is no longer on baby ASA... restart  She is on ARB, BP controlled.

## 2022-04-11 NOTE — Patient Instructions (Addendum)
Make an appt for sports medicine referral with Dr. Lorelei Pont for right knee pain/OA possible steroid injection.

## 2022-04-11 NOTE — Assessment & Plan Note (Signed)
Hx of FMD/renal artery stenosis  Stable, chronic.  Continue current medication.

## 2022-04-19 ENCOUNTER — Encounter: Payer: Self-pay | Admitting: Family Medicine

## 2022-04-19 ENCOUNTER — Ambulatory Visit (INDEPENDENT_AMBULATORY_CARE_PROVIDER_SITE_OTHER): Payer: Medicare Other | Admitting: Family Medicine

## 2022-04-19 VITALS — BP 120/74 | HR 65 | Temp 98.3°F | Ht 62.0 in | Wt 173.4 lb

## 2022-04-19 DIAGNOSIS — M5136 Other intervertebral disc degeneration, lumbar region: Secondary | ICD-10-CM | POA: Diagnosis not present

## 2022-04-19 DIAGNOSIS — M51369 Other intervertebral disc degeneration, lumbar region without mention of lumbar back pain or lower extremity pain: Secondary | ICD-10-CM

## 2022-04-19 DIAGNOSIS — M1711 Unilateral primary osteoarthritis, right knee: Secondary | ICD-10-CM | POA: Diagnosis not present

## 2022-04-19 MED ORDER — TRIAMCINOLONE ACETONIDE 40 MG/ML IJ SUSP
40.0000 mg | Freq: Once | INTRAMUSCULAR | Status: AC
  Administered 2022-04-19: 40 mg via INTRA_ARTICULAR

## 2022-04-19 NOTE — Progress Notes (Signed)
Kristy Pape T. Kazmir Oki, MD, [place] [facility] Kristy [name], [id number]  Phone: [phone]  FAX: [phone]  Kristy [name] - 81 y.o. female  MRN [id number]  Date of Birth: [birth year]  Date: 04/19/2022  PCP: Jinny Sanders, MD  Referral: Jinny Sanders, MD  Chief Complaint  Patient presents with   Leg Pain    Right-started hurting on Sunday   Subjective:   Kristy [name] is a 81 y.o. very pleasant female patient with Body mass index is 31.72 kg/m. who presents with the following:  Pain patient presents with right-sided knee and leg pain that started on Sunday.  She is now limping quite a bit, and having pain throughout the entirety of the right knee.  She also is having pain in the posterior thigh as well as some pain laterally.  She occasionally has some pain in the buttocks, she is not really having any significant pain in the lower back itself.  She does have an effusion, and she tells me this was actually bigger few days ago.  I did review her plain x-rays again from 2021 and she does have advanced degenerative changes, with severe bone-on-bone pathology in the lateral compartment.  Review of Systems is noted in the HPI, as appropriate  Objective:   BP 120/74   Pulse 65   Temp 98.3 F (36.8 C) (Oral)   Ht '5\' 2"'$  (1.575 m)   Wt 173 lb 7 oz (78.7 kg)   SpO2 97%   BMI 31.72 kg/m   GEN: No acute distress; alert,appropriate. PULM: Breathing comfortably in no respiratory distress PSYCH: Normally interactive.   Right knee: Lacks 5 degrees of extension and flexion to 95. Minimal patellar movement with manipulation as possible Nontender at the patellar tendon and quad tendon She does have some medial joint line tenderness, lateral greater There is a popliteal cyst Any form of forced extension or flexion is tender. Mild ballotable effusion  Full range of motion at the hip with logroll Mild  trochanteric bursitis and tender to palpation of the trochanter on the right No significant tenderness about the lumbar spine and the paraspinous musculature from L1-S1.  No tenderness at the spinous process.  No lower extremity edema Neurovascularly intact throughout the lower extremity Strength is roughly 4+/5 throughout  Laboratory and Imaging Data:  Assessment and Plan:     ICD-10-CM   1. Primary osteoarthritis of right knee  M17.11     2. DDD (degenerative disc disease), lumbar  M51.36      Acute on chronic advanced knee osteoarthritis with exacerbation. Degenerative disc disease of lumbosacral spine.  Exacerbation.  I think multipart problem with secondary thigh pain likely driven from #1 and #2.  Predominant issue today I think is arthritis exacerbation of the knee.  Ice, relative rest over the next few days.  I am also going to do injectable corticosteroid injection on the right knee.  Patient is undergoing and complete her breast cancer treatment, we did talk about at some point she could contemplate a total knee arthroplasty.  She is going to possibly consider this after she is out of cancer treatment and has excellent follow-up.  Aspiration/Injection Procedure Note Kristy [name] Jan 21, 1941 Date of procedure: 04/19/2022  Procedure: Large Joint Aspiration / Injection of Knee, R Indications: Pain  Procedure Details Patient verbally consented to procedure. Risks, benefits, and alternatives explained. Sterilely prepped with Chloraprep. Ethyl cholride used for anesthesia. 9  cc Lidocaine 1% mixed with 1 mL of Kenalog 40 mg injected using the anteromedial approach without difficulty. No complications with procedure and tolerated well. Patient had decreased pain post-injection. Medication: 1 mL of Kenalog 40 mg   Medication Management during today's office visit: No orders of the defined types were placed in this encounter.  There are no discontinued  medications.  Orders placed today for conditions managed today: No orders of the defined types were placed in this encounter.   Follow-up if needed: No follow-ups on file.  [employer] One speech-to-text software was used for transcription in this dictation.  Possible transcriptional errors can occur using Editor, commissioning.   Signed,  Maud Deed. Enola Siebers, MD   Outpatient Encounter Medications as of 04/19/2022  Medication Sig   atorvastatin (LIPITOR) 20 MG tablet TAKE 1 TABLET BY MOUTH  DAILY   Glycerin-Hypromellose-PEG 400 (VISINE DRY EYE) 0.2-0.2-1 % SOLN Apply to eye.   losartan (COZAAR) 50 MG tablet Take 1 tablet (50 mg total) by mouth daily.   metoprolol succinate (TOPROL-XL) 100 MG 24 hr tablet TAKE 1 TABLET BY MOUTH  DAILY WITH OR IMMEDIATELY  FOLLOWING A MEAL   Naphazoline HCl (CLEAR EYES OP) Place 1 drop into both eyes every 8 (eight) hours as needed (eye redness).   sodium chloride (OCEAN) 0.65 % SOLN nasal spray Place 1 spray into both nostrils every 4 (four) hours as needed for congestion.   tamoxifen (NOLVADEX) 20 MG tablet Take 20 mg by mouth daily.   triamterene-hydrochlorothiazide (MAXZIDE-25) 37.5-25 MG tablet TAKE 1 TABLET BY MOUTH  DAILY   valACYclovir (VALTREX) 500 MG tablet Take 1 tablet (500 mg total) by mouth daily. Take daily for suppressive therapy   No facility-administered encounter medications on file as of 04/19/2022.

## 2022-04-19 NOTE — Addendum Note (Signed)
Addended by: Carter Kitten on: 04/19/2022 03:46 PM   Modules accepted: Orders

## 2022-05-22 ENCOUNTER — Encounter: Payer: Self-pay | Admitting: Family Medicine

## 2022-05-22 ENCOUNTER — Ambulatory Visit (INDEPENDENT_AMBULATORY_CARE_PROVIDER_SITE_OTHER): Payer: Medicare Other | Admitting: Family Medicine

## 2022-05-22 ENCOUNTER — Ambulatory Visit
Admission: RE | Admit: 2022-05-22 | Discharge: 2022-05-22 | Disposition: A | Discharge status: Home / Self Care | Payer: Medicare Other | Source: Ambulatory Visit | Attending: Family Medicine | Admitting: Family Medicine

## 2022-05-22 VITALS — BP 124/70 | HR 66 | Temp 98.1°F | Ht 62.0 in | Wt 172.2 lb

## 2022-05-22 DIAGNOSIS — R1032 Left lower quadrant pain: Secondary | ICD-10-CM

## 2022-05-22 DIAGNOSIS — R109 Unspecified abdominal pain: Secondary | ICD-10-CM | POA: Diagnosis not present

## 2022-05-22 DIAGNOSIS — R3129 Other microscopic hematuria: Secondary | ICD-10-CM | POA: Insufficient documentation

## 2022-05-22 DIAGNOSIS — R319 Hematuria, unspecified: Secondary | ICD-10-CM | POA: Diagnosis not present

## 2022-05-22 DIAGNOSIS — I700 Atherosclerosis of aorta: Secondary | ICD-10-CM | POA: Diagnosis not present

## 2022-05-22 LAB — BASIC METABOLIC PANEL WITH GFR
BUN: 13 mg/dL (ref 6–23)
CO2: 25 meq/L (ref 19–32)
Calcium: 9.9 mg/dL (ref 8.4–10.5)
Chloride: 102 meq/L (ref 96–112)
Creatinine, Ser: 0.79 mg/dL (ref 0.40–1.20)
GFR: 71.37 mL/min
Glucose, Bld: 95 mg/dL (ref 70–99)
Potassium: 3.8 meq/L (ref 3.5–5.1)
Sodium: 138 meq/L (ref 135–145)

## 2022-05-22 LAB — POC URINALSYSI DIPSTICK (AUTOMATED)
Bilirubin, UA: NEGATIVE
Glucose, UA: NEGATIVE
Ketones, UA: NEGATIVE
Leukocytes, UA: NEGATIVE
Nitrite, UA: NEGATIVE
Protein, UA: NEGATIVE
Spec Grav, UA: 1.01 (ref 1.010–1.025)
Urobilinogen, UA: 0.2 E.U./dL
pH, UA: 7 (ref 5.0–8.0)

## 2022-05-22 LAB — CBC WITH DIFFERENTIAL/PLATELET
Basophils Absolute: 0 10*3/uL (ref 0.0–0.1)
Basophils Relative: 0.4 % (ref 0.0–3.0)
Eosinophils Absolute: 0.1 10*3/uL (ref 0.0–0.7)
Eosinophils Relative: 1.7 % (ref 0.0–5.0)
HCT: 38.7 % (ref 36.0–46.0)
Hemoglobin: 13.1 g/dL (ref 12.0–15.0)
Lymphocytes Relative: 47 % — ABNORMAL HIGH (ref 12.0–46.0)
Lymphs Abs: 2.3 10*3/uL (ref 0.7–4.0)
MCHC: 33.9 g/dL (ref 30.0–36.0)
MCV: 98.1 fl (ref 78.0–100.0)
Monocytes Absolute: 0.3 10*3/uL (ref 0.1–1.0)
Monocytes Relative: 6.8 % (ref 3.0–12.0)
Neutro Abs: 2.2 10*3/uL (ref 1.4–7.7)
Neutrophils Relative %: 44.1 % (ref 43.0–77.0)
Platelets: 281 10*3/uL (ref 150.0–400.0)
RBC: 3.95 Mil/uL (ref 3.87–5.11)
RDW: 13.6 % (ref 11.5–15.5)
WBC: 5 10*3/uL (ref 4.0–10.5)

## 2022-05-22 NOTE — Progress Notes (Signed)
Patient ID: Kristy [name], female    DOB: [birth year], 81 y.o.   MRN: [id number]  This visit was conducted in person.  BP 124/70   Pulse 66   Temp 98.1 F (36.7 C) (Oral)   Ht '5\' 2"'$  (1.575 m)   Wt 172 lb 3 oz (78.1 kg)   SpO2 99%   BMI 31.49 kg/m    CC:  Chief Complaint  Patient presents with   Back Pain    Low   Abdominal Pain    LLQ-Nagging Ache    Subjective:   HPI: Kristy [name] is a 81 y.o. female presenting on 05/22/2022 for Back Pain (Low) and Abdominal Pain (LLQ-Nagging Ache)   She reports 1 week of low back pain and left lower quadrant pain. 7-8/10 on pain scale.  Intermittent, lasts longer if on feet.  Urine odor.  No burning with urine.  Nml urine flow.  No fever, no Constipation. No diarrhea.  No nausea.  Drinking water regularly.  Hx of microscopic hematuria. Had work up when 24.. negative.  Referred back to urology last few years ago.. never had  Dr. Loree Fee Has renal artery stenosis.    Using tylenol for pain.. helps   Relevant past medical, surgical, family and social history reviewed and updated as indicated. Interim medical history since our last visit reviewed. Allergies and medications reviewed and updated. Outpatient Medications Prior to Visit  Medication Sig Dispense Refill   aspirin EC 81 MG tablet Take 81 mg by mouth daily. Swallow whole.     atorvastatin (LIPITOR) 20 MG tablet TAKE 1 TABLET BY MOUTH  DAILY 90 tablet 3   Glycerin-Hypromellose-PEG 400 (VISINE DRY EYE) 0.2-0.2-1 % SOLN Apply to eye.     losartan (COZAAR) 50 MG tablet Take 1 tablet (50 mg total) by mouth daily. 90 tablet 3   metoprolol succinate (TOPROL-XL) 100 MG 24 hr tablet TAKE 1 TABLET BY MOUTH  DAILY WITH OR IMMEDIATELY  FOLLOWING A MEAL 90 tablet 0   Naphazoline HCl (CLEAR EYES OP) Place 1 drop into both eyes every 8 (eight) hours as needed (eye redness).     sodium chloride (OCEAN) 0.65 % SOLN nasal spray Place 1 spray into both nostrils every 4 (four)  hours as needed for congestion.     tamoxifen (NOLVADEX) 20 MG tablet Take 20 mg by mouth daily.     triamterene-hydrochlorothiazide (MAXZIDE-25) 37.5-25 MG tablet TAKE 1 TABLET BY MOUTH  DAILY 90 tablet 0   valACYclovir (VALTREX) 500 MG tablet Take 1 tablet (500 mg total) by mouth daily. Take daily for suppressive therapy 30 tablet 11   No facility-administered medications prior to visit.     Per HPI unless specifically indicated in ROS section below Review of Systems  Constitutional:  Negative for fatigue and fever.  HENT:  Negative for congestion.   Eyes:  Negative for pain.  Respiratory:  Negative for cough and shortness of breath.   Cardiovascular:  Negative for chest pain, palpitations and leg swelling.  Gastrointestinal:  Negative for abdominal pain.  Genitourinary:  Negative for dysuria and vaginal bleeding.  Musculoskeletal:  Positive for back pain.  Neurological:  Negative for syncope, light-headedness and headaches.  Psychiatric/Behavioral:  Negative for dysphoric mood.    Objective:  BP 124/70   Pulse 66   Temp 98.1 F (36.7 C) (Oral)   Ht '5\' 2"'$  (1.575 m)   Wt 172 lb 3 oz (78.1 kg)   SpO2 99%   BMI 31.49 kg/m  Wt Readings from Last 3 Encounters:  05/22/22 172 lb 3 oz (78.1 kg)  04/19/22 173 lb 7 oz (78.7 kg)  04/11/22 173 lb 6 oz (78.6 kg)      Physical Exam Constitutional:      General: She is not in acute distress.    Appearance: Normal appearance. She is well-developed. She is not ill-appearing or toxic-appearing.  HENT:     Head: Normocephalic.     Right Ear: Hearing, tympanic membrane, ear canal and external ear normal. Tympanic membrane is not erythematous, retracted or bulging.     Left Ear: Hearing, tympanic membrane, ear canal and external ear normal. Tympanic membrane is not erythematous, retracted or bulging.     Nose: No mucosal edema or rhinorrhea.     Right Sinus: No maxillary sinus tenderness or frontal sinus tenderness.     Left Sinus: No  maxillary sinus tenderness or frontal sinus tenderness.     Mouth/Throat:     Pharynx: Uvula midline.  Eyes:     General: Lids are normal. Lids are everted, no foreign bodies appreciated.     Conjunctiva/sclera: Conjunctivae normal.     Pupils: Pupils are equal, round, and reactive to light.  Neck:     Thyroid: No thyroid mass or thyromegaly.     Vascular: No carotid bruit.     Trachea: Trachea normal.  Cardiovascular:     Rate and Rhythm: Normal rate and regular rhythm.     Pulses: Normal pulses.     Heart sounds: Normal heart sounds, S1 normal and S2 normal. No murmur heard.    No friction rub. No gallop.  Pulmonary:     Effort: Pulmonary effort is normal. No tachypnea or respiratory distress.     Breath sounds: Normal breath sounds. No decreased breath sounds, wheezing, rhonchi or rales.  Abdominal:     General: Bowel sounds are normal.     Palpations: Abdomen is soft.     Tenderness: There is no abdominal tenderness.  Musculoskeletal:     Cervical back: Normal range of motion and neck supple.  Skin:    General: Skin is warm and dry.     Findings: No rash.  Neurological:     Mental Status: She is alert.  Psychiatric:        Mood and Affect: Mood is not anxious or depressed.        Speech: Speech normal.        Behavior: Behavior normal. Behavior is cooperative.        Thought Content: Thought content normal.        Judgment: Judgment normal.       Results for orders placed or performed in visit on 05/22/22  POCT Urinalysis Dipstick (Automated)  Result Value Ref Range   Color, UA Yellow    Clarity, UA Hazy    Glucose, UA Negative Negative   Bilirubin, UA Negative    Ketones, UA Negative    Spec Grav, UA 1.010 1.010 - 1.025   Blood, UA Large (3+)    pH, UA 7.0 5.0 - 8.0   Protein, UA Negative Negative   Urobilinogen, UA 0.2 0.2 or 1.0 E.U./dL   Nitrite, UA Negative    Leukocytes, UA Negative Negative     COVID 19 screen:  No recent travel or known exposure to  COVID19 The patient denies respiratory symptoms of COVID 19 at this time. The importance of social distancing was discussed today.   Assessment and Plan  Problem List Items Addressed This Visit     LLQ pain - Primary    Acute Renal stone versus musculoskeletal versus other Can use tylenol for pain.  Evaluate with CT scan.  Go to ER for fever or severe pain.  Keep up with fluids. If no kidney stone ,we will look into repeat referral back to urologist for evaluation of chronic hematuria.      Relevant Orders   POCT Urinalysis Dipstick (Automated) (Completed)   CBC with Differential/Platelet (Completed)   Basic Metabolic Panel (Completed)   CT Abdomen Pelvis Wo Contrast (Completed)   Other microscopic hematuria   Relevant Orders   CBC with Differential/Platelet (Completed)   Basic Metabolic Panel (Completed)   CT Abdomen Pelvis Wo Contrast (Completed)     Eliezer Lofts, MD

## 2022-05-22 NOTE — Patient Instructions (Addendum)
Can use tylenol for pain.   We will set up CT scan as soon as able.  Go to ER for fever or severe pain.  Keep up with fluids. If no kidney stone ,we will look into repeat referral back to urologist for evaluation of chronic hematuria.

## 2022-05-29 ENCOUNTER — Telehealth: Payer: Self-pay

## 2022-05-29 NOTE — Progress Notes (Signed)
    Chronic Care Management Pharmacy Assistant   Name: Kristy [name]  MRN: [id number] DOB: Feb 06, 1941  Reason for Encounter: CCM (General Adherence)   Recent office visits:  05/22/22 Eliezer Lofts, MD LLQ Pain Abnormal Labs: "No anemia and normal kidney function" Ordered: CT Abdomen Pelvis 04/11/22 Eliezer Lofts, MD Annual Exam Stop: D3 2000 50 MCG Start: Aspirin 81 mg 03/13/22 AWV Ordered: DG Bone Density  Recent consult visits:  05/22/22 CT Abdomen Pelvis 04/19/22 Owens Loffler, MD (Family Medicine) Osteoarthritis of right knee Administered: KENALOG-40 injection 40 mg  Hospital visits:  None in previous 6 months  Medications: Outpatient Encounter Medications as of 05/29/2022  Medication Sig Note   aspirin EC 81 MG tablet Take 81 mg by mouth daily. Swallow whole.    atorvastatin (LIPITOR) 20 MG tablet TAKE 1 TABLET BY MOUTH  DAILY    Glycerin-Hypromellose-PEG 400 (VISINE DRY EYE) 0.2-0.2-1 % SOLN Apply to eye.    losartan (COZAAR) 50 MG tablet Take 1 tablet (50 mg total) by mouth daily.    metoprolol succinate (TOPROL-XL) 100 MG 24 hr tablet TAKE 1 TABLET BY MOUTH  DAILY WITH OR IMMEDIATELY  FOLLOWING A MEAL    Naphazoline HCl (CLEAR EYES OP) Place 1 drop into both eyes every 8 (eight) hours as needed (eye redness).    sodium chloride (OCEAN) 0.65 % SOLN nasal spray Place 1 spray into both nostrils every 4 (four) hours as needed for congestion.    tamoxifen (NOLVADEX) 20 MG tablet Take 20 mg by mouth daily.    triamterene-hydrochlorothiazide (MAXZIDE-25) 37.5-25 MG tablet TAKE 1 TABLET BY MOUTH  DAILY    valACYclovir (VALTREX) 500 MG tablet Take 1 tablet (500 mg total) by mouth daily. Take daily for suppressive therapy 02/09/2022: Taking PRN for outbreaks only   No facility-administered encounter medications on file as of 05/29/2022.   Attempted contact with patient 3 times. Unsuccessful outreach. Will atttempt contact next month.  Patient is more than 5 days past due for refill  on the following medications per chart history: Atorvastatin 20 mg  [facility] Medications: Medication Name/mg Last Fill Days Supply Atorvastatin 20 mg  06/22/2021 90 Losartan 50 mg  03/29/2022 100 Fill dates verified with [facility]  Since last visit with CPP, the following interventions have been made.   Stop: D3 2000 50 MCG  Start: Aspirin 81 mg  Care Gaps: Annual wellness visit in last year? Yes 03/13/22 Most Recent BP reading: 124/70 on 05/22/2022  Upcoming appointments: Vascular Surgery appointment on 06/15/2022 CCM appointment on 10/10/2022  Charlene Brooke, CPP notified  Marijean Niemann, [place] Assistant [phone]

## 2022-06-12 ENCOUNTER — Telehealth: Payer: Self-pay | Admitting: Family Medicine

## 2022-06-12 ENCOUNTER — Telehealth (INDEPENDENT_AMBULATORY_CARE_PROVIDER_SITE_OTHER): Payer: Self-pay

## 2022-06-12 NOTE — Telephone Encounter (Signed)
Patient called and stated that she having left ear pain. Blood pressure readings while on vacation where 180/75 and 140/80 and the ringing started last week while on vacation. Patient was sent to triaged.

## 2022-06-12 NOTE — Telephone Encounter (Signed)
Patient called in wanting to know if Dr. Delana Meyer could look at her neck as she is having ringing in her ear. I spoke with Dr. Delana Meyer and per him the patient would need to start with her PCP or ENT regarding this as it is not vascular related. I returned the call to the patient and gave her the recommendation from Dr. Delana Meyer.

## 2022-06-13 ENCOUNTER — Other Ambulatory Visit: Payer: Self-pay | Admitting: Family Medicine

## 2022-06-13 NOTE — Progress Notes (Unsigned)
MRN : [id number]  Kristy [name] is a 81 y.o. (1941-05-19) female who presents with chief complaint of check circulation.  History of Present Illness:   The patient presents today for follow-up of renal artery stenosis as well as carotid artery stenosis.  The patient also has a history of fibromuscular dysplasia.  The patient denies any major issues with her blood pressure.  She notes it is slightly elevated today due to being n.p.o. for her studies this morning.  She has not taken all of her blood pressure medications.  She also endorses having some swelling in the ankle areas but otherwise denies claudication-like symptoms.  Patient is also active daily.  She notes that she wears compression socks.  There have been no noted TIA or CVA-like symptoms.  Overall the patient has been doing well.   Today her carotid duplex shows no evidence of stenosis in the bilateral carotid arteries.  The bilateral vertebral arteries demonstrate antegrade flow with normal flow hemodynamics in the bilateral subclavian arteries.   Today renal artery duplex shows no evidence of stenosis in the left renal artery.  The right renal artery has a 1 to 59% stenosis.  The bilateral kidney size is normal.  The resistive index is normal bilaterally.  At the cortical thickness is normal bilaterally.  No outpatient medications have been marked as taking for the 06/15/22 encounter (Appointment) with Delana Meyer, Dolores Lory, MD.    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   Constipation    uses stool softener PRN- uses once a week to once every 2 weeks     Dyspnea    Fibromuscular dysplasia ([place])    Gallbladder disease    GERD (gastroesophageal reflux disease)    Hyperlipidemia    Hypertension    For 16 years; urinary catecholamines within normal limits 12/09; renal Doppler ultrasound showed no evidence for renal artery stenosis   Normal echocardiogram 02/2009   LVEF 65%; no regional wall motion abnormalities; normal RV size and function; pulmonic valve had increased gradient across w/ peak gradient of about 36 mmHg (range of moderate pulmonic stenosis) followup showed normal valve   Papilloma of breast    right   Personal history of radiation therapy    Tinnitus of left ear    Tubular adenoma of colon 10/2013    Past Surgical History:  Procedure Laterality Date   ANGIOPLASTY     2012   BREAST BIOPSY Right 2006   benign   BREAST BIOPSY Right 11/09/2020   BREAST BIOPSY Right 11/23/2020   BREAST BIOPSY Right 11/29/2020   x2   BREAST EXCISIONAL BIOPSY Right 07/2017   benign   BREAST LUMPECTOMY Right 12/16/2020   BREAST LUMPECTOMY WITH RADIOACTIVE SEED LOCALIZATION Right 08/14/2017   Procedure: RIGHT BREAST LUMPECTOMY WITH RADIOACTIVE SEED LOCALIZATION;  Surgeon: [name], MD;  Location: [facility];  Service: General;  Laterality: Right;   BREAST LUMPECTOMY WITH RADIOACTIVE SEED LOCALIZATION Right 12/16/2020   Procedure: RADIOACTIVE SEED  GUIDED TIMES 3 RIGHT BREAST LUMPECTOMY;  Surgeon: [name], MD;  Location: [facility];  Service: General;  Laterality: Right;   [place]  09/2006   Neg   CHOLECYSTECTOMY N/A 09/22/2021   Procedure: LAPAROSCOPIC CHOLECYSTECTOMY WITH  INTRAOPERATIVE CHOLANGIOGRAM;  Surgeon: [name], MD;  Location: [facility];  Service: General;  Laterality: N/A;   COLONOSCOPY     KNEE CARTILAGE SURGERY     right knee   POLYPECTOMY     RE-EXCISION OF BREAST LUMPECTOMY Right 01/11/2021   Procedure: RE-EXCISION OF RIGHT BREAST LUMPECTOMY;  Surgeon: [name], MD;   Location: [facility];  Service: General;  Laterality: Right;   TOTAL ABDOMINAL HYSTERECTOMY     no cervix   UPPER GASTROINTESTINAL ENDOSCOPY      Social History Social History   Tobacco Use   Smoking status: Former    Types: Cigarettes    Quit date: 11/20/1982    Years since quitting: 39.5   Smokeless tobacco: Never   Tobacco comments:    15 pack year history  Vaping Use   Vaping Use: Never used  Substance Use Topics   Alcohol use: No   Drug use: No    Family History Family History  Problem Relation Age of Onset   Prostate cancer Father    Lung cancer Father    Colon cancer Maternal Uncle    Colon cancer Paternal Uncle    Colon polyps Sister    Coronary artery disease Paternal Grandmother    Goiter Other        ?   Esophageal cancer Neg Hx    Stomach cancer Neg Hx    Rectal cancer Neg Hx    Bladder Cancer Neg Hx    Kidney cancer Neg Hx     Allergies  Allergen Reactions   Vibramycin [Doxycycline]     Many years ago - "made me feel funny"     REVIEW OF SYSTEMS (Negative unless checked)  Constitutional: '[]'$ Weight loss  '[]'$ Fever  '[]'$ Chills Cardiac: '[]'$ Chest pain   '[]'$ Chest pressure   '[]'$ Palpitations   '[]'$ Shortness of breath when laying flat   '[]'$ Shortness of breath with exertion. Vascular:  '[x]'$ Pain in legs with walking   '[]'$ Pain in legs at rest  '[]'$ History of DVT   '[]'$ Phlebitis   '[]'$ Swelling in legs   '[]'$ Varicose veins   '[]'$ Non-healing ulcers Pulmonary:   '[]'$ Uses home oxygen   '[]'$ Productive cough   '[]'$ Hemoptysis   '[]'$ Wheeze  '[]'$ COPD   '[]'$ Asthma Neurologic:  '[]'$ Dizziness   '[]'$ Seizures   '[]'$ History of stroke   '[]'$ History of TIA  '[]'$ Aphasia   '[]'$ Vissual changes   '[]'$ Weakness or numbness in arm   '[]'$ Weakness or numbness in leg Musculoskeletal:   '[]'$ Joint swelling   '[]'$ Joint pain   '[]'$ Low back pain Hematologic:  '[]'$ Easy bruising  '[]'$ Easy bleeding   '[]'$ Hypercoagulable state   '[]'$ Anemic Gastrointestinal:  '[]'$ Diarrhea   '[]'$ Vomiting  '[x]'$ Gastroesophageal reflux/heartburn   '[]'$ Difficulty  swallowing. Genitourinary:  '[]'$ Chronic kidney disease   '[]'$ Difficult urination  '[]'$ Frequent urination   '[]'$ Blood in urine Skin:  '[]'$ Rashes   '[]'$ Ulcers  Psychological:  '[]'$ History of anxiety   '[]'$  History of major depression.  Physical Examination  There were no vitals filed for this visit. There is no height or weight on file to calculate BMI. Gen: WD/WN, NAD Head: [place]/AT, No temporalis wasting.  Ear/Nose/Throat: Hearing grossly intact, nares w/o erythema or drainage Eyes: PER, EOMI, sclera nonicteric.  Neck: Supple, no masses.  No bruit or JVD.  Pulmonary:  Good air movement, no audible wheezing, no use of accessory muscles.  Cardiac: RRR, normal S1, S2, no Murmurs. Vascular:  mild trophic changes, no open wounds Vessel Right Left  Radial Palpable Palpable  PT Not Palpable Not Palpable  DP Not Palpable Not Palpable  Gastrointestinal: soft, non-distended. No guarding/no peritoneal signs.  Musculoskeletal: M/S 5/5 throughout.  No visible deformity.  Neurologic: CN 2-12 intact. Pain and light touch intact in extremities.  Symmetrical.  Speech is fluent. Motor exam as listed above. Psychiatric: Judgment intact, Mood & affect appropriate for pt's clinical situation. Dermatologic: No rashes or ulcers noted.  No changes consistent with cellulitis.   CBC Lab Results  Component Value Date   WBC 5.0 05/22/2022   HGB 13.1 05/22/2022   HCT 38.7 05/22/2022   MCV 98.1 05/22/2022   PLT 281.0 05/22/2022    BMET    Component Value Date/Time   NA 138 05/22/2022 1251   K 3.8 05/22/2022 1251   CL 102 05/22/2022 1251   CO2 25 05/22/2022 1251   GLUCOSE 95 05/22/2022 1251   BUN 13 05/22/2022 1251   CREATININE 0.79 05/22/2022 1251   CALCIUM 9.9 05/22/2022 1251   GFRNONAA >60 09/15/2021 1319   GFRAA >60 08/09/2017 1300   CrCl cannot be calculated (Patient's most recent lab result is older than the maximum 21 days allowed.).  COAG No results found for: "INR", "PROTIME"  Radiology CT Abdomen  Pelvis Wo Contrast  Result Date: 05/22/2022 CLINICAL DATA:  Abdominal/flank pain, hematuria (Ped 0-17y) EXAM: CT ABDOMEN AND PELVIS WITHOUT CONTRAST TECHNIQUE: Multidetector CT imaging of the abdomen and pelvis was performed following the standard protocol without IV contrast. RADIATION DOSE REDUCTION: This exam was performed according to the departmental dose-optimization program which includes automated exposure control, adjustment of the mA and/or kV according to patient size and/or use of iterative reconstruction technique. COMPARISON:  None Available. FINDINGS: Lower chest: No acute abnormality. Hepatobiliary: No focal liver abnormality is seen. Status post cholecystectomy. Mild dilation of common bile duct, probably related to post cholecystectomy state. Pancreas: Unremarkable. No pancreatic ductal dilatation or surrounding inflammatory changes. Spleen: Normal in size without focal abnormality. Adrenals/Urinary Tract: Adrenal glands are unremarkable. Kidneys are normal, without hydronephrosis. Multiple low-attenuation renal cysts. Small nonobstructing left renal calculus. No definite ureteric calculi. Areas of calcification along the courses of the ureters and in the anatomic pelvis are favored to be an adjacent veins. Bladder is unremarkable. Stomach/Bowel: Stomach is within normal limits. Appendix appears normal. No evidence of bowel wall thickening, distention, or inflammatory changes. Diverticulosis. Vascular/Lymphatic: Aortic atherosclerosis. No enlarged abdominal or pelvic lymph nodes. Reproductive: Approximately 2.7 cm right adnexal cyst. No follow-up imaging recommended. Note: This recommendation does not apply to premenarchal patients and to those with increased risk (genetic, family history, elevated tumor markers or other high-risk factors) of ovarian cancer. Reference: JACR 2020 Feb; 17(2):248-254. Multiple calcified fibroids. Other: No abdominal wall hernia or abnormality. No abdominopelvic  ascites. Musculoskeletal: No fracture is seen. Lower lumbar facet arthropathy. IMPRESSION: 1. No definite ureteric calculi identified, although many calcific phleboliths limits assessment. No evidence of hydronephrosis. 2. Nonobstructing left interpolar renal calculus. 3. Diverticulosis. Electronically Signed   By: Margaretha Sheffield M.D.   On: 05/22/2022 14:03     Assessment/Plan There are no diagnoses linked to this encounter.   Hortencia Pilar, MD  06/13/2022 9:48 AM

## 2022-06-13 NOTE — Telephone Encounter (Signed)
I spoke with pt; who said for years she has had ringing in her ears on and off but last wk was on vacation and most of wk pt had ringing in lt ear and elevated BP on and off. Now BP 149/69 but pt has not taken meds this morning. Pt takes 3 BP meds. Pt said no H/A,dizziness, CP.SOB or vision changes and no ringing in ear now.pt tried to get appt with ENT but was advised could be 3 months before available appt. Pt scheduled appt with Dr Diona Browner on 06/16/22 at 12 noon (first available appt with Dr Diona Browner and pt offered other appts with different providers but pt wants to see DR Diona Browner.) UC & ED precautions given and pt voiced understanding. Sending note to Dr Diona Browner and Butch Penny CMA.

## 2022-06-13 NOTE — Telephone Encounter (Signed)
[place] Day - Client TELEPHONE ADVICE RECORD AccessNurse Patient Name: Kristy [name] [facility] Gender: Female DOB: 24-May-1941 Age: 81 Y 13 D Return Phone Number: [phone] (Primary) Address: [address]/ State/ Zip: [place]  [address] Client [facility] Day - Client Client Site [place] - Day Provider Eliezer Lofts - MD Contact Type Call Who Is Calling Patient / Member / Family / Caregiver Call Type Triage / Clinical Relationship To Patient Self Return Phone Number [phone] (Primary) Chief Complaint Blood Pressure Low Reason for Call Symptomatic / Request for Health Information Initial Comment Patient's blood pressure was fluctuating between 180/75 and then 140/80. There was ringing in her left ear. It's 110/72. Translation No Nurse Assessment Nurse: Roselyn Reef, RN, [place] Date/Time (Eastern Time): 06/12/2022 3:52:08 PM Confirm and document reason for call. If symptomatic, describe symptoms. ---Caller states she was on vacation last week and she was having some ringing in her left ear. BP would fluctuate between 491-791 systolic. Today BP 110/72. States the ringing in her ear seems to be more apparent when her BP was higher. Does the patient have any new or worsening symptoms? ---Yes Will a triage be completed? ---Yes Related visit to physician within the last 2 weeks? ---No Does the PT have any chronic conditions? (i.e. diabetes, asthma, this includes High risk factors for pregnancy, etc.) ---Yes List chronic conditions. ---HTN, muscular dysplasia, blockage of right kidney Is this a behavioral health or substance abuse call? ---No Guidelines Guideline Title Affirmed Question Affirmed Notes Nurse Date/Time (Eastern Time) Tinnitus Symptoms only or mainly in 1 ear (unilateral tinnitus) Roselyn Reef, RN, [facility] 06/12/2022 3:58:07 PM Disp. Time Eilene Ghazi Time) Disposition Final  User 06/12/2022 3:29:46 PM Attempt made - message left Rennie Natter, [facility] 06/12/2022 4:04:42 PM SEE PCP WITHIN 3 DAYS Yes Roselyn Reef, RN, Monica PLEASE NOTE: All timestamps contained within this report are represented as [place] Standard Time. CONFIDENTIALTY NOTICE: This fax transmission is intended only for the addressee. It contains information that is legally privileged, confidential or otherwise protected from use or disclosure. If you are not the intended recipient, you are strictly prohibited from reviewing, disclosing, copying using or disseminating any of this information or taking any action in reliance on or regarding this information. If you have received this fax in error, please notify us immediately by telephone so that we can arrange for its return to [place]. Phone: [phone], Toll-Free: [phone], Fax: [phone] Page: 2 of 2 Call Id: [id number] Final Disposition 06/12/2022 4:04:42 PM SEE PCP WITHIN 3 DAYS Yes Roselyn Reef, RN, [place] Disagree/Comply Comply Caller Understands Yes PreDisposition Home Care Care Advice Given Per Guideline SEE PCP WITHIN 3 DAYS: * You need to be seen within 2 or 3 days. AVOID LOUD NOISE: CALL BACK IF: * Earache occurs * You become worse CARE ADVICE given per Tinnitus (Adult) guideline. Referrals REFERRED TO PCP OFFIC

## 2022-06-13 NOTE — Telephone Encounter (Signed)
Noted  

## 2022-06-14 ENCOUNTER — Other Ambulatory Visit (INDEPENDENT_AMBULATORY_CARE_PROVIDER_SITE_OTHER): Payer: Self-pay | Admitting: Nurse Practitioner

## 2022-06-14 DIAGNOSIS — I701 Atherosclerosis of renal artery: Secondary | ICD-10-CM

## 2022-06-15 ENCOUNTER — Encounter (INDEPENDENT_AMBULATORY_CARE_PROVIDER_SITE_OTHER): Payer: Self-pay | Admitting: Vascular Surgery

## 2022-06-15 ENCOUNTER — Ambulatory Visit (INDEPENDENT_AMBULATORY_CARE_PROVIDER_SITE_OTHER): Payer: Medicare Other

## 2022-06-15 ENCOUNTER — Ambulatory Visit (INDEPENDENT_AMBULATORY_CARE_PROVIDER_SITE_OTHER): Payer: Medicare Other | Admitting: Vascular Surgery

## 2022-06-15 VITALS — BP 146/77 | HR 60 | Resp 16 | Wt 174.0 lb

## 2022-06-15 DIAGNOSIS — R600 Localized edema: Secondary | ICD-10-CM

## 2022-06-15 DIAGNOSIS — E782 Mixed hyperlipidemia: Secondary | ICD-10-CM | POA: Diagnosis not present

## 2022-06-15 DIAGNOSIS — I6523 Occlusion and stenosis of bilateral carotid arteries: Secondary | ICD-10-CM | POA: Diagnosis not present

## 2022-06-15 DIAGNOSIS — R609 Edema, unspecified: Secondary | ICD-10-CM

## 2022-06-15 DIAGNOSIS — I701 Atherosclerosis of renal artery: Secondary | ICD-10-CM | POA: Diagnosis not present

## 2022-06-15 DIAGNOSIS — I10 Essential (primary) hypertension: Secondary | ICD-10-CM | POA: Diagnosis not present

## 2022-06-16 ENCOUNTER — Ambulatory Visit: Payer: Medicare Other | Admitting: Family Medicine

## 2022-06-20 ENCOUNTER — Other Ambulatory Visit: Payer: Self-pay | Admitting: Family Medicine

## 2022-06-21 DIAGNOSIS — R1032 Left lower quadrant pain: Secondary | ICD-10-CM | POA: Insufficient documentation

## 2022-06-21 NOTE — Assessment & Plan Note (Signed)
Acute Renal stone versus musculoskeletal versus other Can use tylenol for pain.  Evaluate with CT scan.  Go to ER for fever or severe pain.  Keep up with fluids. If no kidney stone ,we will look into repeat referral back to urologist for evaluation of chronic hematuria.

## 2022-06-23 ENCOUNTER — Other Ambulatory Visit: Payer: Self-pay | Admitting: Family Medicine

## 2022-06-23 ENCOUNTER — Telehealth: Payer: Self-pay

## 2022-06-23 NOTE — Progress Notes (Signed)
Chronic Care Management Pharmacy Assistant   Name: Kristy [name]  MRN: [id number] DOB: 06-Dec-1940  Reason for Encounter: CCM (General Adherence)  Recent office visits:  05/22/22 Eliezer Lofts, MD LLQ Pain Abnormal Labs: "No anemia and normal kidney function" Ordered: CT Abdomen Pelvis 04/11/22 Eliezer Lofts, MD Annual Exam Stop: D3 2000 50 MCG Start: Aspirin 81 mg 03/13/22 AWV Ordered: DG Bone Density   Recent consult visits:  06/15/22 Hortencia Pilar, MD (Vascular Surgery) Renal artery stenosis No med changes FU 1 year 06/15/22 VAS US RENAL ARTERY DUPLEX 05/22/22 CT Abdomen Pelvis 04/19/22 Owens Loffler, MD (Family Medicine) Osteoarthritis of right knee Administered: KENALOG-40 injection 40 mg   Hospital visits:  None in previous 6 months  Medications: Outpatient Encounter Medications as of 06/23/2022  Medication Sig Note   aspirin EC 81 MG tablet Take 81 mg by mouth daily. Swallow whole.    atorvastatin (LIPITOR) 20 MG tablet TAKE 1 TABLET BY MOUTH  DAILY    Glycerin-Hypromellose-PEG 400 (VISINE DRY EYE) 0.2-0.2-1 % SOLN Apply to eye.    losartan (COZAAR) 50 MG tablet Take 1 tablet (50 mg total) by mouth daily.    metoprolol succinate (TOPROL-XL) 100 MG 24 hr tablet TAKE 1 TABLET BY MOUTH DAILY  WITH OR IMMEDIATELY FOLLOWING A  MEAL    Naphazoline HCl (CLEAR EYES OP) Place 1 drop into both eyes every 8 (eight) hours as needed (eye redness).    sodium chloride (OCEAN) 0.65 % SOLN nasal spray Place 1 spray into both nostrils every 4 (four) hours as needed for congestion.    tamoxifen (NOLVADEX) 20 MG tablet Take 20 mg by mouth daily.    triamterene-hydrochlorothiazide (MAXZIDE-25) 37.5-25 MG tablet TAKE 1 TABLET BY MOUTH DAILY    valACYclovir (VALTREX) 500 MG tablet Take 1 tablet (500 mg total) by mouth daily. Take daily for suppressive therapy 02/09/2022: Taking PRN for outbreaks only   No facility-administered encounter medications on file as of 06/23/2022.    Contacted  Kristy [name] on 06/27/2022 for general disease state and medication adherence call.   Patient is not more than 5 days past due for refill on the following medications per chart history:  [facility] Medications: Medication Name/mg Last Fill Days Supply Atorvastatin 20 mg                  06/22/2021      90 Losartan 50 mg                       03/29/2022     100 Fill dates verified with [facility]   What concerns do you have about your medications? Please see note below  The patient reports the following side effects with their medications. Please see note below  How often do you forget or accidentally miss a dose? Never  Do you use a pillbox? Yes  Are you having any problems getting your medications from your pharmacy? No  Has the cost of your medications been a concern? No  Since last visit with CPP, no interventions have been made.   The patient has not had an ED visit since last contact.   The patient reports the following problems with their health. Please see note below  Patient denies concerns or questions for Charlene Brooke, PharmD at this time.   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 Patient finished radiation in April 2022. Patient  stated she is having side effects that include night sweats. Her eyes are watering and feel like they have a film on them. Her right eye especially and states her vision is hard to focus. Her eye doctor told her she has a cataract forming, but she does not know if that is what is happening. The doctor told her to use Refresh which she does. The issues comes and goes. Patient also stated that her joints are hurting really bad and hurt constantly. Patient takes Tylenol every other day as she does not like to take medication. Patient suffers from fatigue and finds herself needing to rest some days. She has talked with her doctor about this previously and they don't want to her to stop  the medication. She is going to speak with Dr. Chryl Heck with Oncology at her appointment on 12/13 again. Patient has a mammogram and Dexa on 11/27 in which she is hoping the cancer is gone. If cancer is gone she is gong to ask to come off the medication as she can not tolerate it any longer. Patient also has an appointment on 08/14 to discuss a knee replacement which she is hoping to have completed this winter.   Care Gaps: Annual wellness visit in last year? Yes 03/13/22 Most Recent BP reading: 146/77 on 06/15/2022   Upcoming appointments: Knee replacement appointment on 07/03/22 CCM appointment on 10/10/2022 Mammogram and Dexa appointment on 10/16/22 Dr. Chryl Heck (Oncology) appointment on 11/01/22   Charlene Brooke, CPP notified   Marijean Niemann, [place] Assistant [phone]

## 2022-07-03 DIAGNOSIS — M170 Bilateral primary osteoarthritis of knee: Secondary | ICD-10-CM | POA: Diagnosis not present

## 2022-07-05 NOTE — Telephone Encounter (Addendum)
Pharmacist assistant spoke with patient and she is reporting side effects with tamoxifen including night sweats, she wants to come off of medication "if cancer is gone" after upcoming mammogram in November as she feels she just cannot tolerate it much longer.   Patient was encouraged to contact her oncologist regarding these concerns, however she reports she has contacted the office before and discussed side effects and was told she must continue tamoxifen - I do not see any documentation of this discussion, and pt is not the best historian so will forward patient's complaints to her oncologist in case changes can be made.  Her next oncology appt is 11/01/22 with Dr Chryl Heck. She previously saw Dr Jana Hakim.

## 2022-07-06 ENCOUNTER — Telehealth: Payer: Self-pay | Admitting: *Deleted

## 2022-07-06 ENCOUNTER — Telehealth: Payer: Medicare Other

## 2022-07-06 DIAGNOSIS — M170 Bilateral primary osteoarthritis of knee: Secondary | ICD-10-CM | POA: Diagnosis not present

## 2022-07-06 NOTE — Telephone Encounter (Signed)
Opened in error

## 2022-07-06 NOTE — Telephone Encounter (Addendum)
Contacted patient per Dr. Chryl Heck: "Please give her a call and talk to her about issues with tamoxifen. If needed, we can bring her in for a visit"   Ms. Kristy [name] states she talked with a nurse in the office earlier this week. She is seeing surgeon today to schedule knee surgery in the near future. She stated the nurse instructed her to stop Tamoxifen 14 days prior to knee surgery and she will do that.  Patient is currently experiencing joint pains, intermittent fatigue and lower back pain. Offered patient appointment for evaluation earlier than December 2023. She states she is hoping that once knee surgery is complete, back pain will get better.  At this time, Ms. Kristy [name] said she will have mammogram and dexa scan as scheduled in November and keep her appt in December to see Dr. Chryl Heck.  Encouraged her to contact this office for any questions or concerns or if she would like a sooner appt.  She verbalized understanding.  Message forwarded to providers and support staff

## 2022-07-10 ENCOUNTER — Encounter: Payer: Self-pay | Admitting: Internal Medicine

## 2022-07-10 ENCOUNTER — Ambulatory Visit: Payer: Medicare Other | Admitting: Internal Medicine

## 2022-07-10 VITALS — BP 159/83 | HR 77 | Temp 98.0°F | Resp 16 | Ht 62.0 in | Wt 175.0 lb

## 2022-07-10 DIAGNOSIS — I10 Essential (primary) hypertension: Secondary | ICD-10-CM | POA: Diagnosis not present

## 2022-07-10 DIAGNOSIS — E782 Mixed hyperlipidemia: Secondary | ICD-10-CM | POA: Diagnosis not present

## 2022-07-10 DIAGNOSIS — Z01818 Encounter for other preprocedural examination: Secondary | ICD-10-CM | POA: Insufficient documentation

## 2022-07-10 MED ORDER — LOSARTAN POTASSIUM 100 MG PO TABS: 100.0000 mg | ORAL_TABLET | Freq: Every day | ORAL | 2 refills | Status: DC

## 2022-07-10 NOTE — Progress Notes (Signed)
Primary Physician/Referring:  Jinny Sanders, MD  Patient ID: Kristy [name], female    DOB: 11-14-1941, 81 y.o.   MRN: [id number]  Chief Complaint  Patient presents with   Medical Clearance    RT knee replacement   New Patient (Initial Visit)   HPI:    Kristy [name]  is a 81 y.o. female with past medical history significant for HTN, HLD, and arthritis who is here to establish care with cardiology. She is going to have a TKA soon and the surgeons have requested pre-op evaluation by cardiology prior to the procedure. She had an echo done last year and it was completely normal. She does not have any trouble doing everything around the house, including moving furniture on her own. Today, she denies chest pain, shortness of breath, palpitations, diaphoresis, syncope, edema, PND, orthopnea.   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   Constipation    uses stool softener PRN- uses once a week to once every 2 weeks    Dyspnea    Fibromuscular dysplasia (H[facility])    Gallbladder disease    GERD (gastroesophageal reflux disease)    Hyperlipidemia    Hypertension    For 16 years; urinary catecholamines within normal limits 12/09; renal Doppler ultrasound showed no evidence for renal artery stenosis   Normal echocardiogram 02/2009   LVEF 65%; no regional wall motion abnormalities; normal RV size and function; pulmonic valve had increased gradient across w/ peak gradient of about 36 mmHg (range of moderate pulmonic stenosis) followup showed normal valve   Papilloma of breast    right   Personal history of radiation therapy    Tinnitus  of left ear    Tubular adenoma of colon 10/2013   Past Surgical History:  Procedure Laterality Date   ANGIOPLASTY     2012   BREAST BIOPSY Right 2006   benign   BREAST BIOPSY Right 11/09/2020   BREAST BIOPSY Right 11/23/2020   BREAST BIOPSY Right 11/29/2020   x2   BREAST EXCISIONAL BIOPSY Right 07/2017   benign   BREAST LUMPECTOMY Right 12/16/2020   BREAST LUMPECTOMY WITH RADIOACTIVE SEED LOCALIZATION Right 08/14/2017   Procedure: RIGHT BREAST LUMPECTOMY WITH RADIOACTIVE SEED LOCALIZATION;  Surgeon: [name], MD;  Location: [facility];  Service: General;  Laterality: Right;   BREAST LUMPECTOMY WITH RADIOACTIVE SEED LOCALIZATION Right 12/16/2020   Procedure: RADIOACTIVE SEED GUIDED TIMES 3 RIGHT BREAST LUMPECTOMY;  Surgeon: [name], MD;  Location: [facility];  Service: General;  Laterality: Right;   Cardiolyte  09/2006   Neg   CHOLECYSTECTOMY N/A 09/22/2021   Procedure: LAPAROSCOPIC CHOLECYSTECTOMY WITH  INTRAOPERATIVE CHOLANGIOGRAM;  Surgeon: [name], MD;  Location: [facility];  Service: General;  Laterality: N/A;   COLONOSCOPY     KNEE CARTILAGE SURGERY     right knee   POLYPECTOMY     RE-EXCISION OF BREAST LUMPECTOMY Right 01/11/2021   Procedure: RE-EXCISION OF RIGHT BREAST LUMPECTOMY;  Surgeon: [name], MD;  Location: [facility];  Service: General;  Laterality:  Right;   TOTAL ABDOMINAL HYSTERECTOMY     no cervix   UPPER GASTROINTESTINAL ENDOSCOPY     Family History  Problem Relation Age of Onset   Prostate cancer Father    Lung cancer Father    Colon cancer Maternal Uncle    Colon cancer Paternal Uncle    Colon polyps Sister    Coronary artery disease Paternal Grandmother    Goiter Other        ?   Esophageal cancer Neg Hx    Stomach cancer Neg Hx    Rectal cancer Neg Hx    Bladder Cancer Neg Hx    Kidney cancer Neg Hx     Social History   Tobacco Use   Smoking status: Former    Packs/day:  1.00    Years: 15.00    Total pack years: 15.00    Types: Cigarettes    Quit date: 11/20/1982    Years since quitting: 39.6   Smokeless tobacco: Never   Tobacco comments:    15 pack year history  Substance Use Topics   Alcohol use: No   Marital Status: Widowed  ROS  Review of Systems  Cardiovascular: Negative.   Respiratory: Negative.    Musculoskeletal:  Positive for arthritis, joint pain and joint swelling.  Gastrointestinal: Negative.   Objective  Blood pressure (!) 159/83, pulse 77, temperature 98 F (36.7 C), resp. rate 16, height '5\' 2"'$  (1.575 m), weight 175 lb (79.4 kg), SpO2 99 %. Body mass index is 32.01 kg/m.     07/10/2022    2:58 PM 06/15/2022   10:32 AM 05/22/2022   11:49 AM  Vitals with BMI  Height '5\' 2"'$   '5\' 2"'$   Weight 175 lbs 174 lbs 172 lbs 3 oz  BMI 32  85.46  Systolic 270 350 093  Diastolic 83 77 70  Pulse 77 60 66     Physical Exam Vitals reviewed.  Constitutional:      Appearance: Normal appearance. She is normal weight.  HENT:     Head: Normocephalic and atraumatic.  Neck:     Vascular: No carotid bruit.  Cardiovascular:     Rate and Rhythm: Normal rate and regular rhythm.     Pulses: Normal pulses.     Heart sounds: Normal heart sounds. No murmur heard.    No friction rub. No gallop.  Pulmonary:     Effort: Pulmonary effort is normal.     Breath sounds: Normal breath sounds.  Abdominal:     General: Abdomen is flat. Bowel sounds are normal.     Palpations: Abdomen is soft.  Musculoskeletal:     Right lower leg: No edema.     Left lower leg: No edema.  Skin:    General: Skin is warm and dry.  Neurological:     Mental Status: She is alert.   Medications and allergies   Allergies  Allergen Reactions   Vibramycin [Doxycycline]     Many years ago - "made me feel funny"     Medication list after today's encounter   Current Outpatient Medications:    aspirin EC 81 MG tablet, Take 81 mg by mouth as needed. Swallow whole., Disp: , Rfl:     atorvastatin (LIPITOR) 20 MG tablet, TAKE 1 TABLET BY MOUTH  DAILY, Disp: 90 tablet, Rfl: 3   Glycerin-Hypromellose-PEG 400 (VISINE DRY EYE) 0.2-0.2-1 % SOLN, Apply to eye., Disp: , Rfl:    losartan (COZAAR) 100 MG tablet, Take 1 tablet (100  mg total) by mouth at bedtime., Disp: 30 tablet, Rfl: 2   metoprolol succinate (TOPROL-XL) 100 MG 24 hr tablet, TAKE 1 TABLET BY MOUTH DAILY  WITH OR IMMEDIATELY FOLLOWING A  MEAL, Disp: 90 tablet, Rfl: 3   Naphazoline HCl (CLEAR EYES OP), Place 1 drop into both eyes every 8 (eight) hours as needed (eye redness)., Disp: , Rfl:    sodium chloride (OCEAN) 0.65 % SOLN nasal spray, Place 1 spray into both nostrils every 4 (four) hours as needed for congestion., Disp: , Rfl:    tamoxifen (NOLVADEX) 20 MG tablet, Take 20 mg by mouth daily., Disp: , Rfl:    triamterene-hydrochlorothiazide (MAXZIDE-25) 37.5-25 MG tablet, TAKE 1 TABLET BY MOUTH DAILY, Disp: 100 tablet, Rfl: 3   valACYclovir (VALTREX) 500 MG tablet, Take 1 tablet (500 mg total) by mouth daily. Take daily for suppressive therapy, Disp: 30 tablet, Rfl: 11  Laboratory examination:   Lab Results  Component Value Date   NA 138 05/22/2022   K 3.8 05/22/2022   CO2 25 05/22/2022   GLUCOSE 95 05/22/2022   BUN 13 05/22/2022   CREATININE 0.79 05/22/2022   CALCIUM 9.9 05/22/2022   GFRNONAA >60 09/15/2021       Latest Ref Rng & Units 05/22/2022   12:51 PM 04/04/2022   10:58 AM 09/15/2021    1:19 PM  CMP  Glucose 70 - 99 mg/dL 95  107  98   BUN 6 - 23 mg/dL '13  16  13   '$ Creatinine 0.40 - 1.20 mg/dL 0.79  0.78  0.88   Sodium 135 - 145 mEq/L 138  140  139   Potassium 3.5 - 5.1 mEq/L 3.8  3.4  3.8   Chloride 96 - 112 mEq/L 102  108  105   CO2 19 - 32 mEq/L '25  24  27   '$ Calcium 8.4 - 10.5 mg/dL 9.9  9.3  9.8   Total Protein 6.0 - 8.3 g/dL  6.7  6.9   Total Bilirubin 0.2 - 1.2 mg/dL  0.4  0.4   Alkaline Phos 39 - 117 U/L  30  42   AST 0 - 37 U/L  18  18   ALT 0 - 35 U/L  15  15       Latest Ref  Rng & Units 05/22/2022   12:51 PM 09/15/2021    1:19 PM 12/13/2020   12:31 PM  CBC  WBC 4.0 - 10.5 K/uL 5.0  7.3  8.0   Hemoglobin 12.0 - 15.0 g/dL 13.1  13.1  13.6   Hematocrit 36.0 - 46.0 % 38.7  39.0  41.1   Platelets 150.0 - 400.0 K/uL 281.0  297  359     Lipid Panel Recent Labs    04/04/22 1058  CHOL 99  TRIG 70.0  LDLCALC 39  VLDL 14.0  HDL 46.20  CHOLHDL 2    HEMOGLOBIN A1C Lab Results  Component Value Date   HGBA1C 6.1 04/04/2022   TSH No results for input(s): "TSH" in the last 8760 hours.  External labs:     Radiology:    Cardiac Studies:   No results found for this or any previous visit from the past 1095 days.     No results found for this or any previous visit from the past 1095 days.     EKG:     07/10/22 NSR, non-specific T wave abnl - patient asymptomatic  Assessment     ICD-10-CM   1. Pre-op  evaluation  Z01.818 EKG 12-Lead    2. Essential hypertension  I10     3. Mixed hyperlipidemia  E78.2        Orders Placed This Encounter  Procedures   EKG 12-Lead    Meds ordered this encounter  Medications   losartan (COZAAR) 100 MG tablet    Sig: Take 1 tablet (100 mg total) by mouth at bedtime.    Dispense:  30 tablet    Refill:  2    Medications Discontinued During This Encounter  Medication Reason   losartan (COZAAR) 50 MG tablet      Recommendations:   Kristy [name] is a 81 y.o.  female with HTN and HLD who is here for surgical risk stratification  Patient reaches at least 8+ METS daily based on Duke Activity Status Index (DASI) She is acceptable risk to proceed with intermediate risk TKA  Continue current cardiac medications. Losartan increased to '100mg'$ , can take at nighttime. Prior echo and renal u/s reviewed. EKG completed and reviewed Encourage low-sodium diet, less than 2000 mg daily. Follow-up in 3-6 months or sooner if needed.     Floydene Flock, DO, [facility]  07/10/2022, 3:10 PM Office:  [phone] Pager: [phone]

## 2022-07-14 ENCOUNTER — Ambulatory Visit (INDEPENDENT_AMBULATORY_CARE_PROVIDER_SITE_OTHER): Payer: Medicare Other | Admitting: Family Medicine

## 2022-07-14 ENCOUNTER — Encounter: Payer: Self-pay | Admitting: Family Medicine

## 2022-07-14 VITALS — BP 114/60 | HR 70 | Temp 98.5°F | Ht 62.0 in | Wt 174.1 lb

## 2022-07-14 DIAGNOSIS — Z01818 Encounter for other preprocedural examination: Secondary | ICD-10-CM

## 2022-07-14 DIAGNOSIS — I10 Essential (primary) hypertension: Secondary | ICD-10-CM

## 2022-07-14 NOTE — Progress Notes (Signed)
Patient ID: Kristy [name], female    DOB: [birth year], 81 y.o.   MRN: [id number]  This visit was conducted in person.  BP 114/60   Pulse 70   Temp 98.5 F (36.9 C) (Oral)   Ht '5\' 2"'$  (1.575 m)   Wt 174 lb 2 oz (79 kg)   SpO2 95%   BMI 31.85 kg/m    CC:  Chief Complaint  Patient presents with   Pre-op Exam    Subjective:   HPI: Kristy [name] is a 81 y.o. female with history of hypertension, hyperlipidemia, fibromuscular dysplasia, prediabetes and renal artery stenosis presenting on 07/14/2022 for Pre-op Exam  She is scheduled for upcoming  right total knee arthroplasty, with spinal   She had a office visit on July 10, 2022 for preop evaluation with cardiology.  Note reviewed in detail Normal echocardiogram, patient able to reach at least 8 METS daily. Of note at that appointment losartan increased to 100 mg daily.  Hypertension:  Improved control on losartan 100 mg daily BP Readings from Last 3 Encounters:  07/14/22 114/60  07/10/22 (!) 159/83  06/15/22 (!) 146/77  Using medication without problems or lightheadedness:  none Chest pain with exertion: none Edema:none Short of breath: none Average home BPs: 120/70 Other issues:  Prediabetes Lab Results  Component Value Date   HGBA1C 6.1 04/04/2022     No bleeding noted.  No chronic respiratory issue.  No SOB  Reviewed lab  from 05/22/2022     Relevant past medical, surgical, family and social history reviewed and updated as indicated. Interim medical history since our last visit reviewed. Allergies and medications reviewed and updated. Outpatient Medications Prior to Visit  Medication Sig Dispense Refill   aspirin EC 81 MG tablet Take 81 mg by mouth as needed. Swallow whole.     atorvastatin (LIPITOR) 20 MG tablet TAKE 1 TABLET BY MOUTH  DAILY 90 tablet 3   Glycerin-Hypromellose-PEG 400 (VISINE DRY EYE) 0.2-0.2-1 % SOLN Apply to eye.     losartan (COZAAR) 100 MG tablet Take 1 tablet (100 mg total)  by mouth at bedtime. 30 tablet 2   metoprolol succinate (TOPROL-XL) 100 MG 24 hr tablet TAKE 1 TABLET BY MOUTH DAILY  WITH OR IMMEDIATELY FOLLOWING A  MEAL 90 tablet 3   Naphazoline HCl (CLEAR EYES OP) Place 1 drop into both eyes every 8 (eight) hours as needed (eye redness).     sodium chloride (OCEAN) 0.65 % SOLN nasal spray Place 1 spray into both nostrils every 4 (four) hours as needed for congestion.     tamoxifen (NOLVADEX) 20 MG tablet Take 20 mg by mouth daily.     triamterene-hydrochlorothiazide (MAXZIDE-25) 37.5-25 MG tablet TAKE 1 TABLET BY MOUTH DAILY 100 tablet 3   valACYclovir (VALTREX) 500 MG tablet Take 1 tablet (500 mg total) by mouth daily. Take daily for suppressive therapy 30 tablet 11   No facility-administered medications prior to visit.     Per HPI unless specifically indicated in ROS section below Review of Systems  Constitutional:  Negative for fatigue and fever.  HENT:  Negative for congestion.   Eyes:  Negative for pain.  Respiratory:  Negative for cough and shortness of breath.   Cardiovascular:  Negative for chest pain, palpitations and leg swelling.  Gastrointestinal:  Negative for abdominal pain.  Genitourinary:  Negative for dysuria and vaginal bleeding.  Musculoskeletal:  Negative for back pain.  Neurological:  Negative for syncope, light-headedness and headaches.  Psychiatric/Behavioral:  Negative for dysphoric mood.    Objective:  BP 114/60   Pulse 70   Temp 98.5 F (36.9 C) (Oral)   Ht '5\' 2"'$  (1.575 m)   Wt 174 lb 2 oz (79 kg)   SpO2 95%   BMI 31.85 kg/m   Wt Readings from Last 3 Encounters:  07/14/22 174 lb 2 oz (79 kg)  07/10/22 175 lb (79.4 kg)  06/15/22 174 lb (78.9 kg)      Physical Exam Constitutional:      General: She is not in acute distress.    Appearance: Normal appearance. She is well-developed. She is not ill-appearing or toxic-appearing.  HENT:     Head: Normocephalic.     Right Ear: Hearing, tympanic membrane, ear canal  and external ear normal. Tympanic membrane is not erythematous, retracted or bulging.     Left Ear: Hearing, tympanic membrane, ear canal and external ear normal. Tympanic membrane is not erythematous, retracted or bulging.     Nose: No mucosal edema or rhinorrhea.     Right Sinus: No maxillary sinus tenderness or frontal sinus tenderness.     Left Sinus: No maxillary sinus tenderness or frontal sinus tenderness.     Mouth/Throat:     Pharynx: Uvula midline.  Eyes:     General: Lids are normal. Lids are everted, no foreign bodies appreciated.     Conjunctiva/sclera: Conjunctivae normal.     Pupils: Pupils are equal, round, and reactive to light.  Neck:     Thyroid: No thyroid mass or thyromegaly.     Vascular: No carotid bruit.     Trachea: Trachea normal.  Cardiovascular:     Rate and Rhythm: Normal rate and regular rhythm.     Pulses: Normal pulses.     Heart sounds: Normal heart sounds, S1 normal and S2 normal. No murmur heard.    No friction rub. No gallop.  Pulmonary:     Effort: Pulmonary effort is normal. No tachypnea or respiratory distress.     Breath sounds: Normal breath sounds. No decreased breath sounds, wheezing, rhonchi or rales.  Abdominal:     General: Bowel sounds are normal.     Palpations: Abdomen is soft.     Tenderness: There is no abdominal tenderness.  Musculoskeletal:     Cervical back: Normal range of motion and neck supple.  Skin:    General: Skin is warm and dry.     Findings: No rash.  Neurological:     Mental Status: She is alert.  Psychiatric:        Mood and Affect: Mood is not anxious or depressed.        Speech: Speech normal.        Behavior: Behavior normal. Behavior is cooperative.        Thought Content: Thought content normal.        Judgment: Judgment normal.       Results for orders placed or performed in visit on 05/22/22  CBC with Differential/Platelet  Result Value Ref Range   WBC 5.0 4.0 - 10.5 K/uL   RBC 3.95 3.87 - 5.11  Mil/uL   Hemoglobin 13.1 12.0 - 15.0 g/dL   HCT 38.7 36.0 - 46.0 %   MCV 98.1 78.0 - 100.0 fl   MCHC 33.9 30.0 - 36.0 g/dL   RDW 13.6 11.5 - 15.5 %   Platelets 281.0 150.0 - 400.0 K/uL   Neutrophils Relative % 44.1 43.0 - 77.0 %   Lymphocytes Relative 47.0 (  H) 12.0 - 46.0 %   Monocytes Relative 6.8 3.0 - 12.0 %   Eosinophils Relative 1.7 0.0 - 5.0 %   Basophils Relative 0.4 0.0 - 3.0 %   Neutro Abs 2.2 1.4 - 7.7 K/uL   Lymphs Abs 2.3 0.7 - 4.0 K/uL   Monocytes Absolute 0.3 0.1 - 1.0 K/uL   Eosinophils Absolute 0.1 0.0 - 0.7 K/uL   Basophils Absolute 0.0 0.0 - 0.1 K/uL  Basic Metabolic Panel  Result Value Ref Range   Sodium 138 135 - 145 mEq/L   Potassium 3.8 3.5 - 5.1 mEq/L   Chloride 102 96 - 112 mEq/L   CO2 25 19 - 32 mEq/L   Glucose, Bld 95 70 - 99 mg/dL   BUN 13 6 - 23 mg/dL   Creatinine, Ser 0.79 0.40 - 1.20 mg/dL   GFR 71.37 >60.00 mL/min   Calcium 9.9 8.4 - 10.5 mg/dL  POCT Urinalysis Dipstick (Automated)  Result Value Ref Range   Color, UA Yellow    Clarity, UA Hazy    Glucose, UA Negative Negative   Bilirubin, UA Negative    Ketones, UA Negative    Spec Grav, UA 1.010 1.010 - 1.025   Blood, UA Large (3+)    pH, UA 7.0 5.0 - 8.0   Protein, UA Negative Negative   Urobilinogen, UA 0.2 0.2 or 1.0 E.U./dL   Nitrite, UA Negative    Leukocytes, UA Negative Negative     COVID 19 screen:  No recent travel or known exposure to COVID19 The patient denies respiratory symptoms of COVID 19 at this time. The importance of social distancing was discussed today.   Assessment and Plan Problem List Items Addressed This Visit     Essential hypertension    Stable, chronic.  Continue current medication.   Losartan 100 mg daily      Pre-op evaluation - Primary    Moderate risk orthopedic surgery with spinal anesthesia.  She is low risk for upcoming procedure.  She has had no complications with past surgeries within the past 3 years.      Form completed and faxed to  [facility].    Eliezer Lofts, MD

## 2022-07-14 NOTE — Assessment & Plan Note (Signed)
Stable, chronic.  Continue current medication.   Losartan 100 mg daily

## 2022-07-14 NOTE — Patient Instructions (Signed)
Form will be faxed to [facility].

## 2022-07-14 NOTE — Assessment & Plan Note (Signed)
Moderate risk orthopedic surgery with spinal anesthesia.  She is low risk for upcoming procedure.  She has had no complications with past surgeries within the past 3 years.

## 2022-07-20 ENCOUNTER — Other Ambulatory Visit: Payer: Self-pay | Admitting: *Deleted

## 2022-07-20 MED ORDER — TAMOXIFEN CITRATE 20 MG PO TABS: 20.0000 mg | ORAL_TABLET | Freq: Every day | ORAL | 3 refills | Status: DC

## 2022-08-02 ENCOUNTER — Other Ambulatory Visit: Payer: Self-pay | Admitting: Internal Medicine

## 2022-08-02 ENCOUNTER — Ambulatory Visit: Payer: Medicare Other | Admitting: Family Medicine

## 2022-08-02 ENCOUNTER — Other Ambulatory Visit: Payer: Self-pay | Admitting: Family Medicine

## 2022-08-03 NOTE — H&P (Signed)
KNEE ARTHROPLASTY ADMISSION H&P  Patient ID: Kristy [name] MRN: [id number] DOB/AGE: Dec 12, 1940 81 y.o.  Chief Complaint: right knee pain.  Planned Procedure Date: 08/30/22 Medical Clearance by Dr. Eliezer Lofts   Cardiac Clearance by Dr. Floydene Flock Vascular clearance by Dr. Delana Meyer Oncology clearance by Dr. Benay Pike   HPI: Kristy [name] is a 81 y.o. 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 The patient has a history of pain and functional disability in the right knee due to arthritis and has failed non-surgical conservative treatments for greater than 12 weeks to include NSAID's and/or analgesics, corticosteriod injections, use of assistive devices, and activity modification.  Onset of symptoms was gradual, starting 2 years ago with gradually worsening course since that time. The patient noted prior procedures on the knee to include  arthroscopy and menisectomy on the right knee.  Patient currently rates pain at 7 out of 10 with activity. Patient has night pain, worsening of pain with activity and weight bearing, and pain that interferes with activities of daily living.  Patient has evidence of subchondral sclerosis, periarticular osteophytes, and joint space narrowing by imaging studies.  There is no active infection.  Past Medical History:  Diagnosis Date   Allergy    Arterial fibromuscular dysplasia ([facility]) 11/2007   Left carotid artery; diagnosed by MRI; followed by Dr. 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   Constipation    uses stool softener PRN- uses once a week to once every 2 weeks    Dyspnea    Fibromuscular dysplasia ([facility])    Gallbladder disease    GERD (gastroesophageal reflux disease)    Hyperlipidemia    Hypertension    For 16 years; urinary  catecholamines within normal limits 12/09; renal Doppler ultrasound showed no evidence for renal artery stenosis   Normal echocardiogram 02/2009   LVEF 65%; no regional wall motion abnormalities; normal RV size and function; pulmonic valve had increased gradient across w/ peak gradient of about 36 mmHg (range of moderate pulmonic stenosis) followup showed normal valve   Papilloma of breast    right   Personal history of radiation therapy    Tinnitus of left ear    Tubular adenoma of colon 10/2013   Past Surgical History:  Procedure Laterality Date   ANGIOPLASTY     2012   BREAST BIOPSY Right 2006   benign   BREAST BIOPSY Right 11/09/2020   BREAST BIOPSY Right 11/23/2020   BREAST BIOPSY Right 11/29/2020   x2   BREAST EXCISIONAL BIOPSY Right 07/2017   benign   BREAST LUMPECTOMY Right 12/16/2020   BREAST LUMPECTOMY WITH RADIOACTIVE SEED LOCALIZATION Right 08/14/2017   Procedure: RIGHT BREAST LUMPECTOMY WITH RADIOACTIVE SEED LOCALIZATION;  Surgeon: [name], MD;  Location: [facility];  Service: General;  Laterality: Right;   BREAST LUMPECTOMY WITH RADIOACTIVE SEED LOCALIZATION Right 12/16/2020   Procedure: RADIOACTIVE SEED GUIDED TIMES 3 RIGHT BREAST LUMPECTOMY;  Surgeon: [name], MD;  Location: [facility];  Service: General;  Laterality: Right;   Cardiolyte  09/2006   Neg   CHOLECYSTECTOMY N/A 09/22/2021   Procedure: LAPAROSCOPIC CHOLECYSTECTOMY WITH  INTRAOPERATIVE CHOLANGIOGRAM;  Surgeon: [name], MD;  Location: [facility];  Service: General;  Laterality: N/A;   COLONOSCOPY     KNEE CARTILAGE SURGERY     right knee   POLYPECTOMY     RE-EXCISION OF BREAST LUMPECTOMY Right 01/11/2021   Procedure: RE-EXCISION OF RIGHT BREAST LUMPECTOMY;  Surgeon: [name], MD;  Location: [facility];  Service: General;  Laterality: Right;   TOTAL ABDOMINAL HYSTERECTOMY     no cervix   UPPER GASTROINTESTINAL ENDOSCOPY      Allergies  Allergen Reactions   Vibramycin [Doxycycline]     Many years ago - "made me feel funny"   Prior to Admission medications   Medication Sig Start Date End Date Taking? Authorizing Provider  aspirin EC 81 MG tablet Take 81 mg by mouth as needed. Swallow whole.    [provider]  atorvastatin (LIPITOR) 20 MG tablet TAKE 1 TABLET BY MOUTH  DAILY 05/24/21   Bedsole, Amy E, MD  Glycerin-Hypromellose-PEG 400 (VISINE DRY EYE) 0.2-0.2-1 % SOLN Apply to eye.    [provider]  losartan (COZAAR) 100 MG tablet TAKE 1 TABLET BY MOUTH EVERYDAY AT BEDTIME 08/02/22   Custovic, Collene Mares, DO  metoprolol succinate (TOPROL-XL) 100 MG 24 hr tablet TAKE 1 TABLET BY MOUTH DAILY  WITH OR IMMEDIATELY FOLLOWING A  MEAL 06/13/22   Bedsole, Amy E, MD  Naphazoline HCl (CLEAR EYES OP) Place 1 drop into both eyes every 8 (eight) hours as needed (eye redness).    [provider]  sodium chloride (OCEAN) 0.65 % SOLN nasal spray Place 1 spray into both nostrils every 4 (four) hours as needed for congestion.    [provider]  tamoxifen (NOLVADEX) 20 MG tablet Take 1 tablet (20 mg total) by mouth daily. 07/20/22   Benay Pike, MD  triamterene-hydrochlorothiazide (MAXZIDE-25) 37.5-25 MG tablet TAKE 1 TABLET BY MOUTH DAILY 06/23/22   Bedsole, Amy E, MD  valACYclovir (VALTREX) 500 MG tablet Take 1 tablet (500 mg total) by mouth daily. Take daily for suppressive therapy 05/18/20   Jinny Sanders, MD   Social History   Socioeconomic History   Marital status: Widowed    Spouse name: Not on file   Number of children: 0   Years of education: Not on file   Highest education level: Not on file  Occupational History   Occupation: Scientific laboratory technician: RETIRED    Comment: Retired  Tobacco Use   Smoking status: Former    Packs/day: 1.00    Years: 15.00    Total pack years: 15.00    Types: Cigarettes    Quit date: 11/20/1982    Years since quitting: 39.7   Smokeless  tobacco: Never   Tobacco comments:    15 pack year history  Vaping Use   Vaping Use: Never used  Substance and Sexual Activity   Alcohol use: No   Drug use: No   Sexual activity: Not on file  Other Topics Concern   Not on file  Social History Narrative   Widowed in 2007-spouse died from colon cancer.   Regular exercise at [facility].   Diet consists of fruits and veggies, snacks a lot.       Social Determinants of Health   Financial Resource Strain: Low Risk  (07/05/2021)   Overall Financial Resource Strain (CARDIA)    Difficulty of Paying Living Expenses: Not hard at all  Food Insecurity: No Food Insecurity (03/13/2022)   Hunger Vital Sign    Worried About Running Out of Food in the Last Year: Never  true    Ran Out of Food in the Last Year: Never true  Transportation Needs: No Transportation Needs (03/13/2022)   PRAPARE - Hydrologist (Medical): No    Lack of Transportation (Non-Medical): No  Physical Activity: Sufficiently Active (03/13/2022)   Exercise Vital Sign    Days of Exercise per Week: 5 days    Minutes of Exercise per Session: 50 min  Stress: No Stress Concern Present (03/13/2022)   [place]    Feeling of Stress : Not at all  Social Connections: Moderately Isolated (03/13/2022)   Social Connection and Isolation Panel [NHANES]    Frequency of Communication with Friends and Family: More than three times a week    Frequency of Social Gatherings with Friends and Family: More than three times a week    Attends Religious Services: 1 to 4 times per year    Active Member of [employer] or Organizations: No    Attends Archivist Meetings: Never    Marital Status: Widowed   Family History  Problem Relation Age of Onset   Prostate cancer Father    Lung cancer Father    Colon cancer Maternal Uncle    Colon cancer Paternal Uncle    Colon polyps Sister    Coronary artery disease  Paternal Grandmother    Goiter Other        ?   Esophageal cancer Neg Hx    Stomach cancer Neg Hx    Rectal cancer Neg Hx    Bladder Cancer Neg Hx    Kidney cancer Neg Hx     ROS: Currently denies lightheadedness, dizziness, Fever, chills, CP, SOB.   No personal history of DVT, PE, MI, or CVA. No loose teeth. + partial dentures All other systems have been reviewed and were otherwise currently negative with the exception of those mentioned in the HPI and as above.  Objective: Vitals: Ht: 5'3" Wt: 173 lbs Temp: 98.5 BP: 114/60 Pulse: 70 O2 95% on room air.   Physical Exam: General: Alert, NAD.  Antalgic Gait  HEENT: EOMI, Good Neck Extension  Pulm: No increased work of breathing.  Clear B/L A/P w/o crackle or wheeze.  CV: RRR, No m/g/r appreciated  GI: soft, NT, ND. BS x 4 quadrants Neuro: CN II-XII grossly intact without focal deficit.  Sensation intact distally Skin: No lesions in the area of chief complaint MSK/Surgical Site: + JLT. ROM 10-100 degrees.  Decreased strength in extension and flexion.  +EHL/FHL.  NVI.  Pain and instability to varus and valgus stress.    Imaging Review Plain radiographs demonstrate severe degenerative joint disease of the right knee.   The overall alignment ismild valgus. The bone quality appears to be fair for age and reported activity level.  Preoperative templating of the joint replacement has been completed, documented, and submitted to the Operating Room personnel in order to optimize intra-operative equipment management.  Assessment: OA RIGHT KNEE Active Problems:   * No active hospital problems. *   Plan: Plan for Procedure(s): TOTAL KNEE ARTHROPLASTY  The patient history, physical exam, clinical judgement of the provider and imaging are consistent with end stage degenerative joint disease and total joint arthroplasty is deemed medically necessary. The treatment options including medical management, injection therapy, and arthroplasty  were discussed at length. The risks and benefits of Procedure(s): TOTAL KNEE ARTHROPLASTY were presented and reviewed.  The risks of nonoperative treatment, versus surgical intervention including  but not limited to continued pain, aseptic loosening, stiffness, dislocation/subluxation, infection, bleeding, nerve injury, blood clots, cardiopulmonary complications, morbidity, mortality, among others were discussed. The patient verbalizes understanding and wishes to proceed with the plan.  Patient is being admitted for inpatient treatment for surgery, pain control, PT, prophylactic antibiotics, VTE prophylaxis, progressive ambulation, ADL's and discharge planning. She will spend the night in observation.  Dental prophylaxis discussed and recommended for 2 years postoperatively.  The patient does meet the criteria for TXA which will be used perioperatively.   ASA 81 mg BID will be used postoperatively for DVT prophylaxis in addition to SCDs, and early ambulation. Plan for Tylenol and Oxycodone for pain.   No NSAIDs allowed.  Zofran for nausea and vomiting. Colace for constipation prevention Pharmacy- [facility] The patient is planning to be discharged home with OPPT and into the care of her boyfriend Kristy [name] who can be reached at [phone] Follow up appt 09-14-22 at [facility] Office [phone] 08/03/2022 5:28 PM

## 2022-08-07 ENCOUNTER — Telehealth: Payer: Self-pay | Admitting: Family Medicine

## 2022-08-07 DIAGNOSIS — M5136 Other intervertebral disc degeneration, lumbar region: Secondary | ICD-10-CM

## 2022-08-07 NOTE — Telephone Encounter (Signed)
Spoke with Ms. Geisinger.  Referral was ordered correctly for spinal injection.  Dr. Sharlet Salina has done this for her in the past.  FYI to Dr. Diona Browner.

## 2022-08-07 NOTE — Telephone Encounter (Signed)
Referral made to Dr. Sharlet Salina as requested.  I believe he is a physiatrist/pain doctor so I will place the referral as such.  I was not sure where she was talking about injections but I believe he mainly does the spine so I linked it with her lumbar back pain history.  Please let me know if this is incorrect

## 2022-08-07 NOTE — Telephone Encounter (Signed)
Patient called in asking could a referral be sent over to Dr,. Chasnis at the [facility] clinic for Physical  Orthopathic. She stated that shes having a knees surgery in [place] would like to be able to see them for pain injections 2 to 3 weeks after. She spoke with them and they said they would be able to do the injections,but would need a referral faxed over. Fax# [phone].

## 2022-08-14 ENCOUNTER — Other Ambulatory Visit: Payer: Self-pay | Admitting: Family Medicine

## 2022-08-14 ENCOUNTER — Telehealth: Payer: Self-pay | Admitting: Family Medicine

## 2022-08-14 DIAGNOSIS — M1711 Unilateral primary osteoarthritis, right knee: Secondary | ICD-10-CM | POA: Diagnosis not present

## 2022-08-14 MED ORDER — ATORVASTATIN CALCIUM 20 MG PO TABS: 20.0000 mg | ORAL_TABLET | Freq: Every day | ORAL | 2 refills | Status: DC

## 2022-08-14 NOTE — Telephone Encounter (Signed)
Spoke with Kristy [name].  [facility]  Refills sent as requested.

## 2022-08-14 NOTE — Telephone Encounter (Signed)
Left message for Kristy [name] to return my call.  Need to clarify message.  Not clear on what needs to be done.

## 2022-08-14 NOTE — Telephone Encounter (Signed)
Pt stated she did not want the following meds from [facility] : losartan (COZAAR) 100 MG tablet & atorvastatin (LIPITOR) 20 MG tablet. Due to the color of the meds, all pills were the color white and pt doesn't prefer that. Pharmacy stated they requested the meds & are awaiting approval. Call back # [phone].

## 2022-08-15 DIAGNOSIS — M25552 Pain in left hip: Secondary | ICD-10-CM | POA: Diagnosis not present

## 2022-08-16 ENCOUNTER — Ambulatory Visit: Payer: Medicare Other | Admitting: Family Medicine

## 2022-08-17 NOTE — Progress Notes (Addendum)
Anesthesia Review:  PCP: Eliezer Lofts 07/14/22- preop visit  Cardiologist : Jeraldine Loots 07/10/22- OV  Vascular- Ella Jubilee LOV 06/15/22  Chest x-ray  Carotids-06/09/21  Renal artery duplex- 06/15/22  EKG : 07/10/22  Echo : 07/12/21  Stress test: Cardiac Cath :  Activity level: can do a flight of stairs without difficulty  Sleep Study/ CPAP : none  Fasting Blood Sugar :      / Checks Blood Sugar -- times a day:   Blood Thinner/ Instructions /Last Dose: ASA / Instructions/ Last Dose :  81 mg Aspirin   Tamoxifen- PT was instructed to stop 15 days prior to surgery per pt.

## 2022-08-17 NOTE — Patient Instructions (Signed)
SURGICAL WAITING ROOM VISITATION Patients having surgery or a procedure may have no more than 2 support people in the waiting area - these visitors may rotate.   Children under the age of 40 must have an adult with them who is not the patient. If the patient needs to stay at the hospital during part of their recovery, the visitor guidelines for inpatient rooms apply. Pre-op nurse will coordinate an appropriate time for 1 support person to accompany patient in pre-op.  This support person may not rotate.    Please refer to the [facility] website for the visitor guidelines for Inpatients (after your surgery is over and you are in a regular room).       Your procedure is scheduled on:  08/30/2022    Report to [facility] Main Entrance    Report to admitting at   [phone]   Call this number if you have problems the morning of surgery [phone]   Do not eat food :After Midnight.   After Midnight you may have the following liquids until __ 0815____ AM  DAY OF SURGERY  Water Non-Citrus Juices (without pulp, NO RED) Carbonated Beverages Black Coffee (NO MILK/CREAM OR CREAMERS, sugar ok)  Clear Tea (NO MILK/CREAM OR CREAMERS, sugar ok) regular and decaf                             Plain Jell-O (NO RED)                                           Fruit ices (not with fruit pulp, NO RED)                                     Popsicles (NO RED)                                                               Sports drinks like Gatorade (NO RED)                 The day of surgery:  Drink ONE (1) Pre-Surgery Clear Ensure or G2 at    0815AM  ( have completed by ) the morning of surgery. Drink in one sitting. Do not sip.  This drink was given to you during your hospital  pre-op appointment visit. Nothing else to drink after completing the  Pre-Surgery Clear Ensure or G2.          If you have questions, please contact your surgeon's office.     Oral Hygiene is also important to reduce  your risk of infection.                                    Remember - BRUSH YOUR TEETH THE MORNING OF SURGERY WITH YOUR REGULAR TOOTHPASTE   Do NOT smoke after Midnight   Take these medicines the morning of surgery with A SIP OF WATER:  toprol, tamoxifen   DO NOT TAKE ANY ORAL  DIABETIC MEDICATIONS DAY OF YOUR SURGERY  Bring CPAP mask and tubing day of surgery.                              You may not have any metal on your body including hair pins, jewelry, and body piercing             Do not wear make-up, lotions, powders, perfumes/cologne, or deodorant  Do not wear nail polish including gel and S&S, artificial/acrylic nails, or any other type of covering on natural nails including finger and toenails. If you have artificial nails, gel coating, etc. that needs to be removed by a nail salon please have this removed prior to surgery or surgery may need to be canceled/ delayed if the surgeon/ anesthesia feels like they are unable to be safely monitored.   Do not shave  48 hours prior to surgery.               Men may shave face and neck.   Do not bring valuables to the hospital. [place].   Contacts, dentures or bridgework may not be worn into surgery.   Bring small overnight bag day of surgery.   DO NOT [place]. PHARMACY WILL DISPENSE MEDICATIONS LISTED ON YOUR MEDICATION LIST TO YOU DURING YOUR ADMISSION [place]!    Patients discharged on the day of surgery will not be allowed to drive home.  Someone NEEDS to stay with you for the first 24 hours after anesthesia.   Special Instructions: Bring a copy of your healthcare power of attorney and living will documents the day of surgery if you haven't scanned them before.              Please read over the following fact sheets you were given: IF [place] [phone]   If you received a  COVID test during your pre-op visit  it is requested that you wear a mask when out in public, stay away from anyone that may not be feeling well and notify your surgeon if you develop symptoms. If you test positive for Covid or have been in contact with anyone that has tested positive in the last 10 days please notify you surgeon.     [place] - Preparing for Surgery Before surgery, you can play an important role.  Because skin is not sterile, your skin needs to be as free of germs as possible.  You can reduce the number of germs on your skin by washing with CHG (chlorahexidine gluconate) soap before surgery.  CHG is an antiseptic cleaner which kills germs and bonds with the skin to continue killing germs even after washing. Please DO NOT use if you have an allergy to CHG or antibacterial soaps.  If your skin becomes reddened/irritated stop using the CHG and inform your nurse when you arrive at Short Stay. Do not shave (including legs and underarms) for at least 48 hours prior to the first CHG shower.  You may shave your face/neck. Please follow these instructions carefully:  1.  Shower with CHG Soap the night before surgery and the  morning of Surgery.  2.  If you choose to wash your hair, wash your hair first as usual with your  normal  shampoo.  3.  After you shampoo, rinse your hair and body thoroughly to remove the  shampoo.                           4.  Use CHG as you would any other liquid soap.  You can apply chg directly  to the skin and wash                       Gently with a scrungie or clean washcloth.  5.  Apply the CHG Soap to your body ONLY FROM THE NECK DOWN.   Do not use on face/ open                           Wound or open sores. Avoid contact with eyes, ears mouth and genitals (private parts).                       Wash face,  Genitals (private parts) with your normal soap.             6.  Wash thoroughly, paying special attention to the area where your surgery  will be  performed.  7.  Thoroughly rinse your body with warm water from the neck down.  8.  DO NOT shower/wash with your normal soap after using and rinsing off  the CHG Soap.                9.  Pat yourself dry with a clean towel.            10.  Wear clean pajamas.            11.  Place clean sheets on your bed the night of your first shower and do not  sleep with pets. Day of Surgery : Do not apply any lotions/deodorants the morning of surgery.  Please wear clean clothes to the hospital/surgery center.  FAILURE TO FOLLOW THESE INSTRUCTIONS MAY RESULT IN THE CANCELLATION OF YOUR SURGERY PATIENT SIGNATURE_________________________________  NURSE SIGNATURE__________________________________  ________________________________________________________________________

## 2022-08-21 ENCOUNTER — Encounter (HOSPITAL_COMMUNITY): Payer: Self-pay

## 2022-08-21 ENCOUNTER — Other Ambulatory Visit: Payer: Self-pay

## 2022-08-21 ENCOUNTER — Encounter (HOSPITAL_COMMUNITY)
Admission: RE | Admit: 2022-08-21 | Discharge: 2022-08-21 | Disposition: A | Discharge status: Home / Self Care | Payer: Medicare Other | Source: Ambulatory Visit | Attending: Orthopedic Surgery | Admitting: Orthopedic Surgery

## 2022-08-21 VITALS — BP 147/68 | HR 65 | Temp 99.0°F | Resp 16 | Ht 62.0 in | Wt 167.0 lb

## 2022-08-21 DIAGNOSIS — Z87891 Personal history of nicotine dependence: Secondary | ICD-10-CM | POA: Insufficient documentation

## 2022-08-21 DIAGNOSIS — Z01818 Encounter for other preprocedural examination: Secondary | ICD-10-CM

## 2022-08-21 DIAGNOSIS — N189 Chronic kidney disease, unspecified: Secondary | ICD-10-CM | POA: Insufficient documentation

## 2022-08-21 DIAGNOSIS — Z01812 Encounter for preprocedural laboratory examination: Secondary | ICD-10-CM | POA: Diagnosis not present

## 2022-08-21 DIAGNOSIS — I129 Hypertensive chronic kidney disease with stage 1 through stage 4 chronic kidney disease, or unspecified chronic kidney disease: Secondary | ICD-10-CM | POA: Insufficient documentation

## 2022-08-21 DIAGNOSIS — C50911 Malignant neoplasm of unspecified site of right female breast: Secondary | ICD-10-CM | POA: Insufficient documentation

## 2022-08-21 DIAGNOSIS — M1711 Unilateral primary osteoarthritis, right knee: Secondary | ICD-10-CM | POA: Diagnosis not present

## 2022-08-21 LAB — CBC
HCT: 40.9 % (ref 36.0–46.0)
Hemoglobin: 13.4 g/dL (ref 12.0–15.0)
MCH: 32.8 pg (ref 26.0–34.0)
MCHC: 32.8 g/dL (ref 30.0–36.0)
MCV: 100 fL (ref 80.0–100.0)
Platelets: 337 10*3/uL (ref 150–400)
RBC: 4.09 MIL/uL (ref 3.87–5.11)
RDW: 12.4 % (ref 11.5–15.5)
WBC: 9.5 10*3/uL (ref 4.0–10.5)
nRBC: 0 % (ref 0.0–0.2)

## 2022-08-21 LAB — BASIC METABOLIC PANEL
Anion gap: 7 (ref 5–15)
BUN: 19 mg/dL (ref 8–23)
CO2: 24 mmol/L (ref 22–32)
Calcium: 10.1 mg/dL (ref 8.9–10.3)
Chloride: 106 mmol/L (ref 98–111)
Creatinine, Ser: 1.05 mg/dL — ABNORMAL HIGH (ref 0.44–1.00)
GFR, Estimated: 54 mL/min — ABNORMAL LOW (ref >60)
Glucose, Bld: 95 mg/dL (ref 70–99)
Potassium: 3.7 mmol/L (ref 3.5–5.1)
Sodium: 137 mmol/L (ref 135–145)

## 2022-08-21 LAB — SURGICAL PCR SCREEN
MRSA, PCR: NEGATIVE
Staphylococcus aureus: NEGATIVE

## 2022-08-22 ENCOUNTER — Encounter (HOSPITAL_COMMUNITY): Payer: Self-pay | Admitting: Physician Assistant

## 2022-08-22 NOTE — Progress Notes (Signed)
Anesthesia Chart Review   Case: [id number] Date/Time: 08/30/22 1110   Procedure: TOTAL KNEE ARTHROPLASTY (Right: Knee)   Anesthesia type: Choice   Pre-op diagnosis: OA RIGHT KNEE   Location: [facility] ROOM 09 / [facility]   Surgeons: Willaim Sheng, MD       DISCUSSION:81 y.o. former smoker with h/o HTN, CKD, right breast cancer, right knee OA scheduled for above procedure 08/30/2022 with Dr. Charlies Constable.   Pt seen by PCP 07/14/2022 for preoperative evaluation 07/14/2022. Per OV note, "Moderate risk orthopedic surgery with spinal anesthesia.  She is low risk for upcoming procedure.  She has had no complications with past surgeries within the past 3 years."  Tamoxifen being held 15 days prior to surgery.  VS: BP (!) 147/68   Pulse 65   Temp 37.2 C (Oral)   Resp 16   Ht '5\' 2"'$  (1.575 m)   Wt 75.8 kg   SpO2 100%   BMI 30.54 kg/m   PROVIDERS: Jinny Sanders, MD is PCP    LABS: Labs reviewed: Acceptable for surgery. (all labs ordered are listed, but only abnormal results are displayed)  Labs Reviewed  BASIC METABOLIC PANEL - Abnormal; Notable for the following components:      Result Value   Creatinine, Ser 1.05 (*)    GFR, Estimated 54 (*)    All other components within normal limits  SURGICAL PCR SCREEN  CBC     IMAGES:   EKG:   CV: Echo 07/12/2021 1. Left ventricular ejection fraction, by estimation, is 60 to 65%. The  left ventricle has normal function. The left ventricle has no regional  wall motion abnormalities. Left ventricular diastolic parameters are  consistent with Grade I diastolic  dysfunction (impaired relaxation). The average left ventricular global  longitudinal strain is -18.9 %. The global longitudinal strain is normal.   2. Right ventricular systolic function is normal. The right ventricular  size is normal. Tricuspid regurgitation signal is inadequate for assessing  PA pressure.   3. The mitral valve is normal in structure. No evidence of  mitral valve  regurgitation. No evidence of mitral stenosis.   4. The aortic valve is tricuspid. Aortic valve regurgitation is not  visualized. No aortic stenosis is present.  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   Constipation    uses stool softener PRN- uses once a week to once every 2 weeks    Fibromuscular dysplasia ([place])    Gallbladder disease    Hyperlipidemia    Hypertension    For 16 years; urinary catecholamines within normal limits 12/09; renal Doppler ultrasound showed no evidence for renal artery stenosis   Normal echocardiogram 02/2009   LVEF 65%; no regional wall motion abnormalities; normal RV size and function; pulmonic valve had increased gradient across w/ peak gradient of about 36 mmHg (range of moderate pulmonic stenosis) followup showed normal valve   Papilloma of breast    right   Personal history of radiation therapy    Tinnitus of left ear    Tubular adenoma of colon 10/2013  Past Surgical History:  Procedure Laterality Date   ANGIOPLASTY     2012   BREAST BIOPSY Right 2006   benign   BREAST BIOPSY Right 11/09/2020   BREAST BIOPSY Right 11/23/2020   BREAST BIOPSY Right 11/29/2020   x2   BREAST EXCISIONAL BIOPSY Right 07/2017   benign   BREAST LUMPECTOMY Right 12/16/2020   BREAST LUMPECTOMY WITH RADIOACTIVE SEED LOCALIZATION Right 08/14/2017   Procedure: RIGHT BREAST LUMPECTOMY WITH RADIOACTIVE SEED LOCALIZATION;  Surgeon: [name], MD;  Location: [facility];  Service: General;  Laterality: Right;   BREAST LUMPECTOMY WITH RADIOACTIVE SEED LOCALIZATION Right 12/16/2020    Procedure: RADIOACTIVE SEED GUIDED TIMES 3 RIGHT BREAST LUMPECTOMY;  Surgeon: [name], MD;  Location: [facility];  Service: General;  Laterality: Right;   [place]  09/2006   Neg   CHOLECYSTECTOMY N/A 09/22/2021   Procedure: LAPAROSCOPIC CHOLECYSTECTOMY WITH  INTRAOPERATIVE CHOLANGIOGRAM;  Surgeon: [name], MD;  Location: [facility];  Service: General;  Laterality: N/A;   COLONOSCOPY     KNEE CARTILAGE SURGERY     right knee   POLYPECTOMY     RE-EXCISION OF BREAST LUMPECTOMY Right 01/11/2021   Procedure: RE-EXCISION OF RIGHT BREAST LUMPECTOMY;  Surgeon: [name], MD;  Location: [facility];  Service: General;  Laterality: Right;   TOTAL ABDOMINAL HYSTERECTOMY     no cervix   UPPER GASTROINTESTINAL ENDOSCOPY      MEDICATIONS:  aspirin EC 81 MG tablet   atorvastatin (LIPITOR) 20 MG tablet   losartan (COZAAR) 50 MG tablet   metoprolol succinate (TOPROL-XL) 100 MG 24 hr tablet   Multiple Vitamin (MULTIVITAMIN WITH MINERALS) TABS tablet   Naphazoline HCl (CLEAR EYES OP)   sodium chloride (OCEAN) 0.65 % SOLN nasal spray   tamoxifen (NOLVADEX) 20 MG tablet   triamterene-hydrochlorothiazide (MAXZIDE-25) 37.5-25 MG tablet   valACYclovir (VALTREX) 500 MG tablet   No current facility-administered medications for this encounter.     Konrad Felix Ward, PA-C [facility] Pre-Surgical Testing [phone]

## 2022-08-24 NOTE — Care Plan (Signed)
Ortho Bundle Case Management Note  Patient Details  Name: Kristy [name] MRN: [id number] Date of Birth: 1940-11-28    met with patient in the office for H&P. she will discharge to home with family to assist. rolling walker ordered for home use. HHPT referral to [place] and [place] set up with [place]. discharge instructions discussed and questions answered. appointments confirmed. Patient and MD in agreement with plan. Choice offered                  DME Arranged:  Walker rolling DME Agency:  [employer]  HH Arranged:  PT HH Agency:  [place]  Additional Comments: Please contact me with any questions of if this plan should need to change.  Ladell Heads,  [place] Specialist  [phone] 08/24/2022, 11:45 AM

## 2022-08-30 ENCOUNTER — Encounter (HOSPITAL_COMMUNITY): Admission: RE | Payer: Self-pay | Source: Ambulatory Visit

## 2022-08-30 ENCOUNTER — Ambulatory Visit (HOSPITAL_COMMUNITY): Admission: RE | Admit: 2022-08-30 | Payer: Medicare Other | Source: Ambulatory Visit | Admitting: Orthopedic Surgery

## 2022-08-30 SURGERY — ARTHROPLASTY, KNEE, TOTAL
Anesthesia: Choice | Site: Knee | Laterality: Right

## 2022-09-08 ENCOUNTER — Ambulatory Visit (INDEPENDENT_AMBULATORY_CARE_PROVIDER_SITE_OTHER): Payer: Medicare Other

## 2022-09-08 DIAGNOSIS — Z23 Encounter for immunization: Secondary | ICD-10-CM

## 2022-09-18 ENCOUNTER — Encounter (INDEPENDENT_AMBULATORY_CARE_PROVIDER_SITE_OTHER): Payer: Self-pay

## 2022-09-27 ENCOUNTER — Telehealth: Payer: Self-pay | Admitting: Family Medicine

## 2022-09-27 NOTE — Telephone Encounter (Signed)
Error

## 2022-09-29 ENCOUNTER — Ambulatory Visit: Payer: Medicare Other | Admitting: Family Medicine

## 2022-10-05 ENCOUNTER — Telehealth: Payer: Self-pay

## 2022-10-05 NOTE — Progress Notes (Signed)
    Chronic Care Management Pharmacy Assistant   Name: Kristy [name]  MRN: [id number] DOB: 1941/07/29  Reason for Encounter: CCM (Appointment Reminder)  Medications: Outpatient Encounter Medications as of 10/05/2022  Medication Sig   aspirin EC 81 MG tablet Take 81 mg by mouth daily. Swallow whole.   atorvastatin (LIPITOR) 20 MG tablet Take 1 tablet (20 mg total) by mouth daily.   losartan (COZAAR) 50 MG tablet TAKE 1 TABLET BY MOUTH EVERY DAY   metoprolol succinate (TOPROL-XL) 100 MG 24 hr tablet TAKE 1 TABLET BY MOUTH DAILY  WITH OR IMMEDIATELY FOLLOWING A  MEAL   Multiple Vitamin (MULTIVITAMIN WITH MINERALS) TABS tablet Take 2 tablets by mouth daily.   Naphazoline HCl (CLEAR EYES OP) Place 1 drop into both eyes every 8 (eight) hours as needed (eye redness).   sodium chloride (OCEAN) 0.65 % SOLN nasal spray Place 1 spray into both nostrils every 4 (four) hours as needed for congestion.   tamoxifen (NOLVADEX) 20 MG tablet Take 1 tablet (20 mg total) by mouth daily.   triamterene-hydrochlorothiazide (MAXZIDE-25) 37.5-25 MG tablet TAKE 1 TABLET BY MOUTH DAILY   valACYclovir (VALTREX) 500 MG tablet Take 1 tablet (500 mg total) by mouth daily. Take daily for suppressive therapy (Patient taking differently: Take 500 mg by mouth daily as needed (outbreaks).)   No facility-administered encounter medications on file as of 10/05/2022.   [name] was contacted to remind of upcoming telephone visit with Charlene Brooke on 10/10/2022 at 11:00. Patient was reminded to have any blood glucose and blood pressure readings available for review at appointment.   Message was left reminding patient of appointment.  CCM referral has been placed prior to visit?  No   Star Rating Drugs: Medication:  Last Fill: [facility] Atorvastatin 20 mg 08/28/2022 90 Losartan 50 mg 09/15/2022 [place], CPP notified  Marijean Niemann, [place] Pharmacy Assistant [phone]

## 2022-10-10 ENCOUNTER — Ambulatory Visit: Payer: Medicare Other | Admitting: Pharmacist

## 2022-10-10 DIAGNOSIS — E782 Mixed hyperlipidemia: Secondary | ICD-10-CM

## 2022-10-10 DIAGNOSIS — D0511 Intraductal carcinoma in situ of right breast: Secondary | ICD-10-CM

## 2022-10-10 DIAGNOSIS — I10 Essential (primary) hypertension: Secondary | ICD-10-CM

## 2022-10-10 NOTE — Patient Instructions (Signed)
Visit Information  Phone number for Pharmacist: [phone]   Goals Addressed   None     Care Plan : [place]  Updates made by Charlton Haws, [place] since 10/10/2022 12:00 AM     Problem: Hypertension, Hyperlipidemia, and Osteoporosis, Breast Cancer   Priority: High     Long-Range Goal: Disease Management   Start Date: 07/05/2021  Expected End Date: 02/10/2023  This Visit's Progress: On track  Recent Progress: On track  Priority: High  Note:   Current Barriers:  None identified  Pharmacist Clinical Goal(s):  Patient will contact provider office for questions/concerns as evidenced notation of same in electronic health record through collaboration with PharmD and provider.   Interventions: 1:1 collaboration with Jinny Sanders, MD regarding development and update of comprehensive plan of care as evidenced by provider attestation and co-signature Inter-disciplinary care team collaboration (see longitudinal plan of care) Comprehensive medication review performed; medication list updated in electronic medical record  Hypertension (BP goal <140/90) -Controlled - per clinic readings  -Current home BP readings: 120s/70s -Current treatment: Metoprolol succinate 100 mg daily - Appropriate, Effective, Safe, Accessible Losartan 50 mg daily - Appropriate, Effective, Safe, Accessible Triamterene/HCTZ 37.5-25 mg daily - Appropriate, Effective, Safe, Accessible -Medications previously tried: none reported s -Educated on BP goals and benefits of medications for prevention of heart attack, stroke and kidney damage; -Counseled to monitor BP at home periodically -Recommended to continue current medication  Hyperlipidemia: (LDL goal < 70) -Controlled -  LDL 39 (03/2022) at goal -Current treatment: Atorvastatin 20 mg daily - Appropriate, Effective, Safe, Accessible Aspirin 81 mg daily - Appropriate, Effective, Safe, Accessible -Medications previously tried: none    -Current dietary patterns: she avoids red meats and fried foods, increase vegetables -Current exercise habits: she targets 5000 steps per day, very active -Educated on Cholesterol goals; Importance of limiting foods high in cholesterol; Exercise goal of 150 minutes per week; -Recommended to continue current medication  Breast Cancer -DCIS (Goal: prevent recurrence) -Controlled - although pt is concerned about tamoxifen side effects; she reports occasional hot flashes, currently tolerable; she read about liver and eye issues - provided reassurance about LFTs (normal 08/2021) and she follows regularly with eye doctor -Follows with Dr Magrinat/Dr Malena Peer. Dx 2018, R lumpectomy 07/2017. Mammogram 09/2020 abnormal. R lumpectomy 11/2020. No evidence of recurrence 10/2021. Plan 5 years of Tamoxifen. -Current treatment  Tamoxifen 20 mg daily (started 03/2021) - Appropriate, Effective, Safe, Accessible -Medications previously tried: n/a  -Pt has upcoming mammogram and appt with oncology, plans to discuss switching tamoxifen -Recommended to continue current medication for now  Osteopenia (Goal prevent fractures) -Controlled -Last DEXA Scan: 08/2019   T-Score femoral neck: -2.0  T-Score total hip: -1.5  T-Score lumbar spine: -1.8  10-year probability of major osteoporotic fracture: 6.0%  10-year probability of hip fracture: 1.5% -Patient is not a candidate for pharmacologic treatment -Current treatment  None -Medications previously tried: n/a  -Recommend [phone] units of vitamin D daily. Recommend 1200 mg of calcium daily from dietary and supplemental sources.  Patient Goals/Self-Care Activities Patient will:  - take medications as prescribed -engage in dietary modifications by continue to limit foods high in cholesterol -contact provider office for questions/concerns      Patient verbalizes understanding of instructions and care plan provided today and agrees to view in [place]. Active  MyChart status and patient understanding of how to access instructions and care plan via MyChart confirmed with patient.    Telephone follow up appointment with pharmacy  team member scheduled for: PRN  Charlene Brooke, PharmD, BCACP Clinical Pharmacist [facility] [phone]

## 2022-10-10 NOTE — Progress Notes (Signed)
Chronic Care Management Pharmacy Note  10/10/2022 Name:  Kristy [name] MRN:  [id number] DOB:  February 05, 1941  Summary: CCM F/U visit -Pt endorses compliance with medications -Pt was concerned about tamoxifen side effects - she reports joint pain -Pt recently had to cancel knee surgery due to dental issues - she is working on resolving dental issue in order to reschedule surgery  Recommendations/Changes made from today's visit: -No med changes  Plan: -[facility] will call patient 3 months for general update -Pharmacist follow up PRN -Oncology appt 11/01/22    Subjective: Kristy [name] is an 81 y.o. year old female who is a primary patient of Bedsole, Amy E, MD.  The CCM team was consulted for assistance with disease management and care coordination needs.    Engaged with patient by telephone for follow up visit in response to provider referral for pharmacy case management and/or care coordination services.   Consent to Services:  The patient was given information about Chronic Care Management services, agreed to services, and gave verbal consent prior to initiation of services.  Please see initial visit note for detailed documentation.   Patient Care Team: Jinny Sanders, MD as PCP - General [name], MD as Consulting Physician (General Surgery) Magrinat, Virgie Dad, MD (Inactive) as Consulting Physician (Oncology) Kyung Rudd, MD as Consulting Physician (Radiation Oncology) Ladene Artist, MD as Consulting Physician (Gastroenterology) Mauro Kaufmann, RN as Oncology Nurse Navigator Rockwell Germany, RN as Oncology Nurse Navigator Charlton Haws, [facility] as Pharmacist (Pharmacist)  Recent office visits: 07/14/22 Dr Diona Browner Ov: pre-op eval (TKA).   05/22/22 Eliezer Lofts, MD LLQ Pain Abnormal Labs: "No anemia and normal kidney function" Ordered: CT Abdomen Pelvis 04/11/22 Eliezer Lofts, MD Annual Exam Stop: D3 2000 50 MCG Start: Aspirin 81 mg 03/13/22  AWV Ordered: DG Bone Density  Recent consult visits: 07/10/22 Dr Shellia Carwin (Cardiology): pre-op eval (TKA). Increase losartan to 100 mg (BP 159/83).  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 08/23/21 Dr Brantley Stage (General Surgery): gallbladder calculus.  Hospital visits: 09/22/21 Admission for elective cholecystectomy   Objective:  Lab Results  Component Value Date   CREATININE 1.05 (H) 08/21/2022   BUN 19 08/21/2022   GFR 71.37 05/22/2022   GFRNONAA 54 (L) 08/21/2022   GFRAA >60 08/09/2017   NA 137 08/21/2022   K 3.7 08/21/2022   CALCIUM 10.1 08/21/2022   CO2 24 08/21/2022   GLUCOSE 95 08/21/2022    Lab Results  Component Value Date/Time   HGBA1C 6.1 04/04/2022 10:58 AM   HGBA1C 6.1 03/25/2021 11:59 AM   GFR 71.37 05/22/2022 12:51 PM   GFR 72.53 04/04/2022 10:58 AM    Last diabetic Eye exam: No results found for: "HMDIABEYEEXA"  Last diabetic Foot exam:  Lab Results  Component Value Date/Time   HMDIABFOOTEX Stage 1 HTN Retinopathy Both Eyes 12/07/2014 12:00 AM     Lab Results  Component Value Date   CHOL 99 04/04/2022   HDL 46.20 04/04/2022   LDLCALC 39 04/04/2022   LDLDIRECT 140.3 03/26/2013   TRIG 70.0 04/04/2022   CHOLHDL 2 04/04/2022       Latest Ref Rng & Units 04/04/2022   10:58 AM 09/15/2021    1:19 PM 03/25/2021   11:59 AM  Hepatic Function  Total Protein 6.0 - 8.3 g/dL 6.7  6.9  7.6   Albumin 3.5 - 5.2 g/dL 4.0  3.7  4.4   AST 0 - 37 U/L _0 ALT 0 - 35 U/L _1 Alk Phosphatase 39 - 117 U/L 30  42  68   Total Bilirubin 0.2 - 1.2 mg/dL 0.4  0.4  0.6     Lab Results  Component Value Date/Time   TSH 2.60 05/24/2021 03:34 PM   TSH 2.84 07/02/2020 09:33 AM    FREET4 0.77 04/17/2019 10:09 AM   FREET4 0.64 11/05/2018 11:13 AM       Latest Ref Rng & Units 08/21/2022    1:30 PM 05/22/2022   12:51 PM 09/15/2021    1:19 PM  CBC  WBC 4.0 - 10.5 K/uL 9.5  5.0  7.3   Hemoglobin 12.0 - 15.0 g/dL 13.4  13.1  13.1   Hematocrit 36.0 - 46.0 % 40.9  38.7  39.0   Platelets 150 - 400 K/uL 337  281.0  297     Lab Results  Component Value Date/Time   VD25OH 28.04 (L) 11/18/2019 09:35 AM   VD25OH 26.95 (L) 09/04/2017 11:35 AM    Clinical ASCVD: No  The ASCVD Risk score (Arnett DK, et al., 2019) failed to calculate for the following reasons:   The valid total cholesterol range is 130 to 320 mg/dL       03/13/2022   12:08 PM 03/03/2021   10:37 AM 11/18/2019    2:52 PM  Depression screen PHQ 2/9  Decreased Interest 0 2 0  Down, Depressed, Hopeless 0 2 0  PHQ - 2 Score 0 4 0  Altered sleeping  0 0  Tired, decreased energy  0 0  Change in appetite  0 0  Feeling bad or failure about yourself   0 0  Trouble concentrating  0 0  Moving slowly or fidgety/restless  0 0  Suicidal thoughts  0 0  PHQ-9 Score  4 0  Difficult doing work/chores  Somewhat difficult Not difficult at all     Social History   Tobacco Use  Smoking Status Former   Packs/day: 1.00   Years: 15.00   Total pack years: 15.00   Types: Cigarettes   Quit date: 11/20/1982   Years since quitting: 39.9  Smokeless Tobacco Never  Tobacco Comments   15 pack year history   BP Readings from Last 3 Encounters:  08/21/22 (!) 147/68  07/14/22 114/60  07/10/22 (!) 159/83   Pulse Readings from Last 3 Encounters:  08/21/22 65  07/14/22 70  07/10/22 77   Wt Readings from Last 3 Encounters:  08/21/22 167 lb (75.8 kg)  07/14/22 174 lb 2 oz (79 kg)  07/10/22 175 lb (79.4 kg)   BMI Readings from Last 3 Encounters:  08/21/22 30.54 kg/m  07/14/22 31.85 kg/m  07/10/22 32.01 kg/m    Assessment/Interventions: Review of patient past medical history, allergies, medications, health  status, including review of consultants reports, laboratory and other test data, was performed as part of comprehensive evaluation and provision of chronic care management services.   SDOH:  (Social Determinants of Health) assessments and interventions performed:No  SDOH Interventions    Flowsheet Row Clinical Support from 03/13/2022 in [place] at [place] Management from 07/05/2021 in [place] at [place] from 03/03/2021 in [place] at [place]  Support from 11/18/2019 in [place] at [place] from 09/04/2017 in [place] at [place] Interventions Intervention Not Indicated -- -- -- --  Housing Interventions Intervention Not Indicated -- -- -- --  Transportation Interventions Intervention Not Indicated -- -- -- --  Depression Interventions/Treatment  -- -- PHQ2-9 Score <4 Follow-up Not Indicated PHQ2-9 Score <4 Follow-up Not Indicated --  [referral to PCP]  Financial Strain Interventions -- Intervention Not Indicated -- -- --  Physical Activity Interventions Intervention Not Indicated -- -- -- --  Stress Interventions Intervention Not Indicated -- -- -- --  Social Connections Interventions Intervention Not Indicated -- -- -- --      SDOH Screenings   Food Insecurity: No Food Insecurity (03/13/2022)  Housing: Low Risk  (03/13/2022)  Transportation Needs: No Transportation Needs (03/13/2022)  Alcohol Screen: Low Risk  (03/13/2022)  Depression (PHQ2-9): Low Risk  (03/13/2022)  Financial Resource Strain: Low Risk  (07/05/2021)  Physical Activity: Sufficiently Active (03/13/2022)  Social Connections: Moderately Isolated (03/13/2022)  Stress: No Stress Concern Present (03/13/2022)  Tobacco Use: Medium Risk (08/21/2022)    CCM Care Plan  Allergies  Allergen Reactions   Vibramycin [Doxycycline]     Many years ago - "made me feel funny"     Medications Reviewed Today     Reviewed by Nat Christen, CPhT (Pharmacy Technician) on 08/16/22 at 1411  Med List Status: Complete   Medication Order Taking? Sig Documenting Provider Last Dose Status Informant  aspirin EC 81 MG tablet [id number] Yes Take 81 mg by mouth daily. Swallow whole. [provider]  Active Self  atorvastatin (LIPITOR) 20 MG tablet [id number] Yes Take 1 tablet (20 mg total) by mouth daily. Jinny Sanders, MD  Active Self  Patient not taking:  Discontinued 08/16/22 1409 (Patient Preference)   losartan (COZAAR) 50 MG tablet [id number] Yes TAKE 1 TABLET BY MOUTH EVERY DAY Bedsole, Amy E, MD  Active Self  metoprolol succinate (TOPROL-XL) 100 MG 24 hr tablet [id number] Yes TAKE 1 TABLET BY MOUTH DAILY  WITH OR IMMEDIATELY FOLLOWING A  MEAL Bedsole, Amy E, MD  Active Self  Multiple Vitamin (MULTIVITAMIN WITH MINERALS) TABS tablet [id number] Yes Take 2 tablets by mouth daily. [provider]  Active Self  Naphazoline HCl (CLEAR EYES OP) [id number] Yes Place 1 drop into both eyes every 8 (eight) hours as needed (eye redness). [provider]  Active Self  sodium chloride (OCEAN) 0.65 % SOLN nasal spray [id number] Yes Place 1 spray into both nostrils every 4 (four) hours as needed for congestion. [provider]  Active Self  tamoxifen (NOLVADEX) 20 MG tablet [id number] Yes Take 1 tablet (20 mg total) by mouth daily. Benay Pike, MD  Active Self  triamterene-hydrochlorothiazide (MAXZIDE-25) 37.5-25 MG tablet [id number] Yes TAKE 1 TABLET BY MOUTH DAILY Bedsole, Amy E, MD  Active Self  valACYclovir (VALTREX) 500 MG tablet [id number] Yes Take 1 tablet (500 mg total) by mouth daily. Take daily for suppressive therapy  Patient taking differently: Take 500 mg by mouth daily as needed (outbreaks).   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 02/13/2020   Bradycardia 04/26/2018   Carotid artery stenosis 02/20/2018   Renal artery stenosis (HCC) 01/29/2017   BPPV (benign paroxysmal positional vertigo) 12/17/2015   DDD (degenerative disc disease), lumbar 06/02/2014   Left hip pain 04/17/2014   Lumbar radiculitis 04/17/2014   Lumbar spondylosis 04/17/2014   Other microscopic hematuria 12/16/2013   Allergic rhinitis 10/04/2012   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/2012   Osteoporosis 09/14/2010   Prediabetes 09/14/2010   ONYCHOMYCOSIS 08/12/2009   HYPERTENSION, BENIGN ESSENTIAL, LABILE 02/15/2009   LOW BACK PAIN, CHRONIC 02/01/2009   Fibromuscular dysplasia ([place]) 08/27/2008   Hyperlipidemia 08/28/2007   GERD 08/28/2007   Irritable bowel syndrome 08/28/2007    Immunization History  Administered Date(s) Administered   Fluad Quad(high Dose 65+) 07/24/2019, 09/11/2020, 09/08/2022   Influenza Split 10/03/2011, 08/30/2012   Influenza Whole 08/28/2007, 08/19/2008, 08/12/2009, 09/14/2010   Influenza,inj,Quad PF,6+ Mos 08/13/2013, 08/18/2014, 09/03/2015, 08/03/2016, 09/04/2017, 09/05/2018   PFIZER(Purple Top)SARS-COV-2 Vaccination 12/24/2019, 01/14/2020, 08/22/2020   Pneumococcal Conjugate-13 01/09/2014   Pneumococcal Polysaccharide-23 04/20/2006, 09/04/2017   Td 04/20/2006   Zoster Recombinat (Shingrix) 08/02/2021, 10/24/2021    Conditions to be addressed/monitored:  Hypertension, Hyperlipidemia, and Osteoporosis, Breast Cancer  Care Plan : CCM Pharmacy Care Plan  Updates made by Charlton Haws, [place] since 10/10/2022 12:00 AM     Problem: Hypertension, Hyperlipidemia, and Osteoporosis, Breast Cancer   Priority: High     Long-Range Goal: Disease Management   Start Date: 07/05/2021  Expected End  Date: 02/10/2023  This Visit's Progress: On track  Recent Progress: On track  Priority: High  Note:   Current Barriers:  None identified  Pharmacist Clinical Goal(s):  Patient will contact provider office for questions/concerns as evidenced notation of same in electronic health record through collaboration with PharmD and provider.   Interventions: 1:1 collaboration with Jinny Sanders, MD regarding development and update of comprehensive plan of care as evidenced by provider attestation and co-signature Inter-disciplinary care team collaboration (see longitudinal plan of care) Comprehensive medication review performed; medication list updated in electronic medical record  Hypertension (BP goal <140/90) -Controlled - per clinic readings  -Current home BP readings: 120s/70s -Current treatment: Metoprolol succinate 100 mg daily - Appropriate, Effective, Safe, Accessible Losartan 50 mg daily - Appropriate, Effective, Safe, Accessible Triamterene/HCTZ 37.5-25 mg daily - Appropriate, Effective, Safe, Accessible -Medications previously tried: none reported s -Educated on BP goals and benefits of medications for prevention of heart attack, stroke and kidney damage; -Counseled to monitor BP at home periodically -Recommended to continue current medication  Hyperlipidemia: (LDL goal < 70) -Controlled -  LDL 39 (03/2022) at goal -Current treatment: Atorvastatin 20 mg daily - Appropriate, Effective, Safe, Accessible Aspirin 81 mg daily - Appropriate, Effective, Safe, Accessible -Medications previously tried: none   -Current dietary patterns: she avoids red meats and fried foods, increase vegetables -Current exercise habits: she targets 5000 steps per day, very active -Educated on Cholesterol goals; Importance of limiting foods high in cholesterol; Exercise goal of 150 minutes per week; -Recommended to continue current medication  Breast Cancer -DCIS (Goal: prevent recurrence) -Controlled -  although pt is concerned about tamoxifen side effects; she reports occasional hot flashes, currently tolerable; she read about liver and eye issues - provided reassurance about LFTs (normal 08/2021) and she follows regularly with eye doctor -Follows with Dr Magrinat/Dr Malena Peer. Dx 2018, R lumpectomy 07/2017. Mammogram  09/2020 abnormal. R lumpectomy 11/2020. No evidence of recurrence 10/2021. Plan 5 years of Tamoxifen. -Current treatment  Tamoxifen 20 mg daily (started 03/2021) - Appropriate, Effective, Safe, Accessible -Medications previously tried: n/a  -Pt has upcoming mammogram and appt with oncology, plans to discuss switching tamoxifen -Recommended to continue current medication for now  Osteopenia (Goal prevent fractures) -Controlled -Last DEXA Scan: 08/2019   T-Score femoral neck: -2.0  T-Score total hip: -1.5  T-Score lumbar spine: -1.8  10-year probability of major osteoporotic fracture: 6.0%  10-year probability of hip fracture: 1.5% -Patient is not a candidate for pharmacologic treatment -Current treatment  None -Medications previously tried: n/a  -Recommend [phone] units of vitamin D daily. Recommend 1200 mg of calcium daily from dietary and supplemental sources.  Patient Goals/Self-Care Activities Patient will:  - take medications as prescribed -engage in dietary modifications by continue to limit foods high in cholesterol -contact provider office for questions/concerns      Medication Assistance: None required.  Patient affirms current coverage meets needs.  Compliance/Adherence/Medication fill history: Care Gaps: Colonoscopy (due 05/14/21) - pt to call Dr Lynne Leader office to schedule after TKA  Star-Rating Drugs: Atorvastatin - PDC 21%; LF 08/28/22 X 90 DS (OPTUM) Losartan - PDC 100%  Medication Access: Within the past 30 days, how often has patient missed a dose of medication? 0 Is a pillbox or other method used to improve adherence? Yes  Factors that may affect  medication adherence? no barriers identified Are meds synced by current pharmacy? No  Are meds delivered by current pharmacy? Yes  Does patient experience delays in picking up medications due to transportation concerns? No   [facility] Reviewed: Is patient disadvantaged to use [facility]?: Yes  Current Rx insurance plan: [facility] Name and location of Current pharmacy:  [id number] [place], [place] [id number]: [phone]: [address] [place] [place] [address]Phone: [phone]: [phone] [facility] services reviewed with patient today?: No  Patient requests to transfer care to [facility]?: No  Reason patient declined to change pharmacies: Disadvantaged due to insurance/mail order   Care Plan and Follow Up Patient Decision:  Patient agrees to Care Plan and Follow-up.  Plan: The patient has been provided with contact information for the care management team and has been advised to call with any health related questions or concerns.   LCharlene Brooke PharmD, BCACP Clinical Pharmacist [phone]

## 2022-10-11 ENCOUNTER — Ambulatory Visit: Payer: Medicare Other | Admitting: Internal Medicine

## 2022-10-16 ENCOUNTER — Ambulatory Visit
Admission: RE | Admit: 2022-10-16 | Discharge: 2022-10-16 | Disposition: A | Discharge status: Home / Self Care | Payer: Medicare Other | Source: Ambulatory Visit | Attending: Family Medicine | Admitting: Family Medicine

## 2022-10-16 DIAGNOSIS — Z780 Asymptomatic menopausal state: Secondary | ICD-10-CM

## 2022-10-16 DIAGNOSIS — Z1231 Encounter for screening mammogram for malignant neoplasm of breast: Secondary | ICD-10-CM

## 2022-10-16 DIAGNOSIS — M85852 Other specified disorders of bone density and structure, left thigh: Secondary | ICD-10-CM | POA: Diagnosis not present

## 2022-10-16 DIAGNOSIS — M810 Age-related osteoporosis without current pathological fracture: Secondary | ICD-10-CM | POA: Diagnosis not present

## 2022-10-18 ENCOUNTER — Other Ambulatory Visit: Payer: Self-pay | Admitting: Family Medicine

## 2022-10-18 ENCOUNTER — Telehealth: Payer: Self-pay | Admitting: Hematology and Oncology

## 2022-10-18 DIAGNOSIS — Z853 Personal history of malignant neoplasm of breast: Secondary | ICD-10-CM

## 2022-10-18 NOTE — Telephone Encounter (Signed)
Called patient to notify of new appointment information. Patient notified.

## 2022-10-19 ENCOUNTER — Encounter: Payer: Self-pay | Admitting: Family Medicine

## 2022-10-19 ENCOUNTER — Ambulatory Visit (INDEPENDENT_AMBULATORY_CARE_PROVIDER_SITE_OTHER): Payer: Medicare Other | Admitting: Family Medicine

## 2022-10-19 VITALS — BP 138/70 | HR 79 | Temp 97.6°F | Ht 62.0 in | Wt 176.4 lb

## 2022-10-19 DIAGNOSIS — M810 Age-related osteoporosis without current pathological fracture: Secondary | ICD-10-CM

## 2022-10-19 NOTE — Assessment & Plan Note (Signed)
New diagnosis.Marland Kitchen likely related to tamoxifen as well as post menopausal state.  Check Vit D. Recommend weight bearing exercise, calcium in diet and vit D supplement 400 IU 1-2 times daily.  Discussed fosamax initiation.. will consider start after dental work upcoming.  Re-eval in 2 years.

## 2022-10-19 NOTE — Progress Notes (Signed)
Patient ID: Kristy [name], female    DOB: 1941/08/26, 81 y.o.   MRN: [id number]  This visit was conducted in person.  BP 138/70   Pulse 79   Temp 97.6 F (36.4 C) (Temporal)   Ht '5\' 2"'$  (1.575 m)   Wt 176 lb 6.4 oz (80 kg)   SpO2 98%   BMI 32.26 kg/m    CC:  Chief Complaint  Patient presents with   Results    Wants to discuss DEXA results.     Subjective:   HPI: Kristy [name] is a 81 y.o. postmenopausal female with history of breast cancer presenting on 10/19/2022 for Results (Wants to discuss DEXA results. )  Reviewed bone density  with patient. T score -2.5 in spine, hip -2.3. This is significantly worse from last check in October 2020.  She is currently on tamoxifen  (on year 2 of 5) for breast cancer history.  She gets adequate calcium in her diet, does regular weightbearing exercise.  No recent vitamin D lab check      She also has SE of joint pain from tamoxifen.. has appt  on Dec 13 with Onc.   Relevant past medical, surgical, family and social history reviewed and updated as indicated. Interim medical history since our last visit reviewed. Allergies and medications reviewed and updated. Outpatient Medications Prior to Visit  Medication Sig Dispense Refill   aspirin EC 81 MG tablet Take 81 mg by mouth daily. Swallow whole.     atorvastatin (LIPITOR) 20 MG tablet Take 1 tablet (20 mg total) by mouth daily. 90 tablet 2   losartan (COZAAR) 50 MG tablet TAKE 1 TABLET BY MOUTH EVERY DAY 90 tablet 2   metoprolol succinate (TOPROL-XL) 100 MG 24 hr tablet TAKE 1 TABLET BY MOUTH DAILY  WITH OR IMMEDIATELY FOLLOWING A  MEAL 90 tablet 3   Multiple Vitamin (MULTIVITAMIN WITH MINERALS) TABS tablet Take 2 tablets by mouth daily.     sodium chloride (OCEAN) 0.65 % SOLN nasal spray Place 1 spray into both nostrils every 4 (four) hours as needed for congestion.     tamoxifen (NOLVADEX) 20 MG tablet Take 1 tablet (20 mg total) by mouth daily. 90 tablet 3    triamterene-hydrochlorothiazide (MAXZIDE-25) 37.5-25 MG tablet TAKE 1 TABLET BY MOUTH DAILY 100 tablet 3   valACYclovir (VALTREX) 500 MG tablet Take 1 tablet (500 mg total) by mouth daily. Take daily for suppressive therapy (Patient taking differently: Take 500 mg by mouth daily as needed (outbreaks).) 30 tablet 11   Naphazoline HCl (CLEAR EYES OP) Place 1 drop into both eyes every 8 (eight) hours as needed (eye redness).     No facility-administered medications prior to visit.     Per HPI unless specifically indicated in ROS section below Review of Systems  Constitutional:  Negative for fatigue and fever.  HENT:  Negative for congestion.   Eyes:  Negative for pain.  Respiratory:  Negative for cough and shortness of breath.   Cardiovascular:  Negative for chest pain, palpitations and leg swelling.  Gastrointestinal:  Negative for abdominal pain.  Genitourinary:  Negative for dysuria and vaginal bleeding.  Musculoskeletal:  Negative for back pain.  Neurological:  Negative for syncope, light-headedness and headaches.  Psychiatric/Behavioral:  Negative for dysphoric mood.    Objective:  BP 138/70   Pulse 79   Temp 97.6 F (36.4 C) (Temporal)   Ht '5\' 2"'$  (1.575 m)   Wt 176 lb 6.4 oz (80 kg)  SpO2 98%   BMI 32.26 kg/m   Wt Readings from Last 3 Encounters:  10/19/22 176 lb 6.4 oz (80 kg)  08/21/22 167 lb (75.8 kg)  07/14/22 174 lb 2 oz (79 kg)      Physical Exam Constitutional:      General: She is not in acute distress.    Appearance: Normal appearance. She is well-developed. She is not ill-appearing or toxic-appearing.  HENT:     Head: Normocephalic.     Right Ear: Hearing, tympanic membrane, ear canal and external ear normal. Tympanic membrane is not erythematous, retracted or bulging.     Left Ear: Hearing, tympanic membrane, ear canal and external ear normal. Tympanic membrane is not erythematous, retracted or bulging.     Nose: No mucosal edema or rhinorrhea.     Right  Sinus: No maxillary sinus tenderness or frontal sinus tenderness.     Left Sinus: No maxillary sinus tenderness or frontal sinus tenderness.     Mouth/Throat:     Pharynx: Uvula midline.  Eyes:     General: Lids are normal. Lids are everted, no foreign bodies appreciated.     Conjunctiva/sclera: Conjunctivae normal.     Pupils: Pupils are equal, round, and reactive to light.  Neck:     Thyroid: No thyroid mass or thyromegaly.     Vascular: No carotid bruit.     Trachea: Trachea normal.  Cardiovascular:     Rate and Rhythm: Normal rate and regular rhythm.     Pulses: Normal pulses.     Heart sounds: Normal heart sounds, S1 normal and S2 normal. No murmur heard.    No friction rub. No gallop.  Pulmonary:     Effort: Pulmonary effort is normal. No tachypnea or respiratory distress.     Breath sounds: Normal breath sounds. No decreased breath sounds, wheezing, rhonchi or rales.  Abdominal:     General: Bowel sounds are normal.     Palpations: Abdomen is soft.     Tenderness: There is no abdominal tenderness.  Musculoskeletal:     Cervical back: Normal range of motion and neck supple.  Skin:    General: Skin is warm and dry.     Findings: No rash.  Neurological:     Mental Status: She is alert.  Psychiatric:        Mood and Affect: Mood is not anxious or depressed.        Speech: Speech normal.        Behavior: Behavior normal. Behavior is cooperative.        Thought Content: Thought content normal.        Judgment: Judgment normal.       Results for orders placed or performed during the hospital encounter of 08/21/22  Surgical pcr screen   Specimen: Nasal Mucosa; Nasal Swab  Result Value Ref Range   MRSA, PCR NEGATIVE NEGATIVE   Staphylococcus aureus NEGATIVE NEGATIVE  CBC per protocol  Result Value Ref Range   WBC 9.5 4.0 - 10.5 K/uL   RBC 4.09 3.87 - 5.11 MIL/uL   Hemoglobin 13.4 12.0 - 15.0 g/dL   HCT 40.9 36.0 - 46.0 %   MCV 100.0 80.0 - 100.0 fL   MCH 32.8 26.0 -  34.0 pg   MCHC 32.8 30.0 - 36.0 g/dL   RDW 12.4 11.5 - 15.5 %   Platelets 337 150 - 400 K/uL   nRBC 0.0 0.0 - 0.2 %  Basic metabolic panel per protocol  Result  Value Ref Range   Sodium 137 135 - 145 mmol/L   Potassium 3.7 3.5 - 5.1 mmol/L   Chloride 106 98 - 111 mmol/L   CO2 24 22 - 32 mmol/L   Glucose, Bld 95 70 - 99 mg/dL   BUN 19 8 - 23 mg/dL   Creatinine, Ser 1.05 (H) 0.44 - 1.00 mg/dL   Calcium 10.1 8.9 - 10.3 mg/dL   GFR, Estimated 54 (L) >60 mL/min   Anion gap 7 5 - 15     COVID 19 screen:  No recent travel or known exposure to COVID19 The patient denies respiratory symptoms of COVID 19 at this time. The importance of social distancing was discussed today.   Assessment and Plan    Problem List Items Addressed This Visit     Osteoporosis - Primary    New diagnosis.Marland Kitchen likely related to tamoxifen as well as post menopausal state.  Check Vit D. Recommend weight bearing exercise, calcium in diet and vit D supplement 400 IU 1-2 times daily.  Discussed fosamax initiation.. will consider start after dental work upcoming.  Re-eval in 2 years.      Relevant Orders   VITAMIN D 25 Hydroxy (Vit-D Deficiency, Fractures)     Eliezer Lofts, MD

## 2022-10-19 NOTE — Patient Instructions (Signed)
Please stop at the lab to have labs drawn.  Work on [employer] bearing exercise, calcium in diet and vit D supplement.  Consider starting fosamax after upcoming dental work.

## 2022-10-20 LAB — VITAMIN D 25 HYDROXY (VIT D DEFICIENCY, FRACTURES): VITD: 41.2 ng/mL (ref 30.00–100.00)

## 2022-10-23 DIAGNOSIS — H0288B Meibomian gland dysfunction left eye, upper and lower eyelids: Secondary | ICD-10-CM | POA: Diagnosis not present

## 2022-10-23 DIAGNOSIS — H0288A Meibomian gland dysfunction right eye, upper and lower eyelids: Secondary | ICD-10-CM | POA: Diagnosis not present

## 2022-10-23 DIAGNOSIS — H2513 Age-related nuclear cataract, bilateral: Secondary | ICD-10-CM | POA: Diagnosis not present

## 2022-10-23 DIAGNOSIS — H25013 Cortical age-related cataract, bilateral: Secondary | ICD-10-CM | POA: Diagnosis not present

## 2022-10-23 DIAGNOSIS — H04123 Dry eye syndrome of bilateral lacrimal glands: Secondary | ICD-10-CM | POA: Diagnosis not present

## 2022-10-23 DIAGNOSIS — H524 Presbyopia: Secondary | ICD-10-CM | POA: Diagnosis not present

## 2022-10-23 DIAGNOSIS — H52213 Irregular astigmatism, bilateral: Secondary | ICD-10-CM | POA: Diagnosis not present

## 2022-10-24 ENCOUNTER — Other Ambulatory Visit: Payer: Self-pay | Admitting: Family Medicine

## 2022-10-24 NOTE — Telephone Encounter (Signed)
Last office visit 10/19/22 for Osteoporosis.  Last refilled 05/18/20 for #30 with 11 refills.  No future appointments with PCP.

## 2022-10-31 ENCOUNTER — Ambulatory Visit
Admission: RE | Admit: 2022-10-31 | Discharge: 2022-10-31 | Disposition: A | Discharge status: Home / Self Care | Payer: Medicare Other | Source: Ambulatory Visit | Attending: Family Medicine | Admitting: Family Medicine

## 2022-10-31 DIAGNOSIS — Z853 Personal history of malignant neoplasm of breast: Secondary | ICD-10-CM

## 2022-10-31 DIAGNOSIS — R922 Inconclusive mammogram: Secondary | ICD-10-CM | POA: Diagnosis not present

## 2022-11-01 ENCOUNTER — Inpatient Hospital Stay: Payer: Medicare Other | Attending: Hematology and Oncology | Admitting: Hematology and Oncology

## 2022-11-01 ENCOUNTER — Ambulatory Visit: Payer: Medicare Other | Admitting: Hematology and Oncology

## 2022-11-01 VITALS — BP 146/74 | HR 80 | Temp 98.8°F | Resp 16 | Ht 62.0 in | Wt 171.6 lb

## 2022-11-01 DIAGNOSIS — Z90710 Acquired absence of both cervix and uterus: Secondary | ICD-10-CM | POA: Diagnosis not present

## 2022-11-01 DIAGNOSIS — Z923 Personal history of irradiation: Secondary | ICD-10-CM | POA: Insufficient documentation

## 2022-11-01 DIAGNOSIS — Z7982 Long term (current) use of aspirin: Secondary | ICD-10-CM | POA: Insufficient documentation

## 2022-11-01 DIAGNOSIS — Z79899 Other long term (current) drug therapy: Secondary | ICD-10-CM | POA: Insufficient documentation

## 2022-11-01 DIAGNOSIS — D0511 Intraductal carcinoma in situ of right breast: Secondary | ICD-10-CM | POA: Insufficient documentation

## 2022-11-01 DIAGNOSIS — Z87891 Personal history of nicotine dependence: Secondary | ICD-10-CM | POA: Diagnosis not present

## 2022-11-01 DIAGNOSIS — Z79810 Long term (current) use of selective estrogen receptor modulators (SERMs): Secondary | ICD-10-CM | POA: Diagnosis not present

## 2022-11-01 DIAGNOSIS — R232 Flushing: Secondary | ICD-10-CM | POA: Diagnosis not present

## 2022-11-01 NOTE — Progress Notes (Signed)
[facility]  [phone] [phone]    ID: DONIS KOTOWSKI DOB: 06/24/1941  MR#: [id number]  CSN#:[id number]  Patient Care Team: Jinny Sanders, MD as PCP - General [name], MD as Consulting Physician (General Surgery) Magrinat, Virgie Dad, MD (Inactive) as Consulting Physician (Oncology) Kyung Rudd, MD as Consulting Physician (Radiation Oncology) Ladene Artist, MD as Consulting Physician (Gastroenterology) Mauro Kaufmann, RN as Oncology Nurse Navigator Rockwell Germany, RN as Oncology Nurse Navigator Charlton Haws, [facility] as Pharmacist (Pharmacist) Benay Pike, MD OTHER MD:   CHIEF COMPLAINT: noninvasive breast cancer, estrogen receptor positive  CURRENT TREATMENT: Tamoxifen   INTERVAL HISTORY: Kaliah returns today for follow up of her noninvasive breast cancer.  She started tamoxifen on 03/22/2021.  She has noticed some joint aches all over from tamoxifen, tolerable. She also has some hot flashes. No vaginal discharge. She will be getting knee replacement surgery soon. She had a tooth infection, hence this had to be postponed. Besides this, she denies any issues. She denies any changes in her breast.  Since her last visit, she underwent bilateral diagnostic mammography with tomography at [facility] on 10/31/2022 showing: breast density category C; no evidence of malignancy in either breasts   COVID 19 VACCINATION STATUS: fully vaccinated [employer]), with booster 08/2020   HISTORY OF CURRENT ILLNESS: From the original intake note:  VIVIANE SEMIDEY has a history of right breast lumpectomy on 08/14/2017 under Dr. Brantley Stage for a tubular adenoma with calcifications.  More recently, she had routine screening mammography on 10/11/2020 showing a possible abnormality in the right breast. She underwent right diagnostic mammography with tomography at [facility] on 10/29/2020 showing: breast density category C;  indeterminate 1.1 cm group of fine pleomorphic calcifications in lower-outer right breast.  Accordingly on 11/09/2020 she proceeded to biopsy of the right breast area in question. The pathology from this procedure ([id number]) showed: ductal carcinoma in situ, high grade, with necrosis and calcifications; complex sclerosing lesion. Prognostic indicators significant for: estrogen receptor, 95% positive with strong staining intensity and progesterone receptor, 0% negative.   She underwent breast MRI on 11/15/2020 showing: breast composition C; enhancement around a small hematoma and biopsy tract in lower-outer right breast consistent with patient's known malignancy; indeterminate 6 mm enhancing mass with suspicious kinetics, just superior and posterior to biopsy site in lower-outer right breast; indeterminate 1.8 cm area of linear non-mass enhancement in superior central right breast; no evidence of malignancy in left breast or bilateral lymph nodes.  She proceeded to additional lower-outer right breast biopsy on 11/23/2020. Pathology [id number]) again showed ductal carcinoma in situ, high grade, with necrosis and calcifications. Prognostic panel showing: estrogen receptor 100% positive with strong staining intensity; progesterone receptor 0% negative.  Two additional right breast biopsies were obtained on 11/29/2020. Pathology [id number]) showed: 1. Right Breast, upper central  - ductal carcinoma in situ with necrosis and calcifications 2. 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 Brantley Stage. Pathology from the procedure ([id number]) showed: multifocal intermediate to high grade ductal carcinoma in situ; carcinoma present at cauterized inferior margin; complex sclerosing lesion.   Cancer  Staging  Ductal carcinoma in situ (DCIS) of right breast Staging form: Breast, AJCC 7th Edition - Pathologic: Stage 0 (Tis (DCIS), N0, cM0) - Signed by Chauncey Cruel,  MD on 12/27/2020 Histologic grade (G): G3 Estrogen receptor status: Positive Progesterone receptor status: Negative   The patient's subsequent history is as detailed below.    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   Constipation    uses stool softener PRN- uses once a week to once every 2 weeks    Fibromuscular dysplasia ([place])    Gallbladder disease    Hyperlipidemia    Hypertension    For 16 years; urinary catecholamines within normal limits 12/09; renal Doppler ultrasound showed no evidence for renal artery stenosis   Normal echocardiogram 02/2009   LVEF 65%; no regional wall motion abnormalities; normal RV size and function; pulmonic valve had increased gradient across w/ peak gradient of about 36 mmHg (range of moderate pulmonic stenosis) followup showed normal valve   Papilloma of breast    right   Personal history of radiation therapy    Tinnitus of left ear    Tubular adenoma of colon 10/2013    PAST SURGICAL HISTORY: Past Surgical History:  Procedure Laterality Date   ANGIOPLASTY     2012   BREAST BIOPSY Right 2006   benign   BREAST BIOPSY Right 11/09/2020   BREAST BIOPSY Right 11/23/2020   BREAST BIOPSY Right 11/29/2020   x2   BREAST EXCISIONAL BIOPSY Right 07/2017   benign   BREAST LUMPECTOMY Right 12/16/2020   BREAST LUMPECTOMY WITH RADIOACTIVE SEED LOCALIZATION Right 08/14/2017   Procedure: RIGHT BREAST  LUMPECTOMY WITH RADIOACTIVE SEED LOCALIZATION;  Surgeon: [name], MD;  Location: [facility];  Service: General;  Laterality: Right;   BREAST LUMPECTOMY WITH RADIOACTIVE SEED LOCALIZATION Right 12/16/2020   Procedure: RADIOACTIVE SEED GUIDED TIMES 3 RIGHT BREAST LUMPECTOMY;  Surgeon: [name], MD;  Location: [facility];  Service: General;  Laterality: Right;   [place]  09/2006   Neg   CHOLECYSTECTOMY N/A 09/22/2021   Procedure: LAPAROSCOPIC CHOLECYSTECTOMY WITH  INTRAOPERATIVE CHOLANGIOGRAM;  Surgeon: [name], MD;  Location: [facility];  Service: General;  Laterality: N/A;   COLONOSCOPY     KNEE CARTILAGE SURGERY     right knee   POLYPECTOMY     RE-EXCISION OF BREAST LUMPECTOMY Right 01/11/2021   Procedure: RE-EXCISION OF RIGHT BREAST LUMPECTOMY;  Surgeon: [name], MD;  Location: [facility];  Service: General;  Laterality: Right;   TOTAL ABDOMINAL HYSTERECTOMY     no cervix   UPPER GASTROINTESTINAL ENDOSCOPY      FAMILY HISTORY: Family History  Problem Relation Age of Onset   Prostate cancer Father    Lung cancer Father    Colon cancer Maternal Uncle    Colon cancer Paternal Uncle    Colon polyps Sister    Coronary artery disease Paternal Grandmother    Goiter Other        ?   Esophageal cancer Neg Hx    Stomach cancer Neg Hx    Rectal cancer Neg Hx    Bladder Cancer Neg Hx    Kidney cancer Neg Hx   The patient's father died  at the age of 21 from lung cancer.  The patient's mother died from amyotrophic lateral sclerosis at age 34.  The patient has 1 brother and 1 sister.  There is no other family history of cancer to her knowledge   GYNECOLOGIC HISTORY:  No LMP recorded. Patient has had a hysterectomy. Menarche: 81 years old [place] P 0 LMP at the time of hysterectomy in 1993, for fibroids contraceptive no HRT yes, about 4 years  Hysterectomy? yes BSO?  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  Her husband died from colon cancer at age 37.  At [facility] she lives with Pixie Casino, her significant other, who is a retired Administrator.   ADVANCED DIRECTIVES: Not in place; the patient is considering naming her niece Bayard Males, a retired Marine scientist, as her healthcare power of [place]: Social History   Tobacco Use   Smoking status: Former    Packs/day: 1.00    Years: 15.00    Total pack years: 15.00    Types: Cigarettes    Quit date: 11/20/1982    Years since quitting: 39.9   Smokeless tobacco: Never   Tobacco comments:    15 pack year history  Vaping Use   Vaping Use: Never used  Substance Use Topics   Alcohol use: No   Drug use: No     Colonoscopy: 04/2019 (Dr. Fuller Plan), recall 2022  PAP: Status post hysterectomy  Bone density: 08/2019, -2.0   Allergies  Allergen Reactions   Vibramycin [Doxycycline]     Many years ago - "made me feel funny"    Current Outpatient Medications  Medication Sig Dispense Refill   aspirin EC 81 MG tablet Take 81 mg by mouth daily. Swallow whole.     atorvastatin (LIPITOR) 20 MG tablet Take 1 tablet (20 mg total) by mouth daily. 90 tablet 2   losartan (COZAAR) 50 MG tablet TAKE 1 TABLET BY MOUTH EVERY DAY 90 tablet 2   metoprolol succinate (TOPROL-XL) 100 MG 24 hr tablet TAKE 1 TABLET BY MOUTH DAILY  WITH OR IMMEDIATELY FOLLOWING A  MEAL 90 tablet 3   Multiple Vitamin (MULTIVITAMIN WITH MINERALS) TABS tablet Take 2 tablets by mouth daily.     sodium chloride (OCEAN) 0.65 % SOLN nasal spray Place 1 spray into both nostrils every 4 (four) hours as needed for congestion.     tamoxifen (NOLVADEX) 20 MG tablet Take 1 tablet (20 mg total) by mouth daily. 90 tablet 3   triamterene-hydrochlorothiazide (MAXZIDE-25) 37.5-25 MG tablet TAKE 1 TABLET BY MOUTH DAILY 100 tablet 3   valACYclovir (VALTREX) 500 MG  tablet Take 1 tablet (500 mg total) by mouth daily. Take daily for suppressive therapy 30 tablet 11   No current facility-administered medications for this visit.    OBJECTIVE: African-American woman who appears younger than stated age  24:   11/01/22 1416  BP: (!) 146/74  Pulse: 80  Resp: 16  Temp: 98.8 F (37.1 C)  SpO2: 100%      Body mass index is 31.39 kg/m.   Wt Readings from Last 3 Encounters:  11/01/22 171 lb 9.6 oz (77.8 kg)  10/19/22 176 lb 6.4 oz (80 kg)  08/21/22 167 lb (75.8 kg)     ECOG FS:1 - Symptomatic but completely ambulatory  Sclerae unicteric, EOMs intact Bilateral breasts inspected and palpated.  Right breast status postlumpectomy  changes and radiation changes.  No other palpable masses or regional adenopathy.  Left breast normal to inspection and palpation.  LAB RESULTS:  CMP     Component Value Date/Time   NA 137 08/21/2022 1330   K 3.7 08/21/2022 1330   CL 106 08/21/2022 1330   CO2 24 08/21/2022 1330   GLUCOSE 95 08/21/2022 1330   BUN 19 08/21/2022 1330   CREATININE 1.05 (H) 08/21/2022 1330   CALCIUM 10.1 08/21/2022 1330   PROT 6.7 04/04/2022 1058   ALBUMIN 4.0 04/04/2022 1058   AST 18 04/04/2022 1058   ALT 15 04/04/2022 1058   ALKPHOS 30 (L) 04/04/2022 1058   BILITOT 0.4 04/04/2022 1058   GFRNONAA 54 (L) 08/21/2022 1330   GFRAA >60 08/09/2017 1300    No results found for: "TOTALPROTELP", "ALBUMINELP", "A1GS", "A2GS", "BETS", "BETA2SER", "GAMS", "MSPIKE", "SPEI"  Lab Results  Component Value Date   WBC 9.5 08/21/2022   NEUTROABS 2.2 05/22/2022   HGB 13.4 08/21/2022   HCT 40.9 08/21/2022   MCV 100.0 08/21/2022   PLT 337 08/21/2022    No results found for: "LABCA2"  No components found for: "ZOXWRU045"  No results for input(s): "INR" in the last 168 hours.  No results found for: "LABCA2"  No results found for: "WUJ811"  No results found for: "CAN125"  No results found for: "CAN153"  No results found for:  "CA2729"  No components found for: "HGQUANT"  No results found for: "CEA1", "CEA" / No results found for: "CEA1", "CEA"   No results found for: "AFPTUMOR"  No results found for: "CHROMOGRNA"  No results found for: "KPAFRELGTCHN", "LAMBDASER", "KAPLAMBRATIO" (kappa/lambda light chains)  No results found for: "HGBA", "HGBA2QUANT", "HGBFQUANT", "HGBSQUAN" (Hemoglobinopathy evaluation)   No results found for: "LDH"  No results found for: "IRON", "TIBC", "IRONPCTSAT" (Iron and TIBC)  No results found for: "FERRITIN"  Urinalysis    Component Value Date/Time   COLORURINE STRAW (A) 03/02/2017 1320   APPEARANCEUR Clear 08/05/2019 1555   LABSPEC 1.004 (L) 03/02/2017 1320   PHURINE 5.0 03/02/2017 1320   GLUCOSEU Negative 08/05/2019 1555   HGBUR MODERATE (A) 03/02/2017 1320   HGBUR large 11/03/2010 1548   BILIRUBINUR Negative 05/22/2022 1205   BILIRUBINUR Negative 08/05/2019 1555   KETONESUR NEGATIVE 03/02/2017 1320   PROTEINUR Negative 05/22/2022 1205   PROTEINUR Negative 08/05/2019 1555   PROTEINUR NEGATIVE 03/02/2017 1320   UROBILINOGEN 0.2 05/22/2022 1205   UROBILINOGEN 0.2 06/10/2011 1040   NITRITE Negative 05/22/2022 1205   NITRITE Negative 08/05/2019 1555   NITRITE NEGATIVE 03/02/2017 1320   LEUKOCYTESUR Negative 05/22/2022 1205   LEUKOCYTESUR Negative 08/05/2019 1555    STUDIES: MM DIAG BREAST TOMO BILATERAL  Result Date: 10/31/2022 CLINICAL DATA:  81 year old female status post malignant right lumpectomy. EXAM: DIGITAL DIAGNOSTIC BILATERAL MAMMOGRAM WITH TOMOSYNTHESIS TECHNIQUE: Bilateral digital diagnostic mammography and breast tomosynthesis was performed. COMPARISON:  Previous exam(s). ACR Breast Density Category c: The breast tissue is heterogeneously dense, which may obscure small masses. FINDINGS: There are stable post lumpectomy changes in the central right breast. No new or suspicious findings in either breast. The parenchymal pattern is unchanged. IMPRESSION:  1. No mammographic evidence of malignancy in either breast. 2. Stable right breast posttreatment changes. RECOMMENDATION: Per protocol, as the patient is now 2 or more years status post lumpectomy, she may return to annual screening mammography in 1 year. However, given the history of breast cancer, the patient remains eligible for annual diagnostic mammography if preferred. I have discussed the findings and  recommendations with the patient. If applicable, a reminder letter will be sent to the patient regarding the next appointment. BI-RADS CATEGORY  2: Benign. Electronically Signed   By: Kristopher Oppenheim M.D.   On: 10/31/2022 13:51  DG Bone Density  Result Date: 10/16/2022 EXAM: DUAL X-RAY ABSORPTIOMETRY (DXA) FOR BONE MINERAL DENSITY IMPRESSION: Referring Physician:  [place] A TOWER Your patient completed a bone mineral density test using GE Lunar iDXA system (analysis version: 16). Technologist: sec PATIENT: Name: Melis, Trochez Patient ID: [id number] Birth Date: 1941/08/19 Height: 61.5 in. Sex: Female Measured: 10/16/2022 Weight: 174.0 lbs. Indications: Advanced Age, Estrogen Deficient, Hysterectomy, Postmenopausal Fractures: None Treatments: Vitamin D (E933.5) ASSESSMENT: The BMD measured at AP Spine L2-L3 is 0.910 g/cm2 with a T-score of -2.5. This patient is considered osteoporotic according to [facility]) criteria. The quality of the exam is good. L1, L4 was excluded due to degenerative changes. Site Region Measured Date Measured Age YA BMD Significant CHANGE T-score AP Spine L2-L3 10/16/2024 79.3 -2.7 0.910 g/cm2 AP Spine L2-L3 09/15/2019 76.2 -2.3 0.933 g/cm2 DualFemur Neck Right 10/16/2022 79.3 -2.3 0.721 g/cm2 * DualFemur Neck Right 09/15/2019 76.2 -2.0 0.763 g/cm2 * DualFemur Total Mean 10/16/2022 79.3 -1.5 0.821 g/cm2 DualFemur Total Mean 09/15/2019 76.2 -1.5 0.817 g/cm2 Left Forearm Radius 33% 10/16/2022 79.3 -0.6 0.822 g/cm2 World Health Organization [facility]) criteria for  post-menopausal, Caucasian Women: Normal       T-score at or above -1 SD Osteopenia   T-score between -1 and -2.5 SD Osteoporosis T-score at or below -2.5 SD RECOMMENDATION: 1. All patients should optimize calcium and vitamin D intake. 2. Consider FDA-approved medical therapies in postmenopausal women and men aged 99 years and older, based on the following: a. A hip or vertebral (clinical or morphometric) fracture. b. T-score = -2.5 at the femoral neck or spine after appropriate evaluation to exclude secondary causes. c. Low bone mass (T-score between -1.0 and -2.5 at the femoral neck or spine) and a 10-year probability of a hip fracture = 3% or a 10-year probability of a major osteoporosis-related fracture = 20% based on the [place]-adapted WHO algorithm. d. Clinician judgment and/or patient preferences may indicate treatment for people with 10-year fracture probabilities above or below these levels. FOLLOW-UP: Patients with diagnosis of osteoporosis or at high risk for fracture should have regular bone mineral density tests.? Patients eligible for Medicare are allowed routine testing every 2 years.? The testing frequency can be increased to one year for patients who have rapidly progressing disease, are receiving or discontinuing medical therapy to restore bone mass, or have additional risk factors. I have reviewed this study and agree with the findings. [facility] Electronically Signed   By: Zerita Boers M.D.   On: 10/16/2022 15:03     ELIGIBLE FOR AVAILABLE RESEARCH PROTOCOL:   ASSESSMENT: 81 y.o. [place] woman status post right lumpectomy 12/16/2020, for a multi centric ductal carcinoma in situ, grade 3, with a positive inferior lateral margin  (a) consider additional surgery for margin clearance  (1) adjuvant radiation 02/16/2021 through 03/15/2021 Site Technique Total Dose (Gy) Dose per Fx (Gy) Completed Fx Beam Energies  Breast, Right: Breast_Rt 3D 42.56/42.56 2.66 16/16 6X  Breast,  Right: Breast_Rt_Bst 3D 8/8 2 4/4 6X   (2) tamoxifen started 03/22/2021  (a) status post hysterectomy  (b) bone density 09/15/2019 shows a T score of -2.0   PLAN:  Patient is coming up on 2 years from definitive surgery for her noninvasive breast cancer with no evidence  of disease recurrence.  This is favorable. She is tolerating tamoxifen well and the plan is to continue that a total of 5 years. She clearly understands to hold the medication for about a couple weeks before knee replacement and until she resumes mobility after surgery.  She understands the risk of DVT/PE with tamoxifen as well as knee replacement. Last mammogram with no concerns for malignancy.  She will return to clinic in 1 year or sooner as needed  Total encounter time 30 minutes.*  She is a new patient to me, transfer from Dr. Jana Hakim upon his retirement.  *Total Encounter Time as defined by the Centers for Medicare and Medicaid Services includes, in addition to the face-to-face time of a patient visit (documented in the note above) non-face-to-face time: obtaining and reviewing outside history, ordering and reviewing medications, tests or procedures, care coordination (communications with other health care professionals or caregivers) and documentation in the medical record.

## 2022-11-02 ENCOUNTER — Encounter: Payer: Self-pay | Admitting: Hematology and Oncology

## 2022-12-01 ENCOUNTER — Telehealth: Payer: Self-pay | Admitting: *Deleted

## 2022-12-01 NOTE — Telephone Encounter (Signed)
Patient called to advise that she is having new pain in right breast with moderate soreness to the nipple area.  Discussed with Dr. Chryl Heck and will bring in next week for exam.  Patient aware and appointment made.

## 2022-12-01 NOTE — Telephone Encounter (Signed)
Patient called to advise that right breast is extremely tender as well as the nipple area.

## 2022-12-05 ENCOUNTER — Telehealth: Payer: Self-pay | Admitting: Hematology and Oncology

## 2022-12-05 NOTE — Telephone Encounter (Signed)
Called patient per 1/15 in basket. Left voicemail with new appointment information.

## 2022-12-06 ENCOUNTER — Inpatient Hospital Stay: Payer: Medicare Other | Attending: Hematology and Oncology | Admitting: Hematology and Oncology

## 2022-12-06 ENCOUNTER — Encounter: Payer: Self-pay | Admitting: Hematology and Oncology

## 2022-12-06 VITALS — BP 145/67 | HR 73 | Temp 98.0°F | Resp 16 | Ht 62.0 in | Wt 175.6 lb

## 2022-12-06 DIAGNOSIS — Z923 Personal history of irradiation: Secondary | ICD-10-CM | POA: Diagnosis not present

## 2022-12-06 DIAGNOSIS — Z8349 Family history of other endocrine, nutritional and metabolic diseases: Secondary | ICD-10-CM | POA: Insufficient documentation

## 2022-12-06 DIAGNOSIS — Z9049 Acquired absence of other specified parts of digestive tract: Secondary | ICD-10-CM | POA: Insufficient documentation

## 2022-12-06 DIAGNOSIS — Z83719 Family history of colon polyps, unspecified: Secondary | ICD-10-CM | POA: Insufficient documentation

## 2022-12-06 DIAGNOSIS — Z79624 Long term (current) use of inhibitors of nucleotide synthesis: Secondary | ICD-10-CM | POA: Insufficient documentation

## 2022-12-06 DIAGNOSIS — Z8249 Family history of ischemic heart disease and other diseases of the circulatory system: Secondary | ICD-10-CM | POA: Diagnosis not present

## 2022-12-06 DIAGNOSIS — Z8042 Family history of malignant neoplasm of prostate: Secondary | ICD-10-CM | POA: Insufficient documentation

## 2022-12-06 DIAGNOSIS — Z86010 Personal history of colonic polyps: Secondary | ICD-10-CM | POA: Diagnosis not present

## 2022-12-06 DIAGNOSIS — Z79899 Other long term (current) drug therapy: Secondary | ICD-10-CM | POA: Insufficient documentation

## 2022-12-06 DIAGNOSIS — Z800 Family history of malignant neoplasm of digestive organs: Secondary | ICD-10-CM | POA: Insufficient documentation

## 2022-12-06 DIAGNOSIS — E785 Hyperlipidemia, unspecified: Secondary | ICD-10-CM | POA: Diagnosis not present

## 2022-12-06 DIAGNOSIS — D0511 Intraductal carcinoma in situ of right breast: Secondary | ICD-10-CM | POA: Insufficient documentation

## 2022-12-06 DIAGNOSIS — Z170 Estrogen receptor positive status [ER+]: Secondary | ICD-10-CM | POA: Diagnosis not present

## 2022-12-06 DIAGNOSIS — I10 Essential (primary) hypertension: Secondary | ICD-10-CM | POA: Diagnosis not present

## 2022-12-06 DIAGNOSIS — Z79810 Long term (current) use of selective estrogen receptor modulators (SERMs): Secondary | ICD-10-CM | POA: Diagnosis not present

## 2022-12-06 DIAGNOSIS — N6489 Other specified disorders of breast: Secondary | ICD-10-CM | POA: Diagnosis not present

## 2022-12-06 DIAGNOSIS — Z87891 Personal history of nicotine dependence: Secondary | ICD-10-CM | POA: Insufficient documentation

## 2022-12-06 DIAGNOSIS — Z801 Family history of malignant neoplasm of trachea, bronchus and lung: Secondary | ICD-10-CM | POA: Diagnosis not present

## 2022-12-06 DIAGNOSIS — Z881 Allergy status to other antibiotic agents status: Secondary | ICD-10-CM | POA: Diagnosis not present

## 2022-12-06 DIAGNOSIS — Z90710 Acquired absence of both cervix and uterus: Secondary | ICD-10-CM | POA: Diagnosis not present

## 2022-12-06 MED ORDER — DOXYCYCLINE HYCLATE 100 MG PO TABS: 100.0000 mg | ORAL_TABLET | Freq: Two times a day (BID) | ORAL | 0 refills | Status: DC

## 2022-12-06 NOTE — Progress Notes (Signed)
[place]  [phone]) [phone] [phone]) [phone]    ID: Kristy [name] DOB: 1941-04-25  MR#: [id number]  GHW#:[id number]  Patient Care Team: Jinny Sanders, MD as PCP - General [name], MD as Consulting Physician (General Surgery) Magrinat, Virgie Dad, MD (Inactive) as Consulting Physician (Oncology) Kyung Rudd, MD as Consulting Physician (Radiation Oncology) Ladene Artist, MD as Consulting Physician (Gastroenterology) Mauro Kaufmann, RN as Oncology Nurse Navigator Rockwell Germany, RN as Oncology Nurse Navigator Charlton Haws, [facility] as Pharmacist (Pharmacist) Benay Pike, MD  CHIEF COMPLAINT: noninvasive breast cancer, estrogen receptor positive  CURRENT TREATMENT: Tamoxifen  INTERVAL HISTORY:  Kristy [name] returns today for follow up of her noninvasive breast cancer.  She started tamoxifen on 03/22/2021.  She is here for an unplanned visit recently and noticed some pain and redness in the right breast as well as nipple.  She is anticipating knee replacement in February and wanted to make sure that she is not infected.  She was also very worried if she has some disease like Paget's disease.  She recently had a mammogram in December which was unremarkable.  The redness and pain has improved since onset but continues to happen intermittently. Since her last visit, she underwent bilateral diagnostic mammography with tomography at [facility] on 10/31/2022 showing: breast density category C; no evidence of malignancy in either breasts   COVID 19 VACCINATION STATUS: fully vaccinated [employer]), with booster 08/2020   HISTORY OF CURRENT ILLNESS: From the original intake note:  Kristy [name] has a history of right breast lumpectomy on 08/14/2017 under Dr. Brantley Stage for a tubular adenoma with calcifications.  More recently, she had routine screening mammography on 10/11/2020 showing a possible abnormality in the right breast. She underwent right  diagnostic mammography with tomography at [facility] on 10/29/2020 showing: breast density category C; indeterminate 1.1 cm group of fine pleomorphic calcifications in lower-outer right breast.  Accordingly on 11/09/2020 she proceeded to biopsy of the right breast area in question. The pathology from this procedure ([id number]) showed: ductal carcinoma in situ, high grade, with necrosis and calcifications; complex sclerosing lesion. Prognostic indicators significant for: estrogen receptor, 95% positive with strong staining intensity and progesterone receptor, 0% negative.   She underwent breast MRI on 11/15/2020 showing: breast composition C; enhancement around a small hematoma and biopsy tract in lower-outer right breast consistent with patient's known malignancy; indeterminate 6 mm enhancing mass with suspicious kinetics, just superior and posterior to biopsy site in lower-outer right breast; indeterminate 1.8 cm area of linear non-mass enhancement in superior central right breast; no evidence of malignancy in left breast or bilateral lymph nodes.  She proceeded to additional lower-outer right breast biopsy on 11/23/2020. Pathology [id number]) again showed ductal carcinoma in situ, high grade, with necrosis and calcifications. Prognostic panel showing: estrogen receptor 100% positive with strong staining intensity; progesterone receptor 0% negative.  Two additional right breast biopsies were obtained on 11/29/2020. Pathology [id number]) showed: 1. Right Breast, upper central  - ductal carcinoma in situ with necrosis and calcifications 2. 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 Brantley Stage. Pathology from the procedure ([id number]) showed: multifocal intermediate to high grade ductal  carcinoma in situ; carcinoma present at cauterized inferior margin; complex sclerosing lesion.   Cancer Staging  Ductal carcinoma in situ (DCIS)  of right breast Staging form: Breast, AJCC 7th Edition - Pathologic: Stage 0 (Tis (DCIS), N0, cM0) - Signed by Chauncey Cruel, MD on 12/27/2020 Histologic grade (G): G3 Estrogen receptor status: Positive Progesterone receptor status: Negative   The patient's subsequent history is as detailed below.    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   Constipation    uses stool softener PRN- uses once a week to once every 2 weeks    Fibromuscular dysplasia ([place])    Gallbladder disease    Hyperlipidemia    Hypertension    For 16 years; urinary catecholamines within normal limits 12/09; renal Doppler ultrasound showed no evidence for renal artery stenosis   Normal echocardiogram 02/2009   LVEF 65%; no regional wall motion abnormalities; normal RV size and function; pulmonic valve had increased gradient across w/ peak gradient of about 36 mmHg (range of moderate pulmonic stenosis) followup showed normal valve   Papilloma of breast    right   Personal history of radiation therapy    Tinnitus of left ear    Tubular adenoma of colon 10/2013    PAST SURGICAL HISTORY: Past Surgical History:  Procedure Laterality Date   ANGIOPLASTY     2012   BREAST BIOPSY Right 2006   benign   BREAST BIOPSY Right 11/09/2020   BREAST BIOPSY Right 11/23/2020   BREAST BIOPSY Right 11/29/2020   x2   BREAST EXCISIONAL BIOPSY Right 07/2017   benign   BREAST LUMPECTOMY Right 12/16/2020    BREAST LUMPECTOMY WITH RADIOACTIVE SEED LOCALIZATION Right 08/14/2017   Procedure: RIGHT BREAST LUMPECTOMY WITH RADIOACTIVE SEED LOCALIZATION;  Surgeon: [name], MD;  Location: [facility];  Service: General;  Laterality: Right;   BREAST LUMPECTOMY WITH RADIOACTIVE SEED LOCALIZATION Right 12/16/2020   Procedure: RADIOACTIVE SEED GUIDED TIMES 3 RIGHT BREAST LUMPECTOMY;  Surgeon: [name], MD;  Location: [facility];  Service: General;  Laterality: Right;   Cardiolyte  09/2006   Neg   CHOLECYSTECTOMY N/A 09/22/2021   Procedure: LAPAROSCOPIC CHOLECYSTECTOMY WITH  INTRAOPERATIVE CHOLANGIOGRAM;  Surgeon: [name], MD;  Location: [facility];  Service: General;  Laterality: N/A;   COLONOSCOPY     KNEE CARTILAGE SURGERY     right knee   POLYPECTOMY     RE-EXCISION OF BREAST LUMPECTOMY Right 01/11/2021   Procedure: RE-EXCISION OF RIGHT BREAST LUMPECTOMY;  Surgeon: [name], MD;  Location: [place] [place];  Service: General;  Laterality: Right;   TOTAL ABDOMINAL HYSTERECTOMY     no cervix   UPPER GASTROINTESTINAL ENDOSCOPY      FAMILY HISTORY: Family History  Problem Relation Age of Onset   Prostate cancer Father    Lung cancer Father    Colon cancer Maternal Uncle    Colon cancer Paternal Uncle    Colon polyps Sister    Coronary artery disease Paternal Grandmother    Goiter Other        ?   Esophageal cancer Neg Hx    Stomach cancer Neg Hx    Rectal  cancer Neg Hx    Bladder Cancer Neg Hx    Kidney cancer Neg Hx   The patient's father died at the age of 13 from lung cancer.  The patient's mother died from amyotrophic lateral sclerosis at age 65.  The patient has 1 brother and 1 sister.  There is no other family history of cancer to her knowledge   GYNECOLOGIC HISTORY:  No LMP recorded. Patient has had a hysterectomy. Menarche: 82 years old [place] P 0 LMP at the time of hysterectomy in 1993, for fibroids contraceptive no HRT  yes, about 4 years  Hysterectomy? yes BSO?  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  Her husband died from colon cancer at age 63.  At [facility] she lives with Pixie Casino, her significant other, who is a retired Administrator.   ADVANCED DIRECTIVES: Not in place; the patient is considering naming her niece Bayard Males, a retired Marine scientist, as her healthcare power of [place]: Social History   Tobacco Use   Smoking status: Former    Packs/day: 1.00    Years: 15.00    Total pack years: 15.00    Types: Cigarettes    Quit date: 11/20/1982    Years since quitting: 40.0   Smokeless tobacco: Never   Tobacco comments:    15 pack year history  Vaping Use   Vaping Use: Never used  Substance Use Topics   Alcohol use: No   Drug use: No     Colonoscopy: 04/2019 (Dr. Fuller Plan), recall 2022  PAP: Status post hysterectomy  Bone density: 08/2019, -2.0   Allergies  Allergen Reactions   Vibramycin [Doxycycline]     Many years ago - "made me feel funny"    Current Outpatient Medications  Medication Sig Dispense Refill   aspirin EC 81 MG tablet Take 81 mg by mouth daily. Swallow whole.     atorvastatin (LIPITOR) 20 MG tablet Take 1 tablet (20 mg total) by mouth daily. 90 tablet 2   losartan (COZAAR) 50 MG tablet TAKE 1 TABLET BY MOUTH EVERY DAY 90 tablet 2   metoprolol succinate (TOPROL-XL) 100 MG 24 hr tablet TAKE 1 TABLET BY MOUTH DAILY  WITH OR IMMEDIATELY FOLLOWING A  MEAL 90 tablet 3   Multiple Vitamin (MULTIVITAMIN WITH MINERALS) TABS tablet Take 2 tablets by mouth daily.     sodium chloride (OCEAN) 0.65 % SOLN nasal spray Place 1 spray into both nostrils every 4 (four) hours as needed for congestion.     tamoxifen (NOLVADEX) 20 MG tablet Take 1 tablet (20 mg total) by mouth daily. 90 tablet 3   triamterene-hydrochlorothiazide (MAXZIDE-25)  37.5-25 MG tablet TAKE 1 TABLET BY MOUTH DAILY 100 tablet 3   valACYclovir (VALTREX) 500 MG tablet Take 1 tablet (500 mg total) by mouth daily. Take daily for suppressive therapy 30 tablet 11   No current facility-administered medications for this visit.    OBJECTIVE: African-American woman who appears younger than stated age  63:   12/06/22 1526  BP: (!) 145/67  Pulse: 73  Resp: 16  Temp: 98 F (36.7 C)  SpO2: 99%      Body mass index is 32.12 kg/m.   Wt Readings from Last 3 Encounters:  12/06/22 175 lb 9.6 oz (79.7 kg)  11/01/22 171 lb 9.6 oz (77.8 kg)  10/19/22 176 lb 6.4 oz (80 kg)  ECOG FS:1 - Symptomatic but completely ambulatory  Sclerae unicteric, EOMs intact   There is some mild redness of the right breast and some tenderness around the right nipple especially at the outer edge.  No palpable masses.  No regional adenopathy.  LAB RESULTS:  CMP     Component Value Date/Time   NA 137 08/21/2022 1330   K 3.7 08/21/2022 1330   CL 106 08/21/2022 1330   CO2 24 08/21/2022 1330   GLUCOSE 95 08/21/2022 1330   BUN 19 08/21/2022 1330   CREATININE 1.05 (H) 08/21/2022 1330   CALCIUM 10.1 08/21/2022 1330   PROT 6.7 04/04/2022 1058   ALBUMIN 4.0 04/04/2022 1058   AST 18 04/04/2022 1058   ALT 15 04/04/2022 1058   ALKPHOS 30 (L) 04/04/2022 1058   BILITOT 0.4 04/04/2022 1058   GFRNONAA 54 (L) 08/21/2022 1330   GFRAA >60 08/09/2017 1300    No results found for: "TOTALPROTELP", "ALBUMINELP", "A1GS", "A2GS", "BETS", "BETA2SER", "GAMS", "MSPIKE", "SPEI"  Lab Results  Component Value Date   WBC 9.5 08/21/2022   NEUTROABS 2.2 05/22/2022   HGB 13.4 08/21/2022   HCT 40.9 08/21/2022   MCV 100.0 08/21/2022   PLT 337 08/21/2022    No results found for: "LABCA2"  No components found for: "RWERXV400"  No results for input(s): "INR" in the last 168 hours.  No results found for: "LABCA2"  No results found for: "QQP619"  No results found for: "CAN125"  No  results found for: "CAN153"  No results found for: "CA2729"  No components found for: "HGQUANT"  No results found for: "CEA1", "CEA" / No results found for: "CEA1", "CEA"   No results found for: "AFPTUMOR"  No results found for: "CHROMOGRNA"  No results found for: "KPAFRELGTCHN", "LAMBDASER", "KAPLAMBRATIO" (kappa/lambda light chains)  No results found for: "HGBA", "HGBA2QUANT", "HGBFQUANT", "HGBSQUAN" (Hemoglobinopathy evaluation)   No results found for: "LDH"  No results found for: "IRON", "TIBC", "IRONPCTSAT" (Iron and TIBC)  No results found for: "FERRITIN"  Urinalysis    Component Value Date/Time   COLORURINE STRAW (A) 03/02/2017 1320   APPEARANCEUR Clear 08/05/2019 1555   LABSPEC 1.004 (L) 03/02/2017 1320   PHURINE 5.0 03/02/2017 1320   GLUCOSEU Negative 08/05/2019 1555   HGBUR MODERATE (A) 03/02/2017 1320   HGBUR large 11/03/2010 1548   BILIRUBINUR Negative 05/22/2022 1205   BILIRUBINUR Negative 08/05/2019 1555   KETONESUR NEGATIVE 03/02/2017 1320   PROTEINUR Negative 05/22/2022 1205   PROTEINUR Negative 08/05/2019 1555   PROTEINUR NEGATIVE 03/02/2017 1320   UROBILINOGEN 0.2 05/22/2022 1205   UROBILINOGEN 0.2 06/10/2011 1040   NITRITE Negative 05/22/2022 1205   NITRITE Negative 08/05/2019 1555   NITRITE NEGATIVE 03/02/2017 1320   LEUKOCYTESUR Negative 05/22/2022 1205   LEUKOCYTESUR Negative 08/05/2019 1555    STUDIES: No results found.   ELIGIBLE FOR AVAILABLE RESEARCH PROTOCOL:   ASSESSMENT: 82 y.o. [place] woman status post right lumpectomy 12/16/2020, for a multi centric ductal carcinoma in situ, grade 3, with a positive inferior lateral margin  (a) consider additional surgery for margin clearance  (1) adjuvant radiation 02/16/2021 through 03/15/2021 Site Technique Total Dose (Gy) Dose per Fx (Gy) Completed Fx Beam Energies  Breast, Right: Breast_Rt 3D 42.56/42.56 2.66 16/16 6X  Breast, Right: Breast_Rt_Bst 3D 8/8 2 4/4 6X   (2) tamoxifen  started 03/22/2021  (a) status post hysterectomy  (b) bone density 09/15/2019 shows a T score of -2.0   PLAN:  Patient is here for an interim follow-up given her new onset right  breast pain and nipple tenderness which started a few days ago.  This has improved but has not completely resolved. On exam there is some patchy redness of the right breast, no discharge noted.  No palpable mass.  Tenderness at the nipple elicited.  I will give her a week of doxycycline, she tells me that it made her funny many years ago when she took it but is willing to try it again.  She will return to clinic in 1 week.  She understands that she needs to be clear of any infection before proceeding with any surgery.  In the interim she is okay to continue tamoxifen.  She will however have to hold tamoxifen 2 weeks prior to surgery. Total encounter time 30 minutes.* *Total Encounter Time as defined by the Centers for Medicare and Medicaid Services includes, in addition to the face-to-face time of a patient visit (documented in the note above) non-face-to-face time: obtaining and reviewing outside history, ordering and reviewing medications, tests or procedures, care coordination (communications with other health care professionals or caregivers) and documentation in the medical record.

## 2022-12-07 DIAGNOSIS — M25552 Pain in left hip: Secondary | ICD-10-CM | POA: Diagnosis not present

## 2022-12-08 ENCOUNTER — Other Ambulatory Visit: Payer: Self-pay | Admitting: *Deleted

## 2022-12-08 MED ORDER — CEPHALEXIN 500 MG PO CAPS: 500.0000 mg | ORAL_CAPSULE | Freq: Four times a day (QID) | ORAL | 0 refills | Status: DC

## 2022-12-08 NOTE — Progress Notes (Signed)
Pt had c/o nausea and vomiting with Doxycycline. Per Dr. Chryl Heck, sent keflex to pt preferred pharmacy, advised pt to take 4x daily x7 days. Pt verbalized understanding

## 2022-12-10 ENCOUNTER — Ambulatory Visit: Payer: Self-pay | Admitting: Physician Assistant

## 2022-12-10 DIAGNOSIS — G8929 Other chronic pain: Secondary | ICD-10-CM

## 2022-12-10 NOTE — H&P (Signed)
TOTAL KNEE ADMISSION H&P  Patient is being admitted for right total knee arthroplasty.  Subjective:  Chief Complaint:right knee pain.  HPI: Kristy [name], 82 y.o. female, has a history of pain and functional disability in the right knee due to arthritis and has failed non-surgical conservative treatments for greater than 12 weeks to includeNSAID's and/or analgesics, corticosteriod injections, use of assistive devices, and activity modification.  Onset of symptoms was gradual, starting 9 years ago with gradually worsening course since that time. The patient noted no past surgery on the right knee(s).  Patient currently rates pain in the right knee(s) at 9 out of 10 with activity. Patient has night pain, worsening of pain with activity and weight bearing, pain that interferes with activities of daily living, pain with passive range of motion, crepitus, and joint swelling.  Patient has evidence of periarticular osteophytes and joint space narrowing by imaging studies.  There is no active infection.  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   Bradycardia 04/26/2018   Carotid artery stenosis 02/20/2018   Renal artery stenosis (HCC) 01/29/2017   BPPV (benign paroxysmal positional vertigo) 12/17/2015   DDD (degenerative disc disease), lumbar 06/02/2014   Left hip pain 04/17/2014   Lumbar radiculitis 04/17/2014   Lumbar spondylosis 04/17/2014   Other microscopic hematuria 12/16/2013   Allergic rhinitis 10/04/2012   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   Constipation    uses stool softener PRN- uses once a week to once every 2 weeks    Fibromuscular dysplasia ([place])    Gallbladder disease    Hyperlipidemia    Hypertension    For 16 years; urinary catecholamines within normal limits 12/09; renal Doppler ultrasound showed no evidence for renal artery stenosis   Normal echocardiogram 02/2009   LVEF 65%; no regional wall motion abnormalities; normal RV size and function; pulmonic valve had increased gradient across w/ peak gradient of about 36 mmHg (range of moderate pulmonic stenosis) followup showed normal valve   Papilloma of breast    right   Personal history of radiation therapy    Tinnitus of left ear    Tubular adenoma of colon 10/2013    Past Surgical History:  Procedure Laterality Date   ANGIOPLASTY     2012   BREAST BIOPSY Right 2006   benign   BREAST BIOPSY Right 11/09/2020   BREAST BIOPSY Right 11/23/2020   BREAST BIOPSY Right 11/29/2020   x2   BREAST EXCISIONAL BIOPSY Right 07/2017   benign   BREAST LUMPECTOMY Right 12/16/2020   BREAST LUMPECTOMY  WITH RADIOACTIVE SEED LOCALIZATION Right 08/14/2017   Procedure: RIGHT BREAST LUMPECTOMY WITH RADIOACTIVE SEED LOCALIZATION;  Surgeon: [name], MD;  Location: [facility];  Service: General;  Laterality: Right;   BREAST LUMPECTOMY WITH RADIOACTIVE SEED LOCALIZATION Right 12/16/2020   Procedure: RADIOACTIVE SEED GUIDED TIMES 3 RIGHT BREAST LUMPECTOMY;  Surgeon: [name],  MD;  Location: [facility];  Service: General;  Laterality: Right;   [place]  09/2006   Neg   CHOLECYSTECTOMY N/A 09/22/2021   Procedure: LAPAROSCOPIC CHOLECYSTECTOMY WITH  INTRAOPERATIVE CHOLANGIOGRAM;  Surgeon: [name], MD;  Location: [facility];  Service: General;  Laterality: N/A;   COLONOSCOPY     KNEE CARTILAGE SURGERY     right knee   POLYPECTOMY     RE-EXCISION OF BREAST LUMPECTOMY Right 01/11/2021   Procedure: RE-EXCISION OF RIGHT BREAST LUMPECTOMY;  Surgeon: [name], MD;  Location: [facility];  Service: General;  Laterality: Right;   TOTAL ABDOMINAL HYSTERECTOMY     no cervix   UPPER GASTROINTESTINAL ENDOSCOPY      Current Outpatient Medications  Medication Sig Dispense Refill Last Dose   aspirin EC 81 MG tablet Take 81 mg by mouth daily. Swallow whole.      atorvastatin (LIPITOR) 20 MG tablet Take 1 tablet (20 mg total) by mouth daily. 90 tablet 2    cephALEXin (KEFLEX) 500 MG capsule Take 1 capsule (500 mg total) by mouth 4 (four) times daily for 7 days. 28 capsule 0    doxycycline (VIBRA-TABS) 100 MG tablet Take 1 tablet (100 mg total) by mouth 2 (two) times daily. 14 tablet 0    losartan (COZAAR) 50 MG tablet TAKE 1 TABLET BY MOUTH EVERY DAY 90 tablet 2    metoprolol succinate (TOPROL-XL) 100 MG 24 hr tablet TAKE 1 TABLET BY MOUTH DAILY  WITH OR IMMEDIATELY FOLLOWING A  MEAL 90 tablet 3    Multiple Vitamin (MULTIVITAMIN WITH MINERALS) TABS tablet Take 2 tablets by mouth daily.      sodium chloride (OCEAN) 0.65 % SOLN nasal spray Place 1 spray into both nostrils every 4 (four) hours as needed for congestion.      tamoxifen (NOLVADEX) 20 MG tablet Take 1 tablet (20 mg total) by mouth daily. 90 tablet 3    triamterene-hydrochlorothiazide (MAXZIDE-25) 37.5-25 MG tablet TAKE 1 TABLET BY MOUTH DAILY 100 tablet 3    valACYclovir (VALTREX) 500 MG tablet Take 1 tablet (500 mg total) by mouth daily. Take daily for suppressive therapy 30 tablet  11    No current facility-administered medications for this visit.   Allergies  Allergen Reactions   Vibramycin [Doxycycline]     Many years ago - "made me feel funny"    Social History   Tobacco Use   Smoking status: Former    Packs/day: 1.00    Years: 15.00    Total pack years: 15.00    Types: Cigarettes    Quit date: 11/20/1982    Years since quitting: 40.0   Smokeless tobacco: Never   Tobacco comments:    15 pack year history  Substance Use Topics   Alcohol use: No    Family History  Problem Relation Age of Onset   Prostate cancer Father    Lung cancer Father    Colon cancer Maternal Uncle    Colon cancer Paternal Uncle    Colon polyps Sister    Coronary artery disease Paternal Grandmother    Goiter Other        ?  Esophageal cancer Neg Hx    Stomach cancer Neg Hx    Rectal cancer Neg Hx    Bladder Cancer Neg Hx    Kidney cancer Neg Hx      Review of Systems  HENT:  Positive for tinnitus.   Genitourinary:  Positive for hematuria.  Musculoskeletal:  Positive for arthralgias.  All other systems reviewed and are negative.   Objective:  Physical Exam Constitutional:      General: She is not in acute distress.    Appearance: Normal appearance.  HENT:     Head: Normocephalic and atraumatic.  Eyes:     Extraocular Movements: Extraocular movements intact.     Pupils: Pupils are equal, round, and reactive to light.  Cardiovascular:     Rate and Rhythm: Normal rate and regular rhythm.     Pulses: Normal pulses.     Heart sounds: Normal heart sounds.  Pulmonary:     Effort: Pulmonary effort is normal. No respiratory distress.     Breath sounds: Normal breath sounds. No wheezing.  Abdominal:     General: Abdomen is flat. Bowel sounds are normal. There is no distension.     Palpations: Abdomen is soft.     Tenderness: There is no abdominal tenderness.  Musculoskeletal:     Cervical back: Normal range of motion and neck supple.     Right knee: Swelling  and bony tenderness present. No effusion or erythema. Tenderness present.  Lymphadenopathy:     Cervical: No cervical adenopathy.  Skin:    General: Skin is warm and dry.     Findings: No erythema or rash.  Neurological:     General: No focal deficit present.     Mental Status: She is alert and oriented to person, place, and time.  Psychiatric:        Mood and Affect: Mood normal.        Behavior: Behavior normal.     Vital signs in last 24 hours: '@VSRANGES'$ @  Labs:   Estimated body mass index is 32.12 kg/m as calculated from the following:   Height as of 12/06/22: '5\' 2"'$  (1.575 m).   Weight as of 12/06/22: 79.7 kg.   Imaging Review Plain radiographs demonstrate moderate degenerative joint disease of the right knee(s). The overall alignment issignificant valgus. The bone quality appears to be good for age and reported activity level.      Assessment/Plan:  End stage arthritis, right knee   The patient history, physical examination, clinical judgment of the provider and imaging studies are consistent with end stage degenerative joint disease of the right knee(s) and total knee arthroplasty is deemed medically necessary. The treatment options including medical management, injection therapy arthroscopy and arthroplasty were discussed at length. The risks and benefits of total knee arthroplasty were presented and reviewed. The risks due to aseptic loosening, infection, stiffness, patella tracking problems, thromboembolic complications and other imponderables were discussed. The patient acknowledged the explanation, agreed to proceed with the plan and consent was signed. Patient is being admitted for inpatient treatment for surgery, pain control, PT, OT, prophylactic antibiotics, VTE prophylaxis, progressive ambulation and ADL's and discharge planning. The patient is planning to be discharged home with home health services    Anticipated LOS equal to or greater than 2 midnights due  to - Age 65 and older with one or more of the following:  - Obesity  - Expected need for hospital services (PT, OT, Nursing) required for safe  discharge  - Anticipated  need for postoperative skilled nursing care or inpatient rehab  - Active co-morbidities: Diabetes OR   - Unanticipated findings during/Post Surgery: None  - Patient is a high risk of re-admission due to: None

## 2022-12-11 ENCOUNTER — Telehealth: Payer: Self-pay | Admitting: *Deleted

## 2022-12-11 NOTE — Telephone Encounter (Signed)
Spoke with pt and was informed that pt is taking  Cephalexin 500 mg  BID only.  Pt stated she will continue to take BID until her next office visit on  12/13/22.

## 2022-12-11 NOTE — Telephone Encounter (Signed)
Pt called stating that she was prescribed Cephalexin 500 mg  QID by Dr. Chryl Heck.  Stated she could not tolerate this high dose - making her vomited.  Pt did not understand why she has to take this antibiotic -  " how bad was the infection and where ". Pt would like to talk to MD or nurse about this issue.

## 2022-12-13 ENCOUNTER — Inpatient Hospital Stay (HOSPITAL_BASED_OUTPATIENT_CLINIC_OR_DEPARTMENT_OTHER): Payer: Medicare Other | Admitting: Hematology and Oncology

## 2022-12-13 VITALS — BP 143/71 | HR 73 | Temp 97.8°F | Resp 16 | Ht 62.0 in | Wt 176.5 lb

## 2022-12-13 DIAGNOSIS — Z87891 Personal history of nicotine dependence: Secondary | ICD-10-CM | POA: Diagnosis not present

## 2022-12-13 DIAGNOSIS — N61 Mastitis without abscess: Secondary | ICD-10-CM

## 2022-12-13 DIAGNOSIS — N6489 Other specified disorders of breast: Secondary | ICD-10-CM | POA: Diagnosis not present

## 2022-12-13 DIAGNOSIS — M818 Other osteoporosis without current pathological fracture: Secondary | ICD-10-CM

## 2022-12-13 DIAGNOSIS — Z800 Family history of malignant neoplasm of digestive organs: Secondary | ICD-10-CM | POA: Diagnosis not present

## 2022-12-13 DIAGNOSIS — D0511 Intraductal carcinoma in situ of right breast: Secondary | ICD-10-CM

## 2022-12-13 DIAGNOSIS — I10 Essential (primary) hypertension: Secondary | ICD-10-CM | POA: Diagnosis not present

## 2022-12-13 DIAGNOSIS — Z86010 Personal history of colonic polyps: Secondary | ICD-10-CM | POA: Diagnosis not present

## 2022-12-13 DIAGNOSIS — E785 Hyperlipidemia, unspecified: Secondary | ICD-10-CM | POA: Diagnosis not present

## 2022-12-13 DIAGNOSIS — Z170 Estrogen receptor positive status [ER+]: Secondary | ICD-10-CM | POA: Diagnosis not present

## 2022-12-13 DIAGNOSIS — Z9049 Acquired absence of other specified parts of digestive tract: Secondary | ICD-10-CM | POA: Diagnosis not present

## 2022-12-13 DIAGNOSIS — Z8249 Family history of ischemic heart disease and other diseases of the circulatory system: Secondary | ICD-10-CM | POA: Diagnosis not present

## 2022-12-13 DIAGNOSIS — Z8349 Family history of other endocrine, nutritional and metabolic diseases: Secondary | ICD-10-CM | POA: Diagnosis not present

## 2022-12-13 DIAGNOSIS — Z923 Personal history of irradiation: Secondary | ICD-10-CM | POA: Diagnosis not present

## 2022-12-13 DIAGNOSIS — Z881 Allergy status to other antibiotic agents status: Secondary | ICD-10-CM | POA: Diagnosis not present

## 2022-12-13 DIAGNOSIS — Z79899 Other long term (current) drug therapy: Secondary | ICD-10-CM | POA: Diagnosis not present

## 2022-12-13 DIAGNOSIS — Z79624 Long term (current) use of inhibitors of nucleotide synthesis: Secondary | ICD-10-CM | POA: Diagnosis not present

## 2022-12-13 DIAGNOSIS — Z801 Family history of malignant neoplasm of trachea, bronchus and lung: Secondary | ICD-10-CM | POA: Diagnosis not present

## 2022-12-13 DIAGNOSIS — Z83719 Family history of colon polyps, unspecified: Secondary | ICD-10-CM | POA: Diagnosis not present

## 2022-12-13 MED ORDER — CEFADROXIL 500 MG PO CAPS: 500.0000 mg | ORAL_CAPSULE | Freq: Two times a day (BID) | ORAL | 0 refills | Status: DC

## 2022-12-13 NOTE — Progress Notes (Signed)
[place]  Telephone:(336) [phone] Fax:(336) [phone]    ID: Kristy [name] DOB: 07-20-1941  MR#: [id number]  HKV#:[id number]  Patient Care Team: Jinny Sanders, MD as PCP - General [name], MD as Consulting Physician (General Surgery) Kyung Rudd, MD as Consulting Physician (Radiation Oncology) Ladene Artist, MD as Consulting Physician (Gastroenterology) Mauro Kaufmann, RN as Oncology Nurse Navigator Rockwell Germany, RN as Oncology Nurse Navigator Charlton Haws, [facility] as Pharmacist (Pharmacist) Benay Pike, MD as Consulting Physician (Hematology and Oncology) Benay Pike, MD  CHIEF COMPLAINT: noninvasive breast cancer, estrogen receptor positive  CURRENT TREATMENT: Tamoxifen  INTERVAL HISTORY:  Kristy [name] returns today for follow up of her noninvasive breast cancer.  She started tamoxifen on 03/22/2021.  She is tolerating tamoxifen well.  She came for an interim visit last week with breast pain and tenderness of the right breast.  She is now back for follow-up.  She was not able to tolerate doxycycline hence recent Keflex.  She apparently was not able to tolerate Keflex as well because of nausea and was only taking it twice a day.  No fevers or chills.  She initially was planning to have knee replacement on February 9 but she apparently canceled it since she is relatively immobile and she wants to try another intervention before considering knee replacement. Most recent bone density showed T score of -2.5 showed osteoporosis.   COVID 19 VACCINATION STATUS: fully vaccinated [employer]), with booster 08/2020   HISTORY OF CURRENT ILLNESS: From the original intake note:  Kristy WIEDEL has a history of right breast lumpectomy on 08/14/2017 under Dr. Brantley Stage for a tubular adenoma with calcifications.  More recently, she had routine screening mammography on 10/11/2020 showing a possible abnormality in the right breast. She underwent right  diagnostic mammography with tomography at [facility] on 10/29/2020 showing: breast density category C; indeterminate 1.1 cm group of fine pleomorphic calcifications in lower-outer right breast.  Accordingly on 11/09/2020 she proceeded to biopsy of the right breast area in question. The pathology from this procedure ([id number]) showed: ductal carcinoma in situ, high grade, with necrosis and calcifications; complex sclerosing lesion. Prognostic indicators significant for: estrogen receptor, 95% positive with strong staining intensity and progesterone receptor, 0% negative.   She underwent breast MRI on 11/15/2020 showing: breast composition C; enhancement around a small hematoma and biopsy tract in lower-outer right breast consistent with patient's known malignancy; indeterminate 6 mm enhancing mass with suspicious kinetics, just superior and posterior to biopsy site in lower-outer right breast; indeterminate 1.8 cm area of linear non-mass enhancement in superior central right breast; no evidence of malignancy in left breast or bilateral lymph nodes.  She proceeded to additional lower-outer right breast biopsy on 11/23/2020. Pathology [id number]) again showed ductal carcinoma in situ, high grade, with necrosis and calcifications. Prognostic panel showing: estrogen receptor 100% positive with strong staining intensity; progesterone receptor 0% negative.  Two additional right breast biopsies were obtained on 11/29/2020. Pathology [id number]) showed: 1. Right Breast, upper central  - ductal carcinoma in situ with necrosis and calcifications 2. 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 Brantley Stage. Pathology from the procedure ([id number]) showed: multifocal intermediate to high grade ductal  carcinoma in situ; carcinoma present at cauterized inferior margin; complex sclerosing lesion.   Cancer Staging  Ductal  carcinoma in situ (DCIS) of right breast Staging form: Breast, AJCC 7th Edition - Pathologic: Stage 0 (Tis (DCIS), N0, cM0) - Signed by Chauncey Cruel, MD on 12/27/2020 Histologic grade (G): G3 Estrogen receptor status: Positive Progesterone receptor status: Negative   The patient's subsequent history is as detailed below.    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   Constipation    uses stool softener PRN- uses once a week to once every 2 weeks    Fibromuscular dysplasia ([place])    Gallbladder disease    Hyperlipidemia    Hypertension    For 16 years; urinary catecholamines within normal limits 12/09; renal Doppler ultrasound showed no evidence for renal artery stenosis   Normal echocardiogram 02/2009   LVEF 65%; no regional wall motion abnormalities; normal RV size and function; pulmonic valve had increased gradient across w/ peak gradient of about 36 mmHg (range of moderate pulmonic stenosis) followup showed normal valve   Papilloma of breast    right   Personal history of radiation therapy    Tinnitus of left ear    Tubular adenoma of colon 10/2013    PAST SURGICAL HISTORY: Past Surgical History:  Procedure Laterality Date   ANGIOPLASTY     2012   BREAST BIOPSY Right 2006   benign   BREAST BIOPSY Right 11/09/2020   BREAST BIOPSY Right 11/23/2020   BREAST BIOPSY Right 11/29/2020   x2   BREAST EXCISIONAL BIOPSY Right 07/2017   benign   BREAST LUMPECTOMY Right 12/16/2020    BREAST LUMPECTOMY WITH RADIOACTIVE SEED LOCALIZATION Right 08/14/2017   Procedure: RIGHT BREAST LUMPECTOMY WITH RADIOACTIVE SEED LOCALIZATION;  Surgeon: [name], MD;  Location: [facility];  Service: General;  Laterality: Right;   BREAST LUMPECTOMY WITH RADIOACTIVE SEED LOCALIZATION Right 12/16/2020   Procedure: RADIOACTIVE SEED GUIDED TIMES 3 RIGHT BREAST LUMPECTOMY;  Surgeon: [name], MD;  Location: [facility];  Service: General;  Laterality: Right;   Cardiolyte  09/2006   Neg   CHOLECYSTECTOMY N/A 09/22/2021   Procedure: LAPAROSCOPIC CHOLECYSTECTOMY WITH  INTRAOPERATIVE CHOLANGIOGRAM;  Surgeon: [name], MD;  Location: [facility];  Service: General;  Laterality: N/A;   COLONOSCOPY     KNEE CARTILAGE SURGERY     right knee   POLYPECTOMY     RE-EXCISION OF BREAST LUMPECTOMY Right 01/11/2021   Procedure: RE-EXCISION OF RIGHT BREAST LUMPECTOMY;  Surgeon: [name], MD;  Location: [facility];  Service: General;  Laterality: Right;   TOTAL ABDOMINAL HYSTERECTOMY     no cervix   UPPER GASTROINTESTINAL ENDOSCOPY      FAMILY HISTORY: Family History  Problem Relation Age of Onset   Prostate cancer Father    Lung cancer Father    Colon cancer Maternal Uncle    Colon cancer Paternal Uncle    Colon polyps Sister    Coronary artery disease Paternal Grandmother    Goiter Other        ?   Esophageal cancer Neg Hx    Stomach cancer Neg Hx  Rectal cancer Neg Hx    Bladder Cancer Neg Hx    Kidney cancer Neg Hx   The patient's father died at the age of 71 from lung cancer.  The patient's mother died from amyotrophic lateral sclerosis at age 79.  The patient has 1 brother and 1 sister.  There is no other family history of cancer to her knowledge   GYNECOLOGIC HISTORY:  No LMP recorded. Patient has had a hysterectomy. Menarche: 82 years old [place] P 0 LMP at the time of hysterectomy in 1993, for fibroids contraceptive no HRT  yes, about 4 years  Hysterectomy? yes BSO?  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  Her husband died from colon cancer at age 56.  At [facility] she lives with Pixie Casino, her significant other, who is a retired Administrator.   ADVANCED DIRECTIVES: Not in place; the patient is considering naming her niece Bayard Males, a retired Marine scientist, as her healthcare power of [place]: Social History   Tobacco Use   Smoking status: Former    Packs/day: 1.00    Years: 15.00    Total pack years: 15.00    Types: Cigarettes    Quit date: 11/20/1982    Years since quitting: 40.0   Smokeless tobacco: Never   Tobacco comments:    15 pack year history  Vaping Use   Vaping Use: Never used  Substance Use Topics   Alcohol use: No   Drug use: No     Colonoscopy: 04/2019 (Dr. Fuller Plan), recall 2022  PAP: Status post hysterectomy  Bone density: 08/2019, -2.0   Allergies  Allergen Reactions   Vibramycin [Doxycycline]     Many years ago - "made me feel funny"    Current Outpatient Medications  Medication Sig Dispense Refill   cefadroxil (DURICEF) 500 MG capsule Take 1 capsule (500 mg total) by mouth 2 (two) times daily. 10 capsule 0   aspirin EC 81 MG tablet Take 81 mg by mouth daily. Swallow whole.     atorvastatin (LIPITOR) 20 MG tablet Take 1 tablet (20 mg total) by mouth daily. 90 tablet 2   losartan (COZAAR) 50 MG tablet TAKE 1 TABLET BY MOUTH EVERY DAY 90 tablet 2   metoprolol succinate (TOPROL-XL) 100 MG 24 hr tablet TAKE 1 TABLET BY MOUTH DAILY  WITH OR IMMEDIATELY FOLLOWING A  MEAL 90 tablet 3   Multiple Vitamin (MULTIVITAMIN WITH MINERALS) TABS tablet Take 2 tablets by mouth daily.     sodium chloride (OCEAN) 0.65 % SOLN nasal spray Place 1 spray into both nostrils every 4 (four) hours as needed for congestion.     tamoxifen (NOLVADEX) 20  MG tablet Take 1 tablet (20 mg total) by mouth daily. 90 tablet 3   triamterene-hydrochlorothiazide (MAXZIDE-25) 37.5-25 MG tablet TAKE 1 TABLET BY MOUTH DAILY 100 tablet 3   valACYclovir (VALTREX) 500 MG tablet Take 1 tablet (500 mg total) by mouth daily. Take daily for suppressive therapy 30 tablet 11   No current facility-administered medications for this visit.    OBJECTIVE: African-American woman who appears younger than stated age  49:   12/13/22 1527  BP: (!) 143/71  Pulse: 73  Resp: 16  Temp: 97.8 F (36.6 C)  SpO2: 100%      Body mass index is 32.28 kg/m.   Wt Readings from Last 3 Encounters:  12/13/22  176 lb 8 oz (80.1 kg)  12/06/22 175 lb 9.6 oz (79.7 kg)  11/01/22 171 lb 9.6 oz (77.8 kg)     ECOG FS:1 - Symptomatic but completely ambulatory  Sclerae unicteric, EOMs intact   Right breast erythema has significantly improved compared to last week.  No palpable masses.  Architectural distortion from prior surgery noted.  LAB RESULTS:  CMP     Component Value Date/Time   NA 137 08/21/2022 1330   K 3.7 08/21/2022 1330   CL 106 08/21/2022 1330   CO2 24 08/21/2022 1330   GLUCOSE 95 08/21/2022 1330   BUN 19 08/21/2022 1330   CREATININE 1.05 (H) 08/21/2022 1330   CALCIUM 10.1 08/21/2022 1330   PROT 6.7 04/04/2022 1058   ALBUMIN 4.0 04/04/2022 1058   AST 18 04/04/2022 1058   ALT 15 04/04/2022 1058   ALKPHOS 30 (L) 04/04/2022 1058   BILITOT 0.4 04/04/2022 1058   GFRNONAA 54 (L) 08/21/2022 1330   GFRAA >60 08/09/2017 1300    No results found for: "TOTALPROTELP", "ALBUMINELP", "A1GS", "A2GS", "BETS", "BETA2SER", "GAMS", "MSPIKE", "SPEI"  Lab Results  Component Value Date   WBC 9.5 08/21/2022   NEUTROABS 2.2 05/22/2022   HGB 13.4 08/21/2022   HCT 40.9 08/21/2022   MCV 100.0 08/21/2022   PLT 337 08/21/2022    No results found for: "LABCA2"  No components found for: "SHFWYO378"  No results for input(s): "INR" in the last 168 hours.  No results  found for: "LABCA2"  No results found for: "HYI502"  No results found for: "CAN125"  No results found for: "CAN153"  No results found for: "CA2729"  No components found for: "HGQUANT"  No results found for: "CEA1", "CEA" / No results found for: "CEA1", "CEA"   No results found for: "AFPTUMOR"  No results found for: "CHROMOGRNA"  No results found for: "KPAFRELGTCHN", "LAMBDASER", "KAPLAMBRATIO" (kappa/lambda light chains)  No results found for: "HGBA", "HGBA2QUANT", "HGBFQUANT", "HGBSQUAN" (Hemoglobinopathy evaluation)   No results found for: "LDH"  No results found for: "IRON", "TIBC", "IRONPCTSAT" (Iron and TIBC)  No results found for: "FERRITIN"  Urinalysis    Component Value Date/Time   COLORURINE STRAW (A) 03/02/2017 1320   APPEARANCEUR Clear 08/05/2019 1555   LABSPEC 1.004 (L) 03/02/2017 1320   PHURINE 5.0 03/02/2017 1320   GLUCOSEU Negative 08/05/2019 1555   HGBUR MODERATE (A) 03/02/2017 1320   HGBUR large 11/03/2010 1548   BILIRUBINUR Negative 05/22/2022 1205   BILIRUBINUR Negative 08/05/2019 1555   KETONESUR NEGATIVE 03/02/2017 1320   PROTEINUR Negative 05/22/2022 1205   PROTEINUR Negative 08/05/2019 1555   PROTEINUR NEGATIVE 03/02/2017 1320   UROBILINOGEN 0.2 05/22/2022 1205   UROBILINOGEN 0.2 06/10/2011 1040   NITRITE Negative 05/22/2022 1205   NITRITE Negative 08/05/2019 1555   NITRITE NEGATIVE 03/02/2017 1320   LEUKOCYTESUR Negative 05/22/2022 1205   LEUKOCYTESUR Negative 08/05/2019 1555    STUDIES: No results found.   ELIGIBLE FOR AVAILABLE RESEARCH PROTOCOL:   ASSESSMENT: 82 y.o. [place] woman status post right lumpectomy 12/16/2020, for a multi centric ductal carcinoma in situ, grade 3, with a positive inferior lateral margin  (a) consider additional surgery for margin clearance  (1) adjuvant radiation 02/16/2021 through 03/15/2021 Site Technique Total Dose (Gy) Dose per Fx (Gy) Completed Fx Beam Energies  Breast, Right: Breast_Rt 3D  42.56/42.56 2.66 16/16 6X  Breast, Right: Breast_Rt_Bst 3D 8/8 2 4/4 6X   (2) tamoxifen started 03/22/2021  (a) status post hysterectomy  (b) bone density 09/15/2019 shows a T score  of -2.0   PLAN:  Patient is here for an interim follow-up given her new onset right breast pain and nipple tenderness which started a few days ago.   We treated her for cellulitis, gave her some doxycycline with which she had nausea and vomiting hence changed this to Keflex.  She apparently could not take it 4 times a day and was only taking it twice a day.  Right breast erythema and tenderness has improved compared to her last week's visit.  Since she has not completed her antibiotics as recommended, I have changed the antibiotic to cefadroxil twice a day for 5 days. Since she canceled her knee replacement, we will discard the preop clearance paperwork was sent to [place] She was encouraged to reach out back to [place] if she has ongoing issues with the breast.  She otherwise can return to clinic with [place] as planned in December 2024 With regards to osteoporosis, I encouraged her to do some weightbearing exercises and she expressed understanding.   Total encounter time 30 minutes.* *Total Encounter Time as defined by the Centers for Medicare and Medicaid Services includes, in addition to the face-to-face time of a patient visit (documented in the note above) non-face-to-face time: obtaining and reviewing outside history, ordering and reviewing medications, tests or procedures, care coordination (communications with other health care professionals or caregivers) and documentation in the medical record.

## 2022-12-14 ENCOUNTER — Telehealth: Payer: Self-pay | Admitting: Family Medicine

## 2022-12-14 NOTE — Telephone Encounter (Signed)
Kristy [name] notified as instructed by telephone.  Patient states understanding.

## 2022-12-14 NOTE — Telephone Encounter (Signed)
Patient called and stated she wanted to know if she can take collegian supplement with her blood pressure medication and also she has opted out of knee replacement surgery. Call back number [phone].

## 2022-12-14 NOTE — Telephone Encounter (Signed)
It would be fine for her to use a collagen containing supplement and/or glucosamine 500 mg 1-3 times daily

## 2022-12-20 ENCOUNTER — Encounter (HOSPITAL_COMMUNITY): Admission: RE | Admit: 2022-12-20 | Payer: Medicare Other | Source: Ambulatory Visit

## 2022-12-21 ENCOUNTER — Telehealth: Payer: Self-pay

## 2022-12-21 NOTE — Progress Notes (Signed)
Care Management & Coordination Services Pharmacy Team  Reason for Encounter: General adherence update   Contacted patient for general health update and medication adherence call.   Attempted contact with patient 3 times. Unsuccessful outreach.   Chart Updates:  Recent office visits:  12/14/22 Eliezer Lofts, MD Telephone: "fine for her to use a collagen containing supplement and/or glucosamine 500 mg 1-3 times daily" 10/19/22 Eliezer Lofts, MD Osteoporosis Stop (completed): Naphazoline HCI   Recent consult visits:  12/13/22 Staci Acosta, MD (Oncology) Ductal carcinoma in situ of right breast Start: Cefadroxil 500 mg Stop: Cephalexin 500 mg Stop: Doxycycline 100 mg 12/06/22 Staci Acosta, MD (Oncology) Ductal carcinoma in situ of right breast Start: Doxycycline 100 mg F/U 1 week 11/01/22 Staci Acosta, MD (Oncology) Ductal carcinoma in situ of right breast No med changes F/U 1 year  Hospital visits:  None in previous 6 months  Medications: Outpatient Encounter Medications as of 12/21/2022  Medication Sig   aspirin EC 81 MG tablet Take 81 mg by mouth daily. Swallow whole.   atorvastatin (LIPITOR) 20 MG tablet Take 1 tablet (20 mg total) by mouth daily.   cefadroxil (DURICEF) 500 MG capsule Take 1 capsule (500 mg total) by mouth 2 (two) times daily.   losartan (COZAAR) 50 MG tablet TAKE 1 TABLET BY MOUTH EVERY DAY   metoprolol succinate (TOPROL-XL) 100 MG 24 hr tablet TAKE 1 TABLET BY MOUTH DAILY  WITH OR IMMEDIATELY FOLLOWING A  MEAL   Multiple Vitamin (MULTIVITAMIN WITH MINERALS) TABS tablet Take 2 tablets by mouth daily.   sodium chloride (OCEAN) 0.65 % SOLN nasal spray Place 1 spray into both nostrils every 4 (four) hours as needed for congestion.   tamoxifen (NOLVADEX) 20 MG tablet Take 1 tablet (20 mg total) by mouth daily.   triamterene-hydrochlorothiazide (MAXZIDE-25) 37.5-25 MG tablet TAKE 1 TABLET BY MOUTH DAILY   valACYclovir (VALTREX) 500 MG tablet Take 1 tablet (500 mg  total) by mouth daily. Take daily for suppressive therapy   No facility-administered encounter medications on file as of 12/21/2022.    Recent vitals BP Readings from Last 3 Encounters:  12/13/22 (!) 143/71  12/06/22 (!) 145/67  11/01/22 (!) 146/74   Pulse Readings from Last 3 Encounters:  12/13/22 73  12/06/22 73  11/01/22 80   Wt Readings from Last 3 Encounters:  12/13/22 176 lb 8 oz (80.1 kg)  12/06/22 175 lb 9.6 oz (79.7 kg)  11/01/22 171 lb 9.6 oz (77.8 kg)   BMI Readings from Last 3 Encounters:  12/13/22 32.28 kg/m  12/06/22 32.12 kg/m  11/01/22 31.39 kg/m    Recent lab results    Component Value Date/Time   NA 137 08/21/2022 1330   K 3.7 08/21/2022 1330   CL 106 08/21/2022 1330   CO2 24 08/21/2022 1330   GLUCOSE 95 08/21/2022 1330   BUN 19 08/21/2022 1330   CREATININE 1.05 (H) 08/21/2022 1330   CALCIUM 10.1 08/21/2022 1330    Lab Results  Component Value Date   CREATININE 1.05 (H) 08/21/2022   GFR 71.37 05/22/2022   GFRNONAA 54 (L) 08/21/2022   GFRAA >60 08/09/2017   Lab Results  Component Value Date/Time   HGBA1C 6.1 04/04/2022 10:58 AM   HGBA1C 6.1 03/25/2021 11:59 AM    Lab Results  Component Value Date   CHOL 99 04/04/2022   HDL 46.20 04/04/2022   LDLCALC 39 04/04/2022   LDLDIRECT 140.3 03/26/2013   TRIG 70.0 04/04/2022   CHOLHDL 2 04/04/2022    Care  Gaps: Annual wellness visit in last year? Yes 03/13/22  Star Rating Drugs:  Medication:  Last Fill: Day Supply Atorvastatin 20 mg 12/04/22 90  Losartan 50 mg 12/12/22 [place], PharmD notified  Marijean Niemann, [facility] Pharmacy Assistant [phone]

## 2022-12-27 ENCOUNTER — Inpatient Hospital Stay: Payer: Medicare Other | Attending: Hematology and Oncology | Admitting: Dietician

## 2022-12-29 ENCOUNTER — Ambulatory Visit: Admit: 2022-12-29 | Payer: Medicare Other | Admitting: Orthopedic Surgery

## 2022-12-29 SURGERY — ARTHROPLASTY, KNEE, TOTAL
Anesthesia: Choice | Site: Knee | Laterality: Right

## 2023-01-11 DIAGNOSIS — M1711 Unilateral primary osteoarthritis, right knee: Secondary | ICD-10-CM | POA: Diagnosis not present

## 2023-01-18 DIAGNOSIS — M1711 Unilateral primary osteoarthritis, right knee: Secondary | ICD-10-CM | POA: Diagnosis not present

## 2023-01-25 DIAGNOSIS — M1711 Unilateral primary osteoarthritis, right knee: Secondary | ICD-10-CM | POA: Diagnosis not present

## 2023-01-25 DIAGNOSIS — M25562 Pain in left knee: Secondary | ICD-10-CM | POA: Diagnosis not present

## 2023-02-02 ENCOUNTER — Ambulatory Visit (INDEPENDENT_AMBULATORY_CARE_PROVIDER_SITE_OTHER): Payer: Medicare Other | Admitting: Family Medicine

## 2023-02-02 ENCOUNTER — Telehealth: Payer: Self-pay | Admitting: Family Medicine

## 2023-02-02 ENCOUNTER — Encounter: Payer: Self-pay | Admitting: Family Medicine

## 2023-02-02 VITALS — BP 120/60 | HR 69 | Temp 97.9°F | Ht 62.0 in | Wt 174.5 lb

## 2023-02-02 DIAGNOSIS — K529 Noninfective gastroenteritis and colitis, unspecified: Secondary | ICD-10-CM | POA: Insufficient documentation

## 2023-02-02 DIAGNOSIS — J0100 Acute maxillary sinusitis, unspecified: Secondary | ICD-10-CM | POA: Diagnosis not present

## 2023-02-02 MED ORDER — CHOLESTYRAMINE LIGHT 4 G PO PACK: 4.0000 g | PACK | Freq: Two times a day (BID) | ORAL | 0 refills | Status: DC

## 2023-02-02 NOTE — Assessment & Plan Note (Signed)
Acute, likely viral or allergic sinusitis.  Recent COVID testing negative but done on day 3 of worsening symptoms.  She will retest this in 2 more days to assure true negative. Her symptoms have been going on for the past several months and so are more likely allergic in nature.  I have recommended for her to treat with Flonase 2 sprays per nostril daily as well as starting Xyzal or Zyrtec at bedtime. Considered treatment with antibiotic course, but she has recently completed several different antibiotic courses for breast infection including a cephalosporin which should have treated any bacterial sinus infection.  Return and ER precautions provided

## 2023-02-02 NOTE — Patient Instructions (Addendum)
Start flonase 2 spray per nostril daily.  Start Xyzal or Zyrtec at bedtime.  Continue nasal saline.  Call if not improving over the next 1-2 weeks.   Can try a trial of cholestyramine for gallbladder surgery related diarrhea.

## 2023-02-02 NOTE — Assessment & Plan Note (Signed)
Chronic, no signs and symptoms of infection. Symptoms have been going on for greater than a year and a half starting after her gallbladder surgery.  She states it is significantly worse with fatty diet but she has tried to avoid this and still has intermittent issues with things like chicken sausage.  I have provided a prescription for her to try of cholestyramine 2-3 times daily.

## 2023-02-02 NOTE — Telephone Encounter (Signed)
I spoke with pt; pt has H/A between her eyes and rt side of face feels sore; pt has runny nose and when blows nose has yellow mucus. Pt has dry cough. ? Fever; pt felt warm one day but did not take temp. No wheezing, CP, SOB or S/t. Pt scheduled appt with Dr Diona Browner 02/02/23 at 3 PM with UC & ED precautions pt said she wants med to clear symptoms. Sending note to Dr Diona Browner and [facility].

## 2023-02-02 NOTE — Telephone Encounter (Signed)
Patient called in and was wanting to know if there is something she can take OTC for a sinus infection. Please advise. Thank you!

## 2023-02-02 NOTE — Progress Notes (Signed)
Patient ID: Kristy [name], female    DOB: [birth year], 82 y.o.   MRN: [id number]  This visit was conducted in person.  BP 120/60   Pulse 69   Temp 97.9 F (36.6 C) (Temporal)   Ht 5\' 2"  (1.575 m)   Wt 174 lb 8 oz (79.2 kg)   SpO2 97%   BMI 31.92 kg/m    CC:  Chief Complaint  Patient presents with   Headache   Cough    Dry Cough   Facial Pain    Subjective:   HPI: Kristy [name] is a 82 y.o. female presenting on 02/02/2023 for Headache, Cough (Dry Cough), and Facial Pain    Date of onset:  3 days   All winter she has had intermittent runny nose, frontal headache Initial symptoms included  congestion Symptoms progressed to increased facial pain.  More tired.  No fever.  No ear pain, no ST.  No sob, no wheeze.    Since her gallbladder surgery.. she has loose stool.. feel this tires her out... 4-5 BMs a day. Mainly with fired foods. Imodium helps minimally.    Sick contacts:  none COVID testing:   negative     She has tried to treat with nasal saline  Has not tried allergy meds or flonase.      No history of chronic lung disease such as asthma or COPD. Non-smoker.   Just completed a course of cephalosporin x 5 days for breast infeciton.     Relevant past medical, surgical, family and social history reviewed and updated as indicated. Interim medical history since our last visit reviewed. Allergies and medications reviewed and updated. Outpatient Medications Prior to Visit  Medication Sig Dispense Refill   aspirin EC 81 MG tablet Take 81 mg by mouth daily. Swallow whole.     atorvastatin (LIPITOR) 20 MG tablet Take 1 tablet (20 mg total) by mouth daily. 90 tablet 2   Collagen Hydrolysate POWD 1 Scoop by Does not apply route daily.     losartan (COZAAR) 50 MG tablet TAKE 1 TABLET BY MOUTH EVERY DAY 90 tablet 2   metoprolol succinate (TOPROL-XL) 100 MG 24 hr tablet TAKE 1 TABLET BY MOUTH DAILY  WITH OR IMMEDIATELY FOLLOWING A  MEAL 90 tablet 3    Multiple Vitamin (MULTIVITAMIN WITH MINERALS) TABS tablet Take 2 tablets by mouth daily.     sodium chloride (OCEAN) 0.65 % SOLN nasal spray Place 1 spray into both nostrils every 4 (four) hours as needed for congestion.     tamoxifen (NOLVADEX) 20 MG tablet Take 1 tablet (20 mg total) by mouth daily. 90 tablet 3   triamterene-hydrochlorothiazide (MAXZIDE-25) 37.5-25 MG tablet TAKE 1 TABLET BY MOUTH DAILY 100 tablet 3   valACYclovir (VALTREX) 500 MG tablet Take 1 tablet (500 mg total) by mouth daily. Take daily for suppressive therapy 30 tablet 11   cefadroxil (DURICEF) 500 MG capsule Take 1 capsule (500 mg total) by mouth 2 (two) times daily. 10 capsule 0   No facility-administered medications prior to visit.     Per HPI unless specifically indicated in ROS section below Review of Systems  Constitutional:  Negative for fatigue and fever.  HENT:  Negative for congestion.   Eyes:  Negative for pain.  Respiratory:  Negative for cough and shortness of breath.   Cardiovascular:  Negative for chest pain, palpitations and leg swelling.  Gastrointestinal:  Negative for abdominal pain.  Genitourinary:  Negative for dysuria and vaginal  bleeding.  Musculoskeletal:  Negative for back pain.  Neurological:  Negative for syncope, light-headedness and headaches.  Psychiatric/Behavioral:  Negative for dysphoric mood.    Objective:  BP 120/60   Pulse 69   Temp 97.9 F (36.6 C) (Temporal)   Ht 5\' 2"  (1.575 m)   Wt 174 lb 8 oz (79.2 kg)   SpO2 97%   BMI 31.92 kg/m   Wt Readings from Last 3 Encounters:  02/02/23 174 lb 8 oz (79.2 kg)  12/13/22 176 lb 8 oz (80.1 kg)  12/06/22 175 lb 9.6 oz (79.7 kg)      Physical Exam Constitutional:      General: She is not in acute distress.    Appearance: Normal appearance. She is well-developed. She is not ill-appearing or toxic-appearing.  HENT:     Head: Normocephalic.     Right Ear: Hearing, tympanic membrane, ear canal and external ear normal.  Tympanic membrane is not erythematous, retracted or bulging.     Left Ear: Hearing, tympanic membrane, ear canal and external ear normal. Tympanic membrane is not erythematous, retracted or bulging.     Nose: No mucosal edema or rhinorrhea.     Right Turbinates: Swollen. Not pale.     Left Turbinates: Swollen. Not pale.     Right Sinus: Maxillary sinus tenderness and frontal sinus tenderness present.     Left Sinus: No maxillary sinus tenderness or frontal sinus tenderness.     Mouth/Throat:     Pharynx: Uvula midline.  Eyes:     General: Lids are normal. Lids are everted, no foreign bodies appreciated.     Conjunctiva/sclera: Conjunctivae normal.     Pupils: Pupils are equal, round, and reactive to light.  Neck:     Thyroid: No thyroid mass or thyromegaly.     Vascular: No carotid bruit.     Trachea: Trachea normal.  Cardiovascular:     Rate and Rhythm: Normal rate and regular rhythm.     Pulses: Normal pulses.     Heart sounds: Normal heart sounds, S1 normal and S2 normal. No murmur heard.    No friction rub. No gallop.  Pulmonary:     Effort: Pulmonary effort is normal. No tachypnea or respiratory distress.     Breath sounds: Normal breath sounds. No decreased breath sounds, wheezing, rhonchi or rales.  Abdominal:     General: Bowel sounds are normal.     Palpations: Abdomen is soft.     Tenderness: There is no abdominal tenderness.  Musculoskeletal:     Cervical back: Normal range of motion and neck supple.  Skin:    General: Skin is warm and dry.     Findings: No rash.  Neurological:     Mental Status: She is alert.  Psychiatric:        Mood and Affect: Mood is not anxious or depressed.        Speech: Speech normal.        Behavior: Behavior normal. Behavior is cooperative.        Thought Content: Thought content normal.        Judgment: Judgment normal.       Results for orders placed or performed in visit on 10/19/22  VITAMIN D 25 Hydroxy (Vit-D Deficiency,  Fractures)  Result Value Ref Range   VITD 41.20 30.00 - 100.00 ng/mL    Assessment and Plan  Chronic diarrhea Assessment & Plan: Chronic, no signs and symptoms of infection. Symptoms have been going on for  greater than a year and a half starting after her gallbladder surgery.  She states it is significantly worse with fatty diet but she has tried to avoid this and still has intermittent issues with things like chicken sausage.  I have provided a prescription for her to try of cholestyramine 2-3 times daily.   Acute non-recurrent maxillary sinusitis Assessment & Plan: Acute, likely viral or allergic sinusitis.  Recent COVID testing negative but done on day 3 of worsening symptoms.  She will retest this in 2 more days to assure true negative. Her symptoms have been going on for the past several months and so are more likely allergic in nature.  I have recommended for her to treat with Flonase 2 sprays per nostril daily as well as starting Xyzal or Zyrtec at bedtime. Considered treatment with antibiotic course, but she has recently completed several different antibiotic courses for breast infection including a cephalosporin which should have treated any bacterial sinus infection.  Return and ER precautions provided   Other orders -     Cholestyramine Light; Take 1 packet (4 g total) by mouth 2 (two) times daily.  Dispense: 60 each; Refill: 0    No follow-ups on file.   Eliezer Lofts, MD

## 2023-02-28 ENCOUNTER — Other Ambulatory Visit: Payer: Self-pay | Admitting: Family Medicine

## 2023-03-01 NOTE — Telephone Encounter (Signed)
Spoke to pt, scheduled cpe for 04/26/23

## 2023-03-01 NOTE — Telephone Encounter (Signed)
Please call and schedule CPE with fasting labs prior with Dr. Ermalene Searing after 04/12/2023.  Already has MWV scheduled.

## 2023-03-13 ENCOUNTER — Telehealth: Payer: Self-pay | Admitting: Family Medicine

## 2023-03-13 NOTE — Telephone Encounter (Signed)
Handicap Placard Application placed in Dr Bedsole's office in box to complete. 

## 2023-03-13 NOTE — Telephone Encounter (Signed)
Patient requested Handicap Placard form to be filled out by provider. Patient requested to send it via Call Patient to pick up within ASAP.Please advise at Mobile [phone] (mobile). Patient does not have form with them and has asked if Dr. Ermalene Searing would complete one in-office for her. Call patient to pick up when it is ready. Patient aware of from completion turnaround time.

## 2023-03-14 NOTE — Telephone Encounter (Signed)
Ms. Scharnhorst notified by telephone that handicap placard application is ready to be picked up.

## 2023-03-15 ENCOUNTER — Ambulatory Visit (INDEPENDENT_AMBULATORY_CARE_PROVIDER_SITE_OTHER): Payer: Medicare Other

## 2023-03-15 VITALS — Ht 62.0 in | Wt 172.0 lb

## 2023-03-15 DIAGNOSIS — Z1211 Encounter for screening for malignant neoplasm of colon: Secondary | ICD-10-CM

## 2023-03-15 DIAGNOSIS — Z0000 Encounter for general adult medical examination without abnormal findings: Secondary | ICD-10-CM

## 2023-03-15 NOTE — Patient Instructions (Signed)
Kristy [name] , Thank you for taking time to come for your Medicare Wellness Visit. I appreciate your ongoing commitment to your health goals. Please review the following plan we discussed and let me know if I can assist you in the future.   These are the goals we discussed:  Goals      Increase physical activity     Starting 11/05/2018, I will continue to walk at least 5000 steps daily.      Patient Stated     11/18/2019, I will try to lose about 10 lbs and get to 160 lbs.      Patient Stated     03/03/2021, I will continue to walk 5,000-6,000 steps daily.     Patient Stated     Walk 1 mile a day.        This is a list of the screening recommended for you and due dates:  Health Maintenance  Topic Date Due   DTaP/Tdap/Td vaccine (2 - Tdap) 04/20/2016   Colon Cancer Screening  05/14/2021   Flu Shot  06/21/2023   Mammogram  11/01/2023   Medicare Annual Wellness Visit  03/14/2024   Pneumonia Vaccine  Completed   DEXA scan (bone density measurement)  Completed   Hepatitis C Screening: USPSTF Recommendation to screen - Ages 11-79 yo.  Completed   Zoster (Shingles) Vaccine  Completed   HPV Vaccine  Aged Out   COVID-19 Vaccine  Discontinued    Advanced directives: Please bring a copy of your health care power of attorney and living will to the office to be added to your chart at your convenience.   Conditions/risks identified: .a  Next appointment: Follow up in one year for your annual wellness visit 03/18/24 @ 1pm televisit   Preventive Care 65 Years and Older, Female Preventive care refers to lifestyle choices and visits with your health care provider that can promote health and wellness. What does preventive care include? A yearly physical exam. This is also called an annual well check. Dental exams once or twice a year. Routine eye exams. Ask your health care provider how often you should have your eyes checked. Personal lifestyle choices, including: Daily care of your  teeth and gums. Regular physical activity. Eating a healthy diet. Avoiding tobacco and drug use. Limiting alcohol use. Practicing safe sex. Taking low-dose aspirin every day. Taking vitamin and mineral supplements as recommended by your health care provider. What happens during an annual well check? The services and screenings done by your health care provider during your annual well check will depend on your age, overall health, lifestyle risk factors, and family history of disease. Counseling  Your health care provider may ask you questions about your: Alcohol use. Tobacco use. Drug use. Emotional well-being. Home and relationship well-being. Sexual activity. Eating habits. History of falls. Memory and ability to understand (cognition). Work and work Astronomer. Reproductive health. Screening  You may have the following tests or measurements: Height, weight, and BMI. Blood pressure. Lipid and cholesterol levels. These may be checked every 5 years, or more frequently if you are over 63 years old. Skin check. Lung cancer screening. You may have this screening every year starting at age 61 if you have a 30-pack-year history of smoking and currently smoke or have quit within the past 15 years. Fecal occult blood test (FOBT) of the stool. You may have this test every year starting at age 86. Flexible sigmoidoscopy or colonoscopy. You may have a sigmoidoscopy every 5 years or  a colonoscopy every 10 years starting at age 17. Hepatitis C blood test. Hepatitis B blood test. Sexually transmitted disease (STD) testing. Diabetes screening. This is done by checking your blood sugar (glucose) after you have not eaten for a while (fasting). You may have this done every 1-3 years. Bone density scan. This is done to screen for osteoporosis. You may have this done starting at age 50. Mammogram. This may be done every 1-2 years. Talk to your health care provider about how often you should have  regular mammograms. Talk with your health care provider about your test results, treatment options, and if necessary, the need for more tests. Vaccines  Your health care provider may recommend certain vaccines, such as: Influenza vaccine. This is recommended every year. Tetanus, diphtheria, and acellular pertussis (Tdap, Td) vaccine. You may need a Td booster every 10 years. Zoster vaccine. You may need this after age 66. Pneumococcal 13-valent conjugate (PCV13) vaccine. One dose is recommended after age 49. Pneumococcal polysaccharide (PPSV23) vaccine. One dose is recommended after age 34. Talk to your health care provider about which screenings and vaccines you need and how often you need them. This information is not intended to replace advice given to you by your health care provider. Make sure you discuss any questions you have with your health care provider. Document Released: 12/03/2015 Document Revised: 07/26/2016 Document Reviewed: 09/07/2015 Elsevier Interactive Patient Education  2017 [employer].  Fall Prevention in the Home Falls can cause injuries. They can happen to people of all ages. There are many things you can do to make your home safe and to help prevent falls. What can I do on the outside of my home? Regularly fix the edges of walkways and driveways and fix any cracks. Remove anything that might make you trip as you walk through a door, such as a raised step or threshold. Trim any bushes or trees on the path to your home. Use bright outdoor lighting. Clear any walking paths of anything that might make someone trip, such as rocks or tools. Regularly check to see if handrails are loose or broken. Make sure that both sides of any steps have handrails. Any raised decks and porches should have guardrails on the edges. Have any leaves, snow, or ice cleared regularly. Use sand or salt on walking paths during winter. Clean up any spills in your garage right away. This  includes oil or grease spills. What can I do in the bathroom? Use night lights. Install grab bars by the toilet and in the tub and shower. Do not use towel bars as grab bars. Use non-skid mats or decals in the tub or shower. If you need to sit down in the shower, use a plastic, non-slip stool. Keep the floor dry. Clean up any water that spills on the floor as soon as it happens. Remove soap buildup in the tub or shower regularly. Attach bath mats securely with double-sided non-slip rug tape. Do not have throw rugs and other things on the floor that can make you trip. What can I do in the bedroom? Use night lights. Make sure that you have a light by your bed that is easy to reach. Do not use any sheets or blankets that are too big for your bed. They should not hang down onto the floor. Have a firm chair that has side arms. You can use this for support while you get dressed. Do not have throw rugs and other things on the floor that  can make you trip. What can I do in the kitchen? Clean up any spills right away. Avoid walking on wet floors. Keep items that you use a lot in easy-to-reach places. If you need to reach something above you, use a strong step stool that has a grab bar. Keep electrical cords out of the way. Do not use floor polish or wax that makes floors slippery. If you must use wax, use non-skid floor wax. Do not have throw rugs and other things on the floor that can make you trip. What can I do with my stairs? Do not leave any items on the stairs. Make sure that there are handrails on both sides of the stairs and use them. Fix handrails that are broken or loose. Make sure that handrails are as long as the stairways. Check any carpeting to make sure that it is firmly attached to the stairs. Fix any carpet that is loose or worn. Avoid having throw rugs at the top or bottom of the stairs. If you do have throw rugs, attach them to the floor with carpet tape. Make sure that you  have a light switch at the top of the stairs and the bottom of the stairs. If you do not have them, ask someone to add them for you. What else can I do to help prevent falls? Wear shoes that: Do not have high heels. Have rubber bottoms. Are comfortable and fit you well. Are closed at the toe. Do not wear sandals. If you use a stepladder: Make sure that it is fully opened. Do not climb a closed stepladder. Make sure that both sides of the stepladder are locked into place. Ask someone to hold it for you, if possible. Clearly mark and make sure that you can see: Any grab bars or handrails. First and last steps. Where the edge of each step is. Use tools that help you move around (mobility aids) if they are needed. These include: Canes. Walkers. Scooters. Crutches. Turn on the lights when you go into a dark area. Replace any light bulbs as soon as they burn out. Set up your furniture so you have a clear path. Avoid moving your furniture around. If any of your floors are uneven, fix them. If there are any pets around you, be aware of where they are. Review your medicines with your doctor. Some medicines can make you feel dizzy. This can increase your chance of falling. Ask your doctor what other things that you can do to help prevent falls. This information is not intended to replace advice given to you by your health care provider. Make sure you discuss any questions you have with your health care provider. Document Released: 09/02/2009 Document Revised: 04/13/2016 Document Reviewed: 12/11/2014 Elsevier Interactive Patient Education  2017 [employer].

## 2023-03-15 NOTE — Progress Notes (Signed)
I connected with  Kristy [name] on 03/15/23 by a audio enabled telemedicine application and verified that I am speaking with the correct person using two identifiers.  Patient Location: Home  Provider Location: Home Office  I discussed the limitations of evaluation and management by telemedicine. The patient expressed understanding and agreed to proceed.  Subjective:   Kristy [name] is a 82 y.o. female who presents for Medicare Annual (Subsequent) preventive examination.  Review of Systems      Cardiac Risk Factors include: advanced age (>39men, >84 women);sedentary lifestyle;hypertension     Objective:    Today's Vitals   03/15/23 0947  Weight: 172 lb (78 kg)  Height:  (1.575 m)  PainSc: 4    Body mass index is 31.46 kg/m.     03/15/2023   10:03 AM 08/21/2022    1:05 PM 03/13/2022   12:10 PM 09/15/2021    1:14 PM 03/03/2021   10:35 AM 02/08/2021    9:11 AM 01/11/2021   11:17 AM  Advanced Directives  Does Patient Have a Medical Advance Directive? Yes No No No No No No  Type of Estate agent of [place];Living will        Copy of Healthcare Power of Attorney in Chart? No - copy requested        Would patient like information on creating a medical advance directive?   No - Patient declined Yes (Inpatient - patient defers creating a medical advance directive at this time - Information given) No - Patient declined No - Patient declined No - Patient declined    Current Medications (verified) Outpatient Encounter Medications as of 03/15/2023  Medication Sig   aspirin EC 81 MG tablet Take 81 mg by mouth daily. Swallow whole.   atorvastatin (LIPITOR) 20 MG tablet Take 1 tablet (20 mg total) by mouth daily.   Collagen Hydrolysate POWD 1 Scoop by Does not apply route daily.   losartan (COZAAR) 50 MG tablet TAKE 1 TABLET BY MOUTH EVERY DAY   metoprolol succinate (TOPROL-XL) 100 MG 24 hr tablet TAKE 1 TABLET BY MOUTH DAILY  WITH OR IMMEDIATELY  FOLLOWING A  MEAL   Multiple Vitamin (MULTIVITAMIN WITH MINERALS) TABS tablet Take 2 tablets by mouth daily.   sodium chloride (OCEAN) 0.65 % SOLN nasal spray Place 1 spray into both nostrils every 4 (four) hours as needed for congestion.   tamoxifen (NOLVADEX) 20 MG tablet Take 1 tablet (20 mg total) by mouth daily.   triamterene-hydrochlorothiazide (MAXZIDE-25) 37.5-25 MG tablet TAKE 1 TABLET BY MOUTH DAILY   valACYclovir (VALTREX) 500 MG tablet Take 1 tablet (500 mg total) by mouth daily. Take daily for suppressive therapy   cholestyramine light (PREVALITE) 4 g packet Take 1 packet (4 g total) by mouth 2 (two) times daily. (Patient not taking: Reported on 03/15/2023)   No facility-administered encounter medications on file as of 03/15/2023.    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   Constipation    uses stool softener PRN- uses once a week to  once every 2 weeks    Fibromuscular dysplasia    Gallbladder disease    Hyperlipidemia    Hypertension    For 16 years; urinary catecholamines within normal limits 12/09; renal Doppler ultrasound showed no evidence for renal artery stenosis   Normal echocardiogram 02/2009   LVEF 65%; no regional wall motion abnormalities; normal RV size and function; pulmonic valve had increased gradient across w/ peak gradient of about 36 mmHg (range of moderate pulmonic stenosis) followup showed normal valve   Papilloma of breast    right   Personal history of radiation therapy    Tinnitus of left ear    Tubular adenoma of colon 10/2013   Past Surgical  History:  Procedure Laterality Date   ANGIOPLASTY     2012   BREAST BIOPSY Right 2006   benign   BREAST BIOPSY Right 11/09/2020   BREAST BIOPSY Right 11/23/2020   BREAST BIOPSY Right 11/29/2020   x2   BREAST EXCISIONAL BIOPSY Right 07/2017   benign   BREAST LUMPECTOMY Right 12/16/2020   BREAST LUMPECTOMY WITH RADIOACTIVE SEED LOCALIZATION Right 08/14/2017   Procedure: RIGHT BREAST LUMPECTOMY WITH RADIOACTIVE SEED LOCALIZATION;  Surgeon: Harriette Bouillon, MD;  Location: [facility];  Service: General;  Laterality: Right;   BREAST LUMPECTOMY WITH RADIOACTIVE SEED LOCALIZATION Right 12/16/2020   Procedure: RADIOACTIVE SEED GUIDED TIMES 3 RIGHT BREAST LUMPECTOMY;  Surgeon: Harriette Bouillon, MD;  Location: [facility];  Service: General;  Laterality: Right;   Cardiolyte  09/2006   Neg   CHOLECYSTECTOMY N/A 09/22/2021   Procedure: LAPAROSCOPIC CHOLECYSTECTOMY WITH  INTRAOPERATIVE CHOLANGIOGRAM;  Surgeon: Harriette Bouillon, MD;  Location: [facility];  Service: General;  Laterality: N/A;   COLONOSCOPY     KNEE CARTILAGE SURGERY     right knee   POLYPECTOMY     RE-EXCISION OF BREAST LUMPECTOMY Right 01/11/2021   Procedure: RE-EXCISION OF RIGHT BREAST LUMPECTOMY;  Surgeon: Harriette Bouillon, MD;  Location: [facility];  Service: General;  Laterality: Right;   TOTAL ABDOMINAL HYSTERECTOMY     no cervix   UPPER GASTROINTESTINAL ENDOSCOPY     Family History  Problem Relation Age of Onset   Prostate cancer Father    Lung cancer Father    Colon cancer Maternal Uncle    Colon cancer Paternal Uncle    Colon polyps Sister    Coronary artery disease Paternal Grandmother    Goiter Other        ?   Esophageal cancer Neg Hx    Stomach cancer Neg Hx    Rectal cancer Neg Hx    Bladder Cancer Neg Hx    Kidney cancer Neg Hx    Social History   Socioeconomic History   Marital status: Widowed    Spouse name: Not on file   Number of children: 0   Years of  education: Not on file   Highest education level: Not on file  Occupational History   Occupation: Educational psychologist: RETIRED    Comment: Retired  Tobacco Use   Smoking status: Former    Packs/day: 1.00    Years: 15.00    Additional pack years: 0.00    Total pack years: 15.00    Types: Cigarettes    Quit date: 11/20/1982    Years since quitting: 40.3   Smokeless tobacco: Never   Tobacco comments:    15 pack year history  Vaping Use   Vaping Use: Never used  Substance  and Sexual Activity   Alcohol use: No   Drug use: No   Sexual activity: Not on file  Other Topics Concern   Not on file  Social History Narrative   Widowed in 2007-spouse died from colon cancer.   Regular exercise at [facility].   Diet consists of fruits and veggies, snacks a lot.       Social Determinants of Health   Financial Resource Strain: Low Risk  (03/15/2023)   Overall Financial Resource Strain (CARDIA)    Difficulty of Paying Living Expenses: Not hard at all  Food Insecurity: No Food Insecurity (03/15/2023)   Hunger Vital Sign    Worried About Running Out of Food in the Last Year: Never true    Ran Out of Food in the Last Year: Never true  Transportation Needs: No Transportation Needs (03/15/2023)   PRAPARE - Administrator, Civil Service (Medical): No    Lack of Transportation (Non-Medical): No  Physical Activity: Inactive (03/15/2023)   Exercise Vital Sign    Days of Exercise per Week: 0 days    Minutes of Exercise per Session: 0 min  Stress: No Stress Concern Present (03/15/2023)   [employer] of Occupational Health - Occupational Stress Questionnaire    Feeling of Stress : Only a little  Social Connections: Moderately Isolated (03/15/2023)   Social Connection and Isolation Panel [NHANES]    Frequency of Communication with Friends and Family: More than three times a week    Frequency of Social Gatherings with Friends and Family: More than three times a week    Attends  Religious Services: Never    Database administrator or Organizations: No    Attends Engineer, structural: Never    Marital Status: Married    Tobacco Counseling Counseling given: Not Answered Tobacco comments: 15 pack year history   Clinical Intake:  Pre-visit preparation completed: Yes  Pain : 0-10 Pain Score: 4  Pain Location: Knee Pain Descriptors / Indicators: Aching Pain Frequency: Intermittent     Nutritional Risks: Nausea/ vomitting/ diarrhea (occasionall loose stools) Diabetes: No  How often do you need to have someone help you when you read instructions, pamphlets, or other written materials from your doctor or pharmacy?: 1 - Never  Diabetic? no  Interpreter Needed?: No  Information entered by :: C.Talissa Apple LPN   Activities of Daily Living    03/15/2023   10:04 AM 08/21/2022    1:06 PM  In your present state of health, do you have any difficulty performing the following activities:  Hearing? 0   Vision? 0   Difficulty concentrating or making decisions? 0   Walking or climbing stairs? 1   Comment Knee pain   Dressing or bathing? 0   Doing errands, shopping? 0 0  Preparing Food and eating ? N   Using the Toilet? N   In the past six months, have you accidently leaked urine? N   Do you have problems with loss of bowel control? N   Managing your Medications? N   Managing your Finances? N   Housekeeping or managing your Housekeeping? N     Patient Care Team: Excell Seltzer, MD as PCP - General Harriette Bouillon, MD as Consulting Physician (General Surgery) Dorothy Puffer, MD as Consulting Physician (Radiation Oncology) Meryl Dare, MD as Consulting Physician (Gastroenterology) Pershing Proud, RN as Oncology Nurse Navigator Donnelly Angelica, RN as Oncology Nurse Navigator [place], Garry Heater, [facility] as Pharmacist (Pharmacist) Rachel Moulds,  MD as Consulting Physician (Hematology and Oncology)  Indicate any recent Medical Services you may  have received from other than [facility] providers in the past year (date may be approximate).     Assessment:   This is a routine wellness examination for [place].  Hearing/Vision screen Hearing Screening - Comments:: No aids Vision Screening - Comments:: Glasses - [facility]  Dietary issues and exercise activities discussed: Current Exercise Habits: The patient does not participate in regular exercise at present, Exercise limited by: orthopedic condition(s) (knee pain)   Goals Addressed             This Visit's Progress    Patient Stated       Walk 1 mile a day.       Depression Screen    03/15/2023    9:59 AM 03/13/2022   12:08 PM 03/03/2021   10:37 AM 11/18/2019    2:52 PM 11/05/2018   11:11 AM 10/05/2017   10:42 AM 09/04/2017   11:02 AM  PHQ 2/9 Scores  PHQ - 2 Score 1 0 4 0 0 0 0  PHQ- 9 Score   4 0 0  2    Fall Risk    03/15/2023   10:03 AM 02/02/2023    3:01 PM 03/13/2022   12:11 PM 03/03/2021   10:36 AM 11/18/2019    2:51 PM  Fall Risk   Falls in the past year? 0 0 0 0 0  Number falls in past yr: 0 0 0 0 0  Injury with Fall? 0 0 0 0 0  Risk for fall due to : No Fall Risks No Fall Risks No Fall Risks Medication side effect Medication side effect  Follow up Falls prevention discussed;Falls evaluation completed Falls evaluation completed Falls prevention discussed Falls evaluation completed;Falls prevention discussed Falls evaluation completed;Falls prevention discussed    FALL RISK PREVENTION PERTAINING TO THE HOME:  Any stairs in or around the home? No  If so, are there any without handrails?  N/A Home free of loose throw rugs in walkways, pet beds, electrical cords, etc? Yes  Adequate lighting in your home to reduce risk of falls? Yes   ASSISTIVE DEVICES UTILIZED TO PREVENT FALLS:  Life alert? No  Use of a cane, walker or w/c? Yes  Grab bars in the bathroom? No  Shower chair or bench in shower? No  Elevated toilet seat or a handicapped toilet? No     Cognitive Function:    03/03/2021   10:41 AM 11/18/2019    2:54 PM 11/05/2018   11:12 AM 09/04/2017   11:00 AM  MMSE - Mini Mental State Exam  Orientation to time 5 5 5 5   Orientation to Place 5 5 5 5   Registration 3 3 3 3   Attention/ Calculation 5 5 0 0  Recall 3 3 3 3   Language- name 2 objects   0 0  Language- repeat 1 1 1 1   Language- follow 3 step command   3 3  Language- read & follow direction   0 0  Write a sentence   0 0  Copy design   0 0  Total score   20 20        03/15/2023   10:04 AM 03/13/2022   12:13 PM  6CIT Screen  What Year? 0 points 0 points  What month? 0 points 0 points  What time? 0 points 0 points  Count back from 20 0 points 0 points  Months in reverse 0  points 2 points  Repeat phrase 0 points 0 points  Total Score 0 points 2 points    Immunizations Immunization History  Administered Date(s) Administered   COVID-19, mRNA, vaccine(Comirnaty)12 years and older 10/18/2022   Fluad Quad(high Dose 65+) 07/24/2019, 09/11/2020, 09/08/2022   Influenza Split 10/03/2011, 08/30/2012   Influenza Whole 08/28/2007, 08/19/2008, 08/12/2009, 09/14/2010   Influenza,inj,Quad PF,6+ Mos 08/13/2013, 08/18/2014, 09/03/2015, 08/03/2016, 09/04/2017, 09/05/2018   PFIZER(Purple Top)SARS-COV-2 Vaccination 12/24/2019, 01/14/2020, 08/22/2020   Pneumococcal Conjugate-13 01/09/2014   Pneumococcal Polysaccharide-23 04/20/2006, 09/04/2017   Respiratory Syncytial Virus Vaccine,Recomb Aduvanted(Arexvy) 11/29/2022   Td 04/20/2006   Zoster Recombinat (Shingrix) 08/02/2021, 10/24/2021    TDAP status: Due, Education has been provided regarding the importance of this vaccine. Advised may receive this vaccine at local pharmacy or Health Dept. Aware to provide a copy of the vaccination record if obtained from local pharmacy or Health Dept. Verbalized acceptance and understanding.  Flu Vaccine status: Up to date  Pneumococcal vaccine status: Up to date  Covid-19 vaccine  status: Information provided on how to obtain vaccines.   Qualifies for Shingles Vaccine? Yes   Zostavax completed No   Shingrix Completed?: Yes  Screening Tests Health Maintenance  Topic Date Due   DTaP/Tdap/Td (2 - Tdap) 04/20/2016   COLONOSCOPY (Pts 45-27yrs Insurance coverage will need to be confirmed)  05/14/2021   INFLUENZA VACCINE  06/21/2023   MAMMOGRAM  11/01/2023   Medicare Annual Wellness (AWV)  03/14/2024   Pneumonia Vaccine 19+ Years old  Completed   DEXA SCAN  Completed   Hepatitis C Screening  Completed   Zoster Vaccines- Shingrix  Completed   HPV VACCINES  Aged Out   COVID-19 Vaccine  Discontinued    Health Maintenance  Health Maintenance Due  Topic Date Due   DTaP/Tdap/Td (2 - Tdap) 04/20/2016   COLONOSCOPY (Pts 45-36yrs Insurance coverage will need to be confirmed)  05/14/2021    Colorectal cancer screening: Type of screening: Colonoscopy. Completed 05/15/2019. Repeat every 2 years  Mammogram status: Completed 10/31/2022. Repeat every year  Bone Density status: Completed 10/16/2022. Results reflect: Bone density results: OSTEOPOROSIS. Repeat every 2 years.  Lung Cancer Screening: (Low Dose CT Chest recommended if Age 33-80 years, 30 pack-year currently smoking OR have quit w/in 15years.) does not qualify.   Lung Cancer Screening Referral: no  Additional Screening:  Hepatitis C Screening: does qualify; Completed 08/24/16  Vision Screening: Recommended annual ophthalmology exams for early detection of glaucoma and other disorders of the eye. Is the patient up to date with their annual eye exam?  Yes  Who is the provider or what is the name of the office in which the patient attends annual eye exams? [facility] If pt is not established with a provider, would they like to be referred to a provider to establish care? No .   Dental Screening: Recommended annual dental exams for proper oral hygiene  Community Resource Referral / Chronic Care  Management: CRR required this visit?  No   CCM required this visit?  No      Plan:     I have personally reviewed and noted the following in the patient's chart:   Medical and social history Use of alcohol, tobacco or illicit drugs  Current medications and supplements including opioid prescriptions. Patient is not currently taking opioid prescriptions. Functional ability and status Nutritional status Physical activity Advanced directives List of other physicians Hospitalizations, surgeries, and ER visits in previous 12 months Vitals Screenings to include cognitive, depression, and falls Referrals  and appointments  In addition, I have reviewed and discussed with patient certain preventive protocols, quality metrics, and best practice recommendations. A written personalized care plan for preventive services as well as general preventive health recommendations were provided to patient.     Maryan Puls, LPN   [id number]   Nurse Notes: none

## 2023-04-05 ENCOUNTER — Telehealth: Payer: Self-pay | Admitting: *Deleted

## 2023-04-05 DIAGNOSIS — R7303 Prediabetes: Secondary | ICD-10-CM

## 2023-04-05 DIAGNOSIS — E782 Mixed hyperlipidemia: Secondary | ICD-10-CM

## 2023-04-05 NOTE — Telephone Encounter (Signed)
-----   Message from Alvina Chou sent at 04/05/2023  3:57 PM EDT ----- Regarding: Lab orders for Thursday, 5.30.24 Patient is scheduled for CPX labs, please order future labs, Thanks , Camelia Eng

## 2023-04-19 ENCOUNTER — Other Ambulatory Visit (INDEPENDENT_AMBULATORY_CARE_PROVIDER_SITE_OTHER): Payer: Medicare Other

## 2023-04-19 DIAGNOSIS — E782 Mixed hyperlipidemia: Secondary | ICD-10-CM | POA: Diagnosis not present

## 2023-04-19 DIAGNOSIS — R7303 Prediabetes: Secondary | ICD-10-CM

## 2023-04-19 LAB — COMPREHENSIVE METABOLIC PANEL
ALT: 14 U/L (ref 0–35)
AST: 19 U/L (ref 0–37)
Albumin: 4 g/dL (ref 3.5–5.2)
Alkaline Phosphatase: 37 U/L — ABNORMAL LOW (ref 39–117)
BUN: 14 mg/dL (ref 6–23)
CO2: 26 mEq/L (ref 19–32)
Calcium: 9.5 mg/dL (ref 8.4–10.5)
Chloride: 105 mEq/L (ref 96–112)
Creatinine, Ser: 0.83 mg/dL (ref 0.40–1.20)
GFR: 66.83 mL/min (ref >60.00)
Glucose, Bld: 112 mg/dL — ABNORMAL HIGH (ref 70–99)
Potassium: 4 mEq/L (ref 3.5–5.1)
Sodium: 140 mEq/L (ref 135–145)
Total Bilirubin: 0.4 mg/dL (ref 0.2–1.2)
Total Protein: 6.9 g/dL (ref 6.0–8.3)

## 2023-04-19 LAB — LIPID PANEL
Cholesterol: 124 mg/dL (ref 0–200)
HDL: 49.4 mg/dL (ref >39.00)
LDL Cholesterol: 47 mg/dL (ref 0–99)
NonHDL: 74.21
Total CHOL/HDL Ratio: 3
Triglycerides: 136 mg/dL (ref 0.0–149.0)
VLDL: 27.2 mg/dL (ref 0.0–40.0)

## 2023-04-19 LAB — HEMOGLOBIN A1C: Hgb A1c MFr Bld: 6.2 % (ref 4.6–6.5)

## 2023-04-20 NOTE — Progress Notes (Signed)
No critical labs need to be addressed urgently. We will discuss labs in detail at upcoming office visit.   

## 2023-04-26 ENCOUNTER — Ambulatory Visit (INDEPENDENT_AMBULATORY_CARE_PROVIDER_SITE_OTHER): Payer: Medicare Other | Admitting: Family Medicine

## 2023-04-26 ENCOUNTER — Encounter: Payer: Self-pay | Admitting: Family Medicine

## 2023-04-26 VITALS — BP 134/76 | HR 75 | Temp 97.5°F | Resp 16 | Ht 62.0 in | Wt 171.5 lb

## 2023-04-26 DIAGNOSIS — I10 Essential (primary) hypertension: Secondary | ICD-10-CM | POA: Diagnosis not present

## 2023-04-26 DIAGNOSIS — M818 Other osteoporosis without current pathological fracture: Secondary | ICD-10-CM

## 2023-04-26 DIAGNOSIS — D0511 Intraductal carcinoma in situ of right breast: Secondary | ICD-10-CM

## 2023-04-26 DIAGNOSIS — E782 Mixed hyperlipidemia: Secondary | ICD-10-CM

## 2023-04-26 DIAGNOSIS — I701 Atherosclerosis of renal artery: Secondary | ICD-10-CM | POA: Diagnosis not present

## 2023-04-26 DIAGNOSIS — I6523 Occlusion and stenosis of bilateral carotid arteries: Secondary | ICD-10-CM | POA: Diagnosis not present

## 2023-04-26 DIAGNOSIS — I773 Arterial fibromuscular dysplasia: Secondary | ICD-10-CM

## 2023-04-26 DIAGNOSIS — Z0000 Encounter for general adult medical examination without abnormal findings: Secondary | ICD-10-CM | POA: Diagnosis not present

## 2023-04-26 DIAGNOSIS — R7303 Prediabetes: Secondary | ICD-10-CM

## 2023-04-26 NOTE — Assessment & Plan Note (Signed)
active treatment on tamoxifem, followed by oncology Dr. Magrinot 

## 2023-04-26 NOTE — Assessment & Plan Note (Signed)
Followed by vascular yearly. 

## 2023-04-26 NOTE — Assessment & Plan Note (Signed)
Chronic, improving with diet changes. 

## 2023-04-26 NOTE — Assessment & Plan Note (Signed)
On  tamoxifen as well as post menopausal state.  Vit D nml. Recommend weight bearing exercise, calcium in diet and vit D supplement 400 IU 1-2 times daily.  Discussed fosamax initiation..  will check on onc recs given on tampoxifem but plan on fosamax vs. Prolia.

## 2023-04-26 NOTE — Assessment & Plan Note (Signed)
BP controlled  Followed by vascular/cardiology   ON baby ASA  She is on ARB, BP controlled.

## 2023-04-26 NOTE — Assessment & Plan Note (Signed)
Hx of FMD/renal artery stenosis  Stable, chronic.  Continue current medication.  Losartan 50 mg daily  Maxide 37.5/25 mg  po daily  Metoprolol XL  100 mg daily

## 2023-04-26 NOTE — Assessment & Plan Note (Signed)
Stable, chronic.  Continue current medication. ? ? ?Atorvastatin 20 mg daily ?

## 2023-04-26 NOTE — Progress Notes (Signed)
Patient ID: Kristy [name], female    DOB: 1941-11-10, 82 y.o.   MRN: [id number]  This visit was conducted in person.  BP 134/76   Pulse 75   Temp (!) 97.5 F (36.4 C)   Resp 16   Ht 5\' 2"  (1.575 m)   Wt 171 lb 8 oz (77.8 kg)   SpO2 97%   BMI 31.37 kg/m    CC:  Chief Complaint  Patient presents with   Annual Exam   Hip Problem    Can hear popping when she walks    Subjective:   HPI: Kristy [name] is a 82 y.o. female presenting on 04/26/2023 for Annual Exam and Hip Problem (Can hear popping when she walks)  The patient presents for  complete physical and review of chronic health problems. He/She also has the following acute concerns today:   The patient saw a LPN or RN for medicare wellness visit. 03/15/23  Prevention and wellness was reviewed in detail. Note reviewed and important notes copied below.  Wt Readings from Last 3 Encounters:  04/26/23 171 lb 8 oz (77.8 kg)  03/15/23 172 lb (78 kg)  02/02/23 174 lb 8 oz (79.2 kg)    Prediabetes  stable control. Lab Results  Component Value Date   HGBA1C 6.2 04/19/2023    Hypertension:   Well controlled on metoprolol, losartan 50 mg daily and maxide  BP Readings from Last 3 Encounters:  04/26/23 134/76  02/02/23 120/60  12/13/22 (!) 143/71  Using medication without problems or lightheadedness:  none Chest pain with exertion: none Edema: none Short of breath: none Average home BPs: Other issues:   Elevated Cholesterol:  LDL at goal < 70 on  atorvastatin 20 mg daily Lab Results  Component Value Date   CHOL 124 04/19/2023   HDL 49.40 04/19/2023   LDLCALC 47 04/19/2023   LDLDIRECT 140.3 03/26/2013   TRIG 136.0 04/19/2023   CHOLHDL 3 04/19/2023  Using medications without problems: none Muscle aches:  none Diet compliance: good Exercise: walking. Other complaints:   Fibromuscular dysplasia/Renal artery stenosis/carotid artery stenosis followed by vascular.  Hx of breast cancer, active treatment  on tamoxifem, followed by oncology Dr. Arlice Colt     Relevant past medical, surgical, family and social history reviewed and updated as indicated. Interim medical history since our last visit reviewed. Allergies and medications reviewed and updated. Outpatient Medications Prior to Visit  Medication Sig Dispense Refill   aspirin EC 81 MG tablet Take 81 mg by mouth daily. Swallow whole.     atorvastatin (LIPITOR) 20 MG tablet Take 1 tablet (20 mg total) by mouth daily. 90 tablet 2   Collagen Hydrolysate POWD 1 Scoop by Does not apply route daily.     losartan (COZAAR) 50 MG tablet TAKE 1 TABLET BY MOUTH EVERY DAY 90 tablet 2   metoprolol succinate (TOPROL-XL) 100 MG 24 hr tablet TAKE 1 TABLET BY MOUTH DAILY  WITH OR IMMEDIATELY FOLLOWING A  MEAL 100 tablet 0   Multiple Vitamin (MULTIVITAMIN WITH MINERALS) TABS tablet Take 2 tablets by mouth daily.     sodium chloride (OCEAN) 0.65 % SOLN nasal spray Place 1 spray into both nostrils every 4 (four) hours as needed for congestion.     tamoxifen (NOLVADEX) 20 MG tablet Take 1 tablet (20 mg total) by mouth daily. 90 tablet 3   triamterene-hydrochlorothiazide (MAXZIDE-25) 37.5-25 MG tablet TAKE 1 TABLET BY MOUTH DAILY 100 tablet 3   valACYclovir (VALTREX) 500 MG tablet  Take 1 tablet (500 mg total) by mouth daily. Take daily for suppressive therapy 30 tablet 11   cholestyramine light (PREVALITE) 4 g packet Take 1 packet (4 g total) by mouth 2 (two) times daily. (Patient not taking: Reported on 03/15/2023) 60 each 0   No facility-administered medications prior to visit.     Per HPI unless specifically indicated in ROS section below Review of Systems  Constitutional:  Negative for fatigue and fever.  HENT:  Negative for congestion.   Eyes:  Negative for pain.  Respiratory:  Negative for cough and shortness of breath.   Cardiovascular:  Negative for chest pain, palpitations and leg swelling.  Gastrointestinal:  Negative for abdominal pain.   Genitourinary:  Negative for dysuria and vaginal bleeding.  Musculoskeletal:  Negative for back pain.  Neurological:  Negative for syncope, light-headedness and headaches.  Psychiatric/Behavioral:  Negative for dysphoric mood.    Objective:  BP 134/76   Pulse 75   Temp (!) 97.5 F (36.4 C)   Resp 16   Ht 5\' 2"  (1.575 m)   Wt 171 lb 8 oz (77.8 kg)   SpO2 97%   BMI 31.37 kg/m   Wt Readings from Last 3 Encounters:  04/26/23 171 lb 8 oz (77.8 kg)  03/15/23 172 lb (78 kg)  02/02/23 174 lb 8 oz (79.2 kg)      Physical Exam Vitals and nursing note reviewed.  Constitutional:      General: She is not in acute distress.    Appearance: Normal appearance. She is well-developed. She is not ill-appearing or toxic-appearing.  HENT:     Head: Normocephalic.     Right Ear: Hearing, tympanic membrane, ear canal and external ear normal.     Left Ear: Hearing, tympanic membrane, ear canal and external ear normal.     Nose: Nose normal.  Eyes:     General: Lids are normal. Lids are everted, no foreign bodies appreciated.     Conjunctiva/sclera: Conjunctivae normal.     Pupils: Pupils are equal, round, and reactive to light.  Neck:     Thyroid: No thyroid mass or thyromegaly.     Vascular: No carotid bruit.     Trachea: Trachea normal.  Cardiovascular:     Rate and Rhythm: Normal rate and regular rhythm.     Heart sounds: Normal heart sounds, S1 normal and S2 normal. No murmur heard.    No gallop.  Pulmonary:     Effort: Pulmonary effort is normal. No respiratory distress.     Breath sounds: Normal breath sounds. No wheezing, rhonchi or rales.  Abdominal:     General: Bowel sounds are normal. There is no distension or abdominal bruit.     Palpations: Abdomen is soft. There is no fluid wave or mass.     Tenderness: There is no abdominal tenderness. There is no guarding or rebound.     Hernia: No hernia is present.  Musculoskeletal:     Cervical back: Normal range of motion and neck  supple.  Lymphadenopathy:     Cervical: No cervical adenopathy.  Skin:    General: Skin is warm and dry.     Findings: No rash.  Neurological:     Mental Status: She is alert.     Cranial Nerves: No cranial nerve deficit.     Sensory: No sensory deficit.  Psychiatric:        Mood and Affect: Mood is not anxious or depressed.  Speech: Speech normal.        Behavior: Behavior normal. Behavior is cooperative.        Judgment: Judgment normal.       Results for orders placed or performed in visit on 04/19/23  Lipid panel  Result Value Ref Range   Cholesterol 124 0 - 200 mg/dL   Triglycerides 161.0 0.0 - 149.0 mg/dL   HDL 96.04 >54.09 mg/dL   VLDL 81.1 0.0 - 91.4 mg/dL   LDL Cholesterol 47 0 - 99 mg/dL   Total CHOL/HDL Ratio 3    NonHDL 74.21   Hemoglobin A1c  Result Value Ref Range   Hgb A1c MFr Bld 6.2 4.6 - 6.5 %  Comprehensive metabolic panel  Result Value Ref Range   Sodium 140 135 - 145 mEq/L   Potassium 4.0 3.5 - 5.1 mEq/L   Chloride 105 96 - 112 mEq/L   CO2 26 19 - 32 mEq/L   Glucose, Bld 112 (H) 70 - 99 mg/dL   BUN 14 6 - 23 mg/dL   Creatinine, Ser 7.82 0.40 - 1.20 mg/dL   Total Bilirubin 0.4 0.2 - 1.2 mg/dL   Alkaline Phosphatase 37 (L) 39 - 117 U/L   AST 19 0 - 37 U/L   ALT 14 0 - 35 U/L   Total Protein 6.9 6.0 - 8.3 g/dL   Albumin 4.0 3.5 - 5.2 g/dL   GFR 95.62 >13.08 mL/min   Calcium 9.5 8.4 - 10.5 mg/dL     COVID 19 screen:  No recent travel or known exposure to COVID19 The patient denies respiratory symptoms of COVID 19 at this time. The importance of social distancing was discussed today.   Assessment and Plan   The patient's preventative maintenance and recommended screening tests for an annual wellness exam were reviewed in full today. Brought up to date unless services declined.  Counselled on the importance of diet, exercise, and its role in overall health and mortality. The patient's FH and SH was reviewed, including their home life,  tobacco status, and drug and alcohol status.   Vaccines: up to date, COVID x 3, refused td and  uptodate with shingrix  Colon:04/2019, Dr. Russella Dar,  Repeat in 2 years given precancer polyps..  scheduled 05/2023 DVE/pap: total hysterectomy   Mammo:  followed by oncology for breast cancer  10/2022 DEXA: 09/2022 osteoporosis on tamoxifen   Problem List Items Addressed This Visit     Carotid artery stenosis     Followed by vascular yearly.      Ductal carcinoma in situ (DCIS) of right breast    active treatment on tamoxifem, followed by oncology Dr. Arlice Colt      Fibromuscular dysplasia (HCC)    BP controlled  Followed by vascular/cardiology   ON baby ASA  She is on ARB, BP controlled.       Hyperlipidemia    Stable, chronic.  Continue current medication.   Atorvastatin 20 mg daily      HYPERTENSION, BENIGN ESSENTIAL, LABILE    Hx of FMD/renal artery stenosis  Stable, chronic.  Continue current medication.  Losartan 50 mg daily  Maxide 37.5/25 mg  po daily  Metoprolol XL  100 mg daily      Osteoporosis    On  tamoxifen as well as post menopausal state.  Vit D nml. Recommend weight bearing exercise, calcium in diet and vit D supplement 400 IU 1-2 times daily.  Discussed fosamax initiation..  will check on onc recs given on tampoxifem  but plan on fosamax vs. Prolia.      Prediabetes    Chronic, improving with diet changes.      Renal artery stenosis (HCC)     Followed by vascular yearly.      Other Visit Diagnoses     Routine general medical examination at a health care facility    -  Primary         Kerby Nora, MD

## 2023-05-02 ENCOUNTER — Telehealth: Payer: Self-pay | Admitting: *Deleted

## 2023-05-02 ENCOUNTER — Other Ambulatory Visit: Payer: Self-pay | Admitting: Family Medicine

## 2023-05-02 MED ORDER — ALENDRONATE SODIUM 70 MG PO TABS: 70.0000 mg | ORAL_TABLET | ORAL | 11 refills | Status: DC

## 2023-05-02 NOTE — Telephone Encounter (Signed)
-----   Message from Excell Seltzer, MD sent at 05/02/2023  8:39 AM EDT -----  Please call pt... let her know Dr. Al Pimple is okay with fosamax for osteoporosis.Marland Kitchen is she interested in starting?  Kerby Nora, MD [facility]  ----- Message ----- From: Rachel Moulds, MD Sent: 04/28/2023   8:06 AM EDT To: Excell Seltzer, MD  Any of them are fine with [place] Dr Ermalene Searing. ----- Message ----- From: Excell Seltzer, MD Sent: 04/26/2023   3:18 PM EDT To: Rachel Moulds, MD  No!  A very pleasant shared patient on tamoxifen for past breast cancer.  She has a new diagnosis of osteoporosis.  I planned on starting her on Fosamax but I did not know if he felt there was a better alternative such as Prolia in the setting of a patient on tamoxifen.  Please let me know your thoughts. Best, Amy Ermalene Searing

## 2023-05-02 NOTE — Telephone Encounter (Signed)
Kristy [name] notified as instructed by telephone.  She is agreeable to starting the Fosamax.  [facility] [address]

## 2023-05-02 NOTE — Telephone Encounter (Signed)
Prescription sent to pharmacy.

## 2023-05-09 ENCOUNTER — Other Ambulatory Visit: Payer: Self-pay | Admitting: Family Medicine

## 2023-06-01 ENCOUNTER — Ambulatory Visit: Payer: Medicare Other | Admitting: Physician Assistant

## 2023-06-08 ENCOUNTER — Other Ambulatory Visit: Payer: Self-pay | Admitting: Family Medicine

## 2023-06-11 ENCOUNTER — Encounter (INDEPENDENT_AMBULATORY_CARE_PROVIDER_SITE_OTHER): Payer: Medicare Other

## 2023-06-11 ENCOUNTER — Ambulatory Visit (INDEPENDENT_AMBULATORY_CARE_PROVIDER_SITE_OTHER): Payer: Medicare Other | Admitting: Vascular Surgery

## 2023-06-15 ENCOUNTER — Other Ambulatory Visit: Payer: Self-pay | Admitting: Family Medicine

## 2023-06-19 DIAGNOSIS — L811 Chloasma: Secondary | ICD-10-CM | POA: Diagnosis not present

## 2023-06-20 ENCOUNTER — Telehealth: Payer: Self-pay | Admitting: Family Medicine

## 2023-06-20 NOTE — Telephone Encounter (Signed)
Patient contacted the office regarding some concerns, asked to leave a message. Patient says she got a message from [employer] that there was a listeria outbreak with some cold cut meats at their store. Patient says she purchased and ate those items from around the time the outbreak was. Patient wanted to ask Dr. Ermalene Searing if this something she could be tested for through lab draw? Patient says she is not having symptoms, but is concerned if this could be dangerous due to her age. Patient asked for a call back after a response from [place]. Can be reached at mobile number

## 2023-06-22 ENCOUNTER — Other Ambulatory Visit: Payer: Self-pay | Admitting: Family Medicine

## 2023-06-22 NOTE — Telephone Encounter (Signed)
Let pt know: I do not suggest testing unless she has fever diarrhea vomiting or flulike symptoms.

## 2023-06-22 NOTE — Telephone Encounter (Signed)
Kristy [name] notified as instructed by telephone.  Patient states understanding.  

## 2023-07-31 ENCOUNTER — Encounter: Payer: Self-pay | Admitting: Family Medicine

## 2023-07-31 ENCOUNTER — Ambulatory Visit (INDEPENDENT_AMBULATORY_CARE_PROVIDER_SITE_OTHER): Payer: Medicare Other | Admitting: Family Medicine

## 2023-07-31 VITALS — BP 120/60 | HR 77 | Temp 99.0°F | Ht 62.0 in | Wt 174.2 lb

## 2023-07-31 DIAGNOSIS — J301 Allergic rhinitis due to pollen: Secondary | ICD-10-CM | POA: Diagnosis not present

## 2023-07-31 DIAGNOSIS — R509 Fever, unspecified: Secondary | ICD-10-CM | POA: Insufficient documentation

## 2023-07-31 DIAGNOSIS — R0981 Nasal congestion: Secondary | ICD-10-CM | POA: Diagnosis not present

## 2023-07-31 LAB — POC COVID19 BINAXNOW: SARS Coronavirus 2 Ag: NEGATIVE

## 2023-07-31 MED ORDER — AMOXICILLIN 500 MG PO CAPS: 1000.0000 mg | ORAL_CAPSULE | Freq: Two times a day (BID) | ORAL | 0 refills | Status: DC

## 2023-07-31 NOTE — Assessment & Plan Note (Signed)
Acute, symptoms concerning for bacterial infection given new fever after 1-1/2 weeks of nasal congestion.  Will treat with amoxicillin 500 mg 2 tablets twice daily for 10 days to cover for bacterial superinfection. COVID testing was negative in the office but if fever in the last 24 hours was day 1 of illness I have encouraged her to consider repeating testing day 4 from the fever to make sure she is not having allergies with secondary COVID infection.

## 2023-07-31 NOTE — Progress Notes (Signed)
Patient ID: Kristy [name], female    DOB: 06/30/1941, 82 y.o.   MRN: [id number]  This visit was conducted in person.  BP 120/60 (BP Location: Left Arm, Patient Position: Sitting, Cuff Size: Large)   Pulse 77   Temp 99 F (37.2 C) (Temporal)   Ht 5\' 2"  (1.575 m)   Wt 174 lb 4 oz (79 kg)   SpO2 97%   BMI 31.87 kg/m    CC:  Chief Complaint  Patient presents with   Nasal Congestion    Symptoms started 1 1/2 week ago   Cough   Fever    Subjective:   HPI: Kristy [name] is a 82 y.o. female presenting on 07/31/2023 for Nasal Congestion (Symptoms started 1 1/2 week ago), Cough, and Fever    Date of onset: 1.5 weeks ago Initial symptoms included nasal congestion she felt due to allergies Symptoms progressed to  increased nasal mucus production, sneeze, headache, post nasal drip and occ productive cough  No ear pain, no face pain Fever 100.2 F last night  No SOB, no wheeze.   Sick contacts:  none COVID testing:    none     She has tried to treat with  tylenol, flonase, nasal saline   No recent antibiotics  No history of chronic lung disease such as asthma or COPD. She does have history of hypertension, fibromuscular dysplasia, renal artery stenosis Non-smoker.       Relevant past medical, surgical, family and social history reviewed and updated as indicated. Interim medical history since our last visit reviewed. Allergies and medications reviewed and updated. Outpatient Medications Prior to Visit  Medication Sig Dispense Refill   aspirin EC 81 MG tablet Take 81 mg by mouth daily. Swallow whole.     atorvastatin (LIPITOR) 20 MG tablet TAKE 1 TABLET BY MOUTH EVERY DAY 90 tablet 3   losartan (COZAAR) 50 MG tablet TAKE 1 TABLET BY MOUTH EVERY DAY 90 tablet 3   metoprolol succinate (TOPROL-XL) 100 MG 24 hr tablet TAKE 1 TABLET BY MOUTH DAILY  WITH OR IMMEDIATELY FOLLOWING A  MEAL 100 tablet 2   Multiple Vitamin (MULTIVITAMIN WITH MINERALS) TABS tablet Take 2  tablets by mouth daily.     sodium chloride (OCEAN) 0.65 % SOLN nasal spray Place 1 spray into both nostrils every 4 (four) hours as needed for congestion.     tamoxifen (NOLVADEX) 20 MG tablet Take 1 tablet (20 mg total) by mouth daily. 90 tablet 3   triamterene-hydrochlorothiazide (MAXZIDE-25) 37.5-25 MG tablet TAKE 1 TABLET BY MOUTH DAILY 100 tablet 2   valACYclovir (VALTREX) 500 MG tablet Take 1 tablet (500 mg total) by mouth daily. Take daily for suppressive therapy 30 tablet 11   alendronate (FOSAMAX) 70 MG tablet Take 1 tablet (70 mg total) by mouth every 7 (seven) days. Take with a full glass of water on an empty stomach. (Patient not taking: Reported on 07/31/2023) 4 tablet 11   cholestyramine light (PREVALITE) 4 g packet Take 1 packet (4 g total) by mouth 2 (two) times daily. (Patient not taking: Reported on 03/15/2023) 60 each 0   Collagen Hydrolysate POWD 1 Scoop by Does not apply route daily.     No facility-administered medications prior to visit.     Per HPI unless specifically indicated in ROS section below Review of Systems  Constitutional:  Negative for fatigue and fever.  HENT:  Positive for postnasal drip, rhinorrhea, sneezing and sore throat. Negative for congestion, sinus  pressure, sinus pain and trouble swallowing.   Eyes:  Negative for pain.  Respiratory:  Positive for cough. Negative for shortness of breath.   Cardiovascular:  Negative for chest pain, palpitations and leg swelling.  Gastrointestinal:  Negative for abdominal pain.  Genitourinary:  Negative for dysuria and vaginal bleeding.  Musculoskeletal:  Negative for back pain.  Neurological:  Negative for syncope, light-headedness and headaches.  Psychiatric/Behavioral:  Negative for dysphoric mood.    Objective:  BP 120/60 (BP Location: Left Arm, Patient Position: Sitting, Cuff Size: Large)   Pulse 77   Temp 99 F (37.2 C) (Temporal)   Ht 5\' 2"  (1.575 m)   Wt 174 lb 4 oz (79 kg)   SpO2 97%   BMI 31.87  kg/m   Wt Readings from Last 3 Encounters:  07/31/23 174 lb 4 oz (79 kg)  04/26/23 171 lb 8 oz (77.8 kg)  03/15/23 172 lb (78 kg)      Physical Exam Constitutional:      General: She is not in acute distress.    Appearance: She is well-developed. She is not ill-appearing or toxic-appearing.  HENT:     Head: Normocephalic.     Right Ear: Hearing, tympanic membrane, ear canal and external ear normal. Tympanic membrane is not erythematous, retracted or bulging.     Left Ear: Hearing, tympanic membrane, ear canal and external ear normal. Tympanic membrane is not erythematous, retracted or bulging.     Nose: Mucosal edema and rhinorrhea present.     Right Sinus: No maxillary sinus tenderness or frontal sinus tenderness.     Left Sinus: No maxillary sinus tenderness or frontal sinus tenderness.     Mouth/Throat:     Mouth: Oropharynx is clear and moist and mucous membranes are normal.     Pharynx: Uvula midline.  Eyes:     General: Lids are normal. Lids are everted, no foreign bodies appreciated.     Extraocular Movements: EOM normal.     Conjunctiva/sclera: Conjunctivae normal.     Pupils: Pupils are equal, round, and reactive to light.  Neck:     Thyroid: No thyroid mass or thyromegaly.     Vascular: No carotid bruit.     Trachea: Trachea normal.  Cardiovascular:     Rate and Rhythm: Normal rate and regular rhythm.     Pulses: Normal pulses.     Heart sounds: Normal heart sounds, S1 normal and S2 normal. No murmur heard.    No friction rub. No gallop.  Pulmonary:     Effort: Pulmonary effort is normal. No tachypnea or respiratory distress.     Breath sounds: Normal breath sounds. No decreased breath sounds, wheezing, rhonchi or rales.  Musculoskeletal:     Cervical back: Normal range of motion and neck supple.  Skin:    General: Skin is warm, dry and intact.     Findings: No rash.  Neurological:     Mental Status: She is alert.  Psychiatric:        Mood and Affect: Mood is  not anxious or depressed.        Speech: Speech normal.        Behavior: Behavior normal. Behavior is cooperative.        Cognition and Memory: Cognition and memory normal.        Judgment: Judgment normal.       Results for orders placed or performed in visit on 04/19/23  Lipid panel  Result Value Ref Range  Cholesterol 124 0 - 200 mg/dL   Triglycerides 865.7 0.0 - 149.0 mg/dL   HDL 84.69 >62.95 mg/dL   VLDL 28.4 0.0 - 13.2 mg/dL   LDL Cholesterol 47 0 - 99 mg/dL   Total CHOL/HDL Ratio 3    NonHDL 74.21   Hemoglobin A1c  Result Value Ref Range   Hgb A1c MFr Bld 6.2 4.6 - 6.5 %  Comprehensive metabolic panel  Result Value Ref Range   Sodium 140 135 - 145 mEq/L   Potassium 4.0 3.5 - 5.1 mEq/L   Chloride 105 96 - 112 mEq/L   CO2 26 19 - 32 mEq/L   Glucose, Bld 112 (H) 70 - 99 mg/dL   BUN 14 6 - 23 mg/dL   Creatinine, Ser 4.40 0.40 - 1.20 mg/dL   Total Bilirubin 0.4 0.2 - 1.2 mg/dL   Alkaline Phosphatase 37 (L) 39 - 117 U/L   AST 19 0 - 37 U/L   ALT 14 0 - 35 U/L   Total Protein 6.9 6.0 - 8.3 g/dL   Albumin 4.0 3.5 - 5.2 g/dL   GFR 10.27 >25.36 mL/min   Calcium 9.5 8.4 - 10.5 mg/dL    Assessment and Plan  Nasal congestion -     POC COVID-19 BinaxNow  Fever, unspecified fever cause Assessment & Plan: Acute, symptoms concerning for bacterial infection given new fever after 1-1/2 weeks of nasal congestion.  Will treat with amoxicillin 500 mg 2 tablets twice daily for 10 days to cover for bacterial superinfection. COVID testing was negative in the office but if fever in the last 24 hours was day 1 of illness I have encouraged her to consider repeating testing day 4 from the fever to make sure she is not having allergies with secondary COVID infection.   Seasonal allergic rhinitis due to pollen Assessment & Plan: Chronic, poor control of allergies has resulted in baseline amount of excess mucus.  Recommended continuing nasal saline irrigation, Flonase 2 sprays per  nostril as tolerated and start Zyrtec or Xyzal at bedtime as oral antihistamine.   Other orders -     Amoxicillin; Take 2 capsules (1,000 mg total) by mouth 2 (two) times daily.  Dispense: 40 capsule; Refill: 0    No follow-ups on file.   Kerby Nora, MD

## 2023-07-31 NOTE — Patient Instructions (Signed)
Complete course of antibiotics.  Start Zyrtec or Xyzal at bedtime for allergies.  Continue nasal saline and flonase as tolerated.

## 2023-07-31 NOTE — Assessment & Plan Note (Signed)
Chronic, poor control of allergies has resulted in baseline amount of excess mucus.  Recommended continuing nasal saline irrigation, Flonase 2 sprays per nostril as tolerated and start Zyrtec or Xyzal at bedtime as oral antihistamine.

## 2023-08-26 ENCOUNTER — Other Ambulatory Visit: Payer: Self-pay | Admitting: Family Medicine

## 2023-08-28 ENCOUNTER — Ambulatory Visit: Payer: Medicare Other

## 2023-08-29 ENCOUNTER — Ambulatory Visit: Payer: Medicare Other | Admitting: Family Medicine

## 2023-08-29 ENCOUNTER — Encounter: Payer: Self-pay | Admitting: Family Medicine

## 2023-08-29 VITALS — BP 124/80 | HR 77 | Temp 98.6°F | Ht 62.0 in | Wt 175.0 lb

## 2023-08-29 DIAGNOSIS — M79674 Pain in right toe(s): Secondary | ICD-10-CM

## 2023-08-29 DIAGNOSIS — R531 Weakness: Secondary | ICD-10-CM | POA: Diagnosis not present

## 2023-08-29 DIAGNOSIS — R42 Dizziness and giddiness: Secondary | ICD-10-CM | POA: Diagnosis not present

## 2023-08-29 DIAGNOSIS — I10 Essential (primary) hypertension: Secondary | ICD-10-CM | POA: Diagnosis not present

## 2023-08-29 LAB — CBC WITH DIFFERENTIAL/PLATELET
Basophils Absolute: 0 10*3/uL (ref 0.0–0.1)
Basophils Relative: 0.6 % (ref 0.0–3.0)
Eosinophils Absolute: 0.1 10*3/uL (ref 0.0–0.7)
Eosinophils Relative: 1.3 % (ref 0.0–5.0)
HCT: 39.1 % (ref 36.0–46.0)
Hemoglobin: 13 g/dL (ref 12.0–15.0)
Lymphocytes Relative: 43.8 % (ref 12.0–46.0)
Lymphs Abs: 3 10*3/uL (ref 0.7–4.0)
MCHC: 33.3 g/dL (ref 30.0–36.0)
MCV: 98.2 fL (ref 78.0–100.0)
Monocytes Absolute: 0.6 10*3/uL (ref 0.1–1.0)
Monocytes Relative: 8.2 % (ref 3.0–12.0)
Neutro Abs: 3.1 10*3/uL (ref 1.4–7.7)
Neutrophils Relative %: 46.1 % (ref 43.0–77.0)
Platelets: 298 10*3/uL (ref 150.0–400.0)
RBC: 3.98 Mil/uL (ref 3.87–5.11)
RDW: 12.9 % (ref 11.5–15.5)
WBC: 6.8 10*3/uL (ref 4.0–10.5)

## 2023-08-29 LAB — COMPREHENSIVE METABOLIC PANEL
ALT: 20 U/L (ref 0–35)
AST: 21 U/L (ref 0–37)
Albumin: 4.3 g/dL (ref 3.5–5.2)
Alkaline Phosphatase: 48 U/L (ref 39–117)
BUN: 15 mg/dL (ref 6–23)
CO2: 30 meq/L (ref 19–32)
Calcium: 10.4 mg/dL (ref 8.4–10.5)
Chloride: 101 meq/L (ref 96–112)
Creatinine, Ser: 0.84 mg/dL (ref 0.40–1.20)
GFR: 65.71 mL/min (ref >60.00)
Glucose, Bld: 104 mg/dL — ABNORMAL HIGH (ref 70–99)
Potassium: 3.7 meq/L (ref 3.5–5.1)
Sodium: 140 meq/L (ref 135–145)
Total Bilirubin: 0.4 mg/dL (ref 0.2–1.2)
Total Protein: 6.6 g/dL (ref 6.0–8.3)

## 2023-08-29 LAB — TSH: TSH: 2.72 u[IU]/mL (ref 0.35–5.50)

## 2023-08-29 LAB — URIC ACID: Uric Acid, Serum: 5 mg/dL (ref 2.4–7.0)

## 2023-08-29 NOTE — Progress Notes (Signed)
Patient ID: Kristy [name], female    DOB: 03/08/1941, 82 y.o.   MRN: [id number]  This visit was conducted in person.  BP 124/80 (BP Location: Left Arm, Patient Position: Sitting, Cuff Size: Normal)   Pulse 77   Temp 98.6 F (37 C) (Oral)   Ht 5\' 2"  (1.575 m)   Wt 175 lb (79.4 kg)   SpO2 97%   BMI 32.01 kg/m    CC:  Chief Complaint  Patient presents with   Dizziness    X couple of days. Has had elevated BP couple of times    Subjective:   HPI: Kristy [name] is a 82 y.o. female presenting on 08/29/2023 for Dizziness (X couple of days. Has had elevated BP couple of times)    In last week dizziness when waking up... lasts few minutes.  Notes when sits up.  Feels like head in a fog.  Noting dizziness when bending over.   No bleeding.  No new medication.   BP yesterday 167/65.  Right side of entire body felt funny... no numbness, felt weak...  took med and ASA... improved after taking an extra losartan and BP decreased to 106 systolic.   Not regularly  taking aspirin.  BP Readings from Last 3 Encounters:  08/29/23 124/80  07/31/23 120/60  04/26/23 134/76       Some right knee issue for OA.  Some achyness in right great toe.. no redness, mild heat and some swelling.  She did drop something on that toe months ago.    Called to make follow up appt with vascular MD  Relevant past medical, surgical, family and social history reviewed and updated as indicated. Interim medical history since our last visit reviewed. Allergies and medications reviewed and updated. Outpatient Medications Prior to Visit  Medication Sig Dispense Refill   alendronate (FOSAMAX) 70 MG tablet Take 1 tablet (70 mg total) by mouth every 7 (seven) days. Take with a full glass of water on an empty stomach. 4 tablet 11   aspirin EC 81 MG tablet Take 81 mg by mouth daily. Swallow whole.     atorvastatin (LIPITOR) 20 MG tablet TAKE 1 TABLET BY MOUTH EVERY DAY 90 tablet 3   losartan (COZAAR)  50 MG tablet TAKE 1 TABLET BY MOUTH EVERY DAY 90 tablet 3   metoprolol succinate (TOPROL-XL) 100 MG 24 hr tablet TAKE 1 TABLET BY MOUTH DAILY  WITH OR IMMEDIATELY FOLLOWING A  MEAL 100 tablet 2   Multiple Vitamin (MULTIVITAMIN WITH MINERALS) TABS tablet Take 2 tablets by mouth daily.     sodium chloride (OCEAN) 0.65 % SOLN nasal spray Place 1 spray into both nostrils every 4 (four) hours as needed for congestion.     tamoxifen (NOLVADEX) 20 MG tablet Take 1 tablet (20 mg total) by mouth daily. 90 tablet 3   triamterene-hydrochlorothiazide (MAXZIDE-25) 37.5-25 MG tablet TAKE 1 TABLET BY MOUTH DAILY 100 tablet 2   valACYclovir (VALTREX) 500 MG tablet Take 1 tablet (500 mg total) by mouth daily. Take daily for suppressive therapy 30 tablet 11   amoxicillin (AMOXIL) 500 MG capsule Take 2 capsules (1,000 mg total) by mouth 2 (two) times daily. 40 capsule 0   No facility-administered medications prior to visit.     Per HPI unless specifically indicated in ROS section below Review of Systems  Constitutional:  Negative for fatigue and fever.  HENT:  Negative for congestion.   Eyes:  Negative for pain.  Respiratory:  Negative  for cough and shortness of breath.   Cardiovascular:  Negative for chest pain, palpitations and leg swelling.  Gastrointestinal:  Negative for abdominal pain.  Genitourinary:  Negative for dysuria and vaginal bleeding.  Musculoskeletal:  Negative for back pain.  Neurological:  Negative for syncope, light-headedness and headaches.  Psychiatric/Behavioral:  Negative for dysphoric mood.    Objective:  BP 124/80 (BP Location: Left Arm, Patient Position: Sitting, Cuff Size: Normal)   Pulse 77   Temp 98.6 F (37 C) (Oral)   Ht 5\' 2"  (1.575 m)   Wt 175 lb (79.4 kg)   SpO2 97%   BMI 32.01 kg/m   Wt Readings from Last 3 Encounters:  08/29/23 175 lb (79.4 kg)  07/31/23 174 lb 4 oz (79 kg)  04/26/23 171 lb 8 oz (77.8 kg)      Physical Exam Constitutional:      General:  She is not in acute distress.    Appearance: Normal appearance. She is well-developed. She is not ill-appearing or toxic-appearing.  HENT:     Head: Normocephalic.     Right Ear: Hearing, tympanic membrane, ear canal and external ear normal. Tympanic membrane is not erythematous, retracted or bulging.     Left Ear: Hearing, tympanic membrane, ear canal and external ear normal. Tympanic membrane is not erythematous, retracted or bulging.     Nose: No mucosal edema or rhinorrhea.     Right Sinus: No maxillary sinus tenderness or frontal sinus tenderness.     Left Sinus: No maxillary sinus tenderness or frontal sinus tenderness.     Mouth/Throat:     Mouth: Oropharynx is clear and moist and mucous membranes are normal.     Pharynx: Uvula midline.  Eyes:     General: Lids are normal. Lids are everted, no foreign bodies appreciated.     Extraocular Movements: EOM normal.     Conjunctiva/sclera: Conjunctivae normal.     Pupils: Pupils are equal, round, and reactive to light.  Neck:     Thyroid: No thyroid mass or thyromegaly.     Vascular: No carotid bruit.     Trachea: Trachea normal.  Cardiovascular:     Rate and Rhythm: Normal rate and regular rhythm.     Pulses: Normal pulses.     Heart sounds: Normal heart sounds, S1 normal and S2 normal. No murmur heard.    No friction rub. No gallop.  Pulmonary:     Effort: Pulmonary effort is normal. No tachypnea or respiratory distress.     Breath sounds: Normal breath sounds. No decreased breath sounds, wheezing, rhonchi or rales.  Abdominal:     General: Bowel sounds are normal.     Palpations: Abdomen is soft.     Tenderness: There is no abdominal tenderness.  Musculoskeletal:     Cervical back: Normal range of motion and neck supple.     Comments: Tenderness and swelling in her right great toe MCP joint, no redness no heat  Skin:    General: Skin is warm, dry and intact.     Findings: No rash.  Neurological:     Mental Status: She is  alert and oriented to person, place, and time.     GCS: GCS eye subscore is 4. GCS verbal subscore is 5. GCS motor subscore is 6.     Cranial Nerves: No cranial nerve deficit.     Sensory: No sensory deficit.     Motor: No abnormal muscle tone.     Coordination: Coordination normal.  Gait: Gait normal.     Deep Tendon Reflexes: Reflexes are normal and symmetric.     Comments: Nml cerebellar exam   No papilledema  Psychiatric:        Mood and Affect: Mood and affect normal. Mood is not anxious or depressed.        Speech: Speech normal.        Behavior: Behavior normal. Behavior is cooperative.        Thought Content: Thought content normal.        Cognition and Memory: Cognition and memory normal. Memory is not impaired. She does not exhibit impaired recent memory or impaired remote memory.        Judgment: Judgment normal.       Results for orders placed or performed in visit on 07/31/23  POC COVID-19  Result Value Ref Range   SARS Coronavirus 2 Ag Negative Negative    Assessment and Plan  Episodic lightheadedness Assessment & Plan: Acute, not clearly consistent with vertigo.  Orthostatics done in office today are normal.  Dizziness appeared associated with hypotensive episode and resolved with better control of blood pressure.  Will evaluate labs for secondary causes.  Orders: -     Comprehensive metabolic panel -     CBC with Differential/Platelet -     TSH  Right sided weakness Assessment & Plan: Acute, 1 episode of this that could potentially be a mini stroke.  Discussed the lack of utility of MRIs in TIA. Recommended her going ahead and starting daily baby aspirin 81 mg.  She continues on statin and cholesterol is well-controlled May/2024. She will follow-up with her vascular doctor for possible further evaluation of her carotids and fibromuscular dysplasia. No arrhythmia noted on cardiovascular exam.      Great toe pain, right Assessment & Plan: Acute,  most likely arthritis pain but will evaluate uric acid to consider gout.  Orders: -     Uric acid  HYPERTENSION, BENIGN ESSENTIAL, LABILE Assessment & Plan: Chronic, blood pressure spikes seems to be associated with her symptoms.  She will continue to follow blood pressure prior to taking her medication to make sure that we do not need to increase the dose of losartan.  She will call with blood pressure measurements in the next few days.     No follow-ups on file.   Kerby Nora, MD

## 2023-08-29 NOTE — Assessment & Plan Note (Signed)
Acute, not clearly consistent with vertigo.  Orthostatics done in office today are normal.  Dizziness appeared associated with hypotensive episode and resolved with better control of blood pressure.  Will evaluate labs for secondary causes.

## 2023-08-29 NOTE — Assessment & Plan Note (Signed)
Acute, 1 episode of this that could potentially be a mini stroke.  Discussed the lack of utility of MRIs in TIA. Recommended her going ahead and starting daily baby aspirin 81 mg.  She continues on statin and cholesterol is well-controlled May/2024. She will follow-up with her vascular doctor for possible further evaluation of her carotids and fibromuscular dysplasia. No arrhythmia noted on cardiovascular exam.

## 2023-08-29 NOTE — Patient Instructions (Addendum)
Please stop at the lab to have labs drawn.  Check BP over next few days prior to taking medication and  several hours after.  Call with measurements at end of week. Go to ER if new neurologic symptoms symptoms such as recurrent weakness, numbness. If labs are normal and still feeling issues or BP elevated we will increase losartan to 100 mg daily.

## 2023-08-29 NOTE — Assessment & Plan Note (Signed)
Chronic, blood pressure spikes seems to be associated with her symptoms.  She will continue to follow blood pressure prior to taking her medication to make sure that we do not need to increase the dose of losartan.  She will call with blood pressure measurements in the next few days.

## 2023-08-29 NOTE — Assessment & Plan Note (Signed)
Acute, most likely arthritis pain but will evaluate uric acid to consider gout.

## 2023-08-30 ENCOUNTER — Ambulatory Visit: Payer: Medicare Other | Admitting: Physician Assistant

## 2023-08-30 ENCOUNTER — Telehealth: Payer: Self-pay | Admitting: Family Medicine

## 2023-08-30 NOTE — Telephone Encounter (Signed)
Patient called, said she was told to give a blood pressure update before and after taking her medication. Patient says at 10:05 am before meds, it was 125/67. At 12:03 pm it was 124/65. Patient says she took the medication around 11:00 am. Please advise patient if needed.

## 2023-08-30 NOTE — Telephone Encounter (Signed)
Call patient, let her know that blood pressure is sounding good as long as it remains stable lets hold off on additional medication at this point.

## 2023-08-31 NOTE — Telephone Encounter (Signed)
Contacted pt and relayed information provided. Pt understood/verbalized understanding and has no questions or concerns.

## 2023-09-05 ENCOUNTER — Telehealth: Payer: Self-pay

## 2023-09-05 NOTE — Patient Outreach (Signed)
Care Coordination   09/05/2023 Name: Kristy [name] MRN: [id number] DOB: February 07, 1941   Care Coordination Outreach Attempts:  Successful contact made with patient.   Patient states she is currently not at home and request a return call to further discuss care coordination services.   Follow Up Plan:  Additional outreach attempts will be made to offer the patient care coordination information and services.   Encounter Outcome:  Patient Request to Call Back   Care Coordination Interventions:  No, not indicated    George Ina [facility] Care Coordination [phone] direct line

## 2023-09-25 ENCOUNTER — Other Ambulatory Visit: Payer: Self-pay | Admitting: Hematology and Oncology

## 2023-09-25 DIAGNOSIS — Z1231 Encounter for screening mammogram for malignant neoplasm of breast: Secondary | ICD-10-CM

## 2023-10-01 ENCOUNTER — Other Ambulatory Visit: Payer: Self-pay | Admitting: Hematology and Oncology

## 2023-10-01 ENCOUNTER — Other Ambulatory Visit: Payer: Self-pay

## 2023-10-01 MED ORDER — TAMOXIFEN CITRATE 20 MG PO TABS: 20.0000 mg | ORAL_TABLET | Freq: Every day | ORAL | 3 refills | Status: DC

## 2023-10-01 NOTE — Telephone Encounter (Signed)
Called Kristy [name] back to let her know that her refill had been sent in to [facility] for mail delivery. Lorayne Marek, RN

## 2023-10-03 ENCOUNTER — Other Ambulatory Visit (INDEPENDENT_AMBULATORY_CARE_PROVIDER_SITE_OTHER): Payer: Self-pay | Admitting: Vascular Surgery

## 2023-10-03 DIAGNOSIS — I6523 Occlusion and stenosis of bilateral carotid arteries: Secondary | ICD-10-CM

## 2023-10-03 DIAGNOSIS — I701 Atherosclerosis of renal artery: Secondary | ICD-10-CM

## 2023-10-08 ENCOUNTER — Ambulatory Visit (INDEPENDENT_AMBULATORY_CARE_PROVIDER_SITE_OTHER): Payer: Medicare Other

## 2023-10-08 ENCOUNTER — Encounter (INDEPENDENT_AMBULATORY_CARE_PROVIDER_SITE_OTHER): Payer: Self-pay | Admitting: Vascular Surgery

## 2023-10-08 ENCOUNTER — Ambulatory Visit (INDEPENDENT_AMBULATORY_CARE_PROVIDER_SITE_OTHER): Payer: Medicare Other | Admitting: Vascular Surgery

## 2023-10-08 VITALS — BP 145/77 | HR 65 | Resp 18 | Ht 62.0 in | Wt 176.2 lb

## 2023-10-08 DIAGNOSIS — I6523 Occlusion and stenosis of bilateral carotid arteries: Secondary | ICD-10-CM

## 2023-10-08 DIAGNOSIS — I10 Essential (primary) hypertension: Secondary | ICD-10-CM | POA: Diagnosis not present

## 2023-10-08 DIAGNOSIS — I773 Arterial fibromuscular dysplasia: Secondary | ICD-10-CM

## 2023-10-08 DIAGNOSIS — M1990 Unspecified osteoarthritis, unspecified site: Secondary | ICD-10-CM | POA: Insufficient documentation

## 2023-10-08 DIAGNOSIS — I701 Atherosclerosis of renal artery: Secondary | ICD-10-CM

## 2023-10-08 DIAGNOSIS — M150 Primary generalized (osteo)arthritis: Secondary | ICD-10-CM | POA: Diagnosis not present

## 2023-10-08 NOTE — Progress Notes (Signed)
MRN : [id number]  Kristy [name] is a 82 y.o. (05-26-1941) female who presents with chief complaint of check circulation.  History of Present Illness:   The patient presents today for follow-up of renal artery stenosis as well as carotid artery stenosis.  The patient also has a history of fibromuscular dysplasia.  The patient denies any major issues with her blood pressure.  She notes it is slightly elevated today but it is always normal at home.  She denies claudication-like symptoms.  Patient is also active daily.  She notes that she wears compression socks.  Interval change in her overall health care: She has been having increased creased problems with her right knee and will need a total knee replacement   There have been no noted TIA or CVA-like symptoms.  Overall the patient has been doing well.   Carotid duplex done today shows no evidence of stenosis in the bilateral carotid arteries.  The bilateral vertebral arteries demonstrate antegrade flow with normal flow hemodynamics in the bilateral subclavian arteries.   Renal artery duplex done today shows no evidence of stenosis in the left renal artery.  The right renal artery has a 1 to 59% stenosis.  The bilateral kidney size is normal.  The resistive index is normal bilaterally.  The cortical thickness is normal bilaterally.  No significant change from last years study.  Current Meds  Medication Sig   alendronate (FOSAMAX) 70 MG tablet Take 1 tablet (70 mg total) by mouth every 7 (seven) days. Take with a full glass of water on an empty stomach.   aspirin EC 81 MG tablet Take 81 mg by mouth daily. Swallow whole.   atorvastatin (LIPITOR) 20 MG tablet TAKE 1 TABLET BY MOUTH EVERY DAY   losartan (COZAAR) 50 MG tablet TAKE 1 TABLET BY MOUTH EVERY DAY   metoprolol succinate (TOPROL-XL) 100 MG 24 hr tablet TAKE 1 TABLET BY MOUTH DAILY  WITH OR IMMEDIATELY FOLLOWING A   MEAL   Multiple Vitamin (MULTIVITAMIN WITH MINERALS) TABS tablet Take 2 tablets by mouth daily.   sodium chloride (OCEAN) 0.65 % SOLN nasal spray Place 1 spray into both nostrils every 4 (four) hours as needed for congestion.   tamoxifen (NOLVADEX) 20 MG tablet Take 1 tablet (20 mg total) by mouth daily.   triamterene-hydrochlorothiazide (MAXZIDE-25) 37.5-25 MG tablet TAKE 1 TABLET BY MOUTH DAILY   valACYclovir (VALTREX) 500 MG tablet Take 1 tablet (500 mg total) by mouth daily. 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   Constipation    uses stool softener PRN- uses once a week to once every 2 weeks  Fibromuscular dysplasia (HCC)    Gallbladder disease    Hyperlipidemia    Hypertension    For 16 years; urinary catecholamines within normal limits 12/09; renal Doppler ultrasound showed no evidence for renal artery stenosis   Normal echocardiogram 02/2009   LVEF 65%; no regional wall motion abnormalities; normal RV size and function; pulmonic valve had increased gradient across w/ peak gradient of about 36 mmHg (range of moderate pulmonic stenosis) followup showed normal valve   Papilloma of breast    right   Personal history of radiation therapy    Tinnitus of left ear    Tubular adenoma of colon 10/2013    Past Surgical History:  Procedure Laterality Date   ANGIOPLASTY     2012   BREAST BIOPSY Right 2006   benign   BREAST BIOPSY Right 11/09/2020   BREAST BIOPSY Right 11/23/2020   BREAST BIOPSY Right 11/29/2020   x2   BREAST EXCISIONAL BIOPSY Right 07/2017   benign   BREAST LUMPECTOMY  Right 12/16/2020   BREAST LUMPECTOMY WITH RADIOACTIVE SEED LOCALIZATION Right 08/14/2017   Procedure: RIGHT BREAST LUMPECTOMY WITH RADIOACTIVE SEED LOCALIZATION;  Surgeon: Harriette Bouillon, MD;  Location: [facility];  Service: General;  Laterality: Right;   BREAST LUMPECTOMY WITH RADIOACTIVE SEED LOCALIZATION Right 12/16/2020   Procedure: RADIOACTIVE SEED GUIDED TIMES 3 RIGHT BREAST LUMPECTOMY;  Surgeon: Harriette Bouillon, MD;  Location: [facility];  Service: General;  Laterality: Right;   Cardiolyte  09/2006   Neg   CHOLECYSTECTOMY N/A 09/22/2021   Procedure: LAPAROSCOPIC CHOLECYSTECTOMY WITH  INTRAOPERATIVE CHOLANGIOGRAM;  Surgeon: Harriette Bouillon, MD;  Location: [facility];  Service: General;  Laterality: N/A;   COLONOSCOPY     KNEE CARTILAGE SURGERY     right knee   POLYPECTOMY     RE-EXCISION OF BREAST LUMPECTOMY Right 01/11/2021   Procedure: RE-EXCISION OF RIGHT BREAST LUMPECTOMY;  Surgeon: Harriette Bouillon, MD;  Location: [facility];  Service: General;  Laterality: Right;   TOTAL ABDOMINAL HYSTERECTOMY     no cervix   UPPER GASTROINTESTINAL ENDOSCOPY      Social History Social History   Tobacco Use   Smoking status: Former    Current packs/day: 0.00    Average packs/day: 1 pack/day for 15.0 years (15.0 ttl pk-yrs)    Types: Cigarettes    Start date: 11/21/1967    Quit date: 11/20/1982    Years since quitting: 40.9   Smokeless tobacco: Never   Tobacco comments:    15 pack year history  Vaping Use   Vaping status: Never Used  Substance Use Topics   Alcohol use: No   Drug use: No    Family History Family History  Problem Relation Age of Onset   Prostate cancer Father    Lung cancer Father    Colon cancer Maternal Uncle    Colon cancer Paternal Uncle    Colon polyps Sister    Coronary artery disease Paternal Grandmother    Goiter Other        ?   Esophageal cancer Neg Hx    Stomach cancer Neg Hx    Rectal cancer Neg Hx     Bladder Cancer Neg Hx    Kidney cancer Neg Hx     Allergies  Allergen Reactions   Vibramycin [Doxycycline]     Many years ago - "made me feel funny"     REVIEW OF SYSTEMS (Negative unless checked)  Constitutional: [] Weight loss  [] Fever  [] Chills Cardiac: []   Chest pain   [] Chest pressure   [] Palpitations   [] Shortness of breath when laying flat   [] Shortness of breath with exertion. Vascular:  [x] Pain in legs with walking   [] Pain in legs at rest  [] History of DVT   [] Phlebitis   [] Swelling in legs   [] Varicose veins   [] Non-healing ulcers Pulmonary:   [] Uses home oxygen   [] Productive cough   [] Hemoptysis   [] Wheeze  [] COPD   [] Asthma Neurologic:  [] Dizziness   [] Seizures   [] History of stroke   [] History of TIA  [] Aphasia   [] Vissual changes   [] Weakness or numbness in arm   [] Weakness or numbness in leg Musculoskeletal:   [] Joint swelling   [] Joint pain   [] Low back pain Hematologic:  [] Easy bruising  [] Easy bleeding   [] Hypercoagulable state   [] Anemic Gastrointestinal:  [] Diarrhea   [] Vomiting  [] Gastroesophageal reflux/heartburn   [] Difficulty swallowing. Genitourinary:  [] Chronic kidney disease   [] Difficult urination  [] Frequent urination   [] Blood in urine Skin:  [] Rashes   [] Ulcers  Psychological:  [] History of anxiety   []  History of major depression.  Physical Examination  Vitals:   10/08/23 1142 10/08/23 1143  BP: (!) 152/83 (!) 145/77  Pulse: 68 65  Resp: 18 18  Weight: 176 lb 3.2 oz (79.9 kg)   Height: 5\' 2"  (1.575 m)    Body mass index is 32.23 kg/m. Gen: WD/WN, NAD Head: [place]/AT, No temporalis wasting.  Ear/Nose/Throat: Hearing grossly intact, nares w/o erythema or drainage Eyes: PER, EOMI, sclera nonicteric.  Neck: Supple, no masses.  No bruit or JVD.  Pulmonary:  Good air movement, no audible wheezing, no use of accessory muscles.  Cardiac: RRR, normal S1, S2, no Murmurs. Vascular:  No trophic changes, no open wounds Vessel Right Left  Radial Palpable  Palpable  Gastrointestinal: soft, non-distended. No guarding/no peritoneal signs.  Musculoskeletal: M/S 5/5 throughout.  No visible deformity.  Neurologic: CN 2-12 intact. Pain and light touch intact in extremities.  Symmetrical.  Speech is fluent. Motor exam as listed above. Psychiatric: Judgment intact, Mood & affect appropriate for pt's clinical situation. Dermatologic: No rashes or ulcers noted.  No changes consistent with cellulitis.   CBC Lab Results  Component Value Date   WBC 6.8 08/29/2023   HGB 13.0 08/29/2023   HCT 39.1 08/29/2023   MCV 98.2 08/29/2023   PLT 298.0 08/29/2023    BMET    Component Value Date/Time   NA 140 08/29/2023 1314   K 3.7 08/29/2023 1314   CL 101 08/29/2023 1314   CO2 30 08/29/2023 1314   GLUCOSE 104 (H) 08/29/2023 1314   BUN 15 08/29/2023 1314   CREATININE 0.84 08/29/2023 1314   CALCIUM 10.4 08/29/2023 1314   GFRNONAA 54 (L) 08/21/2022 1330   GFRAA >60 08/09/2017 1300   CrCl cannot be calculated (Patient's most recent lab result is older than the maximum 21 days allowed.).  COAG No results found for: "INR", "PROTIME"  Radiology No results found.   Assessment/Plan 1. Fibromuscular dysplasia (HCC) Given patient's fibromuscular dysplasia optimal control of the patient's hypertension is important.  BP is acceptable today.  And she remained symptom-free regarding the previously documented FMD of the carotid arteries.  In both the carotid and renal artery distributions the FMD appears to be very mild.  Nevertheless FMD Progress and therefore continued monitoring is important.   The patient's vital signs and noninvasive studies support the renal artery stenosis that is not significantly increased when compared to the  previous study (in fact it might be slightly improved on the right compared to previous study).   No invasive studies or intervention is indicated at this time.   The patient will continue the current antihypertensive  medications, no changes at this time.   The primary medical service will continue aggressive antihypertensive therapy as per the AHA guidelines   - VAS US RENAL ARTERY DUPLEX; Future - VAS US CAROTID; Future  2. Renal artery stenosis (HCC) Given patient's fibromuscular dysplasia optimal control of the patient's hypertension is important.  BP is acceptable today.  And she remained symptom-free regarding the previously documented FMD of the carotid arteries.  In both the carotid and renal artery distributions the FMD appears to be very mild.  Nevertheless FMD Progress and therefore continued monitoring is important.   The patient's vital signs and noninvasive studies support the renal artery stenosis that is not significantly increased when compared to the previous study (in fact it might be slightly improved on the right compared to previous study).   No invasive studies or intervention is indicated at this time.   The patient will continue the current antihypertensive medications, no changes at this time.   The primary medical service will continue aggressive antihypertensive therapy as per the AHA guidelines  - VAS US RENAL ARTERY DUPLEX; Future  3. Bilateral carotid artery stenosis Given patient's fibromuscular dysplasia optimal control of the patient's hypertension is important.  BP is acceptable today.  And she remained symptom-free regarding the previously documented FMD of the carotid arteries.  In both the carotid and renal artery distributions the FMD appears to be very mild.  Nevertheless FMD Progress and therefore continued monitoring is important.   The patient's vital signs and noninvasive studies support the renal artery stenosis that is not significantly increased when compared to the previous study (in fact it might be slightly improved on the right compared to previous study).   No invasive studies or intervention is indicated at this time.   The patient will continue  the current antihypertensive medications, no changes at this time.   The primary medical service will continue aggressive antihypertensive therapy as per the AHA guidelines  - VAS US CAROTID; Future  4. HYPERTENSION, BENIGN ESSENTIAL, LABILE Continue antihypertensive medications as already ordered, these medications have been reviewed and there are no changes at this time.  5. Primary osteoarthritis involving multiple joints The patient has severe degenerative changes of the right knee and is undergoing evaluation for possible total knee replacement.  From the vascular standpoint she is cleared for the surgery.  No preoperative invasive testing or further workup is indicated at this time.    Continue medications to treat the patient's degenerative disease as already ordered, these medications have been reviewed and there are no changes at this time.  Continued activity and therapy was stressed.    Levora Dredge, MD  10/08/2023 11:45 AM

## 2023-10-27 ENCOUNTER — Telehealth: Payer: Self-pay | Admitting: Hematology and Oncology

## 2023-10-27 NOTE — Telephone Encounter (Signed)
Spoke with patient confirming upcoming appointment change

## 2023-11-02 ENCOUNTER — Ambulatory Visit
Admission: RE | Admit: 2023-11-02 | Discharge: 2023-11-02 | Disposition: A | Discharge status: Home / Self Care | Payer: Medicare Other | Source: Ambulatory Visit | Attending: Hematology and Oncology | Admitting: Hematology and Oncology

## 2023-11-02 ENCOUNTER — Ambulatory Visit: Payer: Medicare Other | Admitting: Hematology and Oncology

## 2023-11-02 DIAGNOSIS — Z1231 Encounter for screening mammogram for malignant neoplasm of breast: Secondary | ICD-10-CM | POA: Diagnosis not present

## 2023-11-12 ENCOUNTER — Other Ambulatory Visit: Payer: Self-pay

## 2023-11-12 ENCOUNTER — Encounter (HOSPITAL_COMMUNITY): Payer: Self-pay

## 2023-11-12 ENCOUNTER — Emergency Department (HOSPITAL_COMMUNITY)
Admission: EM | Admit: 2023-11-12 | Discharge: 2023-11-12 | Disposition: A | Discharge status: Home / Self Care | Payer: Medicare Other | Attending: Emergency Medicine | Admitting: Emergency Medicine

## 2023-11-12 ENCOUNTER — Ambulatory Visit: Payer: Self-pay | Admitting: Family Medicine

## 2023-11-12 ENCOUNTER — Emergency Department (HOSPITAL_COMMUNITY): Payer: Medicare Other

## 2023-11-12 DIAGNOSIS — R202 Paresthesia of skin: Secondary | ICD-10-CM | POA: Insufficient documentation

## 2023-11-12 DIAGNOSIS — R42 Dizziness and giddiness: Secondary | ICD-10-CM | POA: Diagnosis not present

## 2023-11-12 DIAGNOSIS — I10 Essential (primary) hypertension: Secondary | ICD-10-CM | POA: Diagnosis not present

## 2023-11-12 DIAGNOSIS — R1013 Epigastric pain: Secondary | ICD-10-CM | POA: Insufficient documentation

## 2023-11-12 DIAGNOSIS — R109 Unspecified abdominal pain: Secondary | ICD-10-CM | POA: Diagnosis not present

## 2023-11-12 DIAGNOSIS — Z7982 Long term (current) use of aspirin: Secondary | ICD-10-CM | POA: Insufficient documentation

## 2023-11-12 LAB — COMPREHENSIVE METABOLIC PANEL
ALT: 28 U/L (ref 0–44)
AST: 29 U/L (ref 15–41)
Albumin: 4.1 g/dL (ref 3.5–5.0)
Alkaline Phosphatase: 42 U/L (ref 38–126)
Anion gap: 9 (ref 5–15)
BUN: 14 mg/dL (ref 8–23)
CO2: 25 mmol/L (ref 22–32)
Calcium: 9.7 mg/dL (ref 8.9–10.3)
Chloride: 103 mmol/L (ref 98–111)
Creatinine, Ser: 0.83 mg/dL (ref 0.44–1.00)
GFR, Estimated: >60 mL/min (ref >60)
Glucose, Bld: 125 mg/dL — ABNORMAL HIGH (ref 70–99)
Potassium: 3.4 mmol/L — ABNORMAL LOW (ref 3.5–5.1)
Sodium: 137 mmol/L (ref 135–145)
Total Bilirubin: 0.5 mg/dL (ref <1.2)
Total Protein: 7.2 g/dL (ref 6.5–8.1)

## 2023-11-12 LAB — URINALYSIS, ROUTINE W REFLEX MICROSCOPIC
Bilirubin Urine: NEGATIVE
Glucose, UA: NEGATIVE mg/dL
Ketones, ur: NEGATIVE mg/dL
Leukocytes,Ua: NEGATIVE
Nitrite: NEGATIVE
Protein, ur: NEGATIVE mg/dL
Specific Gravity, Urine: 1.006 (ref 1.005–1.030)
pH: 7 (ref 5.0–8.0)

## 2023-11-12 LAB — CBC
HCT: 36.8 % (ref 36.0–46.0)
Hemoglobin: 12.7 g/dL (ref 12.0–15.0)
MCH: 33.3 pg (ref 26.0–34.0)
MCHC: 34.5 g/dL (ref 30.0–36.0)
MCV: 96.6 fL (ref 80.0–100.0)
Platelets: 280 10*3/uL (ref 150–400)
RBC: 3.81 MIL/uL — ABNORMAL LOW (ref 3.87–5.11)
RDW: 12.8 % (ref 11.5–15.5)
WBC: 6.2 10*3/uL (ref 4.0–10.5)
nRBC: 0 % (ref 0.0–0.2)

## 2023-11-12 LAB — CBG MONITORING, ED: Glucose-Capillary: 109 mg/dL — ABNORMAL HIGH (ref 70–99)

## 2023-11-12 NOTE — ED Notes (Signed)
Pt. Was able to ambulate to the restroom without assistance.

## 2023-11-12 NOTE — Discharge Instructions (Signed)
You are seen in the urgency room for dizziness, elevated blood pressure and abdominal discomfort.  The workup in the emergency room is reassuring.  We recommend that you keep track of your blood pressure and have a PCP visit to discuss episodic dizziness and your blood pressure.  We have put in phone number for the GI doctors.  Please call them, to see them about your abdominal discomfort.

## 2023-11-12 NOTE — Telephone Encounter (Signed)
Noted  

## 2023-11-12 NOTE — ED Provider Triage Note (Signed)
Emergency Medicine Provider Triage Evaluation Note  Kristy [name] , a 82 y.o. female  was evaluated in triage.  Pt complains of elevated blood pressure, "funny" feeling in her left arm.  That sensation has resolved.  No weakness, no speech difficulty, no facial asymmetry.  Complains of dizziness which has been ongoing for couple weeks and has been intermittent.  Review of Systems  Positive: As above Negative: As above  Physical Exam  BP (!) 194/77 (BP Location: Left Arm)   Pulse 96   Temp 98.3 F (36.8 C) (Oral)   Resp 18   Ht 5\' 2"  (1.575 m)   Wt 78 kg   SpO2 100%   BMI 31.46 kg/m  Gen:   Awake, no distress   Resp:  Normal effort MSK:   Moves extremities without difficulty  Other:    Medical Decision Making  Medically screening exam initiated at 12:29 PM.  Appropriate orders placed.  Kristy [name] was informed that the remainder of the evaluation will be completed by another provider, this initial triage assessment does not replace that evaluation, and the importance of remaining in the ED until their evaluation is complete.     Kristy [place], PA-C 11/12/23 1229

## 2023-11-12 NOTE — ED Triage Notes (Signed)
Pt states intermittent dizziness for the last week. Today at 1125 her left arm "felt different" she checked BP and it was in the 200s. Pt took home BP medication as prescribed. Denies weakness. Denies sensation changes. NIHSS 0 in triage.

## 2023-11-12 NOTE — ED Provider Notes (Signed)
[facility] AT [facility] Provider Note   CSN: [id number] Arrival date & time: 11/12/23  1157     History  Chief Complaint  Patient presents with   Dizziness   Tingling   Hypertension    Kristy [name] is a 82 y.o. female.  HPI    82 year old female comes in with chief complaint of elevated blood pressure, dizziness.  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  Patient states that over the last few days she has had episodes of dizziness.  Typically her symptoms are provoked with movement or activity.  This morning she was having some abdominal discomfort.  Thereafter, she had leaned forward to grab something, when she got up she felt profoundly dizzy.  The episodes of dizziness described as lightheadedness, near fainting almost lasted for about 10 minutes.  Patient sat down, checked her blood pressure and was over 200 systolic which prompted her to come to the ER.   Patient states that over the last several months she also has some epigastric abdominal discomfort.  Patient was recently seen by her vascular doctors and they told her that her carotid Dopplers were normal and kidney Dopplers were normal.  Patient in general has well-controlled blood pressure, but she has noted intermittent spikes.  Currently patient does not have any dizziness.  At no point did patient have associated dysarthria, diplopia, dysphagia, one-sided weakness or numbness. Home Medications Prior to Admission medications   Medication Sig Start Date End Date Taking? Authorizing Provider  alendronate (FOSAMAX) 70 MG tablet Take 1 tablet (70 mg total) by mouth every 7 (seven) days. Take with a full glass of water on an empty stomach. 05/02/23   Bedsole, Amy E, MD  aspirin EC 81 MG tablet Take 81 mg by mouth daily. Swallow whole.    [provider]  atorvastatin (LIPITOR) 20 MG tablet TAKE 1  TABLET BY MOUTH EVERY DAY 06/08/23   Bedsole, Amy E, MD  losartan (COZAAR) 50 MG tablet TAKE 1 TABLET BY MOUTH EVERY DAY 06/15/23   Bedsole, Amy E, MD  metoprolol succinate (TOPROL-XL) 100 MG 24 hr tablet TAKE 1 TABLET BY MOUTH DAILY  WITH OR IMMEDIATELY FOLLOWING A  MEAL 05/10/23   Bedsole, Amy E, MD  Multiple Vitamin (MULTIVITAMIN WITH MINERALS) TABS tablet Take 2 tablets by mouth daily.    [provider]  sodium chloride (OCEAN) 0.65 % SOLN nasal spray Place 1 spray into both nostrils every 4 (four) hours as needed for congestion.    [provider]  tamoxifen (NOLVADEX) 20 MG tablet Take 1 tablet (20 mg total) by mouth daily. 10/01/23   Rachel Moulds, MD  triamterene-hydrochlorothiazide (MAXZIDE-25) 37.5-25 MG tablet TAKE 1 TABLET BY MOUTH DAILY 06/25/23   Bedsole, Amy E, MD  valACYclovir (VALTREX) 500 MG tablet Take 1 tablet (500 mg total) by mouth daily. Take daily for suppressive therapy 10/24/22   Excell Seltzer, MD      Allergies    Vibramycin [doxycycline]    Review of Systems   Review of Systems  All other systems reviewed and are negative.   Physical Exam Updated Vital Signs BP 112/67   Pulse 65   Temp 98.6 F (37 C)   Resp 17   Ht 5\' 2"  (1.575 m)   Wt 78 kg   SpO2 100%   BMI 31.46 kg/m  Physical Exam Vitals and nursing note reviewed.  Constitutional:  Appearance: She is well-developed.  HENT:     Head: Atraumatic.  Eyes:     Extraocular Movements: Extraocular movements intact.     Pupils: Pupils are equal, round, and reactive to light.     Comments: No nystagmus appreciated, visual fields are intact  Cardiovascular:     Rate and Rhythm: Normal rate.  Pulmonary:     Effort: Pulmonary effort is normal.  Musculoskeletal:     Cervical back: Normal range of motion and neck supple.  Skin:    General: Skin is warm and dry.  Neurological:     Mental Status: She is alert and oriented to person, place, and time.     Cranial Nerves: No cranial  nerve deficit.     Sensory: No sensory deficit.     Motor: No weakness.     Coordination: Coordination normal.     ED Results / Procedures / Treatments   Labs (all labs ordered are listed, but only abnormal results are displayed) Labs Reviewed  CBC - Abnormal; Notable for the following components:      Result Value   RBC 3.81 (*)    All other components within normal limits  URINALYSIS, ROUTINE W REFLEX MICROSCOPIC - Abnormal; Notable for the following components:   Color, Urine STRAW (*)    Hgb urine dipstick MODERATE (*)    Bacteria, UA RARE (*)    All other components within normal limits  COMPREHENSIVE METABOLIC PANEL - Abnormal; Notable for the following components:   Potassium 3.4 (*)    Glucose, Bld 125 (*)    All other components within normal limits  CBG MONITORING, ED - Abnormal; Notable for the following components:   Glucose-Capillary 109 (*)    All other components within normal limits  CBG MONITORING, ED    EKG EKG Interpretation Date/Time:  Monday November 12 2023 12:05:26 EST Ventricular Rate:  110 PR Interval:  194 QRS Duration:  91 QT Interval:  332 QTC Calculation: 450 R Axis:   -1  Text Interpretation: Sinus tachycardia Abnormal R-wave progression, early transition LVH with secondary repolarization abnormality No acute changes No significant change since last tracing Confirmed by Derwood Kaplan [phone]) on 11/12/2023 4:05:02 PM  Radiology CT HEAD WO CONTRAST Result Date: 11/12/2023 CLINICAL DATA:  Intermittent dizziness for past week. Left arm paresthesias. Hypertension. EXAM: CT HEAD WITHOUT CONTRAST TECHNIQUE: Contiguous axial images were obtained from the base of the skull through the vertex without intravenous contrast. RADIATION DOSE REDUCTION: This exam was performed according to the departmental dose-optimization program which includes automated exposure control, adjustment of the mA and/or kV according to patient size and/or use of iterative  reconstruction technique. COMPARISON:  04/04/2013 FINDINGS: Brain: No evidence of intracranial hemorrhage, acute infarction, hydrocephalus, extra-axial collection, or mass lesion/mass effect. Vascular:  No hyperdense vessel or other acute findings. Skull: No evidence of fracture or other significant bone abnormality. Sinuses/Orbits:  No acute findings. Other: None. IMPRESSION: Negative noncontrast head CT. Electronically Signed   By: Danae Orleans M.D.   On: 11/12/2023 15:12    Procedures Procedures    Medications Ordered in ED Medications - No data to display  ED Course/ Medical Decision Making/ A&P                                 Medical Decision Making Amount and/or Complexity of Data Reviewed Labs: ordered.   This patient presents to the ED with chief complaint(s)  of severe dizziness with episode of hypertension and some recurrent abdominal discomfort.  The dizziness itself is also recurrent, but today the symptoms were more severe and her blood pressure was significantly elevated, prompting the patient to come to the ER.  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    The differential diagnosis includes : Arrhythmia, posterior circulation disturbance, orthostatic dizziness, severe anemia, electrolyte abnormality, hypertensive emergency, TIA.  Prior to my assessment, CT scan of the brain was ordered along with basic blood workup.  The initial plan is to review patient's records from the perspective of fibromuscular dysplasia, especially given that she had what appears to be carotid stenosis and renal artery complication from it.  I will also review patient's blood pressures during her recent encounters and vascular surgery notes.   Additional history obtained: Records reviewed  notes from  primary care and vascular surgery were noted.  Patient was just seen by vascular surgery few days ago.  She was noted to have clean carotids and vertebral artery through the Dopplers.  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  It appears that her BP at PCP and vascular surgery have been stable.  There is specific note from vascular surgery indicating that patient needs to make sure that her BP is closely monitored and managed.  Independent labs interpretation:  The following labs were independently interpreted: CBC, BMP are normal.  Independent visualization and interpretation of imaging: - I independently visualized the following imaging with scope of interpretation limited to determining acute life threatening conditions related to emergency care: CT scan of the brain, which revealed no evidence of brain bleed.  Treatment and Reassessment: Patient reassessed after I reviewed patient's workup today and previous evaluation.  Patient's blood pressure here has normalized without any intervention.  I explained to her that the labile blood pressure could significant eye disease because of her renal artery stenosis or that the BP was elevated because of her fight or flight response from the profound dizziness she had.  I also discussed with her that the dizziness could be because of her blood pressure or a primary issue that she has been struggling with for the last several weeks.  Based on her workup, I do not think she needs CT angiogram head and neck at this time.  She has a normal neurologic exam at this time and has no dizziness, I doubt she had a stroke.  I do not think she had a TIA.  I did express to her that her blood pressure needs to be monitored closely, and more frequently and that she needs to keep a log of it so that if there are any extreme fluctuations in the blood pressure then that can be shared with the PCP.  I  also shared with the patient that the abdominal discomfort is nonspecific and has been going on for several weeks, it is best for her to follow-up with GI doctors as an outpatient to see if she needs additional motility studies.  Patient understanding of the recommendations from the ED.  She agrees that her BP has stabilized on its own and almost normalized.  The patient appears reasonably screened and/or stabilized for discharge and I doubt any other medical condition or other [facility] requiring further screening,  evaluation, or treatment in the ED at this time prior to discharge.   Results from the ER workup discussed with the patient face to face and all questions answered to the best of my ability. The patient is safe for discharge with strict return precautions.    Final Clinical Impression(s) / ED Diagnoses Final diagnoses:  Dizziness of unknown etiology  Dizzy spells  Abdominal discomfort    Rx / DC Orders ED Discharge Orders     None         Derwood Kaplan, MD 11/12/23 1911

## 2023-11-12 NOTE — Telephone Encounter (Signed)
Copied from CRM [id number]. Topic: Clinical - Red Word Triage >> Nov 12, 2023 11:05 AM Denese Killings wrote: Red Word that prompted transfer to Nurse Triage: Patient states that she feels dizzy with shortness of breath and her blood pressure elevating along with stomach issue She has episodes often and is concerned. She states something is off today.  Chief Complaint: dizziness, stomach issues,sob. Symptoms: On Friday, patient had episode of elevated bp and hr where she became dizzy with sob.  Denies these symptoms today.  Frequency: constant on Friday Pertinent Negatives: Patient denies fever, sob at this time. Disposition: [] ED /[] Urgent Care (no appt availability in office) / [x] Appointment(In office/virtual)/ []  [facility]/ [] Home Care/ [] Refused Recommended Disposition /[] [facility]/ []  Follow-up with PCP Additional Notes: patient c/o dizziness and not feeling right today.  States she had episode on Friday that elevated her bp and hr causing dizziness and sob.  Denies these symptoms at this time. Apt. Made for tomorrow with pcp office.  Instructed to go to er if becomes worse.   Reason for Disposition  [1] MODERATE dizziness (e.g., vertigo; feels very unsteady, interferes with normal activities) AND [2] has NOT been evaluated by doctor (or NP/PA) for this  Answer Assessment - Initial Assessment Questions 1. DESCRIPTION: "Describe your dizziness."     When "I bend down the room is spinning" 2. VERTIGO: "Do you feel like either you or the room is spinning or tilting?"      Room spinning 3. LIGHTHEADED: "Do you feel lightheaded?" (e.g., somewhat faint, woozy, weak upon standing)     Yes, all the above 4. SEVERITY: "How bad is it?"  "Can you walk?"   - MILD: Feels slightly dizzy and unsteady, but is walking normally.   - MODERATE: Feels unsteady when walking, but not falling; interferes with normal activities (e.g., school, work).   - SEVERE: Unable to walk without  falling, or requires assistance to walk without falling.     "Not really, no" 5. ONSET:  "When did the dizziness begin?"     The other day. 7. CAUSE: "What do you think is causing the dizziness?"     Heart rate became elevated and I felt woozy.  8. RECURRENT SYMPTOM: "Have you had dizziness before?" If Yes, ask: "When was the last time?" "What happened that time?"     Friday am.  9. OTHER SYMPTOMS: "Do you have any other symptoms?" (e.g., headache, weakness, numbness, vomiting, earache)     no  Protocols used: Dizziness - Vertigo-A-AH

## 2023-11-13 ENCOUNTER — Ambulatory Visit: Payer: Medicare Other | Admitting: Internal Medicine

## 2023-11-23 ENCOUNTER — Encounter: Payer: Self-pay | Admitting: Family Medicine

## 2023-11-23 ENCOUNTER — Ambulatory Visit (INDEPENDENT_AMBULATORY_CARE_PROVIDER_SITE_OTHER): Payer: Medicare Other | Admitting: Family Medicine

## 2023-11-23 VITALS — BP 130/76 | HR 75 | Temp 98.9°F | Ht 62.0 in | Wt 176.5 lb

## 2023-11-23 DIAGNOSIS — R0602 Shortness of breath: Secondary | ICD-10-CM | POA: Diagnosis not present

## 2023-11-23 DIAGNOSIS — I998 Other disorder of circulatory system: Secondary | ICD-10-CM | POA: Diagnosis not present

## 2023-11-23 DIAGNOSIS — I773 Arterial fibromuscular dysplasia: Secondary | ICD-10-CM

## 2023-11-23 DIAGNOSIS — I10 Essential (primary) hypertension: Secondary | ICD-10-CM | POA: Diagnosis not present

## 2023-11-23 DIAGNOSIS — R9431 Abnormal electrocardiogram [ECG] [EKG]: Secondary | ICD-10-CM

## 2023-11-23 NOTE — Progress Notes (Signed)
 Patient ID: Kristy [name], female    DOB: 11/22/1940, 83 y.o.   MRN: [id number]  This visit was conducted in person.  BP 130/76 (BP Location: Left Arm, Patient Position: Sitting, Cuff Size: Normal)   Pulse 75   Temp 98.9 F (37.2 C) (Temporal)   Ht 5' 2 (1.575 m)   Wt 176 lb 8 oz (80.1 kg)   SpO2 96%   BMI 32.28 kg/m    CC:  Chief Complaint  Patient presents with   Follow-up    ER Follow UP-Dizziness/Abd Discomfort    Subjective:   HPI: Kristy [name] is a 83 y.o. female with fibromuscular dysplasia presenting on 11/23/2023 for Follow-up (ER Follow UP-Dizziness/Abd Discomfort)  ED visit on 12/23 for elevated BP and dizziness Had been dizzy fo 1 week.. had upset stoamch... BP was 190-200/ 97  Cbc nml EKG: sinus tachy, LVH, poor R wave progression. CMET K 3.4 UA: moderate blood, no wbc  CT head: negative      Today .Kristy [name] Since then BP much bnetter 120/70.  She reports she has been more SOB with exertion in last months.  No chest pain or palpitations.  Occ dizziness at times LVH was noted on EKG BP Readings from Last 3 Encounters:  11/23/23 130/76  11/12/23 139/78  10/08/23 (!) 145/77     Has appt with GI on 1/9... currently with constipation.. using stool softners. Last BM yesterday.  No abd pain, occ nausea  Relevant past medical, surgical, family and social history reviewed and updated as indicated. Interim medical history since our last visit reviewed. Allergies and medications reviewed and updated. Outpatient Medications Prior to Visit  Medication Sig Dispense Refill   aspirin  EC 81 MG tablet Take 81 mg by mouth daily. Swallow whole.     atorvastatin  (LIPITOR) 20 MG tablet TAKE 1 TABLET BY MOUTH EVERY DAY 90 tablet 3   losartan  (COZAAR ) 50 MG tablet TAKE 1 TABLET BY MOUTH EVERY DAY 90 tablet 3   metoprolol  succinate (TOPROL -XL) 100 MG 24 hr tablet TAKE 1 TABLET BY MOUTH DAILY  WITH OR IMMEDIATELY FOLLOWING A  MEAL 100 tablet 2   Multiple Vitamin  (MULTIVITAMIN WITH MINERALS) TABS tablet Take 2 tablets by mouth daily.     sodium chloride  (OCEAN) 0.65 % SOLN nasal spray Place 1 spray into both nostrils every 4 (four) hours as needed for congestion.     tamoxifen  (NOLVADEX ) 20 MG tablet Take 1 tablet (20 mg total) by mouth daily. 90 tablet 3   triamterene -hydrochlorothiazide (MAXZIDE-25) 37.5-25 MG tablet TAKE 1 TABLET BY MOUTH DAILY 100 tablet 2   alendronate  (FOSAMAX ) 70 MG tablet Take 1 tablet (70 mg total) by mouth every 7 (seven) days. Take with a full glass of water on an empty stomach. (Patient not taking: Reported on 11/23/2023) 4 tablet 11   valACYclovir  (VALTREX ) 500 MG tablet Take 1 tablet (500 mg total) by mouth daily. Take daily for suppressive therapy 30 tablet 11   No facility-administered medications prior to visit.     Per HPI unless specifically indicated in ROS section below Review of Systems  Constitutional:  Negative for fatigue and fever.  HENT:  Negative for congestion.   Eyes:  Negative for pain.  Respiratory:  Negative for cough and shortness of breath.   Cardiovascular:  Negative for chest pain, palpitations and leg swelling.  Gastrointestinal:  Negative for abdominal pain.  Genitourinary:  Negative for dysuria and vaginal bleeding.  Musculoskeletal:  Negative for back  pain.  Neurological:  Positive for dizziness. Negative for syncope, light-headedness and headaches.  Psychiatric/Behavioral:  Negative for dysphoric mood. The patient is nervous/anxious.    Objective:  BP 130/76 (BP Location: Left Arm, Patient Position: Sitting, Cuff Size: Normal)   Pulse 75   Temp 98.9 F (37.2 C) (Temporal)   Ht 5' 2 (1.575 m)   Wt 176 lb 8 oz (80.1 kg)   SpO2 96%   BMI 32.28 kg/m   Wt Readings from Last 3 Encounters:  11/23/23 176 lb 8 oz (80.1 kg)  11/12/23 172 lb (78 kg)  10/08/23 176 lb 3.2 oz (79.9 kg)      Physical Exam Constitutional:      General: She is not in acute distress.    Appearance: Normal  appearance. She is well-developed. She is not ill-appearing or toxic-appearing.  HENT:     Head: Normocephalic.     Right Ear: Hearing, tympanic membrane, ear canal and external ear normal. Tympanic membrane is not erythematous, retracted or bulging.     Left Ear: Hearing, tympanic membrane, ear canal and external ear normal. Tympanic membrane is not erythematous, retracted or bulging.     Nose: No mucosal edema or rhinorrhea.     Right Sinus: No maxillary sinus tenderness or frontal sinus tenderness.     Left Sinus: No maxillary sinus tenderness or frontal sinus tenderness.     Mouth/Throat:     Pharynx: Uvula midline.  Eyes:     General: Lids are normal. Lids are everted, no foreign bodies appreciated.     Conjunctiva/sclera: Conjunctivae normal.     Pupils: Pupils are equal, round, and reactive to light.  Neck:     Thyroid : No thyroid  mass or thyromegaly.     Vascular: No carotid bruit.     Trachea: Trachea normal.  Cardiovascular:     Rate and Rhythm: Normal rate and regular rhythm.     Pulses: Normal pulses.     Heart sounds: Normal heart sounds, S1 normal and S2 normal. No murmur heard.    No friction rub. No gallop.  Pulmonary:     Effort: Pulmonary effort is normal. No tachypnea or respiratory distress.     Breath sounds: Normal breath sounds. No decreased breath sounds, wheezing, rhonchi or rales.  Abdominal:     General: Bowel sounds are normal.     Palpations: Abdomen is soft.     Tenderness: There is no abdominal tenderness.  Musculoskeletal:     Cervical back: Normal range of motion and neck supple.  Skin:    General: Skin is warm and dry.     Findings: No rash.  Neurological:     Mental Status: She is alert.  Psychiatric:        Mood and Affect: Mood is not anxious or depressed.        Speech: Speech normal.        Behavior: Behavior normal. Behavior is cooperative.        Thought Content: Thought content normal.        Judgment: Judgment normal.        Results for orders placed or performed during the hospital encounter of 11/12/23  CBG monitoring, ED   Collection Time: 11/12/23 12:05 PM  Result Value Ref Range   Glucose-Capillary 109 (H) 70 - 99 mg/dL  CBC   Collection Time: 11/12/23 12:43 PM  Result Value Ref Range   WBC 6.2 4.0 - 10.5 K/uL   RBC 3.81 (L) 3.87 -  5.11 MIL/uL   Hemoglobin 12.7 12.0 - 15.0 g/dL   HCT 63.1 63.9 - 53.9 %   MCV 96.6 80.0 - 100.0 fL   MCH 33.3 26.0 - 34.0 pg   MCHC 34.5 30.0 - 36.0 g/dL   RDW 87.1 88.4 - 84.4 %   Platelets 280 150 - 400 K/uL   nRBC 0.0 0.0 - 0.2 %  Urinalysis, Routine w reflex microscopic -Urine, Clean Catch   Collection Time: 11/12/23 12:43 PM  Result Value Ref Range   Color, Urine STRAW (A) YELLOW   APPearance CLEAR CLEAR   Specific Gravity, Urine 1.006 1.005 - 1.030   pH 7.0 5.0 - 8.0   Glucose, UA NEGATIVE NEGATIVE mg/dL   Hgb urine dipstick MODERATE (A) NEGATIVE   Bilirubin Urine NEGATIVE NEGATIVE   Ketones, ur NEGATIVE NEGATIVE mg/dL   Protein, ur NEGATIVE NEGATIVE mg/dL   Nitrite NEGATIVE NEGATIVE   Leukocytes,Ua NEGATIVE NEGATIVE   RBC / HPF 6-10 0 - 5 RBC/hpf   WBC, UA 0-5 0 - 5 WBC/hpf   Bacteria, UA RARE (A) NONE SEEN   Squamous Epithelial / HPF 6-10 0 - 5 /HPF   Mucus PRESENT   Comprehensive metabolic panel   Collection Time: 11/12/23 12:43 PM  Result Value Ref Range   Sodium 137 135 - 145 mmol/L   Potassium 3.4 (L) 3.5 - 5.1 mmol/L   Chloride 103 98 - 111 mmol/L   CO2 25 22 - 32 mmol/L   Glucose, Bld 125 (H) 70 - 99 mg/dL   BUN 14 8 - 23 mg/dL   Creatinine, Ser 9.16 0.44 - 1.00 mg/dL   Calcium  9.7 8.9 - 10.3 mg/dL   Total Protein 7.2 6.5 - 8.1 g/dL   Albumin 4.1 3.5 - 5.0 g/dL   AST 29 15 - 41 U/L   ALT 28 0 - 44 U/L   Alkaline Phosphatase 42 38 - 126 U/L   Total Bilirubin 0.5 <1.2 mg/dL   GFR, Estimated >39 >39 mL/min   Anion gap 9 5 - 15    Assessment and Plan  Abnormal EKG -     Ambulatory referral to Cardiology -     ECHOCARDIOGRAM  COMPLETE; Future  SOB (shortness of breath) -     Ambulatory referral to Cardiology -     ECHOCARDIOGRAM COMPLETE; Future  Fibromuscular dysplasia (HCC) Assessment & Plan: Chronic, followed by vascular.  Orders: -     Ambulatory referral to Cardiology  Blood pressure instability -     Ambulatory referral to Cardiology  HYPERTENSION, BENIGN ESSENTIAL, LABILE Assessment & Plan: Chronic, continued fluctuations in blood pressure despite current regimen. EKG shows concern for possible LVH.  Will evaluate with echocardiogram.  Referral placed to cardiology for further evaluation and help with management of fluctuating blood pressure.  Orders: -     Ambulatory referral to Cardiology    No follow-ups on file.   Greig Ring, MD

## 2023-11-23 NOTE — Patient Instructions (Addendum)
 We will move forward with US of the heart and referral to Cardiology.  Keep appt for follow up with GI.  Follow BP at home, continue current medications.

## 2023-11-26 NOTE — Assessment & Plan Note (Signed)
Chronic, followed by vascular.

## 2023-11-26 NOTE — Assessment & Plan Note (Signed)
 Chronic, continued fluctuations in blood pressure despite current regimen. EKG shows concern for possible LVH.  Will evaluate with echocardiogram.  Referral placed to cardiology for further evaluation and help with management of fluctuating blood pressure

## 2023-11-29 ENCOUNTER — Encounter: Payer: Self-pay | Admitting: Gastroenterology

## 2023-11-29 ENCOUNTER — Ambulatory Visit: Payer: Medicare Other | Admitting: Gastroenterology

## 2023-11-29 VITALS — BP 124/70 | HR 83 | Ht 62.0 in | Wt 176.0 lb

## 2023-11-29 DIAGNOSIS — Z860100 Personal history of colon polyps, unspecified: Secondary | ICD-10-CM | POA: Diagnosis not present

## 2023-11-29 DIAGNOSIS — K219 Gastro-esophageal reflux disease without esophagitis: Secondary | ICD-10-CM | POA: Diagnosis not present

## 2023-11-29 DIAGNOSIS — R194 Change in bowel habit: Secondary | ICD-10-CM

## 2023-11-29 NOTE — Patient Instructions (Signed)
 It has been recommended to you by your physician that you have a(n) endoscopy/colonoscopy completed. Per your request, we did not schedule the procedure(s) today. Please contact our office at [phone] should you decide to have the procedure completed. You will be scheduled for a pre-visit and procedure at that time.  _______________________________________________________  If your blood pressure at your visit was 140/90 or greater, please contact your primary care physician to follow up on this.  _______________________________________________________  If you are age 81 or older, your body mass index should be between 23-30. Your Body mass index is 32.19 kg/m. If this is out of the aforementioned range listed, please consider follow up with your Primary Care Provider.  If you are age 44 or younger, your body mass index should be between 19-25. Your Body mass index is 32.19 kg/m. If this is out of the aformentioned range listed, please consider follow up with your Primary Care Provider.   ________________________________________________________  The [facility] providers would like to encourage you to use MYCHART to communicate with providers for non-urgent requests or questions.  Due to long hold times on the telephone, sending your provider a message by [facility] may be a faster and more efficient way to get a response.  Please allow 48 business hours for a response.  Please remember that this is for non-urgent requests.  _______________________________________________________

## 2023-11-29 NOTE — Progress Notes (Signed)
 11/29/2023 Kristy [name] [id number] 1940/12/24   HISTORY OF PRESENT ILLNESS: This is an 83 year old female who is a patient of Dr. Albin.  She is known to him previously only for colonoscopy.  Last one in June 2020 as below.  Had several polyps removed that were tubular adenomas so repeat was recommended at 2 years.  Unfortunately in 2022 she also required cholecystectomy and had breast cancer so had to deal with those issues.  She is here now to schedule a colonoscopy.  She says that since her gallbladder surgery she has had some change in her bowel habits.  A lot of times she has loose stool, often with urgency.  Is afraid to go out to eat, etc.  Also has a lot of issues with acid reflux.  Is taking a lot of Tums.  Says that she was on Protonix  remotely in the past, but has been off of that for quite some time.  No previous EGD.  Colonoscopy 04/2019:  - One 10 mm polyp in the transverse colon, removed with a hot snare. Resected and retrieved. - Nine 6 to 8 mm polyps in the sigmoid colon, in the transverse colon and in the ascending colon, removed with a cold snare. Resected and retrieved. - Mild diverticulosis in the left colon. - Internal hemorrhoids. - The examination was otherwise normal on direct and retroflexion views.  1. Surgical [P], colon, transverse and ascending, polyp (5) - TUBULAR ADENOMA(S) WITHOUT HIGH-GRADE DYSPLASIA OR MALIGNANCY 2. Surgical [P], colon, transverse, polyp - TUBULAR ADENOMA(S) WITHOUT HIGH-GRADE DYSPLASIA OR MALIGNANCY 3. 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   Constipation    uses stool softener PRN- uses once a week to once every 2 weeks    Fibromuscular dysplasia (HCC)    Gallbladder disease    Hyperlipidemia    Hypertension    For 16 years; urinary catecholamines within normal limits 12/09; renal Doppler ultrasound showed no evidence for renal artery stenosis   Normal echocardiogram 02/2009   LVEF 65%; no regional wall motion abnormalities; normal RV size and function; pulmonic valve had increased gradient across w/ peak gradient of about 36 mmHg (range of moderate pulmonic stenosis) followup showed normal valve   Papilloma of breast    right   Personal history of radiation therapy    2022   Tinnitus of left ear    Tubular adenoma of colon 10/2013   Past Surgical History:  Procedure Laterality Date   ANGIOPLASTY     2012   BREAST BIOPSY Right 2006   benign   BREAST BIOPSY Right 11/09/2020   BREAST BIOPSY Right 11/23/2020   BREAST BIOPSY Right 11/29/2020   x2   BREAST EXCISIONAL BIOPSY Right 07/2017   benign   BREAST LUMPECTOMY Right 12/16/2020   BREAST LUMPECTOMY WITH RADIOACTIVE SEED LOCALIZATION Right 08/14/2017   Procedure: RIGHT BREAST LUMPECTOMY WITH RADIOACTIVE SEED LOCALIZATION;  Surgeon: Vanderbilt Ned, MD;  Location: [facility];  Service: General;  Laterality: Right;   BREAST LUMPECTOMY WITH RADIOACTIVE SEED LOCALIZATION Right 12/16/2020   Procedure: RADIOACTIVE SEED GUIDED TIMES 3 RIGHT BREAST LUMPECTOMY;  Surgeon: Vanderbilt Ned, MD;  Location: [facility];  Service: General;  Laterality: Right;   Cardiolyte  09/2006   Neg   CHOLECYSTECTOMY N/A 09/22/2021   Procedure: LAPAROSCOPIC CHOLECYSTECTOMY WITH  INTRAOPERATIVE CHOLANGIOGRAM;  Surgeon: Vanderbilt Ned, MD;  Location: [facility];  Service: General;  Laterality: N/A;   COLONOSCOPY     KNEE CARTILAGE SURGERY     right knee   POLYPECTOMY      RE-EXCISION OF BREAST LUMPECTOMY Right 01/11/2021   Procedure: RE-EXCISION OF RIGHT BREAST LUMPECTOMY;  Surgeon: Vanderbilt Ned, MD;  Location: [facility];  Service: General;  Laterality: Right;   TOTAL ABDOMINAL HYSTERECTOMY     no cervix   UPPER GASTROINTESTINAL ENDOSCOPY      reports that she quit smoking about 41 years ago. Her smoking use included cigarettes. She started smoking about 56 years ago. She has a 15 pack-year smoking history. She has never used smokeless tobacco. She reports that she does not drink alcohol  and does not use drugs. family history includes Colon cancer in her maternal uncle and paternal uncle; Colon polyps in her sister; Coronary artery disease in her paternal grandmother; Goiter in an other family member; Lung cancer in her father; Prostate cancer in her father. Allergies  Allergen Reactions   Vibramycin  [Doxycycline ]     Many years ago - made me feel funny      Outpatient Encounter Medications as of 11/29/2023  Medication Sig   aspirin  EC 81 MG tablet Take 81 mg by mouth daily. Swallow whole.   atorvastatin  (LIPITOR) 20 MG tablet TAKE 1 TABLET BY MOUTH EVERY DAY   losartan  (COZAAR ) 50 MG tablet TAKE 1 TABLET BY MOUTH EVERY DAY   metoprolol  succinate (TOPROL -XL) 100 MG 24 hr tablet TAKE 1 TABLET BY MOUTH DAILY  WITH OR IMMEDIATELY FOLLOWING A  MEAL   Multiple Vitamin (MULTIVITAMIN WITH MINERALS) TABS tablet Take 2 tablets by mouth daily.   sodium chloride  (OCEAN) 0.65 % SOLN nasal spray Place 1 spray into both nostrils every 4 (four) hours as needed for congestion.   tamoxifen  (NOLVADEX ) 20 MG tablet Take 1 tablet (20 mg total) by mouth daily.   triamterene -hydrochlorothiazide (MAXZIDE-25) 37.5-25 MG tablet TAKE 1 TABLET BY MOUTH DAILY   alendronate  (FOSAMAX ) 70 MG tablet Take 1 tablet (70 mg total) by mouth every 7 (seven) days. Take with a full glass of water on an empty stomach. (Patient not taking: Reported on 11/23/2023)   No  facility-administered encounter medications on file as of 11/29/2023.    REVIEW OF SYSTEMS  : All other systems reviewed and negative except where noted in the History of Present Illness.   PHYSICAL EXAM: BP 124/70   Pulse 83   Ht 5' 2 (1.575 m)   Wt 176 lb (79.8 kg)   BMI 32.19 kg/m  General: Well developed female in no acute distress Head: Normocephalic and atraumatic Eyes:  Sclerae anicteric,conjunctive pink. Ears: Normal auditory acuity Lungs: Clear throughout to auscultation; no W/R/R. Heart: Regular rate and rhythm; no M/R/G. Abdomen: Soft, non-distended.  BS present.  Non-tender. Rectal:  Will be done at the time of colonoscopy. Musculoskeletal: Symmetrical with no gross deformities  Skin: No lesions on visible extremities Neurological: Alert oriented x 4, grossly non-focal Psychological:  Alert and cooperative. Normal mood and affect  ASSESSMENT AND PLAN: *GERD:  Has had issues with acid reflux for many years.  Took pantoprazole  for a while.  Not on anything currently but having a lot of symptoms.  Using Tums, etc.  Will evaluate with EGD with Dr. Federico.  She would like to hold off on starting any other regular medication until this is performed. *Personal history of colon polyps: Had several polyps removed on colonoscopy in 2020, was due in 2022 but had gallbladder issues, breast cancer, etc.  Will schedule for colonoscopy as well. *Change in bowel habits: Patient describes having issues with looser stool and urgency, more so since having her gallbladder removed in 2022.  Could be bile salt related.  Obviously will evaluate with colonoscopy to rule out other issues including microscopic colitis, etc.  Can use Imodium as needed for now.  Could consider Questran  if no other findings on colonoscopy.  **Of note, she has been having issues with some dizziness.  Apparently she is reaching out to cardiology today to schedule stress test.  Once she has a stress test in place then we  can plan to schedule her procedures with Dr. Federico.   CC:  Bedsole, Amy E, MD

## 2023-11-30 ENCOUNTER — Inpatient Hospital Stay: Payer: Medicare Other | Attending: Hematology and Oncology | Admitting: Hematology and Oncology

## 2023-11-30 VITALS — BP 139/77 | HR 101 | Temp 97.2°F | Resp 16 | Wt 174.8 lb

## 2023-11-30 DIAGNOSIS — Z923 Personal history of irradiation: Secondary | ICD-10-CM | POA: Diagnosis not present

## 2023-11-30 DIAGNOSIS — Z87891 Personal history of nicotine dependence: Secondary | ICD-10-CM | POA: Insufficient documentation

## 2023-11-30 DIAGNOSIS — D0511 Intraductal carcinoma in situ of right breast: Secondary | ICD-10-CM

## 2023-11-30 DIAGNOSIS — Z79810 Long term (current) use of selective estrogen receptor modulators (SERMs): Secondary | ICD-10-CM | POA: Insufficient documentation

## 2023-11-30 NOTE — Progress Notes (Signed)
 I agree with the assessment and plan as outlined by Ms. Cristi Loron.

## 2023-11-30 NOTE — Progress Notes (Signed)
 [facility]  Telephone:([phone]) [phone] Fax:(336) [phone]    ID: Kristy [name] DOB: 11-Oct-1941  MR#: [id number]  RDW#:[id number]  Patient Care Team: Avelina Greig BRAVO, MD as PCP - General Vanderbilt Ned, MD as Consulting Physician (General Surgery) Dewey Rush, MD as Consulting Physician (Radiation Oncology) Aneita Gwendlyn ONEIDA, MD (Inactive) as Consulting Physician (Gastroenterology) Glean Stephane BROCKS, RN as Oncology Nurse Navigator Tyree Nanetta SAILOR, RN as Oncology Nurse Navigator Fate Morna SAILOR, [facility] (Inactive) as Pharmacist (Pharmacist) Loretha Ash, MD as Consulting Physician (Hematology and Oncology) Ash Loretha, MD  CHIEF COMPLAINT: noninvasive breast cancer, estrogen receptor positive  CURRENT TREATMENT: Tamoxifen   INTERVAL HISTORY:  Kristy [name] returns today for follow up of her noninvasive breast cancer.  She started tamoxifen  on 03/22/2021.  She is tolerating tamoxifen  well. She has an ECHo coming up since she had an episode of very high blood pressures. She is also scheduled for endoscopy and colonoscopy to evaluate some ongoing abdominal complaints. Besides this she is doing well, keeping active and has no complaints. She had a quiet Christmas.   COVID 19 VACCINATION STATUS: fully vaccinated [employer]), with booster 08/2020   HISTORY OF CURRENT ILLNESS: From the original intake note:  Kristy [name] has a history of right breast lumpectomy on 08/14/2017 under Dr. Vanderbilt for a tubular adenoma with calcifications.  More recently, she had routine screening mammography on 10/11/2020 showing a possible abnormality in the right breast. She underwent right diagnostic mammography with tomography at [facility] on 10/29/2020 showing: breast density category C; indeterminate 1.1 cm group of fine pleomorphic calcifications in lower-outer right breast.  Accordingly on 11/09/2020 she proceeded to biopsy of the right breast area in question. The pathology from  this procedure ([id number]) showed: ductal carcinoma in situ, high grade, with necrosis and calcifications; complex sclerosing lesion. Prognostic indicators significant for: estrogen receptor, 95% positive with strong staining intensity and progesterone receptor, 0% negative.   She underwent breast MRI on 11/15/2020 showing: breast composition C; enhancement around a small hematoma and biopsy tract in lower-outer right breast consistent with patient's known malignancy; indeterminate 6 mm enhancing mass with suspicious kinetics, just superior and posterior to biopsy site in lower-outer right breast; indeterminate 1.8 cm area of linear non-mass enhancement in superior central right breast; no evidence of malignancy in left breast or bilateral lymph nodes.  She proceeded to additional lower-outer right breast biopsy on 11/23/2020. Pathology [id number]) again showed ductal carcinoma in situ, high grade, with necrosis and calcifications. Prognostic panel showing: estrogen receptor 100% positive with strong staining intensity; progesterone receptor 0% negative.  Two additional right breast biopsies were obtained on 11/29/2020. Pathology [id number]) showed: 1. Right Breast, upper central  - ductal carcinoma in situ with necrosis and calcifications 2. 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 Vanderbilt. Pathology from the procedure ([id number]) showed: multifocal intermediate to high grade ductal carcinoma in situ; carcinoma present at cauterized inferior margin; complex sclerosing lesion.   Cancer Staging  Ductal carcinoma in situ (DCIS) of right breast Staging form: Breast, AJCC 7th Edition - Pathologic: Stage 0 (Tis (DCIS), N0, cM0) - Signed by Layla Sandria BROCKS, MD on 12/27/2020 Histologic  grade (G): G3 Estrogen receptor status: Positive Progesterone receptor status: Negative   The patient's subsequent history is as detailed below.    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   Constipation    uses stool softener PRN- uses once a week to once every 2 weeks    Fibromuscular dysplasia (HCC)    Gallbladder disease    Hyperlipidemia    Hypertension    For 16 years; urinary catecholamines within normal limits 12/09; renal Doppler ultrasound showed no evidence for renal artery stenosis   Normal echocardiogram 02/2009   LVEF 65%; no regional wall motion abnormalities; normal RV size and function; pulmonic valve had increased gradient across w/ peak gradient of about 36 mmHg (range of moderate pulmonic stenosis) followup showed normal valve   Papilloma of breast    right   Personal history of radiation therapy    2022   Tinnitus of left ear    Tubular adenoma of colon 10/2013    PAST SURGICAL HISTORY: Past Surgical History:  Procedure Laterality Date   ANGIOPLASTY     2012   BREAST BIOPSY Right 2006   benign   BREAST BIOPSY Right 11/09/2020   BREAST BIOPSY Right 11/23/2020   BREAST BIOPSY Right 11/29/2020   x2   BREAST EXCISIONAL BIOPSY Right 07/2017   benign   BREAST LUMPECTOMY Right 12/16/2020   BREAST LUMPECTOMY WITH RADIOACTIVE SEED LOCALIZATION Right 08/14/2017   Procedure: RIGHT BREAST LUMPECTOMY WITH RADIOACTIVE SEED LOCALIZATION;  Surgeon: Vanderbilt Ned, MD;  Location: [facility];  Service: General;  Laterality: Right;   BREAST LUMPECTOMY WITH RADIOACTIVE SEED  LOCALIZATION Right 12/16/2020   Procedure: RADIOACTIVE SEED GUIDED TIMES 3 RIGHT BREAST LUMPECTOMY;  Surgeon: Vanderbilt Ned, MD;  Location: [facility];  Service: General;  Laterality: Right;   Cardiolyte  09/2006   Neg   CHOLECYSTECTOMY N/A 09/22/2021   Procedure: LAPAROSCOPIC CHOLECYSTECTOMY WITH  INTRAOPERATIVE CHOLANGIOGRAM;  Surgeon: Vanderbilt Ned, MD;  Location: [facility];  Service: General;  Laterality: N/A;   COLONOSCOPY     KNEE CARTILAGE SURGERY     right knee   POLYPECTOMY     RE-EXCISION OF BREAST LUMPECTOMY Right 01/11/2021   Procedure: RE-EXCISION OF RIGHT BREAST LUMPECTOMY;  Surgeon: Vanderbilt Ned, MD;  Location: [facility];  Service: General;  Laterality: Right;   TOTAL ABDOMINAL HYSTERECTOMY     no cervix   UPPER GASTROINTESTINAL ENDOSCOPY      FAMILY HISTORY: Family History  Problem Relation Age of Onset   Prostate cancer Father    Lung cancer Father    Colon polyps Sister    Colon cancer Maternal Uncle    Colon cancer Paternal Uncle    Coronary artery disease Paternal Grandmother    Goiter Other        ?   Esophageal cancer Neg Hx    Stomach cancer Neg Hx    Rectal cancer Neg Hx    Bladder Cancer Neg Hx    Kidney cancer Neg Hx    BRCA 1/2 Neg Hx    Breast cancer Neg Hx   The patient's father died at the age of 45 from lung cancer.  The patient's mother died from  amyotrophic lateral sclerosis at age 1.  The patient has 1 brother and 1 sister.  There is no other family history of cancer to her knowledge   GYNECOLOGIC HISTORY:  No LMP recorded. Patient has had a hysterectomy. Menarche: 83 years old GX P 0 LMP at the time of hysterectomy in 1993, for fibroids contraceptive no HRT yes, about 4 years  Hysterectomy? yes BSO?  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  Her husband died from colon cancer  at age 70.  At [facility] she lives with Derrill Gander, her significant other, who is a retired naval architect.   ADVANCED DIRECTIVES: Not in place; the patient is considering naming her niece Darice Search, a retired engineer, civil (consulting), as her healthcare power of attorney   HEALTH MAINTENANCE: Social History   Tobacco Use   Smoking status: Former    Current packs/day: 0.00    Average packs/day: 1 pack/day for 15.0 years (15.0 ttl pk-yrs)    Types: Cigarettes    Start date: 11/21/1967    Quit date: 11/20/1982    Years since quitting: 41.0   Smokeless tobacco: Never   Tobacco comments:    15 pack year history  Vaping Use   Vaping status: Never Used  Substance Use Topics   Alcohol  use: No   Drug use: No     Colonoscopy: 04/2019 (Dr. Aneita), recall 2022  PAP: Status post hysterectomy  Bone density: 08/2019, -2.0   Allergies  Allergen Reactions   Vibramycin  [Doxycycline ]     Many years ago - made me feel funny    Current Outpatient Medications  Medication Sig Dispense Refill   alendronate  (FOSAMAX ) 70 MG tablet Take 1 tablet (70 mg total) by mouth every 7 (seven) days. Take with a full glass of water on an empty stomach. (Patient not taking: Reported on 11/23/2023) 4 tablet 11   aspirin  EC 81 MG tablet Take 81 mg by mouth daily. Swallow whole.     atorvastatin  (LIPITOR) 20 MG tablet TAKE 1 TABLET BY MOUTH EVERY DAY 90 tablet 3   losartan  (COZAAR ) 50 MG tablet TAKE 1 TABLET BY MOUTH EVERY DAY 90 tablet 3   metoprolol  succinate (TOPROL -XL) 100 MG 24 hr tablet TAKE 1 TABLET BY MOUTH DAILY  WITH OR IMMEDIATELY FOLLOWING A  MEAL 100 tablet 2   Multiple Vitamin (MULTIVITAMIN WITH MINERALS) TABS tablet Take 2 tablets by mouth daily.     sodium chloride  (OCEAN) 0.65 % SOLN nasal spray Place 1 spray into both nostrils every 4 (four) hours as needed for congestion.     tamoxifen  (NOLVADEX ) 20 MG tablet Take 1 tablet (20 mg total) by mouth daily. 90 tablet 3   triamterene -hydrochlorothiazide (MAXZIDE-25)  37.5-25 MG tablet TAKE 1 TABLET BY MOUTH DAILY 100 tablet 2   No current facility-administered medications for this visit.    OBJECTIVE: African-American woman who appears younger than stated age  Vitals:   11/30/23 1208  BP: 139/77  Pulse: (!) 101  Resp: 16  Temp: (!) 97.2 F (36.2 C)  SpO2: 100%      Body mass index is 31.97 kg/m.   Wt Readings from Last 3 Encounters:  11/30/23 174 lb 12.8 oz (79.3 kg)  11/29/23 176 lb (79.8 kg)  11/23/23 176 lb 8 oz (80.1 kg)     ECOG FS:1 - Symptomatic but completely ambulatory  Sclerae unicteric, EOMs intact  No palpable masses.  Architectural distortion from prior surgery noted. No regional adenopathy  LAB RESULTS:  CMP     Component Value Date/Time   NA 137 11/12/2023 1243   K 3.4 (L) 11/12/2023 1243   CL 103 11/12/2023 1243   CO2 25 11/12/2023 1243   GLUCOSE 125 (H) 11/12/2023 1243   BUN 14 11/12/2023 1243   CREATININE 0.83 11/12/2023 1243   CALCIUM  9.7 11/12/2023 1243   PROT 7.2 11/12/2023 1243   ALBUMIN 4.1 11/12/2023 1243   AST 29 11/12/2023 1243   ALT 28 11/12/2023 1243   ALKPHOS 42 11/12/2023 1243   BILITOT 0.5 11/12/2023 1243   GFRNONAA >60 11/12/2023 1243   GFRAA >60 08/09/2017 1300    No results found for: TOTALPROTELP, ALBUMINELP, A1GS, A2GS, BETS, BETA2SER, GAMS, MSPIKE, SPEI  Lab Results  Component Value Date   WBC 6.2 11/12/2023   NEUTROABS 3.1 08/29/2023   HGB 12.7 11/12/2023   HCT 36.8 11/12/2023   MCV 96.6 11/12/2023   PLT 280 11/12/2023    No results found for: LABCA2  No components found for: OJARJW874  No results for input(s): INR in the last 168 hours.  No results found for: LABCA2  No results found for: RJW800  No results found for: CAN125  No results found for: CAN153  No results found for: CA2729  No components found for: HGQUANT  No results found for: CEA1, CEA / No results found for: CEA1, CEA   No results found for:  AFPTUMOR  No results found for: CHROMOGRNA  No results found for: KPAFRELGTCHN, LAMBDASER, KAPLAMBRATIO (kappa/lambda light chains)  No results found for: HGBA, HGBA2QUANT, HGBFQUANT, HGBSQUAN (Hemoglobinopathy evaluation)   No results found for: LDH  No results found for: IRON, TIBC, IRONPCTSAT (Iron and TIBC)  No results found for: FERRITIN  Urinalysis    Component Value Date/Time   COLORURINE STRAW (A) 11/12/2023 1243   APPEARANCEUR CLEAR 11/12/2023 1243   APPEARANCEUR Clear 08/05/2019 1555   LABSPEC 1.006 11/12/2023 1243   PHURINE 7.0 11/12/2023 1243   GLUCOSEU NEGATIVE 11/12/2023 1243   HGBUR MODERATE (A) 11/12/2023 1243   HGBUR large 11/03/2010 1548   BILIRUBINUR NEGATIVE 11/12/2023 1243   BILIRUBINUR Negative 05/22/2022 1205   BILIRUBINUR Negative 08/05/2019 1555   KETONESUR NEGATIVE 11/12/2023 1243   PROTEINUR NEGATIVE 11/12/2023 1243   UROBILINOGEN 0.2 05/22/2022 1205   UROBILINOGEN 0.2 06/10/2011 1040   NITRITE NEGATIVE 11/12/2023 1243   LEUKOCYTESUR NEGATIVE 11/12/2023 1243    STUDIES: CT HEAD WO CONTRAST Result Date: 11/12/2023 CLINICAL DATA:  Intermittent dizziness for past week. Left arm paresthesias. Hypertension. EXAM: CT HEAD WITHOUT CONTRAST TECHNIQUE: Contiguous axial images were obtained from the base of the skull through the vertex without intravenous contrast. RADIATION DOSE REDUCTION: This exam was performed according to the departmental dose-optimization program which includes automated exposure control, adjustment of the mA and/or kV according to patient size and/or use of iterative reconstruction technique. COMPARISON:  04/04/2013 FINDINGS: Brain: No evidence of intracranial hemorrhage, acute infarction, hydrocephalus, extra-axial collection, or mass lesion/mass effect. Vascular:  No hyperdense vessel or other acute findings. Skull: No evidence of fracture or other significant bone abnormality. Sinuses/Orbits:  No acute  findings. Other: None. IMPRESSION: Negative noncontrast head CT. Electronically Signed   By: Norleen DELENA Kil M.D.   On: 11/12/2023 15:12   MM 3D SCREENING MAMMOGRAM BILATERAL BREAST Result Date: 11/05/2023 CLINICAL DATA:  Screening. EXAM: DIGITAL SCREENING BILATERAL MAMMOGRAM WITH TOMOSYNTHESIS AND CAD TECHNIQUE: Bilateral screening digital craniocaudal and mediolateral oblique  mammograms were obtained. Bilateral screening digital breast tomosynthesis was performed. The images were evaluated with computer-aided detection. COMPARISON:  Previous exam(s). ACR Breast Density Category b: There are scattered areas of fibroglandular density. FINDINGS: There are no findings suspicious for malignancy. IMPRESSION: No mammographic evidence of malignancy. A result letter of this screening mammogram will be mailed directly to the patient. RECOMMENDATION: Screening mammogram in one year. (Code:SM-B-01Y) BI-RADS CATEGORY  1: Negative. Electronically Signed   By: Dobrinka  Dimitrova M.D.   On: 11/05/2023 14:23     ELIGIBLE FOR AVAILABLE RESEARCH PROTOCOL:   ASSESSMENT: 83 y.o.  woman status post right lumpectomy 12/16/2020, for a multi centric ductal carcinoma in situ, grade 3, with a positive inferior lateral margin  (a) consider additional surgery for margin clearance  (1) adjuvant radiation 02/16/2021 through 03/15/2021 Site Technique Total Dose (Gy) Dose per Fx (Gy) Completed Fx Beam Energies  Breast, Right: Breast_Rt 3D 42.56/42.56 2.66 16/16 6X  Breast, Right: Breast_Rt_Bst 3D 8/8 2 4/4 6X   (2) tamoxifen  started 03/22/2021  (a) status post hysterectomy  (b) bone density 09/15/2019 shows a T score of -2.0   PLAN:  Patient is doing remarkably well on tamoxifen . No concerns for recurrence on ROS or PE. Mammogram Dec 2024, neg for malignancy. RTC in one yr for follow up Proceed with planned ECHo, endoscopy and colonoscopy Encouraged regular exercise, Vit D/calcium  supplementation.  Total  encounter time 30 minutes.* *Total Encounter Time as defined by the Centers for Medicare and Medicaid Services includes, in addition to the face-to-face time of a patient visit (documented in the note above) non-face-to-face time: obtaining and reviewing outside history, ordering and reviewing medications, tests or procedures, care coordination (communications with other health care professionals or caregivers) and documentation in the medical record.

## 2023-12-06 ENCOUNTER — Ambulatory Visit (HOSPITAL_COMMUNITY): Payer: Medicare Other | Attending: Family Medicine

## 2023-12-06 DIAGNOSIS — R9431 Abnormal electrocardiogram [ECG] [EKG]: Secondary | ICD-10-CM | POA: Insufficient documentation

## 2023-12-06 DIAGNOSIS — R0602 Shortness of breath: Secondary | ICD-10-CM | POA: Insufficient documentation

## 2023-12-06 LAB — ECHOCARDIOGRAM COMPLETE
Area-P 1/2: 3.08 cm2
S' Lateral: 2.4 cm

## 2023-12-08 ENCOUNTER — Encounter: Payer: Self-pay | Admitting: Family Medicine

## 2023-12-10 ENCOUNTER — Telehealth: Payer: Self-pay | Admitting: *Deleted

## 2023-12-10 NOTE — Telephone Encounter (Signed)
Copied from CRM [id number]. Topic: General - Other >> Dec 10, 2023 11:47 AM [place] A wrote: Reason for CRM: Patient wants to leave a message with Lupita Leash to schedule a appointment with a Cardiologist

## 2023-12-10 NOTE — Telephone Encounter (Signed)
Spoke to Kristy [name] and advised [facility] had tried to get in touch with her on 11/29/2023.  Phone number provided to that office.  She will call to schedule.

## 2023-12-10 NOTE — Telephone Encounter (Signed)
Per notes in the referral, they have tried reaching the patient.  Patient can return their call to schedule.   Earl Lagos KEldridge      11/28/2499:49:15 PM Comments:   11/29/23 01:48 PM Called 1X to sch referral appt, "VM is not set up". -AF

## 2023-12-10 NOTE — Telephone Encounter (Signed)
Referral placed in Epic 11/23/2023.  Will forward to referral team to follow up on.

## 2023-12-12 ENCOUNTER — Other Ambulatory Visit: Payer: Self-pay | Admitting: Family Medicine

## 2023-12-12 DIAGNOSIS — I517 Cardiomegaly: Secondary | ICD-10-CM

## 2023-12-12 DIAGNOSIS — I10 Essential (primary) hypertension: Secondary | ICD-10-CM

## 2023-12-12 DIAGNOSIS — I773 Arterial fibromuscular dysplasia: Secondary | ICD-10-CM

## 2023-12-12 DIAGNOSIS — R931 Abnormal findings on diagnostic imaging of heart and coronary circulation: Secondary | ICD-10-CM

## 2023-12-12 DIAGNOSIS — I5189 Other ill-defined heart diseases: Secondary | ICD-10-CM

## 2024-01-08 ENCOUNTER — Encounter: Payer: Self-pay | Admitting: Cardiovascular Disease

## 2024-01-08 ENCOUNTER — Ambulatory Visit: Payer: Medicare Other | Attending: Cardiovascular Disease | Admitting: Cardiovascular Disease

## 2024-01-08 VITALS — BP 146/78 | HR 79 | Ht 62.0 in | Wt 172.4 lb

## 2024-01-08 DIAGNOSIS — E782 Mixed hyperlipidemia: Secondary | ICD-10-CM

## 2024-01-08 DIAGNOSIS — I773 Arterial fibromuscular dysplasia: Secondary | ICD-10-CM

## 2024-01-08 DIAGNOSIS — I10 Essential (primary) hypertension: Secondary | ICD-10-CM | POA: Diagnosis not present

## 2024-01-08 DIAGNOSIS — I5189 Other ill-defined heart diseases: Secondary | ICD-10-CM

## 2024-01-08 NOTE — Assessment & Plan Note (Signed)
 History of essential hypertension with blood pressure measured today at 146/78.  She says her blood pressure is usually in the 110-120/60-70 range at home.  She is on losartan, metoprolol and Maxide.

## 2024-01-08 NOTE — Progress Notes (Signed)
 01/08/2024 Kristy [name]   December 29, 1940  [id number]  Primary Physician Excell Seltzer, MD Primary Cardiologist: Runell Gess MD Nicholes Calamity, [place]  HPI:  Kristy [name] is a 83 y.o. mildly overweight widowed African-American female with no children referred by Dr. Kerby Nora because of hypertension and an abnormal 2D echocardiogram.  She is retired from working for a [employer] as a Training and development officer.  She is originally from [place] and moved down to [place].  Her risk factors include treated hypertension and hyperlipidemia.  There is no family history for heart disease.  She is never had heart attack or stroke.  She has had hypertension for the last 30 years.  She did have renal Doppler studies performed 10/08/2023 that showed no evidence of renal artery stenosis.  She does occasionally get dyspnea on exertion.  She checks her blood pressure at home and it usually runs in the 110-120/60-70 range.  2D echo performed 12/06/2023 revealed normal LV systolic function, mild asymmetric septal hypertrophy with grade 1 diastolic function.   Current Meds  Medication Sig   aspirin EC 81 MG tablet Take 81 mg by mouth daily. Swallow whole.   atorvastatin (LIPITOR) 20 MG tablet TAKE 1 TABLET BY MOUTH EVERY DAY   losartan (COZAAR) 50 MG tablet TAKE 1 TABLET BY MOUTH EVERY DAY   metoprolol succinate (TOPROL-XL) 100 MG 24 hr tablet TAKE 1 TABLET BY MOUTH DAILY  WITH OR IMMEDIATELY FOLLOWING A  MEAL   Multiple Vitamin (MULTIVITAMIN WITH MINERALS) TABS tablet Take 2 tablets by mouth daily.   sodium chloride (OCEAN) 0.65 % SOLN nasal spray Place 1 spray into both nostrils every 4 (four) hours as needed for congestion.   tamoxifen (NOLVADEX) 20 MG tablet Take 1 tablet (20 mg total) by mouth daily.   triamterene-hydrochlorothiazide (MAXZIDE-25) 37.5-25 MG tablet TAKE 1 TABLET BY MOUTH DAILY     Allergies  Allergen Reactions   Vibramycin [Doxycycline]     Many years ago  - "made me feel funny"    Social History   Socioeconomic History   Marital status: Widowed    Spouse name: Not on file   Number of children: 0   Years of education: Not on file   Highest education level: Not on file  Occupational History   Occupation: Educational psychologist: RETIRED    Comment: Retired   Occupation: retired  Tobacco Use   Smoking status: Former    Current packs/day: 0.00    Average packs/day: 1 pack/day for 15.0 years (15.0 ttl pk-yrs)    Types: Cigarettes    Start date: 11/21/1967    Quit date: 11/20/1982    Years since quitting: 41.1   Smokeless tobacco: Never   Tobacco comments:    15 pack year history  Vaping Use   Vaping status: Never Used  Substance and Sexual Activity   Alcohol use: No   Drug use: No   Sexual activity: Not on file  Other Topics Concern   Not on file  Social History Narrative   Widowed in 2007-spouse died from colon cancer.   Regular exercise at [facility].   Diet consists of fruits and veggies, snacks a lot.       Social Drivers of Health   Financial Resource Strain: Low Risk  (03/15/2023)   Overall Financial Resource Strain (CARDIA)    Difficulty of Paying Living Expenses: Not hard at all  Food Insecurity: No Food Insecurity (03/15/2023)  Hunger Vital Sign    Worried About Running Out of Food in the Last Year: Never true    Ran Out of Food in the Last Year: Never true  Transportation Needs: No Transportation Needs (03/15/2023)   PRAPARE - Administrator, Civil Service (Medical): No    Lack of Transportation (Non-Medical): No  Physical Activity: Inactive (03/15/2023)   Exercise Vital Sign    Days of Exercise per Week: 0 days    Minutes of Exercise per Session: 0 min  Stress: No Stress Concern Present (03/15/2023)   [employer] of Occupational Health - Occupational Stress Questionnaire    Feeling of Stress : Only a little  Social Connections: Moderately Isolated (03/15/2023)   Social Connection and  Isolation Panel [NHANES]    Frequency of Communication with Friends and Family: More than three times a week    Frequency of Social Gatherings with Friends and Family: More than three times a week    Attends Religious Services: Never    Database administrator or Organizations: No    Attends Banker Meetings: Never    Marital Status: Married  Catering manager Violence: Not At Risk (03/15/2023)   Humiliation, Afraid, Rape, and Kick questionnaire    Fear of Current or Ex-Partner: No    Emotionally Abused: No    Physically Abused: No    Sexually Abused: No     Review of Systems: General: negative for chills, fever, night sweats or weight changes.  Cardiovascular: negative for chest pain, dyspnea on exertion, edema, orthopnea, palpitations, paroxysmal nocturnal dyspnea or shortness of breath Dermatological: negative for rash Respiratory: negative for cough or wheezing Urologic: negative for hematuria Abdominal: negative for nausea, vomiting, diarrhea, bright red blood per rectum, melena, or hematemesis Neurologic: negative for visual changes, syncope, or dizziness All other systems reviewed and are otherwise negative except as noted above.    Blood pressure (!) 146/78, pulse 79, height 5\' 2"  (1.575 m), weight 172 lb 6.4 oz (78.2 kg), SpO2 99%.  General appearance: alert and no distress Neck: no adenopathy, no carotid bruit, no JVD, supple, symmetrical, trachea midline, and thyroid not enlarged, symmetric, no tenderness/mass/nodules Lungs: clear to auscultation bilaterally Heart: regular rate and rhythm, S1, S2 normal, no murmur, click, rub or gallop Extremities: extremities normal, atraumatic, no cyanosis or edema Pulses: 2+ and symmetric Skin: Skin color, texture, turgor normal. No rashes or lesions Neurologic: Grossly normal  EKG not performed today      ASSESSMENT AND PLAN:   Hyperlipidemia History of hyperlipidemia on statin therapy with lipid profile performed  04/19/2023 revealing total cholesterol of 124, LDL 47 and HDL 49.  HYPERTENSION, BENIGN ESSENTIAL, LABILE History of essential hypertension with blood pressure measured today at 146/78.  She says her blood pressure is usually in the 110-120/60-70 range at home.  She is on losartan, metoprolol and Maxide.  Fibromuscular dysplasia (HCC) There is a mention of FMD in her chart.  Renal Doppler studies performed 10/08/2023 showed normal velocities and dimensions with renal aortic ratios of 1.88 on the right and 1.27 on the left suggesting no evidence of obstructive disease or renal vascular hypertension.  Diastolic dysfunction 2D echo performed 12/06/2023 revealed normal LV systolic function, mild asymmetric septal hypertrophy with grade 1 diastolic dysfunction and no other valvular abnormalities.     Runell Gess MD FACP,FACC,FAHA, [facility] 01/08/2024 11:42 AM

## 2024-01-08 NOTE — Patient Instructions (Signed)
 Medication Instructions:  Your physician recommends that you continue on your current medications as directed. Please refer to the Current Medication list given to you today.  *If you need a refill on your cardiac medications before your next appointment, please call your pharmacy*   Testing/Procedures: Dr. Allyson Sabal has ordered a CT coronary calcium score.   Test locations:  [facility]  [facility]  This is $99 out of pocket.   Coronary CalciumScan A coronary calcium scan is an imaging test used to look for deposits of calcium and other fatty materials (plaques) in the inner lining of the blood vessels of the heart (coronary arteries). These deposits of calcium and plaques can partly clog and narrow the coronary arteries without producing any symptoms or warning signs. This puts a person at risk for a heart attack. This test can detect these deposits before symptoms develop. Tell a health care provider about: Any allergies you have. All medicines you are taking, including vitamins, herbs, eye drops, creams, and over-the-counter medicines. Any problems you or family members have had with anesthetic medicines. Any blood disorders you have. Any surgeries you have had. Any medical conditions you have. Whether you are pregnant or may be pregnant. What are the risks? Generally, this is a safe procedure. However, problems may occur, including: Harm to a pregnant woman and her unborn baby. This test involves the use of radiation. Radiation exposure can be dangerous to a pregnant woman and her unborn baby. If you are pregnant, you generally should not have this procedure done. Slight increase in the risk of cancer. This is because of the radiation involved in the test. What happens before the procedure? No preparation is needed for this procedure. What happens during the procedure? You will undress and  remove any jewelry around your neck or chest. You will put on a hospital gown. Sticky electrodes will be placed on your chest. The electrodes will be connected to an electrocardiogram (ECG) machine to record a tracing of the electrical activity of your heart. A CT scanner will take pictures of your heart. During this time, you will be asked to lie still and hold your breath for 2-3 seconds while a picture of your heart is being taken. The procedure may vary among health care providers and hospitals. What happens after the procedure? You can get dressed. You can return to your normal activities. It is up to you to get the results of your test. Ask your health care provider, or the department that is doing the test, when your results will be ready. Summary A coronary calcium scan is an imaging test used to look for deposits of calcium and other fatty materials (plaques) in the inner lining of the blood vessels of the heart (coronary arteries). Generally, this is a safe procedure. Tell your health care provider if you are pregnant or may be pregnant. No preparation is needed for this procedure. A CT scanner will take pictures of your heart. You can return to your normal activities after the scan is done. This information is not intended to replace advice given to you by your health care provider. Make sure you discuss any questions you have with your health care provider. Document Released: 05/04/2008 Document Revised: 09/25/2016 Document Reviewed: 09/25/2016 Elsevier Interactive Patient Education  2017 [employer].    Follow-Up: At [facility], you and your health needs are our priority.  As part of our continuing  mission to provide you with exceptional heart care, we have created designated Provider Care Teams.  These Care Teams include your primary Cardiologist (physician) and Advanced Practice Providers (APPs -  Physician Assistants and Nurse Practitioners) who all work together to  provide you with the care you need, when you need it.  We recommend signing up for the patient portal called "MyChart".  Sign up information is provided on this After Visit Summary.  MyChart is used to connect with patients for Virtual Visits (Telemedicine).  Patients are able to view lab/test results, encounter notes, upcoming appointments, etc.  Non-urgent messages can be sent to your provider as well.   To learn more about what you can do with MyChart, go to [email or web address].    Your next appointment:   6 month(s)  Provider:   Nanetta Batty, MD     Other Instructions   1st Floor: - Lobby - Registration  - Pharmacy  - Lab - Cafe  2nd Floor: - PV Lab - Diagnostic Testing (echo, CT, nuclear med)  3rd Floor: - Vacant  4th Floor: - TCTS (cardiothoracic surgery) - AFib Clinic - Structural Heart Clinic - Vascular Surgery  - Vascular Ultrasound  5th Floor: - [facility]Care Cardiology (general and EP) - Clinical Pharmacy for coumadin, hypertension, lipid, weight-loss medications, and med management appointments    Valet parking services will be available as well.

## 2024-01-08 NOTE — Assessment & Plan Note (Signed)
 History of hyperlipidemia on statin therapy with lipid profile performed 04/19/2023 revealing total cholesterol of 124, LDL 47 and HDL 49.

## 2024-01-08 NOTE — Assessment & Plan Note (Signed)
 There is a mention of FMD in her chart.  Renal Doppler studies performed 10/08/2023 showed normal velocities and dimensions with renal aortic ratios of 1.88 on the right and 1.27 on the left suggesting no evidence of obstructive disease or renal vascular hypertension.

## 2024-01-08 NOTE — Assessment & Plan Note (Signed)
 2D echo performed 12/06/2023 revealed normal LV systolic function, mild asymmetric septal hypertrophy with grade 1 diastolic dysfunction and no other valvular abnormalities.

## 2024-01-18 ENCOUNTER — Other Ambulatory Visit (HOSPITAL_COMMUNITY): Payer: Medicare Other

## 2024-01-21 ENCOUNTER — Other Ambulatory Visit: Payer: Self-pay | Admitting: Family Medicine

## 2024-01-22 DIAGNOSIS — H0288A Meibomian gland dysfunction right eye, upper and lower eyelids: Secondary | ICD-10-CM | POA: Diagnosis not present

## 2024-01-22 DIAGNOSIS — H52213 Irregular astigmatism, bilateral: Secondary | ICD-10-CM | POA: Diagnosis not present

## 2024-01-22 DIAGNOSIS — H04123 Dry eye syndrome of bilateral lacrimal glands: Secondary | ICD-10-CM | POA: Diagnosis not present

## 2024-01-22 DIAGNOSIS — H524 Presbyopia: Secondary | ICD-10-CM | POA: Diagnosis not present

## 2024-01-22 DIAGNOSIS — H25013 Cortical age-related cataract, bilateral: Secondary | ICD-10-CM | POA: Diagnosis not present

## 2024-01-22 DIAGNOSIS — H0288B Meibomian gland dysfunction left eye, upper and lower eyelids: Secondary | ICD-10-CM | POA: Diagnosis not present

## 2024-01-22 DIAGNOSIS — H2513 Age-related nuclear cataract, bilateral: Secondary | ICD-10-CM | POA: Diagnosis not present

## 2024-01-29 ENCOUNTER — Telehealth: Payer: Self-pay | Admitting: Cardiovascular Disease

## 2024-01-29 NOTE — Telephone Encounter (Signed)
 Want to know if she could reschedule her Carotid and Renal test for April instead of November

## 2024-01-29 NOTE — Telephone Encounter (Signed)
 Left message for pt to call back to discuss dopplers.

## 2024-01-30 NOTE — Telephone Encounter (Signed)
 Patient identification verified by 2 forms. Kristy Rail, RN    Called and spoke to patient  Patient states:   -inquiring about rescheduling CT cardiac scoring   -she may have a scheduling conflict   -did not know it would cost $99 Patient decided to keep 3/24 OV  Patient has no further questions or concerns at this time

## 2024-02-11 ENCOUNTER — Ambulatory Visit (HOSPITAL_COMMUNITY)
Admission: RE | Admit: 2024-02-11 | Discharge: 2024-02-11 | Disposition: A | Discharge status: Home / Self Care | Payer: Self-pay | Source: Ambulatory Visit | Attending: Cardiovascular Disease | Admitting: Cardiovascular Disease

## 2024-02-11 DIAGNOSIS — I10 Essential (primary) hypertension: Secondary | ICD-10-CM | POA: Insufficient documentation

## 2024-02-11 DIAGNOSIS — E782 Mixed hyperlipidemia: Secondary | ICD-10-CM | POA: Insufficient documentation

## 2024-02-15 ENCOUNTER — Inpatient Hospital Stay: Attending: Hematology and Oncology | Admitting: Physician Assistant

## 2024-02-15 ENCOUNTER — Telehealth: Payer: Self-pay | Admitting: *Deleted

## 2024-02-15 VITALS — BP 134/65 | HR 73 | Temp 97.6°F | Resp 16 | Ht 62.0 in | Wt 172.6 lb

## 2024-02-15 DIAGNOSIS — Z923 Personal history of irradiation: Secondary | ICD-10-CM | POA: Diagnosis not present

## 2024-02-15 DIAGNOSIS — D0511 Intraductal carcinoma in situ of right breast: Secondary | ICD-10-CM | POA: Insufficient documentation

## 2024-02-15 DIAGNOSIS — Z87891 Personal history of nicotine dependence: Secondary | ICD-10-CM | POA: Insufficient documentation

## 2024-02-15 DIAGNOSIS — Z79810 Long term (current) use of selective estrogen receptor modulators (SERMs): Secondary | ICD-10-CM | POA: Insufficient documentation

## 2024-02-15 DIAGNOSIS — N61 Mastitis without abscess: Secondary | ICD-10-CM | POA: Insufficient documentation

## 2024-02-15 MED ORDER — CEPHALEXIN 500 MG PO CAPS: ORAL_CAPSULE | ORAL | 0 refills | Status: AC

## 2024-02-15 NOTE — Telephone Encounter (Signed)
 Received call from pt with complaint of right breast redness, pain and warmth.  Pt denies recent injury or trauma.  Per MD pt needing to see [facility].  Appt scheduled, pt notified and verbalized understanding.

## 2024-02-15 NOTE — Patient Instructions (Signed)
 Prescription for Keflex sent to your pharmacy. You can start taking it tomorrow.  The directions are written on the bottle.  You will take the medication every 6 hours.   For example-   8 am, 2 pm, 8 pm, and if you stay up as late as possible for that last dose, ideally around 12:30 am.

## 2024-02-15 NOTE — Progress Notes (Signed)
 Symptom Management Consult Note [facility]    Patient Care Team: Excell Seltzer, MD as PCP - General Harriette Bouillon, MD as Consulting Physician (General Surgery) Dorothy Puffer, MD as Consulting Physician (Radiation Oncology) Meryl Dare, MD (Inactive) as Consulting Physician (Gastroenterology) Pershing Proud, RN as Oncology Nurse Navigator Donnelly Angelica, RN as Oncology Nurse Navigator [place], Garry Heater, [facility] (Inactive) as Pharmacist (Pharmacist) Rachel Moulds, MD as Consulting Physician (Hematology and Oncology) Runell Gess, MD as Consulting Physician (Cardiology)    Name / MRN / DOB: Kristy [name]  [id number]  06-Oct-1941   Date of visit: 02/15/2024   Chief Complaint/Reason for visit: breast redness   Current Therapy: Tamoxifen  Assessment & Plan  Patient is a 83 y.o. female with oncologic history of noninvasive breast cancer, estrogen receptor positive followed by Dr. Al Pimple.  I have viewed most recent oncology note and lab work.  #Breast redness - Acute onset of redness in the previously radiated breast, noted this morning. No associated pain, swelling, or fever. Differential diagnosis includes radiation recall, cellulitis, less likely inflammatory breast cancer. Radiation recall is considered due to prior radiation therapy, and the absence of pain and other symptoms.  - Prescribe Keflex taper for 10 days. - Patient scheduled for follow-up appointment on 02/25/24 to reassess the breast. Will consider mammogram or biopsy if no improvement with antibiotics.  #Noninvasive breast cancer, estrogen receptor positive  - Last mammogram in December 2024 was negative for malignancy. - Continue tamoxifen as prescribed. - Next appointment with oncologist is 12/01/24.  Oncologist will be notified of this visit. Strict ED precautions discussed should symptoms worsen.   Heme/Onc History: Oncology History  Ductal carcinoma in situ (DCIS) of right  breast  09/10/2012 Initial Diagnosis   Ductal carcinoma in situ (DCIS) of right breast   12/27/2020 Cancer Staging   Staging form: Breast, AJCC 7th Edition - Pathologic: Stage 0 (Tis (DCIS), N0, cM0) - Signed by Lowella Dell, MD on 12/27/2020 Histologic grade (G): G3 Estrogen receptor status: Positive Progesterone receptor status: Negative       Interval history-: Kristy [name] is a 83 y.o. female with oncologic history as above presenting to [facility] today with chief complaint of right breast redness. Patient presents unaccompanied to visit today.  Discussed the use of AI scribe software for clinical note transcription with the patient, who gave verbal consent to proceed.  History of Present Illness Onset of symptoms is today. She noticed the entire breast became red after taking a shower around 11:30 AM today. She is unsure if the redness developed overnight. There is no associated pain, and she cannot determine if there is any swelling or change in size. No recent injuries, falls, or changes in medications, except for resuming daily aspirin and taking a multivitamin. She continues to take tamoxifen.  She experiences occasional chills but no fever. She mentions being more cold-natured since her cancer diagnosis. She denies any changes to her nipple, no discharge or bleeding.  She recalls experiencing a 'funny pain' last week, which was attributed to radiation effects.     ROS  All other systems are reviewed and are negative for acute change except as noted in the HPI.    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   Constipation    uses stool softener PRN- uses once a week to once every 2 weeks    Fibromuscular dysplasia (HCC)    Gallbladder disease    Hyperlipidemia    Hypertension    For 16 years; urinary catecholamines within normal limits 12/09; renal Doppler ultrasound showed no evidence for renal artery stenosis   Normal echocardiogram 02/2009   LVEF 65%; no regional wall motion abnormalities; normal RV size and function; pulmonic valve had increased gradient across w/ peak gradient of about 36 mmHg (range of moderate pulmonic stenosis) followup showed normal valve   Papilloma of breast    right   Personal history of radiation therapy    2022   Tinnitus of left ear    Tubular adenoma of colon 10/2013     Past Surgical History:  Procedure Laterality Date   ANGIOPLASTY     2012   BREAST BIOPSY Right 2006   benign   BREAST BIOPSY Right 11/09/2020   BREAST BIOPSY Right 11/23/2020   BREAST BIOPSY Right 11/29/2020   x2   BREAST EXCISIONAL BIOPSY Right 07/2017   benign   BREAST LUMPECTOMY Right 12/16/2020   BREAST LUMPECTOMY WITH RADIOACTIVE SEED LOCALIZATION Right 08/14/2017   Procedure: RIGHT BREAST LUMPECTOMY WITH RADIOACTIVE SEED LOCALIZATION;  Surgeon: Harriette Bouillon, MD;  Location: [facility];  Service: General;  Laterality: Right;   BREAST LUMPECTOMY WITH RADIOACTIVE SEED LOCALIZATION Right 12/16/2020   Procedure: RADIOACTIVE SEED GUIDED TIMES 3 RIGHT BREAST LUMPECTOMY;  Surgeon: Harriette Bouillon, MD;  Location: [facility];  Service: General;  Laterality: Right;   Cardiolyte  09/2006   Neg   CHOLECYSTECTOMY N/A 09/22/2021   Procedure: LAPAROSCOPIC CHOLECYSTECTOMY WITH  INTRAOPERATIVE CHOLANGIOGRAM;  Surgeon: Harriette Bouillon, MD;  Location: [facility];  Service: General;  Laterality: N/A;   COLONOSCOPY     KNEE CARTILAGE SURGERY     right knee    POLYPECTOMY     RE-EXCISION OF BREAST LUMPECTOMY Right 01/11/2021   Procedure: RE-EXCISION OF RIGHT BREAST LUMPECTOMY;  Surgeon: Harriette Bouillon, MD;  Location: [facility];  Service: General;  Laterality: Right;   TOTAL ABDOMINAL HYSTERECTOMY     no cervix   UPPER GASTROINTESTINAL ENDOSCOPY      Social History   Socioeconomic History   Marital status: Widowed    Spouse name: Not on file   Number of children: 0   Years of education: Not on file   Highest education level: Not on file  Occupational History   Occupation: Educational psychologist: RETIRED    Comment: Retired   Occupation: retired  Tobacco Use   Smoking status: Former    Current packs/day: 0.00    Average packs/day: 1 pack/day for 15.0 years (15.0 ttl pk-yrs)    Types: Cigarettes    Start date: 11/21/1967    Quit date: 11/20/1982    Years since quitting: 41.2   Smokeless tobacco: Never   Tobacco comments:    15 pack year history  Vaping Use   Vaping status: Never Used  Substance and Sexual Activity   Alcohol use: No  Drug use: No   Sexual activity: Not on file  Other Topics Concern   Not on file  Social History Narrative   Widowed in 2007-spouse died from colon cancer.   Regular exercise at [facility].   Diet consists of fruits and veggies, snacks a lot.       Social Drivers of Corporate investment banker Strain: Low Risk  (03/15/2023)   Overall Financial Resource Strain (CARDIA)    Difficulty of Paying Living Expenses: Not hard at all  Food Insecurity: No Food Insecurity (03/15/2023)   Hunger Vital Sign    Worried About Running Out of Food in the Last Year: Never true    Ran Out of Food in the Last Year: Never true  Transportation Needs: No Transportation Needs (03/15/2023)   PRAPARE - Administrator, Civil Service (Medical): No    Lack of Transportation (Non-Medical): No  Physical Activity: Inactive (03/15/2023)   Exercise Vital Sign    Days of Exercise per Week: 0 days     Minutes of Exercise per Session: 0 min  Stress: No Stress Concern Present (03/15/2023)   [employer] of Occupational Health - Occupational Stress Questionnaire    Feeling of Stress : Only a little  Social Connections: Moderately Isolated (03/15/2023)   Social Connection and Isolation Panel [NHANES]    Frequency of Communication with Friends and Family: More than three times a week    Frequency of Social Gatherings with Friends and Family: More than three times a week    Attends Religious Services: Never    Database administrator or Organizations: No    Attends Banker Meetings: Never    Marital Status: Married  Catering manager Violence: Not At Risk (03/15/2023)   Humiliation, Afraid, Rape, and Kick questionnaire    Fear of Current or Ex-Partner: No    Emotionally Abused: No    Physically Abused: No    Sexually Abused: No    Family History  Problem Relation Age of Onset   Prostate cancer Father    Lung cancer Father    Colon polyps Sister    Colon cancer Maternal Uncle    Colon cancer Paternal Uncle    Coronary artery disease Paternal Grandmother    Goiter Other        ?   Esophageal cancer Neg Hx    Stomach cancer Neg Hx    Rectal cancer Neg Hx    Bladder Cancer Neg Hx    Kidney cancer Neg Hx    BRCA 1/2 Neg Hx    Breast cancer Neg Hx      Current Outpatient Medications:    [START ON 02/16/2024] cephALEXin (KEFLEX) 500 MG capsule, Take 2 capsules (1,000 mg total) by mouth every 6 (six) hours for 2 days, THEN 2 capsules (1,000 mg total) every 6 (six) hours for 8 days., Disp: 80 capsule, Rfl: 0   aspirin EC 81 MG tablet, Take 81 mg by mouth daily. Swallow whole., Disp: , Rfl:    atorvastatin (LIPITOR) 20 MG tablet, TAKE 1 TABLET BY MOUTH EVERY DAY, Disp: 90 tablet, Rfl: 3   losartan (COZAAR) 50 MG tablet, TAKE 1 TABLET BY MOUTH EVERY DAY, Disp: 90 tablet, Rfl: 3   metoprolol succinate (TOPROL-XL) 100 MG 24 hr tablet, TAKE 1 TABLET BY MOUTH DAILY  WITH OR  IMMEDIATELY FOLLOWING A  MEAL, Disp: 100 tablet, Rfl: 0   Multiple Vitamin (MULTIVITAMIN WITH MINERALS) TABS tablet, Take 2 tablets by mouth  daily., Disp: , Rfl:    sodium chloride (OCEAN) 0.65 % SOLN nasal spray, Place 1 spray into both nostrils every 4 (four) hours as needed for congestion., Disp: , Rfl:    tamoxifen (NOLVADEX) 20 MG tablet, Take 1 tablet (20 mg total) by mouth daily., Disp: 90 tablet, Rfl: 3   triamterene-hydrochlorothiazide (MAXZIDE-25) 37.5-25 MG tablet, TAKE 1 TABLET BY MOUTH DAILY, Disp: 100 tablet, Rfl: 2  PHYSICAL EXAM: ECOG FS:1 - Symptomatic but completely ambulatory    Vitals:   02/15/24 1420  BP: 134/65  Pulse: 73  Resp: 16  Temp: 97.6 F (36.4 C)  TempSrc: Temporal  SpO2: 100%  Weight: 172 lb 9.6 oz (78.3 kg)  Height: 5\' 2"  (1.575 m)   Physical Exam Vitals and nursing note reviewed.  Constitutional:      Appearance: She is not ill-appearing or toxic-appearing.  HENT:     Head: Normocephalic.  Eyes:     Conjunctiva/sclera: Conjunctivae normal.  Cardiovascular:     Rate and Rhythm: Normal rate and regular rhythm.     Pulses: Normal pulses.     Heart sounds: Normal heart sounds.  Pulmonary:     Effort: Pulmonary effort is normal.     Breath sounds: Normal breath sounds.  Chest:     Comments: Erythema noted to right breast. Blanches. No warmth or tenderness. No palpable masses or axillary nodes. Please see media below. Patient gave verbal permission to utilize photo for medical documentation only. Abdominal:     General: There is no distension.  Musculoskeletal:     Cervical back: Normal range of motion.  Skin:    General: Skin is warm and dry.  Neurological:     Mental Status: She is alert.        LABORATORY DATA: I have reviewed the data as listed    Latest Ref Rng & Units 11/12/2023   12:43 PM 08/29/2023    1:14 PM 08/21/2022    1:30 PM  CBC  WBC 4.0 - 10.5 K/uL 6.2  6.8  9.5   Hemoglobin 12.0 - 15.0 g/dL 40.9  81.1  91.4    Hematocrit 36.0 - 46.0 % 36.8  39.1  40.9   Platelets 150 - 400 K/uL 280  298.0  337         Latest Ref Rng & Units 11/12/2023   12:43 PM 08/29/2023    1:14 PM 04/19/2023    9:24 AM  CMP  Glucose 70 - 99 mg/dL 782  956  213   BUN 8 - 23 mg/dL 14  15  14    Creatinine 0.44 - 1.00 mg/dL 0.86  5.78  4.69   Sodium 135 - 145 mmol/L 137  140  140   Potassium 3.5 - 5.1 mmol/L 3.4  3.7  4.0   Chloride 98 - 111 mmol/L 103  101  105   CO2 22 - 32 mmol/L 25  30  26    Calcium 8.9 - 10.3 mg/dL 9.7  62.9  9.5   Total Protein 6.5 - 8.1 g/dL 7.2  6.6  6.9   Total Bilirubin <1.2 mg/dL 0.5  0.4  0.4   Alkaline Phos 38 - 126 U/L 42  48  37   AST 15 - 41 U/L 29  21  19    ALT 0 - 44 U/L 28  20  14         RADIOGRAPHIC STUDIES (from last 24 hours if applicable) I have personally reviewed the radiological images as listed  and agreed with the findings in the report. No results found.      Visit Diagnosis: 1. Ductal carcinoma in situ (DCIS) of right breast   2. Cellulitis of right breast      No orders of the defined types were placed in this encounter.   All questions were answered. The patient knows to call the clinic with any problems, questions or concerns. No barriers to learning was detected.  A total of more than 30 minutes were spent on this encounter with face-to-face time and non-face-to-face time, including preparing to see the patient, ordering tests and/or medications, counseling the patient and coordination of care as outlined above.    Thank you for allowing me to participate in the care of this patient.    Shanon Ace, PA-C Department of Hematology/Oncology [facility] at [facility] Phone: [phone]  [phone]    02/15/2024 9:51 PM

## 2024-02-22 ENCOUNTER — Telehealth: Payer: Self-pay | Admitting: *Deleted

## 2024-02-25 ENCOUNTER — Encounter: Admitting: Physician Assistant

## 2024-03-05 DIAGNOSIS — H25013 Cortical age-related cataract, bilateral: Secondary | ICD-10-CM | POA: Diagnosis not present

## 2024-03-05 DIAGNOSIS — H1712 Central corneal opacity, left eye: Secondary | ICD-10-CM | POA: Diagnosis not present

## 2024-03-05 DIAGNOSIS — H2513 Age-related nuclear cataract, bilateral: Secondary | ICD-10-CM | POA: Diagnosis not present

## 2024-03-05 DIAGNOSIS — H40013 Open angle with borderline findings, low risk, bilateral: Secondary | ICD-10-CM | POA: Diagnosis not present

## 2024-03-05 DIAGNOSIS — H2511 Age-related nuclear cataract, right eye: Secondary | ICD-10-CM | POA: Diagnosis not present

## 2024-03-07 ENCOUNTER — Encounter

## 2024-03-18 ENCOUNTER — Ambulatory Visit: Payer: Self-pay

## 2024-03-18 NOTE — Telephone Encounter (Signed)
 No entry

## 2024-03-18 NOTE — Telephone Encounter (Signed)
  Chief Complaint: cough, sneezing, runny nose Symptoms: see above. Cough with yellowish sputum at times. Watery eyes, SOB at times. Using flonase with no relief. Sx not going away dizzy when bending over. Frequency: couple of weeks  Pertinent Negatives: Patient denies chest pain no difficulty breathing no fever Disposition: [] ED /[] Urgent Care (no appt availability in office) / [x] Appointment(In office/virtual)/ []  [place] [facility]/ [] Home Care/ [] Refused Recommended Disposition /[] [place] [facility]/ []  Follow-up with PCP Additional Notes:   Appt scheduled for 03/20/24.      Reason for Disposition  Cough has been present for > 3 weeks    Couple of weeks  Answer Assessment - Initial Assessment Questions 1. ONSET: "When did the cough begin?"      Dry cough, started last Friday  2. SEVERITY: "How bad is the cough today?"      Every now and then 3. SPUTUM: "Describe the color of your sputum" (none, dry cough; clear, white, yellow, green)     Yellowish  4. HEMOPTYSIS: "Are you coughing up any blood?" If so ask: "How much?" (flecks, streaks, tablespoons, etc.)     na 5. DIFFICULTY BREATHING: "Are you having difficulty breathing?" If Yes, ask: "How bad is it?" (e.g., mild, moderate, severe)    - MILD: No SOB at rest, mild SOB with walking, speaks normally in sentences, can lie down, no retractions, pulse < 100.    - MODERATE: SOB at rest, SOB with minimal exertion and prefers to sit, cannot lie down flat, speaks in phrases, mild retractions, audible wheezing, pulse 100-120.    - SEVERE: Very SOB at rest, speaks in single words, struggling to breathe, sitting hunched forward, retractions, pulse > 120      SOB at times. 6. FEVER: "Do you have a fever?" If Yes, ask: "What is your temperature, how was it measured, and when did it start?"     no 7. CARDIAC HISTORY: "Do you have any history of heart disease?" (e.g., heart attack, congestive heart failure)      See hx  8. LUNG HISTORY:  "Do you have any history of lung disease?"  (e.g., pulmonary embolus, asthma, emphysema)     na 9. PE RISK FACTORS: "Do you have a history of blood clots?" (or: recent major surgery, recent prolonged travel, bedridden)     na 10. OTHER SYMPTOMS: "Do you have any other symptoms?" (e.g., runny nose, wheezing, chest pain)       Cough productive  yellowish ,  runny nose , sneezing watery eyes 11. PREGNANCY: "Is there any chance you are pregnant?" "When was your last menstrual period?"       na 12. TRAVEL: "Have you traveled out of the country in the last month?" (e.g., travel history, exposures)       na  Protocols used: Cough - Acute Non-Productive-A-AH

## 2024-03-18 NOTE — Telephone Encounter (Signed)
 Copied from CRM [id number]. Topic: Clinical - Medical Advice >> Mar 18, 2024  2:05 PM Kristy [name] wrote: Reason for CRM: Patient is calling to see if she needs to be seen. She does not have a fever but she has sneezing congestion runny nose. She thinks its more than allergies. She would like a call to see what she should do

## 2024-03-18 NOTE — Telephone Encounter (Signed)
 Noted.

## 2024-03-20 ENCOUNTER — Ambulatory Visit: Admitting: Family Medicine

## 2024-03-31 ENCOUNTER — Other Ambulatory Visit: Payer: Self-pay | Admitting: Family Medicine

## 2024-04-01 ENCOUNTER — Ambulatory Visit (INDEPENDENT_AMBULATORY_CARE_PROVIDER_SITE_OTHER)

## 2024-04-01 ENCOUNTER — Telehealth: Payer: Self-pay | Admitting: Gastroenterology

## 2024-04-01 VITALS — BP 134/65 | Ht 62.0 in | Wt 170.0 lb

## 2024-04-01 DIAGNOSIS — Z0000 Encounter for general adult medical examination without abnormal findings: Secondary | ICD-10-CM | POA: Diagnosis not present

## 2024-04-01 DIAGNOSIS — Z5320 Procedure and treatment not carried out because of patient's decision for unspecified reasons: Secondary | ICD-10-CM | POA: Diagnosis not present

## 2024-04-01 DIAGNOSIS — Z2821 Immunization not carried out because of patient refusal: Secondary | ICD-10-CM | POA: Diagnosis not present

## 2024-04-01 NOTE — Progress Notes (Signed)
 Because this visit was a virtual/telehealth visit,  certain criteria was not obtained, such a blood pressure, CBG if applicable, and timed get up and go. Any medications not marked as "taking" were not mentioned during the medication reconciliation part of the visit. Any vitals not documented were not able to be obtained due to this being a telehealth visit or patient was unable to self-report a recent blood pressure reading due to a lack of equipment at home via telehealth. Vitals that have been documented are verbally provided by the patient.   This visit was performed by a medical professional under my direct supervision. I was immediately available for consultation/collaboration. I have reviewed and agree with the Annual Wellness Visit documentation.  Subjective:   Kristy [name] is a 83 y.o. who presents for a Medicare Wellness preventive visit.  As a reminder, Annual Wellness Visits don't include a physical exam, and some assessments may be limited, especially if this visit is performed virtually. We may recommend an in-person visit if needed.  Visit Complete: Virtual I connected with  Kristy [name] on 04/01/24 by a audio enabled telemedicine application and verified that I am speaking with the correct person using two identifiers.  Patient Location: Home  Provider Location: Home Office  I discussed the limitations of evaluation and management by telemedicine. The patient expressed understanding and agreed to proceed.  Vital Signs: Because this visit was a virtual/telehealth visit, some criteria may be missing or patient reported. Any vitals not documented were not able to be obtained and vitals that have been documented are patient reported.  VideoDeclined- This patient declined Librarian, academic. Therefore the visit was completed with audio only.  Persons Participating in Visit: Patient.  AWV Questionnaire: Yes: Patient Medicare AWV questionnaire  was completed by the patient on 03/25/2024; I have confirmed that all information answered by patient is correct and no changes since this date.  Cardiac Risk Factors include: advanced age (>32men, >73 women);hypertension;obesity (BMI >30kg/m2)     Objective:     Today's Vitals   03/25/24 1839 04/01/24 1402  BP:  134/65  Weight:  170 lb (77.1 kg)  Height:  5\' 2"  (1.575 m)  PainSc: 3     Body mass index is 31.09 kg/m.     04/01/2024    2:01 PM 03/15/2023   10:03 AM 08/21/2022    1:05 PM 03/13/2022   12:10 PM 09/15/2021    1:14 PM 03/03/2021   10:35 AM 02/08/2021    9:11 AM  Advanced Directives  Does Patient Have a Medical Advance Directive? No Yes No No No No No  Type of Furniture conservator/restorer;Living will       Copy of Healthcare Power of Attorney in Chart?  No - copy requested       Would patient like information on creating a medical advance directive? No - Patient declined   No - Patient declined Yes (Inpatient - patient defers creating a medical advance directive at this time - Information given) No - Patient declined No - Patient declined    Current Medications (verified) Outpatient Encounter Medications as of 04/01/2024  Medication Sig   aspirin EC 81 MG tablet Take 81 mg by mouth daily. Swallow whole.   atorvastatin  (LIPITOR) 20 MG tablet TAKE 1 TABLET BY MOUTH EVERY DAY   losartan  (COZAAR ) 50 MG tablet TAKE 1 TABLET BY MOUTH EVERY DAY   metoprolol  succinate (TOPROL -XL) 100 MG 24 hr tablet TAKE 1  TABLET BY MOUTH DAILY  WITH OR IMMEDIATELY FOLLOWING A  MEAL   Multiple Vitamin (MULTIVITAMIN WITH MINERALS) TABS tablet Take 2 tablets by mouth daily.   sodium chloride  (OCEAN) 0.65 % SOLN nasal spray Place 1 spray into both nostrils every 4 (four) hours as needed for congestion.   tamoxifen  (NOLVADEX ) 20 MG tablet Take 1 tablet (20 mg total) by mouth daily.   triamterene -hydrochlorothiazide (MAXZIDE-25) 37.5-25 MG tablet TAKE 1 TABLET BY MOUTH DAILY    No facility-administered encounter medications on file as of 04/01/2024.    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   Constipation    uses stool softener PRN- uses once a week to once every 2 weeks    Fibromuscular dysplasia (HCC)    Gallbladder disease    Hyperlipidemia    Hypertension    For 16 years; urinary catecholamines within normal limits 12/09; renal Doppler ultrasound showed no evidence for renal artery stenosis   Normal echocardiogram 02/2009   LVEF 65%; no regional wall motion abnormalities; normal RV size and function; pulmonic valve had increased gradient across w/ peak gradient of about 36 mmHg (range of moderate pulmonic stenosis) followup showed normal valve   Papilloma of breast    right   Personal history of radiation therapy    2022   Tinnitus of left ear    Tubular adenoma of colon 10/2013   Past Surgical History:  Procedure Laterality Date   ANGIOPLASTY     2012   BREAST BIOPSY Right 2006   benign   BREAST BIOPSY Right 11/09/2020   BREAST BIOPSY Right 11/23/2020   BREAST BIOPSY Right 11/29/2020   x2   BREAST EXCISIONAL BIOPSY Right 07/2017   benign   BREAST LUMPECTOMY Right 12/16/2020   BREAST LUMPECTOMY WITH RADIOACTIVE SEED LOCALIZATION Right 08/14/2017   Procedure: RIGHT BREAST LUMPECTOMY WITH RADIOACTIVE SEED LOCALIZATION;  Surgeon: Sim Dryer, MD;  Location: [facility];  Service: General;  Laterality: Right;   BREAST LUMPECTOMY WITH RADIOACTIVE SEED LOCALIZATION Right 12/16/2020   Procedure: RADIOACTIVE SEED GUIDED  TIMES 3 RIGHT BREAST LUMPECTOMY;  Surgeon: Sim Dryer, MD;  Location: [facility];  Service: General;  Laterality: Right;   Cardiolyte  09/2006   Neg   CHOLECYSTECTOMY N/A 09/22/2021   Procedure: LAPAROSCOPIC CHOLECYSTECTOMY WITH  INTRAOPERATIVE CHOLANGIOGRAM;  Surgeon: Sim Dryer, MD;  Location: [facility];  Service: General;  Laterality: N/A;   COLONOSCOPY     KNEE CARTILAGE SURGERY     right knee   POLYPECTOMY     RE-EXCISION OF BREAST LUMPECTOMY Right 01/11/2021   Procedure: RE-EXCISION OF RIGHT BREAST LUMPECTOMY;  Surgeon: Sim Dryer, MD;  Location: [facility];  Service: General;  Laterality: Right;   TOTAL ABDOMINAL HYSTERECTOMY     no cervix   UPPER GASTROINTESTINAL ENDOSCOPY     Family History  Problem Relation Age of Onset   Prostate cancer Father    Lung cancer Father    Colon polyps Sister    Colon cancer Maternal Uncle  Colon cancer Paternal Uncle    Coronary artery disease Paternal Grandmother    Goiter Other        ?   Esophageal cancer Neg Hx    Stomach cancer Neg Hx    Rectal cancer Neg Hx    Bladder Cancer Neg Hx    Kidney cancer Neg Hx    BRCA 1/2 Neg Hx    Breast cancer Neg Hx    Social History   Socioeconomic History   Marital status: Widowed    Spouse name: Not on file   Number of children: 0   Years of education: Not on file   Highest education level: Some college, no degree  Occupational History   Occupation: Educational psychologist: RETIRED    Comment: Retired   Occupation: retired  Tobacco Use   Smoking status: Former    Current packs/day: 0.00    Average packs/day: 1 pack/day for 15.0 years (15.0 ttl pk-yrs)    Types: Cigarettes    Start date: 11/21/1967    Quit date: 11/20/1982    Years since quitting: 41.3   Smokeless tobacco: Never   Tobacco comments:    15 pack year history  Vaping Use   Vaping status: Never Used  Substance and Sexual Activity   Alcohol use: No   Drug use: No    Sexual activity: Not on file  Other Topics Concern   Not on file  Social History Narrative   Widowed in 2007-spouse died from colon cancer.   Regular exercise at [facility].   Diet consists of fruits and veggies, snacks a lot.       Social Drivers of Corporate investment banker Strain: Low Risk  (03/25/2024)   Overall Financial Resource Strain (CARDIA)    Difficulty of Paying Living Expenses: Not hard at all  Food Insecurity: No Food Insecurity (03/25/2024)   Hunger Vital Sign    Worried About Running Out of Food in the Last Year: Never true    Ran Out of Food in the Last Year: Never true  Transportation Needs: No Transportation Needs (03/25/2024)   PRAPARE - Administrator, Civil Service (Medical): No    Lack of Transportation (Non-Medical): No  Physical Activity: Unknown (03/25/2024)   Exercise Vital Sign    Days of Exercise per Week: 0 days    Minutes of Exercise per Session: Not on file  Stress: No Stress Concern Present (03/25/2024)   [employer] of Occupational Health - Occupational Stress Questionnaire    Feeling of Stress : Not at all  Social Connections: Socially Isolated (03/25/2024)   Social Connection and Isolation Panel [NHANES]    Frequency of Communication with Friends and Family: More than three times a week    Frequency of Social Gatherings with Friends and Family: Once a week    Attends Religious Services: Never    Database administrator or Organizations: No    Attends Banker Meetings: Never    Marital Status: Widowed    Tobacco Counseling Counseling given: Not Answered Tobacco comments: 15 pack year history    Clinical Intake:  Pre-visit preparation completed: Yes  Pain : 0-10 Pain Score: 3  Pain Type: Chronic pain Pain Location: Knee Pain Orientation: Right, Left Pain Descriptors / Indicators: Sharp Pain Onset: Today Pain Frequency: Rarely     BMI - recorded: 31.09 Nutritional Status: BMI > 30  Obese Nutritional Risks:  Other (Comment) Diabetes: No  Lab Results  Component Value Date   HGBA1C 6.2 04/19/2023   HGBA1C 6.1 04/04/2022   HGBA1C 6.1 03/25/2021     How often do you need to have someone help you when you read instructions, pamphlets, or other written materials from your doctor or pharmacy?: 1 - Never What is the last grade level you completed in school?: some college  Interpreter Needed?: No  Information entered by :: Juliann Ochoa   Activities of Daily Living     03/25/2024    6:39 PM  In your present state of health, do you have any difficulty performing the following activities:  Hearing? 0  Vision? 0  Difficulty concentrating or making decisions? 0  Walking or climbing stairs? 0  Dressing or bathing? 0  Doing errands, shopping? 0  Preparing Food and eating ? N  Using the Toilet? N  In the past six months, have you accidently leaked urine? N  Do you have problems with loss of bowel control? N  Managing your Medications? N  Managing your Finances? N  Housekeeping or managing your Housekeeping? N    Patient Care Team: Judithann Novas, MD as PCP - General Sim Dryer, MD as Consulting Physician (General Surgery) Johna Myers, MD as Consulting Physician (Radiation Oncology) Asencion Blacksmith, MD (Inactive) as Consulting Physician (Gastroenterology) Auther Bo, RN as Oncology Nurse Navigator Alane Hsu, RN as Oncology Nurse Navigator [place], Delaney Fearing, [facility] (Inactive) as Pharmacist (Pharmacist) Murleen Arms, MD as Consulting Physician (Hematology and Oncology) Avanell Leigh, MD as Consulting Physician (Cardiology)  Indicate any recent Medical Services you may have received from other than [facility] providers in the past year (date may be approximate).     Assessment:    This is a routine wellness examination for Kristy [name].  Hearing/Vision screen Hearing Screening - Comments:: Patient has difficulties hearing in left ear  Vision Screening - Comments::  Wears glasses   Goals Addressed             This Visit's Progress    Patient Stated   On track    11/18/2019, I will try to lose about 10 lbs and get to 160 lbs.        Depression Screen     04/01/2024    2:09 PM 08/29/2023   12:43 PM 03/15/2023    9:59 AM 03/13/2022   12:08 PM 03/03/2021   10:37 AM 11/18/2019    2:52 PM 11/05/2018   11:11 AM  PHQ 2/9 Scores  PHQ - 2 Score 0 0 1 0 4 0 0  PHQ- 9 Score 0 0   4 0 0    Fall Risk     03/25/2024    6:39 PM 08/29/2023   12:43 PM 03/15/2023   10:03 AM 02/02/2023    3:01 PM 03/13/2022   12:11 PM  Fall Risk   Falls in the past year? 0 0 0 0 0  Number falls in past yr: 0 0 0 0 0  Injury with Fall? 0 0 0 0 0  Risk for fall due to : No Fall Risks No Fall Risks No Fall Risks No Fall Risks No Fall Risks  Follow up Falls prevention discussed;Falls evaluation completed Falls evaluation completed Falls prevention discussed;Falls evaluation completed Falls evaluation completed Falls prevention discussed    MEDICARE RISK AT HOME:  Medicare Risk at Home Any stairs in or around the home?: No If so, are there any without handrails?: No Home free of loose throw rugs in walkways,  pet beds, electrical cords, etc?: Yes Adequate lighting in your home to reduce risk of falls?: Yes Life alert?: No Use of a cane, walker or w/c?: No Grab bars in the bathroom?: Yes Shower chair or bench in shower?: No Elevated toilet seat or a handicapped toilet?: No  TIMED UP AND GO:  Was the test performed?  No  Cognitive Function: 6CIT completed    03/03/2021   10:41 AM 11/18/2019    2:54 PM 11/05/2018   11:12 AM 09/04/2017   11:00 AM  MMSE - Mini Mental State Exam  Orientation to time 5 5 5 5   Orientation to Place 5 5 5 5   Registration 3 3 3 3   Attention/ Calculation 5 5 0 0  Recall 3 3 3 3   Language- name 2 objects   0 0  Language- repeat 1 1 1 1   Language- follow 3 step command   3 3  Language- read & follow direction   0 0  Write a sentence    0 0  Copy design   0 0  Total score   20 20        04/01/2024    2:05 PM 03/15/2023   10:04 AM 03/13/2022   12:13 PM  6CIT Screen  What Year? 0 points 0 points 0 points  What month? 0 points 0 points 0 points  What time? 0 points 0 points 0 points  Count back from 20 0 points 0 points 0 points  Months in reverse 0 points 0 points 2 points  Repeat phrase 0 points 0 points 0 points  Total Score 0 points 0 points 2 points    Immunizations Immunization History  Administered Date(s) Administered   Fluad Quad(high Dose 65+) 07/24/2019, 09/11/2020, 09/08/2022   Influenza Split 10/03/2011, 08/30/2012   Influenza Whole 08/28/2007, 08/19/2008, 08/12/2009, 09/14/2010   Influenza, High Dose Seasonal PF 09/07/2023   Influenza,inj,Quad PF,6+ Mos 08/13/2013, 08/18/2014, 09/03/2015, 08/03/2016, 09/04/2017, 09/05/2018   PFIZER(Purple Top)SARS-COV-2 Vaccination 12/24/2019, 01/14/2020, 08/22/2020   Pfizer(Comirnaty)Fall Seasonal Vaccine 12 years and older 10/18/2022   Pneumococcal Conjugate-13 01/09/2014   Pneumococcal Polysaccharide-23 04/20/2006, 09/04/2017   Respiratory Syncytial Virus Vaccine,Recomb Aduvanted(Arexvy) 11/29/2022   Td 04/20/2006   Zoster Recombinant(Shingrix) 08/02/2021, 10/24/2021    Screening Tests Health Maintenance  Topic Date Due   DTaP/Tdap/Td (2 - Tdap) 04/20/2016   Colonoscopy  05/14/2021   INFLUENZA VACCINE  06/20/2024   MAMMOGRAM  11/01/2024   Medicare Annual Wellness (AWV)  04/01/2025   DEXA SCAN  10/17/2027   Pneumonia Vaccine 61+ Years old  Completed   Zoster Vaccines- Shingrix  Completed   HPV VACCINES  Aged Out   Meningococcal B Vaccine  Aged Out   COVID-19 Vaccine  Discontinued   Hepatitis C Screening  Discontinued    Health Maintenance  Health Maintenance Due  Topic Date Due   DTaP/Tdap/Td (2 - Tdap) 04/20/2016   Colonoscopy  05/14/2021   Health Maintenance Items Addressed:declined health main  Additional Screening:  Vision Screening:  Recommended annual ophthalmology exams for early detection of glaucoma and other disorders of the eye.  Dental Screening: Recommended annual dental exams for proper oral hygiene  Community Resource Referral / Chronic Care Management: CRR required this visit?  No   CCM required this visit?  No   Plan:    I have personally reviewed and noted the following in the patient's chart:   Medical and social history Use of alcohol, tobacco or illicit drugs  Current medications and supplements including opioid  prescriptions. Patient is not currently taking opioid prescriptions. Functional ability and status Nutritional status Physical activity Advanced directives List of other physicians Hospitalizations, surgeries, and ER visits in previous 12 months Vitals Screenings to include cognitive, depression, and falls Referrals and appointments  In addition, I have reviewed and discussed with patient certain preventive protocols, quality metrics, and best practice recommendations. A written personalized care plan for preventive services as well as general preventive health recommendations were provided to patient.   Kristy [name], [place]   04/01/2024   After Visit Summary: (MyChart) Due to this being a telephonic visit, the after visit summary with patients personalized plan was offered to patient via MyChart   Notes: Nothing significant to report at this time.

## 2024-04-01 NOTE — Telephone Encounter (Signed)
 Inbound call from patient, would like to speak to a nurse to see if colonoscopy would be recommended. Patient states she spoke with cardiology and Dr. Rosaline Coma was going to review the results and make the appropriates recommendation. Patient would like to discuss if recommendation has been made.

## 2024-04-01 NOTE — Patient Instructions (Signed)
 Kristy [name] , Thank you for taking time out of your busy schedule to complete your Annual Wellness Visit with me. I enjoyed our conversation and look forward to speaking with you again next year. I, as well as your care team,  appreciate your ongoing commitment to your health goals. Please review the following plan we discussed and let me know if I can assist you in the future. Your Game plan/ To Do List    Referrals: If you haven't heard from the office you've been referred to, please reach out to them at the phone provided.   Follow up Visits: Next Medicare AWV with our clinical staff: 04/02/2025   Have you seen your provider in the last 6 months (3 months if uncontrolled diabetes)? Yes Next Office Visit with your provider: no  Clinician Recommendations:  Aim for 30 minutes of exercise or brisk walking, 6-8 glasses of water, and 5 servings of fruits and vegetables each day.       This is a list of the screening recommended for you and due dates:  Health Maintenance  Topic Date Due   DTaP/Tdap/Td vaccine (2 - Tdap) 04/20/2016   Colon Cancer Screening  05/14/2021   Flu Shot  06/20/2024   Mammogram  11/01/2024   Medicare Annual Wellness Visit  04/01/2025   DEXA scan (bone density measurement)  10/17/2027   Pneumonia Vaccine  Completed   Zoster (Shingles) Vaccine  Completed   HPV Vaccine  Aged Out   Meningitis B Vaccine  Aged Out   COVID-19 Vaccine  Discontinued   Hepatitis C Screening  Discontinued    Advanced directives: (Declined) Advance directive discussed with you today. Even though you declined this today, please call our office should you change your mind, and we can give you the proper paperwork for you to fill out. Advance Care Planning is important because it:  [x]  Makes sure you receive the medical care that is consistent with your values, goals, and preferences  [x]  It provides guidance to your family and loved ones and reduces their decisional burden about whether or  not they are making the right decisions based on your wishes.  Follow the link provided in your after visit summary or read over the paperwork we have mailed to you to help you started getting your Advance Directives in place. If you need assistance in completing these, please reach out to us  so that we can help you!  See attachments for Preventive Care and Fall Prevention Tips.

## 2024-04-01 NOTE — Telephone Encounter (Signed)
 Called to discuss further with patient. She states that about 2 weeks ago she called someone from our office to determine if she could have a colonoscopy done and they told her she would need to see cardiology prior to having it scheduled. Patient is unsure who she spoke with. No documentation from our office to support this contact. She reports that whomever she spoke with advised her they would send a message to the provider to determine if she was still an appropriate candidate given her age. She states that she then called back and spoke with someone else, again unsure of whom she spoke with, but states they told her she would require a stress test prior to being scheduled for procedure. Again no documentation found to support this. Patient last seen in office in January of 2025, and per that note she was do for f/u colonoscopy in 2022. It states that it was recommended for patient to have a follow up colonoscopy and endoscopy at this time. Will route to provider for further review.

## 2024-04-02 ENCOUNTER — Telehealth: Payer: Self-pay | Admitting: *Deleted

## 2024-04-02 NOTE — Telephone Encounter (Signed)
 Called patient, she could not understand why they need a clearance if she was seen back in January. I tried to explain that this is just a telehealth phone call, but before I could finish, she cut me off saying " how many times I have to be seen at come before they clear me,  just hold off for now, this is too much." I said ok. Call ended.

## 2024-04-02 NOTE — Telephone Encounter (Signed)
 Called patient, NA, left message to call our office to schedule telehealth preop clearance appointment.

## 2024-04-02 NOTE — Telephone Encounter (Signed)
   Name: Kristy [name]  DOB: January 25, 1941  MRN: [id number]  Primary Cardiologist: None   Preoperative team, please contact this patient and set up a phone call appointment for further preoperative risk assessment. Please obtain consent and complete medication review. Thank you for your help.  I confirm that guidance regarding antiplatelet and oral anticoagulation therapy has been completed and, if necessary, noted below.  Per office protocol, if patient is without any new symptoms or concerns at the time of their virtual visit, she may hold Aspirin for 5-7 days prior to procedure. Please resume Aspirin as soon as possible postprocedure, at the discretion of the surgeon.   I also confirmed the patient resides in the state of [place] . As per [facility] Medical Board telemedicine laws, the patient must reside in the state in which the provider is licensed.   Jude Norton, NP 04/02/2024, 4:18 PM [facility]

## 2024-04-02 NOTE — Telephone Encounter (Signed)
 [facility] Pre-operative Risk Assessment     Request for surgical clearance:     Endoscopy Procedure  What type of surgery is being performed?     colonoscopy  When is this surgery scheduled?     TBD  What type of clearance is required ?   Cardiac  Practice name and name of physician performing surgery?      [facility]  What is your office phone and fax number?      Phone- [phone]  Fax- [phone]  Anesthesia type (None, local, MAC, general) ?       MAC   Please route your response to Monita Anon, CMA

## 2024-04-03 ENCOUNTER — Telehealth: Payer: Self-pay | Admitting: *Deleted

## 2024-04-03 NOTE — Telephone Encounter (Signed)
 Per [place], [place] and Evern Hocking, RN  - patient needs cardiac clearance, once patient is cleared she may be scheduled for a previsit and colonoscpy.

## 2024-04-03 NOTE — Telephone Encounter (Signed)
Patient returned Pre-op call. 

## 2024-04-03 NOTE — Telephone Encounter (Signed)
 Tried calling patient back no answer left a detailed message to call back

## 2024-04-03 NOTE — Telephone Encounter (Signed)
 Error

## 2024-04-04 NOTE — Telephone Encounter (Signed)
Clearance request sent. ?

## 2024-04-04 NOTE — Telephone Encounter (Signed)
 Tried to call the pt to schedule a tele preop appt. Someone answered the phone but then hung up.

## 2024-04-07 ENCOUNTER — Telehealth: Payer: Self-pay | Admitting: *Deleted

## 2024-04-07 NOTE — Telephone Encounter (Signed)
 Pt has been scheduled tele preop appt 05/12/24. Med rec and consent are done.      Patient Consent for Virtual Visit        Kristy [name] has provided verbal consent on 04/07/2024 for a virtual visit (video or telephone).   CONSENT FOR VIRTUAL VISIT FOR:  Kristy [name]  By participating in this virtual visit I agree to the following:  I hereby voluntarily request, consent and authorize [facility] and its employed or contracted physicians, physician assistants, nurse practitioners or other licensed health care professionals (the Practitioner), to provide me with telemedicine health care services (the "Services") as deemed necessary by the treating Practitioner. I acknowledge and consent to receive the Services by the Practitioner via telemedicine. I understand that the telemedicine visit will involve communicating with the Practitioner through live audiovisual communication technology and the disclosure of certain medical information by electronic transmission. I acknowledge that I have been given the opportunity to request an in-person assessment or other available alternative prior to the telemedicine visit and am voluntarily participating in the telemedicine visit.  I understand that I have the right to withhold or withdraw my consent to the use of telemedicine in the course of my care at any time, without affecting my right to future care or treatment, and that the Practitioner or I may terminate the telemedicine visit at any time. I understand that I have the right to inspect all information obtained and/or recorded in the course of the telemedicine visit and may receive copies of available information for a reasonable fee.  I understand that some of the potential risks of receiving the Services via telemedicine include:  Delay or interruption in medical evaluation due to technological equipment failure or disruption; Information transmitted may not be sufficient (e.g. poor  resolution of images) to allow for appropriate medical decision making by the Practitioner; and/or  In rare instances, security protocols could fail, causing a breach of personal health information.  Furthermore, I acknowledge that it is my responsibility to provide information about my medical history, conditions and care that is complete and accurate to the best of my ability. I acknowledge that Practitioner's advice, recommendations, and/or decision may be based on factors not within their control, such as incomplete or inaccurate data provided by me or distortions of diagnostic images or specimens that may result from electronic transmissions. I understand that the practice of medicine is not an exact science and that Practitioner makes no warranties or guarantees regarding treatment outcomes. I acknowledge that a copy of this consent can be made available to me via my patient portal [facility] MyChart), or I can request a printed copy by calling the office of [facility].    I understand that my insurance will be billed for this visit.   I have read or had this consent read to me. I understand the contents of this consent, which adequately explains the benefits and risks of the Services being provided via telemedicine.  I have been provided ample opportunity to ask questions regarding this consent and the Services and have had my questions answered to my satisfaction. I give my informed consent for the services to be provided through the use of telemedicine in my medical care

## 2024-04-07 NOTE — Telephone Encounter (Signed)
 Pt has been scheduled tele preop appt 05/12/24. Med rec and consent are done.

## 2024-04-08 ENCOUNTER — Encounter (INDEPENDENT_AMBULATORY_CARE_PROVIDER_SITE_OTHER): Payer: Self-pay

## 2024-04-11 ENCOUNTER — Ambulatory Visit: Attending: Cardiology

## 2024-04-11 DIAGNOSIS — Z01810 Encounter for preprocedural cardiovascular examination: Secondary | ICD-10-CM

## 2024-04-11 NOTE — Progress Notes (Signed)
 Virtual Visit via Telephone Note   Because of Kristy [name] co-morbid illnesses, she is at least at moderate risk for complications without adequate follow up.  This format is felt to be most appropriate for this patient at this time.  Due to technical limitations with video connection Web designer), today's appointment will be conducted as an audio only telehealth visit, and Kristy [name] verbally agreed to proceed in this manner.   All issues noted in this document were discussed and addressed.  No physical exam could be performed with this format.  Evaluation Performed:  Preoperative cardiovascular risk assessment _____________   Date:  04/11/2024   Patient ID:  Kristy [name], DOB 1941/01/05, MRN [id number] Patient Location:  Home Provider location:   Office  Primary Care Provider:  Judithann Novas, MD Primary Cardiologist:  None  Chief Complaint / Patient Profile   83 y.o. y/o female with a h/o HFpEF, fibromuscular dysplasia, HTN, HLD who is pending colonoscopy and presents today for telephonic preoperative cardiovascular risk assessment.  History of Present Illness    Kristy [name] is a 83 y.o. female who presents via audio/video conferencing for a telehealth visit today.  Pt was last seen in cardiology clinic on 01/08/2024 by Dr Katheryne Pane.  At that time Kristy [name] was doing well but reported occasional shortness of breath with exertion.  Blood pressure was mildly elevated in office with instructions to monitor no med changes during visit.  The patient is now pending procedure as outlined above. Since her last visit, she has been doing well with no new cardiac complaints.  Reports some elevated heart rate with walking up hills that normalizes on level ground.  She reports also that she has decreased her physical activity and denies any palpitations or dizziness with activity.  She also reports no longer taking ASA 81 mg due to heartburn and nausea and  vomiting.  She denies chest pain, shortness of breath, lower extremity edema, fatigue, palpitations, melena, hematuria, hemoptysis, diaphoresis, weakness, presyncope, syncope, orthopnea, and PND.    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   Constipation    uses stool softener PRN- uses once a week to once every 2 weeks    Fibromuscular dysplasia (HCC)    Gallbladder disease    Hyperlipidemia    Hypertension    For 16 years; urinary catecholamines within normal limits 12/09; renal Doppler ultrasound showed no evidence for renal artery stenosis   Normal echocardiogram 02/2009   LVEF 65%; no regional wall motion abnormalities; normal RV size and function; pulmonic valve had increased gradient across w/ peak gradient of about 36 mmHg (range of moderate pulmonic stenosis) followup showed normal valve   Papilloma of breast    right   Personal history of radiation therapy    2022   Tinnitus of left ear    Tubular adenoma of colon 10/2013   Past Surgical History:  Procedure Laterality Date   ANGIOPLASTY     2012   BREAST BIOPSY Right 2006   benign  BREAST BIOPSY Right 11/09/2020   BREAST BIOPSY Right 11/23/2020   BREAST BIOPSY Right 11/29/2020   x2   BREAST EXCISIONAL BIOPSY Right 07/2017   benign   BREAST LUMPECTOMY Right 12/16/2020   BREAST LUMPECTOMY WITH RADIOACTIVE SEED LOCALIZATION Right 08/14/2017   Procedure: RIGHT BREAST LUMPECTOMY WITH RADIOACTIVE SEED LOCALIZATION;  Surgeon: Sim Dryer, MD;  Location: [facility];  Service: General;  Laterality: Right;   BREAST LUMPECTOMY  WITH RADIOACTIVE SEED LOCALIZATION Right 12/16/2020   Procedure: RADIOACTIVE SEED GUIDED TIMES 3 RIGHT BREAST LUMPECTOMY;  Surgeon: Sim Dryer, MD;  Location: [facility];  Service: General;  Laterality: Right;   Cardiolyte  09/2006   Neg   CHOLECYSTECTOMY N/A 09/22/2021   Procedure: LAPAROSCOPIC CHOLECYSTECTOMY WITH  INTRAOPERATIVE CHOLANGIOGRAM;  Surgeon: Sim Dryer, MD;  Location: [facility];  Service: General;  Laterality: N/A;   COLONOSCOPY     KNEE CARTILAGE SURGERY     right knee   POLYPECTOMY     RE-EXCISION OF BREAST LUMPECTOMY Right 01/11/2021   Procedure: RE-EXCISION OF RIGHT BREAST LUMPECTOMY;  Surgeon: Sim Dryer, MD;  Location: [facility];  Service: General;  Laterality: Right;   TOTAL ABDOMINAL HYSTERECTOMY     no cervix   UPPER GASTROINTESTINAL ENDOSCOPY      Allergies  Allergies  Allergen Reactions   Vibramycin  [Doxycycline ]     Many years ago - "made me feel funny"    Home Medications    Prior to Admission medications   Medication Sig Start Date End Date Taking? Authorizing Provider  aspirin EC 81 MG tablet Take 81 mg by mouth daily. Swallow whole.    [provider]  atorvastatin  (LIPITOR) 20 MG tablet TAKE 1 TABLET BY MOUTH EVERY DAY 06/08/23   Bedsole, Amy E, MD  losartan  (COZAAR ) 50 MG tablet TAKE 1 TABLET BY MOUTH EVERY DAY 06/15/23   Bedsole, Amy E, MD  metoprolol  succinate (TOPROL -XL) 100 MG 24 hr tablet TAKE 1 TABLET BY MOUTH DAILY  WITH OR IMMEDIATELY FOLLOWING A  MEAL 04/01/24   Bedsole, Amy E, MD  Multiple Vitamin (MULTIVITAMIN WITH MINERALS) TABS tablet Take 2 tablets by mouth daily.    [provider]  sodium chloride  (OCEAN) 0.65 % SOLN nasal spray Place 1 spray into both nostrils every 4 (four) hours as needed for congestion.    [provider]  tamoxifen  (NOLVADEX ) 20 MG tablet Take 1 tablet (20 mg total) by mouth daily. 10/01/23   Iruku, Praveena, MD  triamterene -hydrochlorothiazide  (MAXZIDE-25) 37.5-25 MG tablet TAKE 1 TABLET BY MOUTH DAILY 04/01/24   Judithann Novas, MD    Physical Exam    Vital Signs:  Kristy [name] does not have vital signs available for review today. 120/65  Given telephonic nature of communication, physical exam is limited. AAOx3. NAD. Normal affect.  Speech and respirations are unlabored.  Accessory Clinical Findings    None  Assessment & Plan    1.  Preoperative Cardiovascular Risk Assessment: - Patient's RCRI score 0.9%  The patient affirms she has been doing well without any new cardiac symptoms. They are able to achieve 6 METS without cardiac limitations. Therefore, based on ACC/AHA guidelines, the patient would be at acceptable risk for the planned procedure without further cardiovascular testing. The patient was advised that if she develops new symptoms prior to surgery to contact our office to arrange for a follow-up visit, and she verbalized understanding.   The patient was advised that if she develops new  symptoms prior to surgery to contact our office to arrange for a follow-up visit, and she verbalized understanding.  Patient reports that she discontinued ASA 81 mg and therefore no medications required to hold prior to procedure.  A copy of this note will be routed to requesting surgeon.  Time:   Today, I have spent 6 minutes with the patient with telehealth technology discussing medical history, symptoms, and management plan.     Francene Ing, Retha Cast, NP  04/11/2024, 7:11 AM

## 2024-04-15 MED ORDER — NA SULFATE-K SULFATE-MG SULF 17.5-3.13-1.6 GM/177ML PO SOLN: 1.0000 | Freq: Once | ORAL | 0 refills | Status: AC

## 2024-04-15 NOTE — Telephone Encounter (Signed)
 Clearance received and scheduled ECL for 05/22/24 @ 10:30 am. Previsit scheduled for Monday 05/12/24 @ 1 pm. Suprep sent to pharmacy.

## 2024-04-25 ENCOUNTER — Other Ambulatory Visit: Payer: Self-pay

## 2024-05-01 ENCOUNTER — Telehealth: Payer: Self-pay

## 2024-05-01 NOTE — Telephone Encounter (Signed)
 Copied from CRM [id number]. Topic: Clinical - Request for Lab/Test Order >> May 01, 2024 11:38 AM Clyde Darling P wrote: Reason for CRM: pt would like bloodwork as she experiences lightheaded/cramps in feet, think its related to b12

## 2024-05-01 NOTE — Telephone Encounter (Signed)
 Mrs. Irigoyen notified as instructed by telephone.  Office visit scheduled 05/02/2024 at 9:20 am with Dr. Cherlyn Cornet.

## 2024-05-01 NOTE — Telephone Encounter (Signed)
 Copied from CRM [id number]. Topic: Clinical - Request for Lab/Test Order >> May 01, 2024 11:38 AM Kristy [name] wrote: Reason for CRM: pt would like bloodwork as she experiences lightheaded/cramps in feet, think its related to b12

## 2024-05-02 ENCOUNTER — Encounter: Payer: Self-pay | Admitting: Family Medicine

## 2024-05-02 ENCOUNTER — Ambulatory Visit (INDEPENDENT_AMBULATORY_CARE_PROVIDER_SITE_OTHER): Admitting: Family Medicine

## 2024-05-02 VITALS — BP 104/80 | HR 71 | Temp 97.5°F | Ht 62.0 in | Wt 171.1 lb

## 2024-05-02 DIAGNOSIS — E559 Vitamin D deficiency, unspecified: Secondary | ICD-10-CM | POA: Diagnosis not present

## 2024-05-02 DIAGNOSIS — R252 Cramp and spasm: Secondary | ICD-10-CM | POA: Insufficient documentation

## 2024-05-02 DIAGNOSIS — R42 Dizziness and giddiness: Secondary | ICD-10-CM

## 2024-05-02 DIAGNOSIS — R55 Syncope and collapse: Secondary | ICD-10-CM | POA: Diagnosis not present

## 2024-05-02 NOTE — Assessment & Plan Note (Signed)
 Acute as well as body pain.. may be secondary to Tamoxifem side effect or knee issues given worse in right foot Will check labs. Encouraged stretching and increase water intake.

## 2024-05-02 NOTE — Progress Notes (Signed)
 Patient ID: Kristy [name], female    DOB: 1940/12/17, 83 y.o.   MRN: [id number]  This visit was conducted in person.  BP 104/80   Pulse 71   Temp (!) 97.5 F (36.4 C) (Temporal)   Ht 5' 2 (1.575 m)   Wt 171 lb 2 oz (77.6 kg)   SpO2 99%   BMI 31.30 kg/m    CC:  Chief Complaint  Patient presents with   Dizziness   Foot Cramping    Subjective:   HPI: Kristy [name] is a 83 y.o. female presenting on 05/02/2024 for Dizziness and Foot Cramping  She presents with recurrent dizziness... similar episode discussed at 08/2023 OV  Orthostatic nml Labs unremarkable in 08/2023  CT head negative 10/2023  Today she notes with bending over and standing or when getting up in mornings.. feel lightheaded.. feels off balance, brain fog.  No room spinning  Lasts few seconds. Has intermittent bouts of diarrhea chronically since gallbladder removed  No med changes.  No blood loss.   Leg cramps and hands numbness intermittently... has noted this since being on tamoxifen .  Hx of labile HTN, fibromuscular dysplasia, carotid artery stenosis    Reviewed Cardiac CT.Aaron Aas CAC 30, 44 percentile  US  renal   US  carotid  09/2023 1-39% on right, none on left  Relevant past medical, surgical, family and social history reviewed and updated as indicated. Interim medical history since our last visit reviewed. Allergies and medications reviewed and updated. Outpatient Medications Prior to Visit  Medication Sig Dispense Refill   atorvastatin  (LIPITOR) 20 MG tablet TAKE 1 TABLET BY MOUTH EVERY DAY 90 tablet 3   losartan  (COZAAR ) 50 MG tablet TAKE 1 TABLET BY MOUTH EVERY DAY 90 tablet 3   metoprolol  succinate (TOPROL -XL) 100 MG 24 hr tablet TAKE 1 TABLET BY MOUTH DAILY  WITH OR IMMEDIATELY FOLLOWING A  MEAL 100 tablet 0   Multiple Vitamin (MULTIVITAMIN WITH MINERALS) TABS tablet Take 2 tablets by mouth daily.     sodium chloride  (OCEAN) 0.65 % SOLN nasal spray Place 1 spray into both nostrils every  4 (four) hours as needed for congestion.     tamoxifen  (NOLVADEX ) 20 MG tablet Take 1 tablet (20 mg total) by mouth daily. 90 tablet 3   triamterene -hydrochlorothiazide (MAXZIDE-25) 37.5-25 MG tablet TAKE 1 TABLET BY MOUTH DAILY 100 tablet 2   No facility-administered medications prior to visit.     Per HPI unless specifically indicated in ROS section below Review of Systems  Constitutional:  Negative for fatigue and fever.  HENT:  Negative for congestion.   Eyes:  Negative for pain.  Respiratory:  Negative for cough and shortness of breath.   Cardiovascular:  Negative for chest pain, palpitations and leg swelling.  Gastrointestinal:  Negative for abdominal pain.  Genitourinary:  Negative for dysuria and vaginal bleeding.  Musculoskeletal:  Negative for back pain.  Neurological:  Negative for syncope, light-headedness and headaches.  Psychiatric/Behavioral:  Negative for dysphoric mood.    Objective:  BP 104/80   Pulse 71   Temp (!) 97.5 F (36.4 C) (Temporal)   Ht 5' 2 (1.575 m)   Wt 171 lb 2 oz (77.6 kg)   SpO2 99%   BMI 31.30 kg/m   Wt Readings from Last 3 Encounters:  05/02/24 171 lb 2 oz (77.6 kg)  04/01/24 170 lb (77.1 kg)  02/15/24 172 lb 9.6 oz (78.3 kg)      Physical Exam Constitutional:  General: She is not in acute distress.    Appearance: Normal appearance. She is well-developed. She is not ill-appearing or toxic-appearing.  HENT:     Head: Normocephalic.     Right Ear: Hearing, tympanic membrane, ear canal and external ear normal. Tympanic membrane is not erythematous, retracted or bulging.     Left Ear: Hearing, tympanic membrane, ear canal and external ear normal. Tympanic membrane is not erythematous, retracted or bulging.     Nose: No mucosal edema or rhinorrhea.     Right Sinus: No maxillary sinus tenderness or frontal sinus tenderness.     Left Sinus: No maxillary sinus tenderness or frontal sinus tenderness.     Mouth/Throat:     Pharynx: Uvula  midline.   Eyes:     General: Lids are normal. Lids are everted, no foreign bodies appreciated.     Conjunctiva/sclera: Conjunctivae normal.     Pupils: Pupils are equal, round, and reactive to light.   Neck:     Thyroid : No thyroid  mass or thyromegaly.     Vascular: No carotid bruit.     Trachea: Trachea normal.   Cardiovascular:     Rate and Rhythm: Normal rate and regular rhythm.     Pulses: Normal pulses.     Heart sounds: Normal heart sounds, S1 normal and S2 normal. No murmur heard.    No friction rub. No gallop.  Pulmonary:     Effort: Pulmonary effort is normal. No tachypnea or respiratory distress.     Breath sounds: Normal breath sounds. No decreased breath sounds, wheezing, rhonchi or rales.  Abdominal:     General: Bowel sounds are normal.     Palpations: Abdomen is soft.     Tenderness: There is no abdominal tenderness.   Musculoskeletal:     Cervical back: Normal range of motion and neck supple.   Skin:    General: Skin is warm and dry.     Findings: No rash.   Neurological:     Mental Status: She is alert.   Psychiatric:        Mood and Affect: Mood is not anxious or depressed.        Speech: Speech normal.        Behavior: Behavior normal. Behavior is cooperative.        Thought Content: Thought content normal.        Judgment: Judgment normal.       Results for orders placed or performed in visit on 12/06/23  ECHOCARDIOGRAM COMPLETE   Collection Time: 12/06/23 10:49 AM  Result Value Ref Range   Area-P 1/2 3.08 cm2   S' Lateral 2.40 cm   Est EF 60 - 65%     Assessment and Plan  Episodic lightheadedness Assessment & Plan: Chronic, intermittent and recurrent. Carotid evaluation in the last 6 months unremarkable CT head in the last 6 months unremarkable. Last labs 6 months ago were in the normal range, will recheck for possible secondary causes with lab work given patient does have recurrent loose stools where she may be losing  vitamins and  electrolytes.  Orthostatics unremarkable in past.   Orders: -     CBC with Differential/Platelet -     Comprehensive metabolic panel with GFR -     Vitamin B12 -     TSH -     VITAMIN D  25 Hydroxy (Vit-D Deficiency, Fractures) -     Iron, TIBC and Ferritin Panel  Foot cramps Assessment & Plan: Acute  as well as body pain.. may be secondary to Tamoxifem side effect or knee issues given worse in right foot Will check labs. Encouraged stretching and increase water intake.  Orders: -     CBC with Differential/Platelet -     Comprehensive metabolic panel with GFR -     Vitamin B12 -     TSH -     VITAMIN D  25 Hydroxy (Vit-D Deficiency, Fractures) -     Iron, TIBC and Ferritin Panel    No follow-ups on file.   Herby Lolling, MD

## 2024-05-02 NOTE — Patient Instructions (Addendum)
 Stop ASA.Aaron Aas given upseting stomach.  Please stop at the lab to have labs drawn.

## 2024-05-02 NOTE — Assessment & Plan Note (Addendum)
 Chronic, intermittent and recurrent. Carotid evaluation in the last 6 months unremarkable CT head in the last 6 months unremarkable. Last labs 6 months ago were in the normal range, will recheck for possible secondary causes with lab work given patient does have recurrent loose stools where she may be losing  vitamins and electrolytes.  Orthostatics unremarkable in past.

## 2024-05-03 LAB — CBC WITH DIFFERENTIAL/PLATELET
Absolute Lymphocytes: 2778 {cells}/uL (ref 850–3900)
Absolute Monocytes: 456 {cells}/uL (ref 200–950)
Basophils Absolute: 48 {cells}/uL (ref 0–200)
Basophils Relative: 0.8 %
Eosinophils Absolute: 108 {cells}/uL (ref 15–500)
Eosinophils Relative: 1.8 %
HCT: 38.5 % (ref 35.0–45.0)
Hemoglobin: 12.6 g/dL (ref 11.7–15.5)
MCH: 32.4 pg (ref 27.0–33.0)
MCHC: 32.7 g/dL (ref 32.0–36.0)
MCV: 99 fL (ref 80.0–100.0)
MPV: 10.9 fL (ref 7.5–12.5)
Monocytes Relative: 7.6 %
Neutro Abs: 2610 {cells}/uL (ref 1500–7800)
Neutrophils Relative %: 43.5 %
Platelets: 301 10*3/uL (ref 140–400)
RBC: 3.89 10*6/uL (ref 3.80–5.10)
RDW: 12.5 % (ref 11.0–15.0)
Total Lymphocyte: 46.3 %
WBC: 6 10*3/uL (ref 3.8–10.8)

## 2024-05-03 LAB — IRON,TIBC AND FERRITIN PANEL
%SAT: 34 % (ref 16–45)
Ferritin: 155 ng/mL (ref 16–288)
Iron: 108 ug/dL (ref 45–160)
TIBC: 318 ug/dL (ref 250–450)

## 2024-05-03 LAB — COMPREHENSIVE METABOLIC PANEL WITH GFR
AG Ratio: 1.6 (calc) (ref 1.0–2.5)
ALT: 15 U/L (ref 6–29)
AST: 17 U/L (ref 10–35)
Albumin: 4.2 g/dL (ref 3.6–5.1)
Alkaline phosphatase (APISO): 49 U/L (ref 37–153)
BUN: 13 mg/dL (ref 7–25)
CO2: 27 mmol/L (ref 20–32)
Calcium: 10 mg/dL (ref 8.6–10.4)
Chloride: 103 mmol/L (ref 98–110)
Creat: 0.85 mg/dL (ref 0.60–0.95)
Globulin: 2.7 g/dL (ref 1.9–3.7)
Glucose, Bld: 108 mg/dL — ABNORMAL HIGH (ref 65–99)
Potassium: 3.8 mmol/L (ref 3.5–5.3)
Sodium: 140 mmol/L (ref 135–146)
Total Bilirubin: 0.4 mg/dL (ref 0.2–1.2)
Total Protein: 6.9 g/dL (ref 6.1–8.1)
eGFR: 69 mL/min/{1.73_m2} (ref >=60)

## 2024-05-03 LAB — VITAMIN D 25 HYDROXY (VIT D DEFICIENCY, FRACTURES): Vit D, 25-Hydroxy: 49 ng/mL (ref 30–100)

## 2024-05-03 LAB — TSH: TSH: 2.17 m[IU]/L (ref 0.40–4.50)

## 2024-05-03 LAB — VITAMIN B12: Vitamin B-12: 359 pg/mL (ref 200–1100)

## 2024-05-08 ENCOUNTER — Telehealth (HOSPITAL_BASED_OUTPATIENT_CLINIC_OR_DEPARTMENT_OTHER): Payer: Self-pay | Admitting: *Deleted

## 2024-05-08 ENCOUNTER — Ambulatory Visit: Payer: Self-pay | Admitting: Family Medicine

## 2024-05-08 DIAGNOSIS — M1711 Unilateral primary osteoarthritis, right knee: Secondary | ICD-10-CM | POA: Diagnosis not present

## 2024-05-08 NOTE — Telephone Encounter (Signed)
   Pre-operative Risk Assessment    Patient Name: Kristy [name]  DOB: 12-12-1940 MRN: [id number]   Date of last office visit: 01/08/2024 Date of next office visit: None  Request for Surgical Clearance    Procedure:  Right Total Knee Arthroplasty  Date of Surgery:  Clearance TBD                                 Surgeon:  Dr. Priscille Brought Surgeon's Group or Practice Name:  [facility] Phone number:  [phone] Fax number:  [phone]   Type of Clearance Requested:   - Medical    Type of Anesthesia:  Spinal   Additional requests/questions:    Signed, Lauris Port   05/08/2024, 2:37 PM

## 2024-05-08 NOTE — Telephone Encounter (Signed)
     Primary Cardiologist: None  Chart reviewed as part of pre-operative protocol coverage. Given past medical history and time since last visit, based on ACC/AHA guidelines, Kristy [name] would be at acceptable risk for the planned procedure without further cardiovascular testing.   Her RCRI is low risk, 0.9% risk of major cardiac event.  I will route this recommendation to the requesting party via Epic fax function and remove from pre-op pool.  Please call with questions.  Chet Cota. Aundre Hietala NP-C     05/08/2024, 3:22 PM [facility] [address] Office [phone] Fax [phone]

## 2024-05-09 ENCOUNTER — Telehealth: Payer: Self-pay

## 2024-05-09 NOTE — Telephone Encounter (Signed)
 Received pre op from from [employer]. Patient was seen 05/02/24 with labs. Last EKG 11/11/24. Do we need to call and set up visit for clearance? Form placed in your box

## 2024-05-12 ENCOUNTER — Ambulatory Visit (AMBULATORY_SURGERY_CENTER)

## 2024-05-12 ENCOUNTER — Encounter: Payer: Self-pay | Admitting: Internal Medicine

## 2024-05-12 VITALS — Ht 62.0 in | Wt 170.0 lb

## 2024-05-12 DIAGNOSIS — Z860100 Personal history of colon polyps, unspecified: Secondary | ICD-10-CM

## 2024-05-12 MED ORDER — NA SULFATE-K SULFATE-MG SULF 17.5-3.13-1.6 GM/177ML PO SOLN: 1.0000 | Freq: Once | ORAL | 0 refills | Status: AC

## 2024-05-12 NOTE — Progress Notes (Signed)

## 2024-05-14 NOTE — Telephone Encounter (Signed)
Form completed and in outbox, ready to fax

## 2024-05-14 NOTE — Telephone Encounter (Signed)
 Surgical Clearance from faxed to [facility] at [phone].

## 2024-05-16 ENCOUNTER — Other Ambulatory Visit: Payer: Self-pay | Admitting: Family Medicine

## 2024-05-22 ENCOUNTER — Telehealth: Payer: Self-pay

## 2024-05-22 ENCOUNTER — Telehealth: Payer: Self-pay | Admitting: *Deleted

## 2024-05-22 ENCOUNTER — Encounter: Admitting: Internal Medicine

## 2024-05-22 DIAGNOSIS — R1111 Vomiting without nausea: Secondary | ICD-10-CM

## 2024-05-22 DIAGNOSIS — R1310 Dysphagia, unspecified: Secondary | ICD-10-CM

## 2024-05-22 DIAGNOSIS — R1115 Cyclical vomiting syndrome unrelated to migraine: Secondary | ICD-10-CM

## 2024-05-22 NOTE — Telephone Encounter (Signed)
 Pt called stating that she vomited after taking the second half of the prep. Last BM was liquid and brown, with noticeable sediment in stool. Spoke with MD, and advised pt that she could drink miralax and gatorade and her procedure could be done this afternoon. Pt stated that she could not go through the prep again, and my stomach wouldn't tolerate the prep. She did not reschedule colonoscopy. Wants to have an endoscopy, but will speak with PCP before scheduling the procedure. Gave pt [facility] numbers to call to reschedule. PT verbalized understanding and had no further concerns at the end of the call.

## 2024-05-22 NOTE — Telephone Encounter (Signed)
 Spoke with Kristy [name].  She is needing a GI referral to Dr. Federico at [facility] for possible endoscopy because she keeps throwing up even after having her gallbladder removed and she states she is not attempting the colonoscopy prep again.

## 2024-05-22 NOTE — Telephone Encounter (Signed)
 Copied from CRM [id number]. Topic: Clinical - Request for Lab/Test Order >> May 22, 2024  8:27 AM Mercedes MATSU wrote: Reason for CRM: Patient called in to request a referral from Dr. Avelina for an endoscopy. Patient was supposed to get a colonoscopy but she vomited all the solution up before the procedure and they recommended she get a endoscopy. Patient is requesting a call back and can be reached at [phone].

## 2024-05-24 ENCOUNTER — Other Ambulatory Visit: Payer: Self-pay | Admitting: Family Medicine

## 2024-05-26 ENCOUNTER — Telehealth: Payer: Self-pay | Admitting: *Deleted

## 2024-05-26 ENCOUNTER — Other Ambulatory Visit: Payer: Self-pay | Admitting: Family Medicine

## 2024-05-26 DIAGNOSIS — R7303 Prediabetes: Secondary | ICD-10-CM

## 2024-05-26 DIAGNOSIS — E782 Mixed hyperlipidemia: Secondary | ICD-10-CM

## 2024-05-26 NOTE — Telephone Encounter (Signed)
Please schedule CPE with fasting labs prior with Dr. Bedsole.  

## 2024-05-26 NOTE — Telephone Encounter (Signed)
 Copied from CRM [id number]. Topic: Appointments - Scheduling Inquiry for Clinic >> May 26, 2024 11:04 AM Dashun Borre BRAVO wrote: Reason for CRM: patient calling asking for advice about labs that are schedule for Dr Avelina and for knee surgery, does she need to postpone appt with  Dr Avelina  before knee surgery for AWV    Patient would like to speak with Arland about this [phone]

## 2024-05-26 NOTE — Telephone Encounter (Signed)
 Spoke with Ms. Spells.  She is asking if she comes in for labs for Dr. Avelina does she still need to have her Pre-Op labs done at the hospital on 06/18/2024 for her upcoming knee surgery.   I let her know she would have to reach out to her surgeon to let them know she has recent labs with her PCP and see if they still need her to do Pre-Op labs on 06/18/24.

## 2024-05-27 ENCOUNTER — Telehealth: Payer: Self-pay | Admitting: *Deleted

## 2024-05-27 NOTE — Telephone Encounter (Signed)
 This RN spoke with pt per call stating she will be proceeding with knee replacement on 06/30/2024.  She is asking when to stop the tamoxifen .  This RN informed her best to stop 2 weeks before with given date of July 28.  She is to not resume until she is fully weight bearing and back to her activities of daily living which with expected hold of tamoxifen  for at least 6 weeks - with some patients needing to stay off longer.  Pt verbalized understanding and appreciation.

## 2024-05-29 ENCOUNTER — Ambulatory Visit: Payer: Self-pay | Admitting: Family Medicine

## 2024-05-29 ENCOUNTER — Other Ambulatory Visit (INDEPENDENT_AMBULATORY_CARE_PROVIDER_SITE_OTHER)

## 2024-05-29 DIAGNOSIS — E782 Mixed hyperlipidemia: Secondary | ICD-10-CM

## 2024-05-29 DIAGNOSIS — R7303 Prediabetes: Secondary | ICD-10-CM | POA: Diagnosis not present

## 2024-05-29 LAB — LIPID PANEL
Cholesterol: 123 mg/dL (ref 0–200)
HDL: 53 mg/dL (ref >39.00)
LDL Cholesterol: 43 mg/dL (ref 0–99)
NonHDL: 70.24
Total CHOL/HDL Ratio: 2
Triglycerides: 137 mg/dL (ref 0.0–149.0)
VLDL: 27.4 mg/dL (ref 0.0–40.0)

## 2024-05-29 LAB — HEMOGLOBIN A1C: Hgb A1c MFr Bld: 6.2 % (ref 4.6–6.5)

## 2024-05-29 NOTE — Progress Notes (Signed)
 No critical labs need to be addressed urgently. We will discuss labs in detail at upcoming office visit.

## 2024-06-05 ENCOUNTER — Encounter: Payer: Self-pay | Admitting: Family Medicine

## 2024-06-05 ENCOUNTER — Ambulatory Visit (INDEPENDENT_AMBULATORY_CARE_PROVIDER_SITE_OTHER): Admitting: Family Medicine

## 2024-06-05 VITALS — BP 110/70 | HR 75 | Temp 98.5°F | Ht 61.5 in | Wt 171.0 lb

## 2024-06-05 DIAGNOSIS — Z0000 Encounter for general adult medical examination without abnormal findings: Secondary | ICD-10-CM

## 2024-06-05 DIAGNOSIS — I10 Essential (primary) hypertension: Secondary | ICD-10-CM | POA: Diagnosis not present

## 2024-06-05 DIAGNOSIS — R7303 Prediabetes: Secondary | ICD-10-CM | POA: Diagnosis not present

## 2024-06-05 DIAGNOSIS — N9089 Other specified noninflammatory disorders of vulva and perineum: Secondary | ICD-10-CM | POA: Insufficient documentation

## 2024-06-05 DIAGNOSIS — I701 Atherosclerosis of renal artery: Secondary | ICD-10-CM

## 2024-06-05 DIAGNOSIS — E782 Mixed hyperlipidemia: Secondary | ICD-10-CM

## 2024-06-05 DIAGNOSIS — I773 Arterial fibromuscular dysplasia: Secondary | ICD-10-CM

## 2024-06-05 DIAGNOSIS — I6523 Occlusion and stenosis of bilateral carotid arteries: Secondary | ICD-10-CM

## 2024-06-05 MED ORDER — VALACYCLOVIR HCL 500 MG PO TABS: 500.0000 mg | ORAL_TABLET | Freq: Two times a day (BID) | ORAL | 2 refills | Status: DC

## 2024-06-05 NOTE — Assessment & Plan Note (Signed)
 Followed by vascular

## 2024-06-05 NOTE — Assessment & Plan Note (Signed)
Followed by vascular yearly. 

## 2024-06-05 NOTE — Progress Notes (Signed)
 Patient ID: Kristy [name], female    DOB: 1941/07/19, 83 y.o.   MRN: [id number]  This visit was conducted in person.  BP 110/70   Pulse 75   Temp 98.5 F (36.9 C) (Temporal)   Ht 5' 1.5 (1.562 m)   Wt 171 lb (77.6 kg)   SpO2 97%   BMI 31.79 kg/m    CC:  Chief Complaint  Patient presents with   Annual Exam    Part 2-M[facility] 04/01/2024    Subjective:   HPI: Kristy [name] is a 83 y.o. female presenting on 06/05/2024 for Annual Exam (Part 2-[facility]WV 04/01/2024)  The patient presents for  complete physical and review of chronic health problems. He/She also has the following acute concerns today:  none Plans 07/01/2024 knee replacement.  The patient saw a LPN or RN for medicare wellness visit. 04/01/2024  Prevention and wellness was reviewed in detail. Note reviewed and important notes copied below.  Wt Readings from Last 3 Encounters:  06/05/24 171 lb (77.6 kg)  05/12/24 170 lb (77.1 kg)  05/02/24 171 lb 2 oz (77.6 kg)    Prediabetes  stable control. Lab Results  Component Value Date   HGBA1C 6.2 05/29/2024    Hypertension:   Well controlled on metoprolol , losartan  50 mg daily and maxide  BP Readings from Last 3 Encounters:  06/05/24 110/70  05/02/24 104/80  04/01/24 134/65  Using medication without problems or lightheadedness:  none Chest pain with exertion: none Edema: none Short of breath: none Average home BPs: Other issues:   Elevated Cholesterol:  LDL at goal < 70 on  atorvastatin  20 mg daily Lab Results  Component Value Date   CHOL 123 05/29/2024   HDL 53.00 05/29/2024   LDLCALC 43 05/29/2024   LDLDIRECT 140.3 03/26/2013   TRIG 137.0 05/29/2024   CHOLHDL 2 05/29/2024  Using medications without problems: none Muscle aches:  none Diet compliance: good Exercise: walking limited given knee Other complaints:   Fibromuscular dysplasia/Renal artery stenosis/carotid artery stenosis followed by vascular.  Hx of breast cancer, active treatment on  tamoxifem, followed by oncology Dr. Denver     Able to do about  1 , possibly 2 flights of stair with minimal SOB.  Relevant past medical, surgical, family and social history reviewed and updated as indicated. Interim medical history since our last visit reviewed. Allergies and medications reviewed and updated. Outpatient Medications Prior to Visit  Medication Sig Dispense Refill   atorvastatin  (LIPITOR) 20 MG tablet TAKE 1 TABLET BY MOUTH EVERY DAY 90 tablet 3   losartan  (COZAAR ) 50 MG tablet TAKE 1 TABLET BY MOUTH EVERY DAY 90 tablet 0   metoprolol  succinate (TOPROL -XL) 100 MG 24 hr tablet TAKE 1 TABLET BY MOUTH DAILY  WITH OR IMMEDIATELY FOLLOWING A  MEAL 100 tablet 0   Multiple Vitamin (MULTIVITAMIN WITH MINERALS) TABS tablet Take 2 tablets by mouth daily.     sodium chloride  (OCEAN) 0.65 % SOLN nasal spray Place 1 spray into both nostrils every 4 (four) hours as needed for congestion.     tamoxifen  (NOLVADEX ) 20 MG tablet Take 1 tablet (20 mg total) by mouth daily. 90 tablet 3   triamterene -hydrochlorothiazide (MAXZIDE-25) 37.5-25 MG tablet TAKE 1 TABLET BY MOUTH DAILY 100 tablet 2   No facility-administered medications prior to visit.     Per HPI unless specifically indicated in ROS section below Review of Systems  Constitutional:  Negative for fatigue and fever.  HENT:  Negative for congestion.  Eyes:  Negative for pain.  Respiratory:  Negative for cough and shortness of breath.   Cardiovascular:  Negative for chest pain, palpitations and leg swelling.  Gastrointestinal:  Negative for abdominal pain.  Genitourinary:  Negative for dysuria and vaginal bleeding.  Musculoskeletal:  Negative for back pain.  Neurological:  Negative for syncope, light-headedness and headaches.  Psychiatric/Behavioral:  Negative for dysphoric mood.    Objective:  BP 110/70   Pulse 75   Temp 98.5 F (36.9 C) (Temporal)   Ht 5' 1.5 (1.562 m)   Wt 171 lb (77.6 kg)   SpO2 97%   BMI 31.79 kg/m    Wt Readings from Last 3 Encounters:  06/05/24 171 lb (77.6 kg)  05/12/24 170 lb (77.1 kg)  05/02/24 171 lb 2 oz (77.6 kg)      Physical Exam Vitals and nursing note reviewed.  Constitutional:      General: She is not in acute distress.    Appearance: Normal appearance. She is well-developed. She is not ill-appearing or toxic-appearing.  HENT:     Head: Normocephalic.     Right Ear: Hearing, tympanic membrane, ear canal and external ear normal.     Left Ear: Hearing, tympanic membrane, ear canal and external ear normal.     Nose: Nose normal.  Eyes:     General: Lids are normal. Lids are everted, no foreign bodies appreciated.     Conjunctiva/sclera: Conjunctivae normal.     Pupils: Pupils are equal, round, and reactive to light.  Neck:     Thyroid : No thyroid  mass or thyromegaly.     Vascular: No carotid bruit.     Trachea: Trachea normal.  Cardiovascular:     Rate and Rhythm: Normal rate and regular rhythm.     Heart sounds: Normal heart sounds, S1 normal and S2 normal. No murmur heard.    No gallop.  Pulmonary:     Effort: Pulmonary effort is normal. No respiratory distress.     Breath sounds: Normal breath sounds. No wheezing, rhonchi or rales.  Abdominal:     General: Bowel sounds are normal. There is no distension or abdominal bruit.     Palpations: Abdomen is soft. There is no fluid wave or mass.     Tenderness: There is no abdominal tenderness. There is no guarding or rebound.     Hernia: No hernia is present.  Genitourinary:     Comments: Lesion on rightt labia consistent with nonirritated subcutaneous sebaceous cyst 3 mm lesion on left consistent with infected hair follicle, no central fluctuance or surrounding erythema. Musculoskeletal:     Cervical back: Normal range of motion and neck supple.  Lymphadenopathy:     Cervical: No cervical adenopathy.  Skin:    General: Skin is warm and dry.     Findings: No rash.  Neurological:     Mental Status: She is  alert.     Cranial Nerves: No cranial nerve deficit.     Sensory: No sensory deficit.  Psychiatric:        Mood and Affect: Mood is not anxious or depressed.        Speech: Speech normal.        Behavior: Behavior normal. Behavior is cooperative.        Judgment: Judgment normal.       Results for orders placed or performed in visit on 05/29/24  Hemoglobin A1c   Collection Time: 05/29/24 11:03 AM  Result Value Ref Range   Hgb A1c  MFr Bld 6.2 4.6 - 6.5 %  Lipid panel   Collection Time: 05/29/24 11:03 AM  Result Value Ref Range   Cholesterol 123 0 - 200 mg/dL   Triglycerides 862.9 0.0 - 149.0 mg/dL   HDL 46.99 >60.99 mg/dL   VLDL 72.5 0.0 - 59.9 mg/dL   LDL Cholesterol 43 0 - 99 mg/dL   Total CHOL/HDL Ratio 2    NonHDL 70.24      COVID 19 screen:  No recent travel or known exposure to COVID19 The patient denies respiratory symptoms of COVID 19 at this time. The importance of social distancing was discussed today.   Assessment and Plan   The patient's preventative maintenance and recommended screening tests for an annual wellness exam were reviewed in full today. Brought up to date unless services declined.  Counselled on the importance of diet, exercise, and its role in overall health and mortality. The patient's FH and SH was reviewed, including their home life, tobacco status, and drug and alcohol status.   Vaccines: up to date, COVID x 3, refused td and  uptodate with shingrix  Colon:04/2019, Dr. Aneita,  Repeat in 2 years given precancer polyps..   was scheduled but threw up the prep. . Plans to reschedule at a later time. DVE/pap: total hysterectomy   Mammo:  followed by oncology for breast cancer  10/2023 DEXA: 09/2022 osteoporosis on tamoxifen    Problem List Items Addressed This Visit     Carotid artery stenosis    Followed by vascular yearly.      Fibromuscular dysplasia (HCC)    Followed by vascular.      Hyperlipidemia   Stable, chronic.  Continue  current medication.   Atorvastatin  20 mg daily      HYPERTENSION, BENIGN ESSENTIAL, LABILE   Hx of FMD/renal artery stenosis  Stable, chronic.  Continue current medication.  Losartan  50 mg daily  Maxide 37.5/25 mg  po daily  Metoprolol  XL  100 mg daily      Labial lesion   Acute, appears most consistent with small infected hair follicle.  Did swab area given patient concern for HSV. Recommended warm compress and topical antibiotic ointment. 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      Relevant Orders   Herpes simplex virus culture   Prediabetes   Chronic, improving with diet changes.      Renal artery stenosis (HCC)    Followed by vascular yearly.      Other Visit Diagnoses       Routine general medical examination at a health care facility    -  Primary          Greig Ring, MD

## 2024-06-05 NOTE — Assessment & Plan Note (Signed)
 Acute, appears most consistent with small infected hair follicle.  Did swab area given patient concern for HSV. Recommended warm compress and topical antibiotic ointment. 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

## 2024-06-05 NOTE — Assessment & Plan Note (Signed)
Stable, chronic.  Continue current medication. ? ? ?Atorvastatin 20 mg daily ?

## 2024-06-05 NOTE — Assessment & Plan Note (Signed)
Hx of FMD/renal artery stenosis  Stable, chronic.  Continue current medication.  Losartan 50 mg daily  Maxide 37.5/25 mg  po daily  Metoprolol XL  100 mg daily

## 2024-06-05 NOTE — Assessment & Plan Note (Signed)
Chronic, improving with diet changes. 

## 2024-06-06 ENCOUNTER — Ambulatory Visit: Admitting: Podiatry

## 2024-06-06 ENCOUNTER — Encounter: Payer: Self-pay | Admitting: Podiatry

## 2024-06-06 ENCOUNTER — Ambulatory Visit (INDEPENDENT_AMBULATORY_CARE_PROVIDER_SITE_OTHER)

## 2024-06-06 DIAGNOSIS — M7751 Other enthesopathy of right foot: Secondary | ICD-10-CM | POA: Diagnosis not present

## 2024-06-06 DIAGNOSIS — M65971 Unspecified synovitis and tenosynovitis, right ankle and foot: Secondary | ICD-10-CM | POA: Diagnosis not present

## 2024-06-06 DIAGNOSIS — M205X1 Other deformities of toe(s) (acquired), right foot: Secondary | ICD-10-CM

## 2024-06-06 DIAGNOSIS — S90111A Contusion of right great toe without damage to nail, initial encounter: Secondary | ICD-10-CM | POA: Diagnosis not present

## 2024-06-06 DIAGNOSIS — M659 Unspecified synovitis and tenosynovitis, unspecified site: Secondary | ICD-10-CM

## 2024-06-06 MED ORDER — TRIAMCINOLONE ACETONIDE 10 MG/ML IJ SUSP
10.0000 mg | Freq: Once | INTRAMUSCULAR | Status: AC
Start: 2024-06-06 — End: 2024-06-06
  Administered 2024-06-06: 10 mg via INTRA_ARTICULAR

## 2024-06-09 ENCOUNTER — Other Ambulatory Visit: Payer: Self-pay | Admitting: Family Medicine

## 2024-06-09 NOTE — Progress Notes (Signed)
 Subjective:   Patient ID: Kristy [name], female   DOB: 83 y.o.   MRN: [id number]   HPI Patient presents stating that the right big toe joint has been very sore for a couple months and she does think she traumatized it.  States it is hard to walk on and patient has not been seen in over 5 years.  Patient states it also feels stiff and it has been that way for a while does not smoke likes to be active   Review of Systems  All other systems reviewed and are negative.       Objective:  Physical Exam Vitals and nursing note reviewed.  Constitutional:      Appearance: She is well-developed.  Pulmonary:     Effort: Pulmonary effort is normal.  Musculoskeletal:        General: Normal range of motion.  Skin:    General: Skin is warm.  Neurological:     Mental Status: She is alert.     Neurovascular status intact muscle strength was found to be adequate range of motion within normal limits.  Patient is found to have exquisite discomfort of the first MPJ right fluid buildup around the joint surface with inflamed synovium.  Patient has good digital perfusion well-oriented     Assessment:  Inflammatory synovitis of the first MPJ right fluid buildup reduction of motion with consistency for hallux limitus rigidus condition     Plan:  H&P x-ray taken reviewed and I went ahead today sterile prep and injected periarticular around the joint 3 mg dexamethasone  Kenalog  5 mg Xylocaine  I have advised on rigid bottom shoes and reappoint for us  to recheck  X-rays indicate spurring no indication of stress fracture or arthritis with narrowing of the joint surface consistent with hallux limitus

## 2024-06-10 LAB — HERPES SIMPLEX VIRUS CULTURE
MICRO NUMBER:: 16712279
SPECIMEN QUALITY:: ADEQUATE

## 2024-06-11 NOTE — Progress Notes (Signed)
 Sent message, via epic in basket, requesting orders in epic from Careers adviser.

## 2024-06-12 ENCOUNTER — Ambulatory Visit: Payer: Self-pay | Admitting: Family Medicine

## 2024-06-12 DIAGNOSIS — M1711 Unilateral primary osteoarthritis, right knee: Secondary | ICD-10-CM | POA: Diagnosis not present

## 2024-06-13 NOTE — Progress Notes (Addendum)
 Anesthesia Review:  PCP: Amy Bedsole- LVO 06/05/24  Cardiologist : Ethel Lesches LOV 01/08/24  Kristy [name] clearance 05/08/24   PPM/ ICD: Device Orders: Rep Notified:  Chest x-ray : EKG : 11/12/23  CT Card- 02/22/24  Echo : 12/06/23  Stress test: Cardiac Cath :   Activity level: can do a flight of stairs without difficutly  Sleep Study/ CPAP : none  Fasting Blood Sugar :      / Checks Blood Sugar -- times a day:     05/29/24-hgba1c- 6.2   Blood Thinner/ Instructions /Last Dose: ASA / Instructions/ Last Dose :

## 2024-06-16 ENCOUNTER — Ambulatory Visit: Payer: Self-pay | Admitting: Emergency Medicine

## 2024-06-16 DIAGNOSIS — G8929 Other chronic pain: Secondary | ICD-10-CM

## 2024-06-16 NOTE — H&P (View-Only) (Signed)
 TOTAL KNEE ADMISSION H&P  Patient is being admitted for right total knee arthroplasty.  Subjective:  Chief Complaint:right knee pain.  HPI: Kristy [name], 83 y.o. female, has a history of pain and functional disability in the right knee due to arthritis and has failed non-surgical conservative treatments for greater than 12 weeks to includeNSAID's and/or analgesics, corticosteriod injections, viscosupplementation injections, use of assistive devices, and activity modification.  Onset of symptoms was gradual, starting 2 years ago with gradually worsening course since that time. The patient noted prior procedures on the knee to include  arthroscopy on the right knee(s).  Patient currently rates pain in the right knee(s) at 8 out of 10 with activity. Patient has night pain, worsening of pain with activity and weight bearing, pain that interferes with activities of daily living, and pain with passive range of motion.  Patient has evidence of periarticular osteophytes and joint space narrowing by imaging studies. There is no active infection.  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   Carotid artery stenosis 02/20/2018   Renal artery stenosis (HCC) 01/29/2017   BPPV (benign paroxysmal positional vertigo) 12/17/2015   DDD (degenerative disc disease), lumbar 06/02/2014   Lumbar radiculitis 04/17/2014   Other microscopic hematuria 12/16/2013   Allergic rhinitis 10/04/2012   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   Constipation    uses stool softener PRN- uses once a week to once every 2 weeks    Fibromuscular dysplasia (HCC)    Gallbladder disease    Hyperlipidemia    Hypertension    For 16 years; urinary catecholamines within normal limits 12/09; renal Doppler ultrasound showed no evidence for renal artery stenosis   Normal echocardiogram 02/2009   LVEF 65%; no regional wall motion abnormalities; normal RV size and function; pulmonic valve had increased gradient across w/ peak gradient of about 36 mmHg (range of moderate pulmonic stenosis) followup showed normal valve   Papilloma of breast    right   Personal history of radiation therapy    2022   Tinnitus of left ear    Tubular adenoma of colon 10/2013    Past Surgical History:  Procedure Laterality Date   ANGIOPLASTY     2012   BREAST BIOPSY Right 2006   benign   BREAST BIOPSY Right 11/09/2020   BREAST BIOPSY Right 11/23/2020   BREAST BIOPSY Right 11/29/2020   x2   BREAST EXCISIONAL BIOPSY Right 07/2017   benign   BREAST LUMPECTOMY Right 12/16/2020   BREAST  LUMPECTOMY WITH RADIOACTIVE SEED LOCALIZATION Right 08/14/2017   Procedure: RIGHT BREAST LUMPECTOMY WITH RADIOACTIVE SEED LOCALIZATION;  Surgeon: Vanderbilt Ned, MD;  Location: [facility];  Service: General;  Laterality: Right;   BREAST LUMPECTOMY WITH RADIOACTIVE SEED LOCALIZATION Right 12/16/2020   Procedure: RADIOACTIVE SEED GUIDED TIMES 3 RIGHT BREAST LUMPECTOMY;  Surgeon: Vanderbilt Ned, MD;  Location: [facility];  Service: General;  Laterality: Right;   Cardiolyte  09/2006   Neg   CHOLECYSTECTOMY N/A 09/22/2021   Procedure: LAPAROSCOPIC CHOLECYSTECTOMY WITH  INTRAOPERATIVE CHOLANGIOGRAM;  Surgeon: Vanderbilt Ned, MD;  Location: [facility] OR;  Service: General;  Laterality: N/A;   COLONOSCOPY     KNEE CARTILAGE SURGERY     right knee   POLYPECTOMY     RE-EXCISION OF BREAST LUMPECTOMY Right 01/11/2021   Procedure: RE-EXCISION OF RIGHT BREAST LUMPECTOMY;  Surgeon: Vanderbilt Ned, MD;  Location:  SURGERY CENTER;  Service: General;  Laterality: Right;   TOTAL ABDOMINAL HYSTERECTOMY     no cervix   UPPER GASTROINTESTINAL ENDOSCOPY      Current Outpatient Medications  Medication Sig Dispense Refill Last Dose/Taking   atorvastatin  (LIPITOR) 20 MG tablet TAKE 1 TABLET BY MOUTH EVERY DAY 90 tablet 3    losartan  (COZAAR ) 50 MG tablet TAKE 1 TABLET BY MOUTH EVERY DAY 90 tablet 0    metoprolol  succinate (TOPROL -XL) 100 MG 24 hr tablet TAKE 1 TABLET BY MOUTH DAILY  WITH OR IMMEDIATELY FOLLOWING A  MEAL 100 tablet 3    Multiple Vitamin (MULTIVITAMIN WITH MINERALS) TABS tablet Take 2 tablets by mouth daily.      sodium chloride  (OCEAN) 0.65 % SOLN nasal spray Place 1 spray into both nostrils every 4 (four) hours as needed for congestion.      tamoxifen  (NOLVADEX ) 20 MG tablet Take 1 tablet (20 mg total) by mouth daily. 90 tablet 3    triamterene -hydrochlorothiazide (MAXZIDE-25) 37.5-25 MG tablet TAKE 1 TABLET BY MOUTH DAILY 100 tablet 2    valACYclovir  (VALTREX ) 500 MG tablet Take 1 tablet (500 mg total) by mouth 2 (two) times daily. 30 tablet 2    No current facility-administered medications for this visit.   Allergies  Allergen Reactions   Vibramycin  [Doxycycline ]     Many years ago - made me feel funny    Social History   Tobacco Use   Smoking status: Former    Current packs/day: 0.00    Average packs/day: 1 pack/day for 15.0 years (15.0 ttl  pk-yrs)    Types: Cigarettes    Start date: 11/21/1967    Quit date: 11/20/1982    Years since quitting: 41.6   Smokeless tobacco: Never   Tobacco comments:    15 pack year history  Substance Use Topics   Alcohol use: No    Family History  Problem Relation Age of Onset   Prostate cancer Father    Lung cancer Father    Colon polyps Sister    Colon cancer Maternal Uncle    Colon cancer Paternal Uncle    Coronary artery disease Paternal Grandmother    Goiter Other        ?   Esophageal cancer Neg Hx    Stomach cancer Neg Hx    Rectal cancer Neg Hx    Bladder Cancer Neg Hx    Kidney cancer Neg Hx    BRCA 1/2 Neg Hx    Breast cancer Neg Hx      Review of Systems  Musculoskeletal:  Positive for arthralgias.  All other systems reviewed and are negative.   Objective:  Physical Exam Constitutional:      General: She is not in acute distress.    Appearance: Normal appearance. She is not ill-appearing.  HENT:     Head: Normocephalic and atraumatic.     Right Ear: External ear normal.     Left Ear: External ear normal.     Nose: Nose normal.     Mouth/Throat:     Mouth: Mucous membranes are moist.     Pharynx: Oropharynx is clear.  Eyes:     Extraocular Movements: Extraocular movements intact.     Conjunctiva/sclera: Conjunctivae normal.  Cardiovascular:     Rate and Rhythm: Normal rate and regular rhythm.     Pulses: Normal pulses.     Heart sounds: Normal heart sounds.  Pulmonary:     Effort: Pulmonary effort is normal.     Breath sounds: Normal breath sounds.  Abdominal:     General: Bowel sounds are normal.     Palpations: Abdomen is soft.     Tenderness: There is no abdominal tenderness.  Musculoskeletal:        General: Tenderness present.     Cervical back: Normal range of motion and neck supple.     Comments: TTP over medial and lateral joint line, lateral worse than medial.  No calf tenderness, swelling, or erythema.  No overlying lesions of area of chief  complaint.  Decreased strength and ROM due to elicited pain.  Pre-operative ROM 5-95.  Dorsiflexion and plantarflexion intact.  Stable to varus and valgus stress.  BLE appear grossly neurovascularly intact.  Gait antalgic.   Skin:    General: Skin is warm and dry.  Neurological:     Mental Status: She is alert and oriented to person, place, and time. Mental status is at baseline.  Psychiatric:        Mood and Affect: Mood normal.        Behavior: Behavior normal.     Vital signs in last 24 hours: @VSRANGES @  Labs:   Estimated body mass index is 31.79 kg/m as calculated from the following:   Height as of 06/05/24: 5' 1.5 (1.562 m).   Weight as of 06/05/24: 77.6 kg.   Imaging Review Plain radiographs demonstrate severe degenerative joint disease of the right knee(s). The overall alignment isnotable valgus. The bone quality appears to be fair for age and reported activity level.      Assessment/Plan:  End stage arthritis, right knee   The patient history, physical examination, clinical judgment of the provider and imaging studies are consistent with end stage degenerative joint disease of the right knee(s) and total knee arthroplasty is deemed medically necessary. The treatment options including medical management, injection therapy arthroscopy and arthroplasty were discussed at length. The risks and benefits of total knee arthroplasty were presented and reviewed. The risks due to aseptic loosening, infection, stiffness, patella tracking problems, thromboembolic complications and other imponderables were discussed. The patient acknowledged the explanation, agreed to proceed with the plan and consent was signed. Patient is being admitted for inpatient treatment for surgery, pain control, PT, OT, prophylactic antibiotics, VTE prophylaxis, progressive ambulation and ADL's and discharge planning. The patient is planning to be discharged home with outpatient PT    Anticipated LOS equal  to or greater than 2 midnights due to - Age 72 and older with one or more of the following:  - Obesity  - Expected need for  hospital services (PT, OT, Nursing) required for safe  discharge  - Anticipated need for postoperative skilled nursing care or inpatient rehab  - Active co-morbidities: prediabetes, hx of breast cancer 2022, fibromuscular dysplasia with associated carotid stenosis and renal artery stenosis, herpes, diastolic dysfunction, HTN, HLD, GERD OR   - Unanticipated findings during/Post Surgery: None  - Patient is a high risk of re-admission due to: None

## 2024-06-16 NOTE — H&P (Signed)
 TOTAL KNEE ADMISSION H&P  Patient is being admitted for right total knee arthroplasty.  Subjective:  Chief Complaint:right knee pain.  HPI: Kristy [name], 83 y.o. female, has a history of pain and functional disability in the right knee due to arthritis and has failed non-surgical conservative treatments for greater than 12 weeks to includeNSAID's and/or analgesics, corticosteriod injections, viscosupplementation injections, use of assistive devices, and activity modification.  Onset of symptoms was gradual, starting 2 years ago with gradually worsening course since that time. The patient noted prior procedures on the knee to include  arthroscopy on the right knee(s).  Patient currently rates pain in the right knee(s) at 8 out of 10 with activity. Patient has night pain, worsening of pain with activity and weight bearing, pain that interferes with activities of daily living, and pain with passive range of motion.  Patient has evidence of periarticular osteophytes and joint space narrowing by imaging studies. There is no active infection.  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   Carotid artery stenosis 02/20/2018   Renal artery stenosis (HCC) 01/29/2017   BPPV (benign paroxysmal positional vertigo) 12/17/2015   DDD (degenerative disc disease), lumbar 06/02/2014   Lumbar radiculitis 04/17/2014   Other microscopic hematuria 12/16/2013   Allergic rhinitis 10/04/2012   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   Constipation    uses stool softener PRN- uses once a week to once every 2 weeks    Fibromuscular dysplasia (HCC)    Gallbladder disease    Hyperlipidemia    Hypertension    For 16 years; urinary catecholamines within normal limits 12/09; renal Doppler ultrasound showed no evidence for renal artery stenosis   Normal echocardiogram 02/2009   LVEF 65%; no regional wall motion abnormalities; normal RV size and function; pulmonic valve had increased gradient across w/ peak gradient of about 36 mmHg (range of moderate pulmonic stenosis) followup showed normal valve   Papilloma of breast    right   Personal history of radiation therapy    2022   Tinnitus of left ear    Tubular adenoma of colon 10/2013    Past Surgical History:  Procedure Laterality Date   ANGIOPLASTY     2012   BREAST BIOPSY Right 2006   benign   BREAST BIOPSY Right 11/09/2020   BREAST BIOPSY Right 11/23/2020   BREAST BIOPSY Right 11/29/2020   x2   BREAST EXCISIONAL BIOPSY Right 07/2017   benign   BREAST LUMPECTOMY Right 12/16/2020   BREAST  LUMPECTOMY WITH RADIOACTIVE SEED LOCALIZATION Right 08/14/2017   Procedure: RIGHT BREAST LUMPECTOMY WITH RADIOACTIVE SEED LOCALIZATION;  Surgeon: Vanderbilt Ned, MD;  Location: [facility];  Service: General;  Laterality: Right;   BREAST LUMPECTOMY WITH RADIOACTIVE SEED LOCALIZATION Right 12/16/2020   Procedure: RADIOACTIVE SEED GUIDED TIMES 3 RIGHT BREAST LUMPECTOMY;  Surgeon: Vanderbilt Ned, MD;  Location: [facility];  Service: General;  Laterality: Right;   Cardiolyte  09/2006   Neg   CHOLECYSTECTOMY N/A 09/22/2021   Procedure: LAPAROSCOPIC CHOLECYSTECTOMY WITH  INTRAOPERATIVE CHOLANGIOGRAM;  Surgeon: Vanderbilt Ned, MD;  Location: [facility] OR;  Service: General;  Laterality: N/A;   COLONOSCOPY     KNEE CARTILAGE SURGERY     right knee   POLYPECTOMY     RE-EXCISION OF BREAST LUMPECTOMY Right 01/11/2021   Procedure: RE-EXCISION OF RIGHT BREAST LUMPECTOMY;  Surgeon: Vanderbilt Ned, MD;  Location:  SURGERY CENTER;  Service: General;  Laterality: Right;   TOTAL ABDOMINAL HYSTERECTOMY     no cervix   UPPER GASTROINTESTINAL ENDOSCOPY      Current Outpatient Medications  Medication Sig Dispense Refill Last Dose/Taking   atorvastatin  (LIPITOR) 20 MG tablet TAKE 1 TABLET BY MOUTH EVERY DAY 90 tablet 3    losartan  (COZAAR ) 50 MG tablet TAKE 1 TABLET BY MOUTH EVERY DAY 90 tablet 0    metoprolol  succinate (TOPROL -XL) 100 MG 24 hr tablet TAKE 1 TABLET BY MOUTH DAILY  WITH OR IMMEDIATELY FOLLOWING A  MEAL 100 tablet 3    Multiple Vitamin (MULTIVITAMIN WITH MINERALS) TABS tablet Take 2 tablets by mouth daily.      sodium chloride  (OCEAN) 0.65 % SOLN nasal spray Place 1 spray into both nostrils every 4 (four) hours as needed for congestion.      tamoxifen  (NOLVADEX ) 20 MG tablet Take 1 tablet (20 mg total) by mouth daily. 90 tablet 3    triamterene -hydrochlorothiazide (MAXZIDE-25) 37.5-25 MG tablet TAKE 1 TABLET BY MOUTH DAILY 100 tablet 2    valACYclovir  (VALTREX ) 500 MG tablet Take 1 tablet (500 mg total) by mouth 2 (two) times daily. 30 tablet 2    No current facility-administered medications for this visit.   Allergies  Allergen Reactions   Vibramycin  [Doxycycline ]     Many years ago - made me feel funny    Social History   Tobacco Use   Smoking status: Former    Current packs/day: 0.00    Average packs/day: 1 pack/day for 15.0 years (15.0 ttl  pk-yrs)    Types: Cigarettes    Start date: 11/21/1967    Quit date: 11/20/1982    Years since quitting: 41.6   Smokeless tobacco: Never   Tobacco comments:    15 pack year history  Substance Use Topics   Alcohol use: No    Family History  Problem Relation Age of Onset   Prostate cancer Father    Lung cancer Father    Colon polyps Sister    Colon cancer Maternal Uncle    Colon cancer Paternal Uncle    Coronary artery disease Paternal Grandmother    Goiter Other        ?   Esophageal cancer Neg Hx    Stomach cancer Neg Hx    Rectal cancer Neg Hx    Bladder Cancer Neg Hx    Kidney cancer Neg Hx    BRCA 1/2 Neg Hx    Breast cancer Neg Hx      Review of Systems  Musculoskeletal:  Positive for arthralgias.  All other systems reviewed and are negative.   Objective:  Physical Exam Constitutional:      General: She is not in acute distress.    Appearance: Normal appearance. She is not ill-appearing.  HENT:     Head: Normocephalic and atraumatic.     Right Ear: External ear normal.     Left Ear: External ear normal.     Nose: Nose normal.     Mouth/Throat:     Mouth: Mucous membranes are moist.     Pharynx: Oropharynx is clear.  Eyes:     Extraocular Movements: Extraocular movements intact.     Conjunctiva/sclera: Conjunctivae normal.  Cardiovascular:     Rate and Rhythm: Normal rate and regular rhythm.     Pulses: Normal pulses.     Heart sounds: Normal heart sounds.  Pulmonary:     Effort: Pulmonary effort is normal.     Breath sounds: Normal breath sounds.  Abdominal:     General: Bowel sounds are normal.     Palpations: Abdomen is soft.     Tenderness: There is no abdominal tenderness.  Musculoskeletal:        General: Tenderness present.     Cervical back: Normal range of motion and neck supple.     Comments: TTP over medial and lateral joint line, lateral worse than medial.  No calf tenderness, swelling, or erythema.  No overlying lesions of area of chief  complaint.  Decreased strength and ROM due to elicited pain.  Pre-operative ROM 5-95.  Dorsiflexion and plantarflexion intact.  Stable to varus and valgus stress.  BLE appear grossly neurovascularly intact.  Gait antalgic.   Skin:    General: Skin is warm and dry.  Neurological:     Mental Status: She is alert and oriented to person, place, and time. Mental status is at baseline.  Psychiatric:        Mood and Affect: Mood normal.        Behavior: Behavior normal.     Vital signs in last 24 hours: @VSRANGES @  Labs:   Estimated body mass index is 31.79 kg/m as calculated from the following:   Height as of 06/05/24: 5' 1.5 (1.562 m).   Weight as of 06/05/24: 77.6 kg.   Imaging Review Plain radiographs demonstrate severe degenerative joint disease of the right knee(s). The overall alignment isnotable valgus. The bone quality appears to be fair for age and reported activity level.      Assessment/Plan:  End stage arthritis, right knee   The patient history, physical examination, clinical judgment of the provider and imaging studies are consistent with end stage degenerative joint disease of the right knee(s) and total knee arthroplasty is deemed medically necessary. The treatment options including medical management, injection therapy arthroscopy and arthroplasty were discussed at length. The risks and benefits of total knee arthroplasty were presented and reviewed. The risks due to aseptic loosening, infection, stiffness, patella tracking problems, thromboembolic complications and other imponderables were discussed. The patient acknowledged the explanation, agreed to proceed with the plan and consent was signed. Patient is being admitted for inpatient treatment for surgery, pain control, PT, OT, prophylactic antibiotics, VTE prophylaxis, progressive ambulation and ADL's and discharge planning. The patient is planning to be discharged home with outpatient PT    Anticipated LOS equal  to or greater than 2 midnights due to - Age 72 and older with one or more of the following:  - Obesity  - Expected need for  hospital services (PT, OT, Nursing) required for safe  discharge  - Anticipated need for postoperative skilled nursing care or inpatient rehab  - Active co-morbidities: prediabetes, hx of breast cancer 2022, fibromuscular dysplasia with associated carotid stenosis and renal artery stenosis, herpes, diastolic dysfunction, HTN, HLD, GERD OR   - Unanticipated findings during/Post Surgery: None  - Patient is a high risk of re-admission due to: None

## 2024-06-16 NOTE — Progress Notes (Signed)
Second request for pre op orders spoke with: Kelly 

## 2024-06-16 NOTE — Patient Instructions (Signed)
 SURGICAL WAITING ROOM VISITATION  Patients having surgery or a procedure may have no more than 2 support people in the waiting area - these visitors may rotate.    Children under the age of 75 must have an adult with them who is not the patient.  Visitors with respiratory illnesses are discouraged from visiting and should remain at home.  If the patient needs to stay at the hospital during part of their recovery, the visitor guidelines for inpatient rooms apply. Pre-op nurse will coordinate an appropriate time for 1 support person to accompany patient in pre-op.  This support person may not rotate.    Please refer to the [facility] website for the visitor guidelines for Inpatients (after your surgery is over and you are in a regular room).       Your procedure is scheduled on:  06/30/2024    Report to [facility] Main Entrance    Report to admitting at  0800   Call this number if you have problems the morning of surgery [phone]   Do not eat food :After Midnight.   After Midnight you may have the following liquids until __ 0715____ AM  DAY OF SURGERY  Water Non-Citrus Juices (without pulp, NO RED-Apple, White grape, White cranberry) Black Coffee (NO MILK/CREAM OR CREAMERS, sugar ok)  Clear Tea (NO MILK/CREAM OR CREAMERS, sugar ok) regular and decaf                             Plain Jell-O (NO RED)                                           Fruit ices (not with fruit pulp, NO RED)                                     Popsicles (NO RED)                                                               Sports drinks like Gatorade (NO RED)                    The day of surgery:  Drink ONE (1) Pre-Surgery Clear Ensure or G2 at   0715AM the morning of surgery. Drink in one sitting. Do not sip.  This drink was given to you during your hospital  pre-op appointment visit. Nothing else to drink after completing the  Pre-Surgery Clear Ensure or G2.          If you have  questions, please contact your surgeon's office.       Oral Hygiene is also important to reduce your risk of infection.                                    Remember - BRUSH YOUR TEETH THE MORNING OF SURGERY WITH YOUR REGULAR TOOTHPASTE  DENTURES WILL BE REMOVED PRIOR TO SURGERY PLEASE DO NOT APPLY Poly grip OR  ADHESIVES!!!   Do NOT smoke after Midnight   Stop all vitamins and herbal supplements 7 days before surgery.   Take these medicines the morning of surgery with A SIP OF WATER:  toprol    DO NOT TAKE ANY ORAL DIABETIC MEDICATIONS DAY OF YOUR SURGERY  Bring CPAP mask and tubing day of surgery.                              You may not have any metal on your body including hair pins, jewelry, and body piercing             Do not wear make-up, lotions, powders, perfumes/cologne, or deodorant  Do not wear nail polish including gel and S&S, artificial/acrylic nails, or any other type of covering on natural nails including finger and toenails. If you have artificial nails, gel coating, etc. that needs to be removed by a nail salon please have this removed prior to surgery or surgery may need to be canceled/ delayed if the surgeon/ anesthesia feels like they are unable to be safely monitored.   Do not shave  48 hours prior to surgery.               Men may shave face and neck.   Do not bring valuables to the hospital. [facility] IS NOT             RESPONSIBLE   FOR VALUABLES.   Contacts, glasses, dentures or bridgework may not be worn into surgery.   Bring small overnight bag day of surgery.   DO NOT BRING YOUR HOME MEDICATIONS TO THE HOSPITAL. PHARMACY WILL DISPENSE MEDICATIONS LISTED ON YOUR MEDICATION LIST TO YOU DURING YOUR ADMISSION IN THE HOSPITAL!    Patients discharged on the day of surgery will not be allowed to drive home.  Someone NEEDS to stay with you for the first 24 hours after anesthesia.   Special Instructions: Bring a copy of your healthcare power of attorney  and living will documents the day of surgery if you haven't scanned them before.              Please read over the following fact sheets you were given: IF YOU HAVE QUESTIONS ABOUT YOUR PRE-OP INSTRUCTIONS PLEASE CALL [phone].   If you received a COVID test during your pre-op visit  it is requested that you wear a mask when out in public, stay away from anyone that may not be feeling well and notify your surgeon if you develop symptoms. If you test positive for Covid or have been in contact with anyone that has tested positive in the last 10 days please notify you surgeon.      Pre-operative 5 CHG Bath Instructions   You can play a key role in reducing the risk of infection after surgery. Your skin needs to be as free of germs as possible. You can reduce the number of germs on your skin by washing with CHG (chlorhexidine  gluconate) soap before surgery. CHG is an antiseptic soap that kills germs and continues to kill germs even after washing.   DO NOT use if you have an allergy to chlorhexidine /CHG or antibacterial soaps. If your skin becomes reddened or irritated, stop using the CHG and notify one of our RNs at [phone].   Please shower with the CHG soap starting 4 days before surgery using the following schedule:     Please keep in mind the following:  DO NOT shave, including legs and underarms, starting the day of your first shower.   You may shave your face at any point before/day of surgery.  Place clean sheets on your bed the day you start using CHG soap. Use a clean washcloth (not used since being washed) for each shower. DO NOT sleep with pets once you start using the CHG.   CHG Shower Instructions:  If you choose to wash your hair and private area, wash first with your normal shampoo/soap.  After you use shampoo/soap, rinse your hair and body thoroughly to remove shampoo/soap residue.  Turn the water OFF and apply about 3 tablespoons (45 ml) of CHG soap to a CLEAN washcloth.   Apply CHG soap ONLY FROM YOUR NECK DOWN TO YOUR TOES (washing for 3-5 minutes)  DO NOT use CHG soap on face, private areas, open wounds, or sores.  Pay special attention to the area where your surgery is being performed.  If you are having back surgery, having someone wash your back for you may be helpful. Wait 2 minutes after CHG soap is applied, then you may rinse off the CHG soap.  Pat dry with a clean towel  Put on clean clothes/pajamas   If you choose to wear lotion, please use ONLY the CHG-compatible lotions on the back of this paper.     Additional instructions for the day of surgery: DO NOT APPLY any lotions, deodorants, cologne, or perfumes.   Put on clean/comfortable clothes.  Brush your teeth.  Ask your nurse before applying any prescription medications to the skin.      CHG Compatible Lotions   Aveeno Moisturizing lotion  Cetaphil Moisturizing Cream  Cetaphil Moisturizing Lotion  Clairol Herbal Essence Moisturizing Lotion, Dry Skin  Clairol Herbal Essence Moisturizing Lotion, Extra Dry Skin  Clairol Herbal Essence Moisturizing Lotion, Normal Skin  Curel Age Defying Therapeutic Moisturizing Lotion with Alpha Hydroxy  Curel Extreme Care Body Lotion  Curel Soothing Hands Moisturizing Hand Lotion  Curel Therapeutic Moisturizing Cream, Fragrance-Free  Curel Therapeutic Moisturizing Lotion, Fragrance-Free  Curel Therapeutic Moisturizing Lotion, Original Formula  Eucerin Daily Replenishing Lotion  Eucerin Dry Skin Therapy Plus Alpha Hydroxy Crme  Eucerin Dry Skin Therapy Plus Alpha Hydroxy Lotion  Eucerin Original Crme  Eucerin Original Lotion  Eucerin Plus Crme Eucerin Plus Lotion  Eucerin TriLipid Replenishing Lotion  Keri Anti-Bacterial Hand Lotion  Keri Deep Conditioning Original Lotion Dry Skin Formula Softly Scented  Keri Deep Conditioning Original Lotion, Fragrance Free Sensitive Skin Formula  Keri Lotion Fast Absorbing Fragrance Free Sensitive Skin  Formula  Keri Lotion Fast Absorbing Softly Scented Dry Skin Formula  Keri Original Lotion  Keri Skin Renewal Lotion Keri Silky Smooth Lotion  Keri Silky Smooth Sensitive Skin Lotion  Nivea Body Creamy Conditioning Oil  Nivea Body Extra Enriched Teacher, adult education Moisturizing Lotion Nivea Crme  Nivea Skin Firming Lotion  NutraDerm 30 Skin Lotion  NutraDerm Skin Lotion  NutraDerm Therapeutic Skin Cream  NutraDerm Therapeutic Skin Lotion  ProShield Protective Hand Cream  Provon moisturizing lotion

## 2024-06-18 ENCOUNTER — Encounter (HOSPITAL_COMMUNITY): Payer: Self-pay

## 2024-06-18 ENCOUNTER — Encounter (HOSPITAL_COMMUNITY)
Admission: RE | Admit: 2024-06-18 | Discharge: 2024-06-18 | Disposition: A | Discharge status: Home / Self Care | Source: Ambulatory Visit | Attending: Orthopedic Surgery | Admitting: Orthopedic Surgery

## 2024-06-18 ENCOUNTER — Other Ambulatory Visit: Payer: Self-pay

## 2024-06-18 VITALS — BP 140/67 | HR 64 | Temp 98.6°F | Resp 16 | Ht 61.5 in | Wt 170.9 lb

## 2024-06-18 DIAGNOSIS — I739 Peripheral vascular disease, unspecified: Secondary | ICD-10-CM | POA: Diagnosis not present

## 2024-06-18 DIAGNOSIS — N189 Chronic kidney disease, unspecified: Secondary | ICD-10-CM | POA: Diagnosis not present

## 2024-06-18 DIAGNOSIS — Z01812 Encounter for preprocedural laboratory examination: Secondary | ICD-10-CM | POA: Diagnosis not present

## 2024-06-18 DIAGNOSIS — I5030 Unspecified diastolic (congestive) heart failure: Secondary | ICD-10-CM | POA: Diagnosis not present

## 2024-06-18 DIAGNOSIS — Z923 Personal history of irradiation: Secondary | ICD-10-CM | POA: Insufficient documentation

## 2024-06-18 DIAGNOSIS — Z853 Personal history of malignant neoplasm of breast: Secondary | ICD-10-CM | POA: Insufficient documentation

## 2024-06-18 DIAGNOSIS — I130 Hypertensive heart and chronic kidney disease with heart failure and stage 1 through stage 4 chronic kidney disease, or unspecified chronic kidney disease: Secondary | ICD-10-CM | POA: Diagnosis not present

## 2024-06-18 DIAGNOSIS — Z87891 Personal history of nicotine dependence: Secondary | ICD-10-CM | POA: Diagnosis not present

## 2024-06-18 DIAGNOSIS — Z01818 Encounter for other preprocedural examination: Secondary | ICD-10-CM

## 2024-06-18 DIAGNOSIS — M1711 Unilateral primary osteoarthritis, right knee: Secondary | ICD-10-CM | POA: Diagnosis not present

## 2024-06-18 DIAGNOSIS — G8929 Other chronic pain: Secondary | ICD-10-CM | POA: Diagnosis not present

## 2024-06-18 HISTORY — DX: Peripheral vascular disease, unspecified: I73.9

## 2024-06-18 LAB — CBC WITH DIFFERENTIAL/PLATELET
Abs Immature Granulocytes: 0.01 K/uL (ref 0.00–0.07)
Basophils Absolute: 0 K/uL (ref 0.0–0.1)
Basophils Relative: 1 %
Eosinophils Absolute: 0.1 K/uL (ref 0.0–0.5)
Eosinophils Relative: 2 %
HCT: 39.4 % (ref 36.0–46.0)
Hemoglobin: 12.8 g/dL (ref 12.0–15.0)
Immature Granulocytes: 0 %
Lymphocytes Relative: 47 %
Lymphs Abs: 3.6 K/uL (ref 0.7–4.0)
MCH: 32.5 pg (ref 26.0–34.0)
MCHC: 32.5 g/dL (ref 30.0–36.0)
MCV: 100 fL (ref 80.0–100.0)
Monocytes Absolute: 0.6 K/uL (ref 0.1–1.0)
Monocytes Relative: 8 %
Neutro Abs: 3.2 K/uL (ref 1.7–7.7)
Neutrophils Relative %: 42 %
Platelets: 313 K/uL (ref 150–400)
RBC: 3.94 MIL/uL (ref 3.87–5.11)
RDW: 13 % (ref 11.5–15.5)
WBC: 7.6 K/uL (ref 4.0–10.5)
nRBC: 0 % (ref 0.0–0.2)

## 2024-06-18 LAB — COMPREHENSIVE METABOLIC PANEL WITH GFR
ALT: 20 U/L (ref 0–44)
AST: 23 U/L (ref 15–41)
Albumin: 3.8 g/dL (ref 3.5–5.0)
Alkaline Phosphatase: 48 U/L (ref 38–126)
Anion gap: 13 (ref 5–15)
BUN: 18 mg/dL (ref 8–23)
CO2: 22 mmol/L (ref 22–32)
Calcium: 10.1 mg/dL (ref 8.9–10.3)
Chloride: 103 mmol/L (ref 98–111)
Creatinine, Ser: 0.81 mg/dL (ref 0.44–1.00)
GFR, Estimated: >60 mL/min (ref >60)
Glucose, Bld: 92 mg/dL (ref 70–99)
Potassium: 3.6 mmol/L (ref 3.5–5.1)
Sodium: 138 mmol/L (ref 135–145)
Total Bilirubin: 0.5 mg/dL (ref 0.0–1.2)
Total Protein: 7.5 g/dL (ref 6.5–8.1)

## 2024-06-18 LAB — SURGICAL PCR SCREEN
MRSA, PCR: NEGATIVE
Staphylococcus aureus: NEGATIVE

## 2024-06-18 LAB — TYPE AND SCREEN
ABO/RH(D): A POS
Antibody Screen: NEGATIVE

## 2024-06-18 NOTE — Care Plan (Addendum)
 Ortho Bundle Case Management Note  Patient Details  Name: Kristy [name] MRN: [id number] Date of Birth: 11/11/1941  met with patient in the office for H&P. will discharge to home with family to assist. rolling walker ordered for home. HHPT referral to [facility]. OPPT set up with [facility]. discharge instructions discussed and questions answered. Patient and MD in agreement with plan. Choice offered.                     DME Arranged:  [facility] rolling DME Agency:     HH Arranged:  PT HH Agency:     Additional Comments: Please contact me with any questions of if this plan should need to change.  Charlies Pitch,  RN,BSN,MHA,CCM  [facility]  [phone] 06/18/2024, 3:20 PM

## 2024-06-19 NOTE — Progress Notes (Signed)
 Anesthesia Chart Review   Case: [id number] Date/Time: 06/30/24 1011   Procedure: ARTHROPLASTY, KNEE, TOTAL (Right: Knee)   Anesthesia type: Spinal   Pre-op diagnosis: OA RIGHT KNEE   Location: [facility] ROOM 08 / [facility]   Surgeons: Edna Toribio LABOR, MD       DISCUSSION:83 y.o. former smoker with h/o HTN, CKD, breast cancer s/p radiation 2022, HFpEF, PVD, right knee OA scheduled for above procedure 06/30/24 with Dr. Toribio Edna.   Per cardiology preoperative evaluation 05/08/2024, Chart reviewed as part of pre-operative protocol coverage. Given past medical history and time since last visit, based on ACC/AHA guidelines, CHRISHAUNA MEE would be at acceptable risk for the planned procedure without further cardiovascular testing.    Her RCRI is low risk, 0.9% risk of major cardiac event.  VS: BP (!) 140/67   Pulse 64   Temp 37 C (Oral)   Resp 16   Ht 5' 1.5 (1.562 m)   Wt 77.5 kg   SpO2 100%   BMI 31.76 kg/m   PROVIDERS: Avelina Greig BRAVO, MD is PCP   Cardiologist : Ethel Lesches, MD  LABS: Labs reviewed: Acceptable for surgery. (all labs ordered are listed, but only abnormal results are displayed)  Labs Reviewed  SURGICAL PCR SCREEN  CBC WITH DIFFERENTIAL/PLATELET  COMPREHENSIVE METABOLIC PANEL WITH GFR  TYPE AND SCREEN     IMAGES:   EKG:   CV: Echo 12/06/2023  1. Left ventricular ejection fraction, by estimation, is 60 to 65%. Left  ventricular ejection fraction by 3D volume is 65 %. The left ventricle has  normal function. The left ventricle has no regional wall motion  abnormalities. There is mild asymmetric  left ventricular hypertrophy of the septal segment. Left ventricular  diastolic parameters are consistent with Grade I diastolic dysfunction  (impaired relaxation). The average left ventricular global longitudinal  strain is -21.5 %. The global longitudinal   strain is normal.   2. Right ventricular systolic function is normal. The right  ventricular  size is normal. There is normal pulmonary artery systolic pressure. The  estimated right ventricular systolic pressure is 33.5 mmHg.   3. The mitral valve is grossly normal. Trivial mitral valve  regurgitation. No evidence of mitral stenosis.   4. The aortic valve is tricuspid. Aortic valve regurgitation is trivial.  No aortic stenosis is present.   5. The inferior vena cava is normal in size with greater than 50%  respiratory variability, suggesting right atrial pressure of 3 mmHg.  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   Constipation    uses stool softener PRN- uses once a week to once every 2 weeks    Fibromuscular dysplasia (HCC)    Gallbladder disease    GERD (gastroesophageal reflux disease)    Hyperlipidemia    Hypertension    For 16 years; urinary catecholamines within normal limits 12/09; renal Doppler ultrasound showed no evidence for renal artery stenosis   Normal echocardiogram 02/2009   LVEF 65%; no regional wall motion abnormalities; normal RV size and function; pulmonic valve had increased gradient  across w/ peak gradient of about 36 mmHg (range of moderate pulmonic stenosis) followup showed normal valve   Papilloma of breast    right   Peripheral vascular disease (HCC)    Personal history of radiation therapy    2022   Tinnitus of left ear    Tubular adenoma of colon 10/2013    Past Surgical History:  Procedure Laterality Date   ANGIOPLASTY     2012   BREAST BIOPSY Right 2006   benign   BREAST BIOPSY Right 11/09/2020   BREAST BIOPSY Right 11/23/2020   BREAST BIOPSY Right 11/29/2020   x2    BREAST EXCISIONAL BIOPSY Right 07/2017   benign   BREAST LUMPECTOMY Right 12/16/2020   BREAST LUMPECTOMY WITH RADIOACTIVE SEED LOCALIZATION Right 08/14/2017   Procedure: RIGHT BREAST LUMPECTOMY WITH RADIOACTIVE SEED LOCALIZATION;  Surgeon: Vanderbilt Ned, MD;  Location: [facility];  Service: General;  Laterality: Right;   BREAST LUMPECTOMY WITH RADIOACTIVE SEED LOCALIZATION Right 12/16/2020   Procedure: RADIOACTIVE SEED GUIDED TIMES 3 RIGHT BREAST LUMPECTOMY;  Surgeon: Vanderbilt Ned, MD;  Location: [facility];  Service: General;  Laterality: Right;   Cardiolyte  09/2006   Neg   CHOLECYSTECTOMY N/A 09/22/2021   Procedure: LAPAROSCOPIC CHOLECYSTECTOMY WITH  INTRAOPERATIVE CHOLANGIOGRAM;  Surgeon: Vanderbilt Ned, MD;  Location: [facility];  Service: General;  Laterality: N/A;   COLONOSCOPY     KNEE CARTILAGE SURGERY     right knee   POLYPECTOMY     RE-EXCISION OF BREAST LUMPECTOMY Right 01/11/2021   Procedure: RE-EXCISION OF RIGHT BREAST LUMPECTOMY;  Surgeon: Vanderbilt Ned, MD;  Location: [facility];  Service: General;  Laterality: Right;   TOTAL ABDOMINAL HYSTERECTOMY     no cervix   UPPER GASTROINTESTINAL ENDOSCOPY      MEDICATIONS:  acetaminophen  (TYLENOL ) 650 MG CR tablet   atorvastatin  (LIPITOR) 20 MG tablet   losartan  (COZAAR ) 50 MG tablet   metoprolol  succinate (TOPROL -XL) 100 MG 24 hr tablet   Multiple Vitamin (MULTIVITAMIN WITH MINERALS) TABS tablet   sodium chloride  (OCEAN) 0.65 % SOLN nasal spray   tamoxifen  (NOLVADEX ) 20 MG tablet   triamterene -hydrochlorothiazide (MAXZIDE-25) 37.5-25 MG tablet   valACYclovir  (VALTREX ) 500 MG tablet   No current facility-administered medications for this encounter.     Harlene Hoots Ward, PA-C [facility] [phone]

## 2024-06-19 NOTE — Anesthesia Preprocedure Evaluation (Addendum)
 Anesthesia Evaluation  Patient identified by MRN, date of birth, ID band Patient awake    Reviewed: Allergy & Precautions, H&P , NPO status , Patient's Chart, lab work & pertinent test results  Airway Mallampati: III  TM Distance: >3 FB Neck ROM: Full    Dental no notable dental hx. (+) Teeth Intact, Dental Advisory Given   Pulmonary neg pulmonary ROS, former smoker   Pulmonary exam normal breath sounds clear to auscultation       Cardiovascular Exercise Tolerance: Good hypertension, Pt. on medications and Pt. on home beta blockers + Peripheral Vascular Disease   Rhythm:Regular Rate:Normal     Neuro/Psych negative neurological ROS  negative psych ROS   GI/Hepatic Neg liver ROS,GERD  ,,  Endo/Other  negative endocrine ROS    Renal/GU negative Renal ROS  negative genitourinary   Musculoskeletal  (+) Arthritis , Osteoarthritis,    Abdominal   Peds  Hematology negative hematology ROS (+)   Anesthesia Other Findings   Reproductive/Obstetrics negative OB ROS                              Anesthesia Physical Anesthesia Plan  ASA: 2  Anesthesia Plan: Spinal   Post-op Pain Management: Regional block* and Tylenol  PO (pre-op)*   Induction: Intravenous  PONV Risk Score and Plan: 3 and Ondansetron , Dexamethasone  and Propofol  infusion  Airway Management Planned: Natural Airway and Simple Face Mask  Additional Equipment:   Intra-op Plan:   Post-operative Plan:   Informed Consent: I have reviewed the patients History and Physical, chart, labs and discussed the procedure including the risks, benefits and alternatives for the proposed anesthesia with the patient or authorized representative who has indicated his/her understanding and acceptance.     Dental advisory given  Plan Discussed with: CRNA  Anesthesia Plan Comments: (See PAT note 06/18/24)         Anesthesia Quick  Evaluation

## 2024-06-23 DIAGNOSIS — M545 Low back pain, unspecified: Secondary | ICD-10-CM | POA: Diagnosis not present

## 2024-06-23 DIAGNOSIS — M25552 Pain in left hip: Secondary | ICD-10-CM | POA: Diagnosis not present

## 2024-07-01 NOTE — Progress Notes (Signed)
 Updated date of surgery: 07/07/24  Updated time of arrival: 515 AM Drink Ensure at 4:30 AM  Patient will be discharged from hospital and monitored at home for 24 hours by: Derrill (boyfriend) [phone]  Patient denies any changes in allergies, medications, medical history since pre op appointment on: 06/18/24  Pre op instructions reviewed, follow up questions addressed and patient verbalized understanding at this time.

## 2024-07-07 ENCOUNTER — Ambulatory Visit (HOSPITAL_BASED_OUTPATIENT_CLINIC_OR_DEPARTMENT_OTHER): Admitting: Anesthesiology

## 2024-07-07 ENCOUNTER — Ambulatory Visit (HOSPITAL_COMMUNITY)

## 2024-07-07 ENCOUNTER — Other Ambulatory Visit: Payer: Self-pay

## 2024-07-07 ENCOUNTER — Ambulatory Visit (HOSPITAL_COMMUNITY)
Admission: RE | Admit: 2024-07-07 | Discharge: 2024-07-07 | Disposition: A | Discharge status: Home / Self Care | Attending: Orthopedic Surgery | Admitting: Orthopedic Surgery

## 2024-07-07 ENCOUNTER — Encounter (HOSPITAL_COMMUNITY)
Admission: RE | Disposition: A | Discharge status: Home / Self Care | Payer: Self-pay | Source: Home / Self Care | Attending: Orthopedic Surgery

## 2024-07-07 ENCOUNTER — Encounter (HOSPITAL_COMMUNITY): Payer: Self-pay | Admitting: Orthopedic Surgery

## 2024-07-07 ENCOUNTER — Ambulatory Visit (HOSPITAL_COMMUNITY): Payer: Self-pay | Admitting: Medical

## 2024-07-07 DIAGNOSIS — Z79899 Other long term (current) drug therapy: Secondary | ICD-10-CM | POA: Diagnosis not present

## 2024-07-07 DIAGNOSIS — I129 Hypertensive chronic kidney disease with stage 1 through stage 4 chronic kidney disease, or unspecified chronic kidney disease: Secondary | ICD-10-CM | POA: Diagnosis not present

## 2024-07-07 DIAGNOSIS — R609 Edema, unspecified: Secondary | ICD-10-CM | POA: Diagnosis not present

## 2024-07-07 DIAGNOSIS — M1711 Unilateral primary osteoarthritis, right knee: Secondary | ICD-10-CM | POA: Insufficient documentation

## 2024-07-07 DIAGNOSIS — Z6831 Body mass index (BMI) 31.0-31.9, adult: Secondary | ICD-10-CM | POA: Diagnosis not present

## 2024-07-07 DIAGNOSIS — I6529 Occlusion and stenosis of unspecified carotid artery: Secondary | ICD-10-CM | POA: Diagnosis not present

## 2024-07-07 DIAGNOSIS — I739 Peripheral vascular disease, unspecified: Secondary | ICD-10-CM | POA: Insufficient documentation

## 2024-07-07 DIAGNOSIS — B009 Herpesviral infection, unspecified: Secondary | ICD-10-CM | POA: Insufficient documentation

## 2024-07-07 DIAGNOSIS — Z87891 Personal history of nicotine dependence: Secondary | ICD-10-CM | POA: Insufficient documentation

## 2024-07-07 DIAGNOSIS — I701 Atherosclerosis of renal artery: Secondary | ICD-10-CM | POA: Diagnosis not present

## 2024-07-07 DIAGNOSIS — Z01818 Encounter for other preprocedural examination: Secondary | ICD-10-CM

## 2024-07-07 DIAGNOSIS — Z853 Personal history of malignant neoplasm of breast: Secondary | ICD-10-CM | POA: Insufficient documentation

## 2024-07-07 DIAGNOSIS — E669 Obesity, unspecified: Secondary | ICD-10-CM | POA: Diagnosis not present

## 2024-07-07 DIAGNOSIS — N189 Chronic kidney disease, unspecified: Secondary | ICD-10-CM | POA: Insufficient documentation

## 2024-07-07 DIAGNOSIS — G8918 Other acute postprocedural pain: Secondary | ICD-10-CM | POA: Diagnosis not present

## 2024-07-07 DIAGNOSIS — Z96651 Presence of right artificial knee joint: Secondary | ICD-10-CM | POA: Diagnosis not present

## 2024-07-07 DIAGNOSIS — M25561 Pain in right knee: Secondary | ICD-10-CM | POA: Diagnosis not present

## 2024-07-07 HISTORY — PX: TOTAL KNEE ARTHROPLASTY: SHX125

## 2024-07-07 LAB — TYPE AND SCREEN
ABO/RH(D): A POS
Antibody Screen: NEGATIVE

## 2024-07-07 SURGERY — ARTHROPLASTY, KNEE, TOTAL
Anesthesia: Spinal | Site: Knee | Laterality: Right

## 2024-07-07 MED ORDER — CELECOXIB 100 MG PO CAPS: 100.0000 mg | ORAL_CAPSULE | Freq: Two times a day (BID) | ORAL | 0 refills | Status: AC

## 2024-07-07 MED ORDER — ONDANSETRON HCL 4 MG/2ML IJ SOLN
4.0000 mg | Freq: Four times a day (QID) | INTRAMUSCULAR | Status: DC | PRN
Start: 2024-07-07 — End: 2024-07-07

## 2024-07-07 MED ORDER — PHENYLEPHRINE 80 MCG/ML (10ML) SYRINGE FOR IV PUSH (FOR BLOOD PRESSURE SUPPORT)
PREFILLED_SYRINGE | INTRAVENOUS | Status: AC
  Filled 2024-07-07: qty 10

## 2024-07-07 MED ORDER — METHOCARBAMOL 500 MG PO TABS
500.0000 mg | ORAL_TABLET | Freq: Four times a day (QID) | ORAL | Status: DC | PRN
  Administered 2024-07-07: 500 mg via ORAL

## 2024-07-07 MED ORDER — TRANEXAMIC ACID-NACL 1000-0.7 MG/100ML-% IV SOLN
1000.0000 mg | INTRAVENOUS | Status: AC
  Administered 2024-07-07: 1000 mg via INTRAVENOUS
  Filled 2024-07-07: qty 100

## 2024-07-07 MED ORDER — ORAL CARE MOUTH RINSE: 15.0000 mL | Freq: Once | OROMUCOSAL | Status: AC

## 2024-07-07 MED ORDER — LACTATED RINGERS IV BOLUS: 250.0000 mL | Freq: Once | INTRAVENOUS | Status: DC

## 2024-07-07 MED ORDER — HYDROMORPHONE HCL 1 MG/ML IJ SOLN: 0.5000 mg | INTRAMUSCULAR | Status: DC | PRN

## 2024-07-07 MED ORDER — 0.9 % SODIUM CHLORIDE (POUR BTL) OPTIME
TOPICAL | Status: DC | PRN
  Administered 2024-07-07: 1000 mL

## 2024-07-07 MED ORDER — PHENYLEPHRINE 80 MCG/ML (10ML) SYRINGE FOR IV PUSH (FOR BLOOD PRESSURE SUPPORT)
PREFILLED_SYRINGE | INTRAVENOUS | Status: DC | PRN
  Administered 2024-07-07: 40 ug via INTRAVENOUS
  Administered 2024-07-07: 80 ug via INTRAVENOUS

## 2024-07-07 MED ORDER — KETOROLAC TROMETHAMINE 15 MG/ML IJ SOLN
INTRAMUSCULAR | Status: AC
  Filled 2024-07-07: qty 1

## 2024-07-07 MED ORDER — METHOCARBAMOL 1000 MG/10ML IJ SOLN: 500.0000 mg | Freq: Four times a day (QID) | INTRAMUSCULAR | Status: DC | PRN

## 2024-07-07 MED ORDER — ONDANSETRON HCL 4 MG PO TABS: 4.0000 mg | ORAL_TABLET | Freq: Three times a day (TID) | ORAL | 0 refills | Status: AC | PRN

## 2024-07-07 MED ORDER — BUPIVACAINE-EPINEPHRINE (PF) 0.25% -1:200000 IJ SOLN
INTRAMUSCULAR | Status: AC
  Filled 2024-07-07: qty 30

## 2024-07-07 MED ORDER — FENTANYL CITRATE PF 50 MCG/ML IJ SOSY: 25.0000 ug | PREFILLED_SYRINGE | INTRAMUSCULAR | Status: DC | PRN

## 2024-07-07 MED ORDER — DEXAMETHASONE SODIUM PHOSPHATE 10 MG/ML IJ SOLN
INTRAMUSCULAR | Status: AC
  Filled 2024-07-07: qty 1

## 2024-07-07 MED ORDER — OXYCODONE HCL 5 MG PO TABS: 5.0000 mg | ORAL_TABLET | ORAL | 0 refills | Status: AC | PRN

## 2024-07-07 MED ORDER — SODIUM CHLORIDE 0.9 % IV SOLN: INTRAVENOUS | Status: DC

## 2024-07-07 MED ORDER — EPHEDRINE 5 MG/ML INJ
INTRAVENOUS | Status: AC
  Filled 2024-07-07: qty 5

## 2024-07-07 MED ORDER — POLYETHYLENE GLYCOL 3350 17 G PO PACK: 17.0000 g | PACK | Freq: Every day | ORAL | 0 refills | Status: DC

## 2024-07-07 MED ORDER — PHENYLEPHRINE HCL-NACL 20-0.9 MG/250ML-% IV SOLN
INTRAVENOUS | Status: DC | PRN
Start: 2024-07-07 — End: 2024-07-07
  Administered 2024-07-07: 40 ug/min via INTRAVENOUS

## 2024-07-07 MED ORDER — PROPOFOL 10 MG/ML IV BOLUS
INTRAVENOUS | Status: DC | PRN
  Administered 2024-07-07: 10 mg via INTRAVENOUS
  Administered 2024-07-07: 40 mg via INTRAVENOUS

## 2024-07-07 MED ORDER — FENTANYL CITRATE (PF) 100 MCG/2ML IJ SOLN
INTRAMUSCULAR | Status: DC | PRN
  Administered 2024-07-07: 50 ug via INTRAVENOUS
  Administered 2024-07-07 (×2): 25 ug via INTRAVENOUS

## 2024-07-07 MED ORDER — SODIUM CHLORIDE 0.9 % IV SOLN
INTRAVENOUS | Status: DC | PRN
  Administered 2024-07-07: 80 mL

## 2024-07-07 MED ORDER — BUPIVACAINE LIPOSOME 1.3 % IJ SUSP
INTRAMUSCULAR | Status: AC
  Filled 2024-07-07: qty 20

## 2024-07-07 MED ORDER — BUPIVACAINE IN DEXTROSE 0.75-8.25 % IT SOLN
INTRATHECAL | Status: DC | PRN
  Administered 2024-07-07: 1.8 mL via INTRATHECAL

## 2024-07-07 MED ORDER — ASPIRIN 81 MG PO TBEC: 81.0000 mg | DELAYED_RELEASE_TABLET | Freq: Two times a day (BID) | ORAL | Status: AC

## 2024-07-07 MED ORDER — SODIUM CHLORIDE (PF) 0.9 % IJ SOLN
INTRAMUSCULAR | Status: AC
  Filled 2024-07-07: qty 30

## 2024-07-07 MED ORDER — PROPOFOL 500 MG/50ML IV EMUL
INTRAVENOUS | Status: DC | PRN
Start: 2024-07-07 — End: 2024-07-07
  Administered 2024-07-07: 35 ug/kg/min via INTRAVENOUS

## 2024-07-07 MED ORDER — PROPOFOL 1000 MG/100ML IV EMUL
INTRAVENOUS | Status: AC
  Filled 2024-07-07: qty 100

## 2024-07-07 MED ORDER — CEFAZOLIN SODIUM-DEXTROSE 2-4 GM/100ML-% IV SOLN: 2.0000 g | Freq: Four times a day (QID) | INTRAVENOUS | Status: DC

## 2024-07-07 MED ORDER — LACTATED RINGERS IV BOLUS
500.0000 mL | Freq: Once | INTRAVENOUS | Status: AC
  Administered 2024-07-07: 500 mL via INTRAVENOUS

## 2024-07-07 MED ORDER — ONDANSETRON HCL 4 MG/2ML IJ SOLN
INTRAMUSCULAR | Status: AC
  Filled 2024-07-07: qty 2

## 2024-07-07 MED ORDER — ACETAMINOPHEN 500 MG PO TABS
ORAL_TABLET | ORAL | Status: AC
  Filled 2024-07-07: qty 2

## 2024-07-07 MED ORDER — EPHEDRINE SULFATE-NACL 50-0.9 MG/10ML-% IV SOSY
PREFILLED_SYRINGE | INTRAVENOUS | Status: DC | PRN
  Administered 2024-07-07 (×5): 5 mg via INTRAVENOUS

## 2024-07-07 MED ORDER — ISOPROPYL ALCOHOL 70 % SOLN
Status: DC | PRN
  Administered 2024-07-07: 1 via TOPICAL

## 2024-07-07 MED ORDER — OMEPRAZOLE 40 MG PO CPDR: 40.0000 mg | DELAYED_RELEASE_CAPSULE | Freq: Every day | ORAL | 0 refills | Status: DC

## 2024-07-07 MED ORDER — OXYCODONE HCL 5 MG PO TABS
5.0000 mg | ORAL_TABLET | ORAL | Status: DC | PRN
  Administered 2024-07-07: 5 mg via ORAL

## 2024-07-07 MED ORDER — ACETAMINOPHEN 500 MG PO TABS
1000.0000 mg | ORAL_TABLET | Freq: Once | ORAL | Status: AC
  Administered 2024-07-07: 1000 mg via ORAL
  Filled 2024-07-07: qty 2

## 2024-07-07 MED ORDER — POVIDONE-IODINE 10 % EX SWAB: 2.0000 | Freq: Once | CUTANEOUS | Status: DC

## 2024-07-07 MED ORDER — CEFAZOLIN SODIUM-DEXTROSE 2-4 GM/100ML-% IV SOLN
2.0000 g | INTRAVENOUS | Status: AC
  Administered 2024-07-07: 2 g via INTRAVENOUS
  Filled 2024-07-07: qty 100

## 2024-07-07 MED ORDER — METHOCARBAMOL 500 MG PO TABS
ORAL_TABLET | ORAL | Status: AC
  Filled 2024-07-07: qty 1

## 2024-07-07 MED ORDER — CHLORHEXIDINE GLUCONATE 0.12 % MT SOLN
15.0000 mL | Freq: Once | OROMUCOSAL | Status: AC
  Administered 2024-07-07: 15 mL via OROMUCOSAL

## 2024-07-07 MED ORDER — SODIUM CHLORIDE 0.9 % IR SOLN
Status: DC | PRN
  Administered 2024-07-07 (×2): 1000 mL

## 2024-07-07 MED ORDER — ACETAMINOPHEN 500 MG PO TABS: 1000.0000 mg | ORAL_TABLET | Freq: Three times a day (TID) | ORAL | Status: AC | PRN

## 2024-07-07 MED ORDER — KETOROLAC TROMETHAMINE 15 MG/ML IJ SOLN
7.5000 mg | Freq: Four times a day (QID) | INTRAMUSCULAR | Status: DC
  Administered 2024-07-07: 7.5 mg via INTRAVENOUS

## 2024-07-07 MED ORDER — BUPIVACAINE-EPINEPHRINE (PF) 0.5% -1:200000 IJ SOLN
INTRAMUSCULAR | Status: DC | PRN
  Administered 2024-07-07: 20 mL

## 2024-07-07 MED ORDER — LIDOCAINE HCL (PF) 2 % IJ SOLN
INTRAMUSCULAR | Status: DC | PRN
  Administered 2024-07-07: 60 mg via INTRADERMAL

## 2024-07-07 MED ORDER — ISOPROPYL ALCOHOL 70 % SOLN
Status: AC
  Filled 2024-07-07: qty 480

## 2024-07-07 MED ORDER — ONDANSETRON HCL 4 MG PO TABS: 4.0000 mg | ORAL_TABLET | Freq: Four times a day (QID) | ORAL | Status: DC | PRN

## 2024-07-07 MED ORDER — ACETAMINOPHEN 500 MG PO TABS
1000.0000 mg | ORAL_TABLET | Freq: Four times a day (QID) | ORAL | Status: DC
  Administered 2024-07-07: 1000 mg via ORAL

## 2024-07-07 MED ORDER — OXYCODONE HCL 5 MG PO TABS
ORAL_TABLET | ORAL | Status: AC
  Filled 2024-07-07: qty 1

## 2024-07-07 MED ORDER — LACTATED RINGERS IV SOLN: INTRAVENOUS | Status: DC

## 2024-07-07 MED ORDER — LIDOCAINE HCL (PF) 2 % IJ SOLN
INTRAMUSCULAR | Status: AC
  Filled 2024-07-07: qty 5

## 2024-07-07 MED ORDER — ONDANSETRON HCL 4 MG/2ML IJ SOLN
INTRAMUSCULAR | Status: DC | PRN
  Administered 2024-07-07: 4 mg via INTRAVENOUS

## 2024-07-07 MED ORDER — METHOCARBAMOL 500 MG PO TABS: 500.0000 mg | ORAL_TABLET | Freq: Three times a day (TID) | ORAL | 0 refills | Status: AC | PRN

## 2024-07-07 MED ORDER — BUPIVACAINE LIPOSOME 1.3 % IJ SUSP: 20.0000 mL | Freq: Once | INTRAMUSCULAR | Status: DC

## 2024-07-07 MED ORDER — DEXAMETHASONE SODIUM PHOSPHATE 10 MG/ML IJ SOLN
8.0000 mg | Freq: Once | INTRAMUSCULAR | Status: AC
  Administered 2024-07-07: 8 mg via INTRAVENOUS

## 2024-07-07 MED ORDER — FENTANYL CITRATE (PF) 100 MCG/2ML IJ SOLN
INTRAMUSCULAR | Status: AC
  Filled 2024-07-07: qty 2

## 2024-07-07 SURGICAL SUPPLY — 51 items
BAG COUNTER SPONGE SURGICOUNT (BAG) IMPLANT
BLADE SAG 18X100X1.27 (BLADE) ×1 IMPLANT
BLADE SAW SAG 35X64 .89 (BLADE) ×1 IMPLANT
BLADE SAW SGTL 11.0X1.19X90.0M (BLADE) IMPLANT
BLADE SURG 15 STRL LF DISP TIS (BLADE) IMPLANT
BNDG COHESIVE 3X5 TAN ST LF (GAUZE/BANDAGES/DRESSINGS) ×1 IMPLANT
BNDG ELASTIC 6X10 VLCR STRL LF (GAUZE/BANDAGES/DRESSINGS) ×1 IMPLANT
BOWL SMART MIX CTS (DISPOSABLE) IMPLANT
CEMENT BONE R 1X40 (Cement) IMPLANT
CHLORAPREP W/TINT 26 (MISCELLANEOUS) ×2 IMPLANT
COMPONENT FEM CMT KN 7 STD RT (Joint) IMPLANT
COVER SURGICAL LIGHT HANDLE (MISCELLANEOUS) ×1 IMPLANT
CUFF TRNQT CYL 34X4.125X (TOURNIQUET CUFF) ×1 IMPLANT
DERMABOND ADVANCED .7 DNX12 (GAUZE/BANDAGES/DRESSINGS) ×1 IMPLANT
DRAPE INCISE IOBAN 85X60 (DRAPES) ×1 IMPLANT
DRAPE SHEET LG 3/4 BI-LAMINATE (DRAPES) ×1 IMPLANT
DRAPE U-SHAPE 47X51 STRL (DRAPES) ×1 IMPLANT
DRSG AQUACEL AG ADV 3.5X10 (GAUZE/BANDAGES/DRESSINGS) ×1 IMPLANT
ELECT REM PT RETURN 15FT ADLT (MISCELLANEOUS) ×1 IMPLANT
GAUZE SPONGE 4X4 12PLY STRL (GAUZE/BANDAGES/DRESSINGS) ×1 IMPLANT
GLOVE BIO SURGEON STRL SZ 6.5 (GLOVE) ×2 IMPLANT
GLOVE BIOGEL PI IND STRL 6.5 (GLOVE) ×1 IMPLANT
GLOVE BIOGEL PI IND STRL 8 (GLOVE) ×1 IMPLANT
GLOVE SURG ORTHO 8.0 STRL STRW (GLOVE) ×2 IMPLANT
GOWN STRL REUS W/ TWL XL LVL3 (GOWN DISPOSABLE) ×2 IMPLANT
HOLDER FOLEY CATH W/STRAP (MISCELLANEOUS) ×1 IMPLANT
HOOD PEEL AWAY T7 (MISCELLANEOUS) ×3 IMPLANT
INSERT TIB ASF PERS CD6-7 RT (Insert) IMPLANT
KIT TURNOVER KIT A (KITS) ×1 IMPLANT
MANIFOLD NEPTUNE II (INSTRUMENTS) ×1 IMPLANT
MARKER SKIN DUAL TIP RULER LAB (MISCELLANEOUS) ×1 IMPLANT
NS IRRIG 1000ML POUR BTL (IV SOLUTION) ×1 IMPLANT
PACK TOTAL KNEE CUSTOM (KITS) ×1 IMPLANT
PENCIL SMOKE EVACUATOR (MISCELLANEOUS) ×1 IMPLANT
PIN DRILL HDLS TROCAR 75 4PK (PIN) IMPLANT
SCREW HEADED 33MM KNEE (MISCELLANEOUS) IMPLANT
SET HNDPC FAN SPRY TIP SCT (DISPOSABLE) ×1 IMPLANT
SOLUTION IRRIG SURGIPHOR (IV SOLUTION) IMPLANT
SOLUTION PRONTOSAN WOUND 350ML (IRRIGATION / IRRIGATOR) IMPLANT
STEM POLY PAT PLY 32M KNEE (Knees) IMPLANT
STEM TIBIA 5 DEG SZ D R KNEE (Knees) IMPLANT
STRIP CLOSURE SKIN 1/2X4 (GAUZE/BANDAGES/DRESSINGS) ×1 IMPLANT
SUT MNCRL AB 3-0 PS2 18 (SUTURE) ×1 IMPLANT
SUT STRATAFIX 14 PDO 48 VLT (SUTURE) ×1 IMPLANT
SUT VIC AB 2-0 CT2 27 (SUTURE) ×2 IMPLANT
SUTURE STRATFX 0 PDS 27 VIOLET (SUTURE) ×1 IMPLANT
SYR 50ML LL SCALE MARK (SYRINGE) ×1 IMPLANT
TRAY FOLEY MTR SLVR 14FR STAT (SET/KITS/TRAYS/PACK) IMPLANT
TUBE SUCTION HIGH CAP CLEAR NV (SUCTIONS) ×1 IMPLANT
UNDERPAD 30X36 HEAVY ABSORB (UNDERPADS AND DIAPERS) ×1 IMPLANT
WRAP KNEE MAXI GEL POST OP (GAUZE/BANDAGES/DRESSINGS) ×1 IMPLANT

## 2024-07-07 NOTE — Transfer of Care (Signed)
 Immediate Anesthesia Transfer of Care Note  Patient: Kristy [name]  Procedure(s) Performed: ARTHROPLASTY, KNEE, TOTAL (Right: Knee)  Patient Location: PACU  Anesthesia Type:Spinal  Level of Consciousness: oriented, drowsy, and patient cooperative  Airway & Oxygen Therapy: Patient Spontanous Breathing and Patient connected to face mask oxygen  Post-op Assessment: Report given to RN and Post -op Vital signs reviewed and stable  Post vital signs: Reviewed and stable  Last Vitals:  Vitals Value Taken Time  BP 110/69 07/07/24 09:49  Temp    Pulse 65 07/07/24 09:46  Resp 12 07/07/24 09:46  SpO2 99 % 07/07/24 09:46  Vitals shown include unfiled device data.  Last Pain:  Vitals:   07/07/24 0621  TempSrc:   PainSc: 3          Complications: No notable events documented.

## 2024-07-07 NOTE — Progress Notes (Signed)
 Orthopedic Tech Progress Note Patient Details:  Kristy [name] [birth year] [id number] Applied bone foam per order.  Ortho Devices Type of Ortho Device: Bone foam zero knee Ortho Device/Splint Location: RLE Ortho Device/Splint Interventions: Ordered, Application, Adjustment   Post Interventions Patient Tolerated: Well Instructions Provided: Adjustment of device, Care of device, Poper ambulation with device  Morna Pink 07/07/2024, 10:01 AM

## 2024-07-07 NOTE — Anesthesia Procedure Notes (Signed)
 Anesthesia Regional Block: Adductor canal block   Pre-Anesthetic Checklist: , timeout performed,  Correct Patient, Correct Site, Correct Laterality,  Correct Procedure, Correct Position, site marked,  Risks and benefits discussed,  Pre-op evaluation,  At surgeon's request and post-op pain management  Laterality: Right  Prep: Maximum Sterile Barrier Precautions used, chloraprep       Needles:  Injection technique: Single-shot  Needle Type: Echogenic Stimulator Needle     Needle Length: 9cm  Needle Gauge: 21     Additional Needles:   Procedures:,,,, ultrasound used (permanent image in chart),,    Narrative:  Start time: 07/07/2024 6:55 AM End time: 07/07/2024 7:05 AM Injection made incrementally with aspirations every 5 mL.  Performed by: Personally  Anesthesiologist: Epifanio Fallow, MD

## 2024-07-07 NOTE — Anesthesia Postprocedure Evaluation (Signed)
 Anesthesia Post Note  Patient: Kristy [name]  Procedure(s) Performed: ARTHROPLASTY, KNEE, TOTAL (Right: Knee)     Patient location during evaluation: PACU Anesthesia Type: Spinal Level of consciousness: oriented and awake and alert Pain management: pain level controlled Vital Signs Assessment: post-procedure vital signs reviewed and stable Respiratory status: spontaneous breathing and respiratory function stable Cardiovascular status: blood pressure returned to baseline and stable Postop Assessment: no headache, no backache, no apparent nausea or vomiting, spinal receding and patient able to bend at knees Anesthetic complications: no   No notable events documented.  Last Vitals:  Vitals:   07/07/24 1044 07/07/24 1100  BP: 125/64 (!) 137/52  Pulse:  68  Resp: 16   Temp: 36.5 C   SpO2:  99%    Last Pain:  Vitals:   07/07/24 1112  TempSrc:   PainSc: 4                  Needham Biggins,W. EDMOND

## 2024-07-07 NOTE — Op Note (Signed)
 DATE OF SURGERY:  07/07/2024 TIME: 9:13 AM  PATIENT NAME:  Kristy [name]   AGE: 83 y.o.    PRE-OPERATIVE DIAGNOSIS: End-stage right knee osteoarthritis  POST-OPERATIVE DIAGNOSIS:  Same  PROCEDURE: Cemented nickel free right total Knee Arthroplasty  SURGEON:  Elita Dame A Elzora Cullins, MD   ASSISTANT: Bernarda Mclean, PA-C, present and scrubbed throughout the case, critical for assistance with exposure, retraction, instrumentation, and closure.   OPERATIVE IMPLANTS:  Cemented Zimmer persona size right standard titanium femur, size D right tibial baseplate, 11 mm MC poly insert, 32 mm all poly cemented patella Implant Name Type Inv. Item Serial No. Manufacturer Lot No. LRB No. Used Action  CEMENT BONE R 1X40 - [id number] Cement CEMENT BONE R 1X40  [employer](ORTH,TRAU,BIO,SG) [id number] Right 2 Implanted  STEM TIBIA 5 DEG SZ D R KNEE - [id number] Knees STEM TIBIA 5 DEG SZ D R KNEE  [employer](ORTH,TRAU,BIO,SG) [id number] Right 1 Implanted  STEM POLY PAT PLY 26M KNEE - [id number] Knees STEM POLY PAT PLY 26M KNEE  [employer]IMMER [employer]ON(ORTH,TRAU,BIO,SG) [id number] Right 1 Implanted  COMPONENT FEM CMT KN 7 STD RT - [id number] Joint COMPONENT FEM CMT KN 7 STD RT  [employer](ORTH,TRAU,BIO,SG) [id number] Right 1 Implanted  INSERT TIB ASF PERS CD6-7 RT - [id number] Insert INSERT TIB ASF PERS CD6-7 RT  [employer](ORTH,TRAU,BIO,SG) [id number] Right 1 Implanted      PREOPERATIVE INDICATIONS:  Kristy [name] is a 83 y.o. year old female with end stage bone on bone degenerative arthritis of the knee who failed conservative treatment, including injections, antiinflammatories, activity modification, and assistive devices, and had significant impairment of their activities of daily living, and elected for Total Knee Arthroplasty.   The risks, benefits, and alternatives were discussed at length including but not limited to the risks of infection, bleeding, nerve injury, stiffness, blood clots, the need for  revision surgery, cardiopulmonary complications, among others, and they were willing to proceed.  ESTIMATED BLOOD LOSS: 100cc  OPERATIVE DESCRIPTION:   Once adequate anesthesia was induced, preoperative antibiotics, 2 gm of ancef ,1 gm of Tranexamic Acid , and 8 mg of Decadron  administered, the patient was positioned supine with a right thigh tourniquet placed.  The right lower extremity was prepped and draped in sterile fashion.  A time-  out was performed identifying the patient, planned procedure, and the appropriate extremity.     The leg was  exsanguinated, tourniquet elevated to 250 mmHg.  A midline incision was  made followed by median parapatellar arthrotomy. Anterior horn of the medial meniscus was released and resected. A medial release was performed, the infrapatellar fat pad was resected with care taken to protect the patellar tendon. The suprapatellar fat was removed to exposed the distal anterior femur. The anterior horn of the lateral meniscus and ACL were released.    Following initial  exposure, I first started with the femur  The femoral  canal was opened with a drill, canal was suctioned to try to prevent fat emboli.  An  intramedullary rod was passed set at 6 degrees valgus, 11 mm. The distal femur was resected.  Following this resection, the tibia was  subluxated anteriorly.  Using the extramedullary guide, 10 mm of bone was resected off   the proximal lateral tibia.  We confirmed the gap would be  stable medially and laterally with a size 10mm spacer block as well as confirmed that the tibial cut was perpendicular in the coronal plane, checking with an alignment rod.    Once this was [name],  the posterior femoral referencing femoral sizer was placed under to the posterior condyles with 3 degrees of external rotational which was parallel to the transepicondylar axis and perpendicular to [employer]. The femur was sized to be a size 7 in the anterior-  posterior dimension. The   anterior, posterior, and  chamfer cuts were made without difficulty nor   notching making certain that I was along the anterior cortex to help  with flexion gap stability. Next a laminar spreader was placed with the knee in flexion and the medial lateral menisci were resected.  5 cc of the Exparel  mixture was injected in the medial side of the back of the knee and 3 cc in the lateral side.  1/2 inch curved osteotome was used to resect posterior osteophyte that was then removed with a pituitary rongeur.       At this point, the tibia was sized to be a size D.  The size D tray was  then pinned in position. Trial reduction was now carried with a 7 femur, D tibia, a 11 mm MC insert.  The knee had full extension and was stable to varus valgus stress in extension.  The knee was slightly tight in flexion and the PCL was partially released.   Attention was next directed to the patella.  Precut  measurement was noted to be 22 mm.  I resected down to 13 mm and used a  32mm patellar button to restore patellar height as well as cover the cut surface.     The patella lug holes were drilled and a 32 mm patella poly trial was placed.    The knee was brought to full extension with good flexion stability with the patella tracking through the trochlea without application of pressure.     Next the femoral component was again assessed and determined to be seated and appropriately lateralized.  The femoral lug holes were drilled.  The femoral component was then removed. Tibial component was again assessed and felt to be seated and appropriately rotated with the medial third of the tubercle. The tibia was then drilled, and keel punched.     Final components were  opened and cement was mixed.      Final implants were then  cemented onto cleaned and dried cut surfaces of bone with the knee brought to extension with a 11 mm MC poly.  The knee was irrigated with sterile Betadine  diluted in saline as well as pulse lavage  normal saline.  The synovial lining was  then injected a dilute Exparel  with 30cc of 0.25% marcaine  with epinephrine .         Once the cement had fully cured, excess cement was removed throughout the knee.  I confirmed that I was satisfied with the range of motion and stability, and the final 11 mm MC poly insert was chosen.  It was placed into the knee.         The tourniquet had been let down at 67 minutes.  No significant hemostasis was required.  The medial parapatellar arthrotomy was then reapproximated using  #1 Stratafix sutures with the knee  in flexion.  The remaining wound was closed with 0 stratafix, 2-0 Vicryl, and running 3-0 Monocryl. The knee was cleaned, dried, dressed sterilely using Dermabond and   Aquacel dressing.  The patient was then brought to recovery room in stable condition, tolerating the procedure  well. There were no complications.   Post op recs: WB: WBAT Abx: ancef  Imaging:  PACU xrays DVT prophylaxis: Aspirin  81mg  BID x4 weeks Follow up: 2 weeks after surgery for a wound check with Dr. Edna at [facility].  Address: [address], [place], [place] [address]  Office Phone: [phone]  Toribio Edna, MD Orthopaedic Surgery

## 2024-07-07 NOTE — Interval H&P Note (Signed)

## 2024-07-07 NOTE — Discharge Instructions (Signed)
 INSTRUCTIONS AFTER JOINT REPLACEMENT   Remove items at home which could result in a fall. This includes throw rugs or furniture in walking pathways ICE to the affected joint every three hours while awake for 30 minutes at a time, for at least the first 3-5 days, and then as needed for pain and swelling.  Continue to use ice for pain and swelling. You may notice swelling that will progress down to the foot and ankle.  This is normal after surgery.  Elevate your leg when you are not up walking on it.   Continue to use the breathing machine you got in the hospital (incentive spirometer) which will help keep your temperature down.  It is common for your temperature to cycle up and down following surgery, especially at night when you are not up moving around and exerting yourself.  The breathing machine keeps your lungs expanded and your temperature down.  DIET:  As you were doing prior to hospitalization, we recommend a well-balanced diet.  DRESSING / WOUND CARE / SHOWERING:  Keep the surgical dressing until follow up.  The dressing is water  proof, so you can shower without any extra covering.  IF THE DRESSING FALLS OFF or the wound gets wet inside, change the dressing with sterile gauze.  Please use good hand washing techniques before changing the dressing.  Do not use any lotions or creams on the incision until instructed by your surgeon.    ACTIVITY  Increase activity slowly as tolerated, but follow the weight bearing instructions below.   No driving for 6 weeks or until further direction given by your physician.  You cannot drive while taking narcotics.  No lifting or carrying greater than 10 lbs. until further directed by your surgeon. Avoid periods of inactivity such as sitting longer than an hour when not asleep. This helps prevent blood clots.  You may return to work once you are authorized by your doctor.   WEIGHT BEARING: Weight bearing as tolerated with assist device (walker, cane, etc) as  directed, use it as long as suggested by your surgeon or therapist, typically at least 4-6 weeks.  EXERCISES  Results after joint replacement surgery are often greatly improved when you follow the exercise, range of motion and muscle strengthening exercises prescribed by your doctor. Safety measures are also important to protect the joint from further injury. Any time any of these exercises cause you to have increased pain or swelling, decrease what you are doing until you are comfortable again and then slowly increase them. If you have problems or questions, call your caregiver or physical therapist for advice.   Rehabilitation is important following a joint replacement. After just a few days of immobilization, the muscles of the leg can become weakened and shrink (atrophy).  These exercises are designed to build up the tone and strength of the thigh and leg muscles and to improve motion. Often times heat used for twenty to thirty minutes before working out will loosen up your tissues and help with improving the range of motion but do not use heat for the first two weeks following surgery (sometimes heat can increase post-operative swelling).   These exercises can be done on a training (exercise) mat, on the floor, on a table or on a bed. Use whatever works the best and is most comfortable for you.    Use music or television while you are exercising so that the exercises are a pleasant break in your day. This will make your life  better with the exercises acting as a break in your routine that you can look forward to.   Perform all exercises about fifteen times, three times per day or as directed.  You should exercise both the operative leg and the other leg as well.  Exercises include:   Quad Sets - Tighten up the muscle on the front of the thigh (Quad) and hold for 5-10 seconds.   Straight Leg Raises - With your knee straight (if you were given a brace, keep it on), lift the leg to 60 degrees, hold  for 3 seconds, and slowly lower the leg.  Perform this exercise against resistance later as your leg gets stronger.  Leg Slides: Lying on your back, slowly slide your foot toward your buttocks, bending your knee up off the floor (only go as far as is comfortable). Then slowly slide your foot back down until your leg is flat on the floor again.  Angel Wings: Lying on your back spread your legs to the side as far apart as you can without causing discomfort.  Hamstring Strength:  Lying on your back, push your heel against the floor with your leg straight by tightening up the muscles of your buttocks.  Repeat, but this time bend your knee to a comfortable angle, and push your heel against the floor.  You may put a pillow under the heel to make it more comfortable if necessary.   A rehabilitation program following joint replacement surgery can speed recovery and prevent re-injury in the future due to weakened muscles. Contact your doctor or a physical therapist for more information on knee rehabilitation.   CONSTIPATION:  Constipation is defined medically as fewer than three stools per week and severe constipation as less than one stool per week.  Even if you have a regular bowel pattern at home, your normal regimen is likely to be disrupted due to multiple reasons following surgery.  Combination of anesthesia, postoperative narcotics, change in appetite and fluid intake all can affect your bowels.   YOU MUST use at least one of the following options; they are listed in order of increasing strength to get the job done.  They are all available over the counter, and you may need to use some, POSSIBLY even all of these options:    Drink plenty of fluids (prune juice may be helpful) and high fiber foods Colace 100 mg by mouth twice a day  Senokot for constipation as directed and as needed Dulcolax (bisacodyl ), take with full glass of water   Miralax  (polyethylene glycol) once or twice a day as needed.  If you  have tried all these things and are unable to have a bowel movement in the first 3-4 days after surgery call either your surgeon or your primary doctor.    If you experience loose stools or diarrhea, hold the medications until you stool forms back up.  If your symptoms do not get better within 1 week or if they get worse, check with your doctor.  If you experience "the worst abdominal pain ever" or develop nausea or vomiting, please contact the office immediately for further recommendations for treatment.  ITCHING:  If you experience itching with your medications, try taking only a single pain pill, or even half a pain pill at a time.  You can also use Benadryl  over the counter for itching or also to help with sleep.   TED HOSE STOCKINGS:  Use stockings on both legs until for at least 2 weeks or  as directed by physician office. They may be removed at night for sleeping.  MEDICATIONS:  See your medication summary on the "After Visit Summary" that nursing will review with you.  You may have some home medications which will be placed on hold until you complete the course of blood thinner medication.  It is important for you to complete the blood thinner medication as prescribed.  Blood clot prevention (DVT Prophylaxis): After surgery you are at an increased risk for a blood clot.  You were prescribed a blood thinner, Aspirin  81mg , to be taken twice daily for a total of 4 weeks from surgery to help reduce your risk of getting a blood clot.  Signs of a pulmonary embolus (blood clot in the lungs) include sudden short of breath, feeling lightheaded or dizzy, chest pain with a deep breath, rapid pulse rapid breathing.  Signs of a blood clot in your arms or legs include new unexplained swelling and cramping, warm, red or darkened skin around the painful area.  Please call the office or 911 right away if these signs or symptoms develop.  PRECAUTIONS:   If you experience chest pain or shortness of breath - call  911 immediately for transfer to the hospital emergency department.   If you develop a fever greater that 101 F, purulent drainage from wound, increased redness or drainage from wound, foul odor from the wound/dressing, or calf pain - CONTACT YOUR SURGEON.                                                   FOLLOW-UP APPOINTMENTS:  If you do not already have a post-op appointment, please call the office for an appointment to be seen by your surgeon.  Guidelines for how soon to be seen are listed in your "After Visit Summary", but are typically between 2-3 weeks after surgery.  If you have a specialized bandage, you may be told to follow up 1 week after surgery.  OTHER INSTRUCTIONS:  Knee Replacement:  Do not place pillow under knee, focus on keeping the knee straight while resting.  Place foam block, curve side up under heel at all times except when walking.  DO NOT modify, tear, cut, or change the foam block in any way.  POST-OPERATIVE OPIOID TAPER INSTRUCTIONS: It is important to wean off of your opioid medication as soon as possible. If you do not need pain medication after your surgery it is ok to stop day one. Opioids include: Codeine, Hydrocodone (Norco, Vicodin), Oxycodone(Percocet, oxycontin) and hydromorphone  amongst others.  Long term and even short term use of opiods can cause: Increased pain response Dependence Constipation Depression Respiratory depression And more.  Withdrawal symptoms can include Flu like symptoms Nausea, vomiting And more Techniques to manage these symptoms Hydrate well Eat regular healthy meals Stay active Use relaxation techniques(deep breathing, meditating, yoga) Do Not substitute Alcohol to help with tapering If you have been on opioids for less than two weeks and do not have pain than it is ok to stop all together.  Plan to wean off of opioids This plan should start within one week post op of your joint replacement. Maintain the same interval or time  between taking each dose and first decrease the dose.  Cut the total daily intake of opioids by one tablet each day Next start to increase the time between  doses. The last dose that should be eliminated is the evening dose.   MAKE SURE YOU:  Understand these instructions.  Get help right away if you are not doing well or get worse.    Thank you for letting us  be a part of your medical care team.  It is a privilege we respect greatly.  We hope these instructions will help you stay on track for a fast and full recovery!

## 2024-07-07 NOTE — Evaluation (Signed)
 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 PMH:PVD, fibomuscular dz  Clinical Impression  The patient reports no pain, just stiffness in the Right knee. Patient aqmbuklated x 25' using a RW, reported dizziness at end of distance. BP  144/58. RN aware. Patient has met PT goals for Dc if dizziness does not become an issue when she needs to go to the [facility] with nursing.         If plan is discharge home, recommend the following: A little help with walking and/or transfers;A little help with bathing/dressing/bathroom;Assistance with cooking/housework;Help with stairs or ramp for entrance   Can travel by private vehicle        Equipment Recommendations Rolling walker (2 wheels)  Recommendations for Other Services       Functional Status Assessment Patient has had a recent decline in their functional status and demonstrates the ability to make significant improvements in function in a reasonable and predictable amount of time.     Precautions / Restrictions Precautions Precautions: Fall;Knee Recall of Precautions/Restrictions: Intact Restrictions Weight Bearing Restrictions Per Provider Order: No      Mobility  Bed Mobility Overal bed mobility: Needs Assistance Bed Mobility: Supine to Sit     Supine to sit: Contact guard          Transfers Overall transfer level: Needs assistance Equipment used: Rolling walker (2 wheels) Transfers: Sit to/from Stand Sit to Stand: Contact guard assist           General transfer comment: cues for safety, use of Rw    Ambulation/Gait Ambulation/Gait assistance: Min assist Gait Distance (Feet): 60 Feet Assistive device: Rolling walker (2 wheels) Gait Pattern/deviations: Step-to pattern, Step-through pattern       General Gait Details: cues for sequence and position inside Rw  Stairs             Wheelchair Mobility     Tilt Bed    Modified Rankin (Stroke Patients Only)       Balance Overall balance assessment: Mild deficits observed, not formally tested                                           Pertinent Vitals/Pain Pain Assessment Pain Assessment: No/denies pain    Home Living Family/patient expects to be discharged to:: Private residence Living Arrangements: Spouse/significant other Available Help at Discharge: Family Type of Home: House Home Access: Level entry         Home Equipment: None      Prior Function Prior Level of Function : Independent/Modified Independent                     Extremity/Trunk Assessment   Upper Extremity Assessment Upper Extremity Assessment: Overall WFL for tasks assessed    Lower Extremity Assessment Lower Extremity Assessment: RLE deficits/detail RLE Deficits / Details: + SLR, 0-65* knee flex    Cervical / Trunk Assessment Cervical / Trunk Assessment: Normal  Communication   Communication Communication: No apparent difficulties    Cognition Arousal: Alert Behavior During Therapy: WFL for tasks assessed/performed   PT - Cognitive impairments: No apparent impairments                         Following  commands: Intact       Cueing       General Comments      Exercises Total Joint Exercises Ankle Circles/Pumps: AROM, Both, 10 reps Quad Sets: AROM, Both, 10 reps Hip ABduction/ADduction: AROM, Right, 10 reps Straight Leg Raises: AROM, Right, 10 reps Long Arc Quad: AROM, Right, 10 reps Knee Flexion: AROM, 10 reps, Right   Assessment/Plan    PT Assessment Patient needs continued PT services  PT Problem List Decreased strength;Decreased range of motion;Decreased activity tolerance;Decreased mobility       PT Treatment Interventions DME instruction;Gait training;Functional mobility training;Therapeutic activities;Therapeutic exercise    PT Goals  (Current goals can be found in the Care Plan section)  Acute Rehab PT Goals Patient Stated Goal: go home PT Goal Formulation: All assessment and education complete, DC therapy    Frequency 7X/week     Co-evaluation               AM-PAC PT 6 Clicks Mobility  Outcome Measure Help needed turning from your back to your side while in a flat bed without using bedrails?: None Help needed moving from lying on your back to sitting on the side of a flat bed without using bedrails?: A Little Help needed moving to and from a bed to a chair (including a wheelchair)?: A Little Help needed standing up from a chair using your arms (e.g., wheelchair or bedside chair)?: A Little Help needed to walk in hospital room?: A Little Help needed climbing 3-5 steps with a railing? : A Little 6 Click Score: 19    End of Session Equipment Utilized During Treatment: Gait belt Activity Tolerance: Patient tolerated treatment well Patient left: in chair;with call bell/phone within reach;with family/visitor present Nurse Communication: Mobility status (dizziness when ambulating) PT Visit Diagnosis: Unsteadiness on feet (R26.81);Other abnormalities of gait and mobility (R26.89);Muscle weakness (generalized) (M62.81);Dizziness and giddiness (R42)    Time: 8681-8651 PT Time Calculation (min) (ACUTE ONLY): 30 min   Charges:   PT Evaluation $PT Eval Low Complexity: 1 Low PT Treatments $Gait Training: 8-22 mins PT General Charges $$ ACUTE PT VISIT: 1 Visit         Kristy [name] PT Acute Rehabilitation Services Office [phone]   Kristy [name] 07/07/2024, 2:29 PM

## 2024-07-07 NOTE — Anesthesia Procedure Notes (Signed)
 Spinal  Patient location during procedure: OR Start time: 07/07/2024 7:27 AM End time: 07/07/2024 7:32 AM Reason for block: surgical anesthesia Staffing Performed: anesthesiologist  Anesthesiologist: Epifanio Fallow, MD Performed by: Epifanio Fallow, MD Authorized by: Epifanio Fallow, MD   Preanesthetic Checklist Completed: patient identified, IV checked, risks and benefits discussed, surgical consent, monitors and equipment checked, pre-op evaluation and timeout performed Spinal Block Patient position: sitting Prep: DuraPrep Patient monitoring: cardiac monitor, continuous pulse ox and blood pressure Approach: midline Location: L3-4 Injection technique: single-shot Needle Needle type: Pencan  Needle gauge: 24 G Needle length: 9 cm Assessment Sensory level: T8 Events: CSF return Additional Notes Functioning IV was confirmed and monitors were applied. Sterile prep and drape, including hand hygiene and sterile gloves were used. The patient was positioned and the spine was prepped. The skin was anesthetized with lidocaine .  Free flow of clear CSF was obtained prior to injecting local anesthetic into the CSF.  The spinal needle aspirated freely following injection.  The needle was carefully withdrawn.  The patient tolerated the procedure well.

## 2024-07-08 ENCOUNTER — Encounter (HOSPITAL_COMMUNITY): Payer: Self-pay | Admitting: Orthopedic Surgery

## 2024-07-09 DIAGNOSIS — D0511 Intraductal carcinoma in situ of right breast: Secondary | ICD-10-CM | POA: Diagnosis not present

## 2024-07-09 DIAGNOSIS — I129 Hypertensive chronic kidney disease with stage 1 through stage 4 chronic kidney disease, or unspecified chronic kidney disease: Secondary | ICD-10-CM | POA: Diagnosis not present

## 2024-07-09 DIAGNOSIS — H8110 Benign paroxysmal vertigo, unspecified ear: Secondary | ICD-10-CM | POA: Diagnosis not present

## 2024-07-09 DIAGNOSIS — Z471 Aftercare following joint replacement surgery: Secondary | ICD-10-CM | POA: Diagnosis not present

## 2024-07-09 DIAGNOSIS — M19071 Primary osteoarthritis, right ankle and foot: Secondary | ICD-10-CM | POA: Diagnosis not present

## 2024-07-09 DIAGNOSIS — M810 Age-related osteoporosis without current pathological fracture: Secondary | ICD-10-CM | POA: Diagnosis not present

## 2024-07-09 DIAGNOSIS — Z96651 Presence of right artificial knee joint: Secondary | ICD-10-CM | POA: Diagnosis not present

## 2024-07-09 DIAGNOSIS — I739 Peripheral vascular disease, unspecified: Secondary | ICD-10-CM | POA: Diagnosis not present

## 2024-07-09 DIAGNOSIS — E785 Hyperlipidemia, unspecified: Secondary | ICD-10-CM | POA: Diagnosis not present

## 2024-07-09 DIAGNOSIS — Z791 Long term (current) use of non-steroidal anti-inflammatories (NSAID): Secondary | ICD-10-CM | POA: Diagnosis not present

## 2024-07-09 DIAGNOSIS — I779 Disorder of arteries and arterioles, unspecified: Secondary | ICD-10-CM | POA: Diagnosis not present

## 2024-07-09 DIAGNOSIS — Z87891 Personal history of nicotine dependence: Secondary | ICD-10-CM | POA: Diagnosis not present

## 2024-07-09 DIAGNOSIS — N189 Chronic kidney disease, unspecified: Secondary | ICD-10-CM | POA: Diagnosis not present

## 2024-07-09 DIAGNOSIS — M5116 Intervertebral disc disorders with radiculopathy, lumbar region: Secondary | ICD-10-CM | POA: Diagnosis not present

## 2024-07-11 DIAGNOSIS — M810 Age-related osteoporosis without current pathological fracture: Secondary | ICD-10-CM | POA: Diagnosis not present

## 2024-07-11 DIAGNOSIS — D0511 Intraductal carcinoma in situ of right breast: Secondary | ICD-10-CM | POA: Diagnosis not present

## 2024-07-11 DIAGNOSIS — I129 Hypertensive chronic kidney disease with stage 1 through stage 4 chronic kidney disease, or unspecified chronic kidney disease: Secondary | ICD-10-CM | POA: Diagnosis not present

## 2024-07-11 DIAGNOSIS — Z791 Long term (current) use of non-steroidal anti-inflammatories (NSAID): Secondary | ICD-10-CM | POA: Diagnosis not present

## 2024-07-11 DIAGNOSIS — Z87891 Personal history of nicotine dependence: Secondary | ICD-10-CM | POA: Diagnosis not present

## 2024-07-11 DIAGNOSIS — Z471 Aftercare following joint replacement surgery: Secondary | ICD-10-CM | POA: Diagnosis not present

## 2024-07-11 DIAGNOSIS — N189 Chronic kidney disease, unspecified: Secondary | ICD-10-CM | POA: Diagnosis not present

## 2024-07-11 DIAGNOSIS — H8110 Benign paroxysmal vertigo, unspecified ear: Secondary | ICD-10-CM | POA: Diagnosis not present

## 2024-07-11 DIAGNOSIS — Z96651 Presence of right artificial knee joint: Secondary | ICD-10-CM | POA: Diagnosis not present

## 2024-07-11 DIAGNOSIS — M19071 Primary osteoarthritis, right ankle and foot: Secondary | ICD-10-CM | POA: Diagnosis not present

## 2024-07-11 DIAGNOSIS — M5116 Intervertebral disc disorders with radiculopathy, lumbar region: Secondary | ICD-10-CM | POA: Diagnosis not present

## 2024-07-11 DIAGNOSIS — E785 Hyperlipidemia, unspecified: Secondary | ICD-10-CM | POA: Diagnosis not present

## 2024-07-11 DIAGNOSIS — I739 Peripheral vascular disease, unspecified: Secondary | ICD-10-CM | POA: Diagnosis not present

## 2024-07-11 DIAGNOSIS — I779 Disorder of arteries and arterioles, unspecified: Secondary | ICD-10-CM | POA: Diagnosis not present

## 2024-07-14 DIAGNOSIS — M5116 Intervertebral disc disorders with radiculopathy, lumbar region: Secondary | ICD-10-CM | POA: Diagnosis not present

## 2024-07-14 DIAGNOSIS — Z87891 Personal history of nicotine dependence: Secondary | ICD-10-CM | POA: Diagnosis not present

## 2024-07-14 DIAGNOSIS — I129 Hypertensive chronic kidney disease with stage 1 through stage 4 chronic kidney disease, or unspecified chronic kidney disease: Secondary | ICD-10-CM | POA: Diagnosis not present

## 2024-07-14 DIAGNOSIS — I739 Peripheral vascular disease, unspecified: Secondary | ICD-10-CM | POA: Diagnosis not present

## 2024-07-14 DIAGNOSIS — I779 Disorder of arteries and arterioles, unspecified: Secondary | ICD-10-CM | POA: Diagnosis not present

## 2024-07-14 DIAGNOSIS — D0511 Intraductal carcinoma in situ of right breast: Secondary | ICD-10-CM | POA: Diagnosis not present

## 2024-07-14 DIAGNOSIS — Z471 Aftercare following joint replacement surgery: Secondary | ICD-10-CM | POA: Diagnosis not present

## 2024-07-14 DIAGNOSIS — H8110 Benign paroxysmal vertigo, unspecified ear: Secondary | ICD-10-CM | POA: Diagnosis not present

## 2024-07-14 DIAGNOSIS — M19071 Primary osteoarthritis, right ankle and foot: Secondary | ICD-10-CM | POA: Diagnosis not present

## 2024-07-14 DIAGNOSIS — N189 Chronic kidney disease, unspecified: Secondary | ICD-10-CM | POA: Diagnosis not present

## 2024-07-14 DIAGNOSIS — E785 Hyperlipidemia, unspecified: Secondary | ICD-10-CM | POA: Diagnosis not present

## 2024-07-14 DIAGNOSIS — Z96651 Presence of right artificial knee joint: Secondary | ICD-10-CM | POA: Diagnosis not present

## 2024-07-14 DIAGNOSIS — Z791 Long term (current) use of non-steroidal anti-inflammatories (NSAID): Secondary | ICD-10-CM | POA: Diagnosis not present

## 2024-07-14 DIAGNOSIS — M810 Age-related osteoporosis without current pathological fracture: Secondary | ICD-10-CM | POA: Diagnosis not present

## 2024-07-16 DIAGNOSIS — Z96651 Presence of right artificial knee joint: Secondary | ICD-10-CM | POA: Diagnosis not present

## 2024-07-16 DIAGNOSIS — I129 Hypertensive chronic kidney disease with stage 1 through stage 4 chronic kidney disease, or unspecified chronic kidney disease: Secondary | ICD-10-CM | POA: Diagnosis not present

## 2024-07-16 DIAGNOSIS — M5116 Intervertebral disc disorders with radiculopathy, lumbar region: Secondary | ICD-10-CM | POA: Diagnosis not present

## 2024-07-16 DIAGNOSIS — H8110 Benign paroxysmal vertigo, unspecified ear: Secondary | ICD-10-CM | POA: Diagnosis not present

## 2024-07-16 DIAGNOSIS — I779 Disorder of arteries and arterioles, unspecified: Secondary | ICD-10-CM | POA: Diagnosis not present

## 2024-07-16 DIAGNOSIS — I739 Peripheral vascular disease, unspecified: Secondary | ICD-10-CM | POA: Diagnosis not present

## 2024-07-16 DIAGNOSIS — Z87891 Personal history of nicotine dependence: Secondary | ICD-10-CM | POA: Diagnosis not present

## 2024-07-16 DIAGNOSIS — M19071 Primary osteoarthritis, right ankle and foot: Secondary | ICD-10-CM | POA: Diagnosis not present

## 2024-07-16 DIAGNOSIS — N189 Chronic kidney disease, unspecified: Secondary | ICD-10-CM | POA: Diagnosis not present

## 2024-07-16 DIAGNOSIS — E785 Hyperlipidemia, unspecified: Secondary | ICD-10-CM | POA: Diagnosis not present

## 2024-07-16 DIAGNOSIS — D0511 Intraductal carcinoma in situ of right breast: Secondary | ICD-10-CM | POA: Diagnosis not present

## 2024-07-16 DIAGNOSIS — Z471 Aftercare following joint replacement surgery: Secondary | ICD-10-CM | POA: Diagnosis not present

## 2024-07-16 DIAGNOSIS — Z791 Long term (current) use of non-steroidal anti-inflammatories (NSAID): Secondary | ICD-10-CM | POA: Diagnosis not present

## 2024-07-16 DIAGNOSIS — M810 Age-related osteoporosis without current pathological fracture: Secondary | ICD-10-CM | POA: Diagnosis not present

## 2024-07-18 DIAGNOSIS — E785 Hyperlipidemia, unspecified: Secondary | ICD-10-CM | POA: Diagnosis not present

## 2024-07-18 DIAGNOSIS — I129 Hypertensive chronic kidney disease with stage 1 through stage 4 chronic kidney disease, or unspecified chronic kidney disease: Secondary | ICD-10-CM | POA: Diagnosis not present

## 2024-07-18 DIAGNOSIS — M5116 Intervertebral disc disorders with radiculopathy, lumbar region: Secondary | ICD-10-CM | POA: Diagnosis not present

## 2024-07-18 DIAGNOSIS — N189 Chronic kidney disease, unspecified: Secondary | ICD-10-CM | POA: Diagnosis not present

## 2024-07-18 DIAGNOSIS — D0511 Intraductal carcinoma in situ of right breast: Secondary | ICD-10-CM | POA: Diagnosis not present

## 2024-07-18 DIAGNOSIS — I739 Peripheral vascular disease, unspecified: Secondary | ICD-10-CM | POA: Diagnosis not present

## 2024-07-18 DIAGNOSIS — Z471 Aftercare following joint replacement surgery: Secondary | ICD-10-CM | POA: Diagnosis not present

## 2024-07-18 DIAGNOSIS — M19071 Primary osteoarthritis, right ankle and foot: Secondary | ICD-10-CM | POA: Diagnosis not present

## 2024-07-18 DIAGNOSIS — H8110 Benign paroxysmal vertigo, unspecified ear: Secondary | ICD-10-CM | POA: Diagnosis not present

## 2024-07-18 DIAGNOSIS — Z87891 Personal history of nicotine dependence: Secondary | ICD-10-CM | POA: Diagnosis not present

## 2024-07-18 DIAGNOSIS — I779 Disorder of arteries and arterioles, unspecified: Secondary | ICD-10-CM | POA: Diagnosis not present

## 2024-07-18 DIAGNOSIS — M810 Age-related osteoporosis without current pathological fracture: Secondary | ICD-10-CM | POA: Diagnosis not present

## 2024-07-18 DIAGNOSIS — Z791 Long term (current) use of non-steroidal anti-inflammatories (NSAID): Secondary | ICD-10-CM | POA: Diagnosis not present

## 2024-07-18 DIAGNOSIS — Z96651 Presence of right artificial knee joint: Secondary | ICD-10-CM | POA: Diagnosis not present

## 2024-07-20 DIAGNOSIS — Z791 Long term (current) use of non-steroidal anti-inflammatories (NSAID): Secondary | ICD-10-CM | POA: Diagnosis not present

## 2024-07-20 DIAGNOSIS — E785 Hyperlipidemia, unspecified: Secondary | ICD-10-CM | POA: Diagnosis not present

## 2024-07-20 DIAGNOSIS — I779 Disorder of arteries and arterioles, unspecified: Secondary | ICD-10-CM | POA: Diagnosis not present

## 2024-07-20 DIAGNOSIS — D0511 Intraductal carcinoma in situ of right breast: Secondary | ICD-10-CM | POA: Diagnosis not present

## 2024-07-20 DIAGNOSIS — I739 Peripheral vascular disease, unspecified: Secondary | ICD-10-CM | POA: Diagnosis not present

## 2024-07-20 DIAGNOSIS — N189 Chronic kidney disease, unspecified: Secondary | ICD-10-CM | POA: Diagnosis not present

## 2024-07-20 DIAGNOSIS — M5116 Intervertebral disc disorders with radiculopathy, lumbar region: Secondary | ICD-10-CM | POA: Diagnosis not present

## 2024-07-20 DIAGNOSIS — H8110 Benign paroxysmal vertigo, unspecified ear: Secondary | ICD-10-CM | POA: Diagnosis not present

## 2024-07-20 DIAGNOSIS — Z96651 Presence of right artificial knee joint: Secondary | ICD-10-CM | POA: Diagnosis not present

## 2024-07-20 DIAGNOSIS — Z471 Aftercare following joint replacement surgery: Secondary | ICD-10-CM | POA: Diagnosis not present

## 2024-07-20 DIAGNOSIS — M810 Age-related osteoporosis without current pathological fracture: Secondary | ICD-10-CM | POA: Diagnosis not present

## 2024-07-20 DIAGNOSIS — M19071 Primary osteoarthritis, right ankle and foot: Secondary | ICD-10-CM | POA: Diagnosis not present

## 2024-07-20 DIAGNOSIS — I129 Hypertensive chronic kidney disease with stage 1 through stage 4 chronic kidney disease, or unspecified chronic kidney disease: Secondary | ICD-10-CM | POA: Diagnosis not present

## 2024-07-20 DIAGNOSIS — Z87891 Personal history of nicotine dependence: Secondary | ICD-10-CM | POA: Diagnosis not present

## 2024-07-22 DIAGNOSIS — R262 Difficulty in walking, not elsewhere classified: Secondary | ICD-10-CM | POA: Diagnosis not present

## 2024-07-22 DIAGNOSIS — M25661 Stiffness of right knee, not elsewhere classified: Secondary | ICD-10-CM | POA: Diagnosis not present

## 2024-07-22 DIAGNOSIS — M6281 Muscle weakness (generalized): Secondary | ICD-10-CM | POA: Diagnosis not present

## 2024-07-22 DIAGNOSIS — M1711 Unilateral primary osteoarthritis, right knee: Secondary | ICD-10-CM | POA: Diagnosis not present

## 2024-07-22 NOTE — Addendum Note (Signed)
 Addendum  created 07/22/24 1223 by Augusta Daved SAILOR, CRNA   Intraprocedure Event edited

## 2024-07-28 ENCOUNTER — Telehealth: Payer: Self-pay

## 2024-07-28 NOTE — Telephone Encounter (Signed)
Unable to reach patient. Unable to leave voicemail.  

## 2024-07-28 NOTE — Telephone Encounter (Signed)
 Copied from CRM [id number]. Topic: Clinical - Medication Question >> Jul 28, 2024  2:31 PM Thersia BROCKS wrote: Reason for CRM: Patient called in wanting to speak with Nurse Arland, has knee replacement surgery , has questions about her blood pressure medication   [phone]

## 2024-07-29 ENCOUNTER — Ambulatory Visit: Payer: Self-pay

## 2024-07-29 NOTE — Telephone Encounter (Signed)
 FYI Only or Action Required?: FYI only for provider.  Patient was last seen in primary care on 06/05/2024 by Avelina Greig BRAVO, MD.  Called Nurse Triage reporting Dizziness.  Symptoms began yesterday.  Interventions attempted: Other: stopped tramadol .  Symptoms are: gradually improving.  Triage Disposition: See Physician Within 24 Hours  Patient/caregiver understands and will follow disposition?: Yes   Copied from CRM [id number]. Topic: Clinical - Red Word Triage >> Jul 29, 2024  9:04 AM Mesmerise C wrote: [facility] that prompted transfer to Nurse Triage: Patient stated she's feeling dizzy and light headed all day yesterday and this morning Reason for Disposition  [1] MODERATE dizziness (e.g., interferes with normal activities) AND [2] has NOT been evaluated by doctor (or NP/PA) for this  (Exception: Dizziness caused by heat exposure, sudden standing, or poor fluid intake.)  Answer Assessment - Initial Assessment Questions Additional info: On tramadol  from knee replacement. She has noticed her blood pressure was dropping so she stopped Tramadol  and changed to Tylenol . Stopping tramadol  improved her blood pressure 155/54, 66 but she is still feeling woozy at times.     1. DESCRIPTION: Describe your dizziness.     lightheaded 2. LIGHTHEADED: Do you feel lightheaded? (e.g., somewhat faint, woozy, weak upon standing)     Woozy, off balance 3. VERTIGO: Do you feel like either you or the room is spinning or tilting? (i.e., vertigo)     Off balance 4. SEVERITY: How bad is it?  Do you feel like you are going to faint? Can you stand and walk?     No 5. ONSET:  When did the dizziness begin?     yesterday 6. AGGRAVATING FACTORS: Does anything make it worse? (e.g., standing, change in head position)     no 7. HEART RATE: Can you tell me your heart rate? How many beats in 15 seconds?  (Note: Not all patients can do this.)       66 8. CAUSE: What do you think is causing the  dizziness? (e.g., decreased fluids or food, diarrhea, emotional distress, heat exposure, new medicine, sudden standing, vomiting; unknown)     Medication-Tramadol  9. RECURRENT SYMPTOM: Have you had dizziness before? If Yes, ask: When was the last time? What happened that time?     no 10. OTHER SYMPTOMS: Do you have any other symptoms? (e.g., fever, chest pain, vomiting, diarrhea, bleeding)       Pain from knee replacement  Protocols used: Dizziness - Lightheadedness-A-AH

## 2024-07-29 NOTE — Telephone Encounter (Signed)
Patient has an appointment scheduled with me tomorrow.

## 2024-07-30 ENCOUNTER — Encounter: Payer: Self-pay | Admitting: Family Medicine

## 2024-07-30 ENCOUNTER — Telehealth: Payer: Self-pay

## 2024-07-30 ENCOUNTER — Ambulatory Visit (INDEPENDENT_AMBULATORY_CARE_PROVIDER_SITE_OTHER): Admitting: Family Medicine

## 2024-07-30 VITALS — BP 142/80 | HR 80 | Temp 98.8°F | Ht 61.5 in | Wt 169.4 lb

## 2024-07-30 DIAGNOSIS — N61 Mastitis without abscess: Secondary | ICD-10-CM | POA: Diagnosis not present

## 2024-07-30 DIAGNOSIS — R42 Dizziness and giddiness: Secondary | ICD-10-CM

## 2024-07-30 DIAGNOSIS — D0511 Intraductal carcinoma in situ of right breast: Secondary | ICD-10-CM

## 2024-07-30 DIAGNOSIS — R7989 Other specified abnormal findings of blood chemistry: Secondary | ICD-10-CM | POA: Diagnosis not present

## 2024-07-30 DIAGNOSIS — R5383 Other fatigue: Secondary | ICD-10-CM

## 2024-07-30 LAB — COMPREHENSIVE METABOLIC PANEL WITH GFR
ALT: 11 U/L (ref 0–35)
AST: 15 U/L (ref 0–37)
Albumin: 4.2 g/dL (ref 3.5–5.2)
Alkaline Phosphatase: 68 U/L (ref 39–117)
BUN: 14 mg/dL (ref 6–23)
CO2: 29 meq/L (ref 19–32)
Calcium: 10.3 mg/dL (ref 8.4–10.5)
Chloride: 101 meq/L (ref 96–112)
Creatinine, Ser: 0.88 mg/dL (ref 0.40–1.20)
GFR: 61.74 mL/min (ref >60.00)
Glucose, Bld: 98 mg/dL (ref 70–99)
Potassium: 3.6 meq/L (ref 3.5–5.1)
Sodium: 138 meq/L (ref 135–145)
Total Bilirubin: 0.4 mg/dL (ref 0.2–1.2)
Total Protein: 7.1 g/dL (ref 6.0–8.3)

## 2024-07-30 LAB — CBC WITH DIFFERENTIAL/PLATELET
Basophils Absolute: 0 K/uL (ref 0.0–0.1)
Basophils Relative: 0.9 % (ref 0.0–3.0)
Eosinophils Absolute: 0.2 K/uL (ref 0.0–0.7)
Eosinophils Relative: 3.3 % (ref 0.0–5.0)
HCT: 32.3 % — ABNORMAL LOW (ref 36.0–46.0)
Hemoglobin: 10.9 g/dL — ABNORMAL LOW (ref 12.0–15.0)
Lymphocytes Relative: 37.3 % (ref 12.0–46.0)
Lymphs Abs: 1.9 K/uL (ref 0.7–4.0)
MCHC: 33.6 g/dL (ref 30.0–36.0)
MCV: 95 fl (ref 78.0–100.0)
Monocytes Absolute: 0.4 K/uL (ref 0.1–1.0)
Monocytes Relative: 8 % (ref 3.0–12.0)
Neutro Abs: 2.6 K/uL (ref 1.4–7.7)
Neutrophils Relative %: 50.5 % (ref 43.0–77.0)
Platelets: 281 K/uL (ref 150.0–400.0)
RBC: 3.4 Mil/uL — ABNORMAL LOW (ref 3.87–5.11)
RDW: 12.8 % (ref 11.5–15.5)
WBC: 5.1 K/uL (ref 4.0–10.5)

## 2024-07-30 LAB — MAGNESIUM: Magnesium: 1.7 mg/dL (ref 1.5–2.5)

## 2024-07-30 LAB — VITAMIN D 25 HYDROXY (VIT D DEFICIENCY, FRACTURES): VITD: 33.88 ng/mL (ref 30.00–100.00)

## 2024-07-30 LAB — TSH: TSH: 1.56 u[IU]/mL (ref 0.35–5.50)

## 2024-07-30 LAB — VITAMIN B12: Vitamin B-12: 204 pg/mL — ABNORMAL LOW (ref 211–911)

## 2024-07-30 MED ORDER — CEFADROXIL 500 MG PO CAPS: 500.0000 mg | ORAL_CAPSULE | Freq: Two times a day (BID) | ORAL | 0 refills | Status: DC

## 2024-07-30 NOTE — Assessment & Plan Note (Signed)
 Acute, there are multiple possible reasons for recent new dizziness following recent surgery on right knee. Will evaluate with labs for electrolyte abnormality, new liver, kidney or thyroid  problem.  Will evaluate for anemia. Also dizziness may be related to possible ongoing right breast mastitis.

## 2024-07-30 NOTE — Assessment & Plan Note (Signed)
 Acute worsening.  Will evaluate with lab for possible secondary cause.

## 2024-07-30 NOTE — Assessment & Plan Note (Signed)
 Acute, potentially contributing to feeling poorly.  No red flags such as fever noted.  Patient will draw marker line to follow spread. Will start on cefadroxil  500 mg twice daily x 5 days as patient did not tolerate Keflex  or doxycycline  in the past for similar issue.  Per oncology note last year this was helpful in treating. Will also evaluate with mammogram and ultrasound, diagnostic given breast changes to rule out underlying mass/inflammatory breast cancer.  I have encouraged her to follow-up with her oncologist Dr. Loretha..  Via phone and possibly with office visit.    Return and ER precautions provided.  Patient will follow-up in 1 week for reassessment.

## 2024-07-30 NOTE — Patient Instructions (Signed)
 Call to get a follow up with Dr. Loretha.

## 2024-07-30 NOTE — Telephone Encounter (Signed)
 Pt called to let us  know she is experiencing right breast redness. She reached out to her PCP and was seen today. She is being treated for cellulitis and a dx MM/US  has been ordered by her PCP as well. Kristy [name] is scheduled for the MM 9/18. Advised I would make Dr Loretha aware. She knows to call with any further concerns.

## 2024-07-30 NOTE — Progress Notes (Signed)
 Patient ID: Kristy [name], female    DOB: Apr 05, 1941, 83 y.o.   MRN: [id number]  This visit was conducted in person.  BP (!) 142/80   Pulse 80   Temp 98.8 F (37.1 C) (Oral)   Ht 5' 1.5 (1.562 m)   Wt 169 lb 6.4 oz (76.8 kg)   SpO2 100%   BMI 31.49 kg/m    CC:  Chief Complaint  Patient presents with   Dizziness    She believes that when she was taking tramadol  that it was causing her BP to drop which made her dizzy. She since has stopped the tramadol  the lowest 99/54.    Fatigue    Just does not feel good overall    Subjective:   HPI: Kristy [name] is a 83 y.o. female presenting on 07/30/2024 for Dizziness (She believes that when she was taking tramadol  that it was causing her BP to drop which made her dizzy. She since has stopped the tramadol  the lowest 99/54. ) and Fatigue (Just does not feel good overall)   Recent total knee arthroplasty.. 07/07/2024 treated with tramadol  for pain.  Started PT 9/2. Followed up   Recently noted dizziness... felt oxycodone  and  tramadol  was causing her BP to drop. Lowest  99/54  She has stopped  pain med now in last 3 days... no further BP lows but still tired and dizzy.  At home BP now running up and down... occ high with pain. BP Readings from Last 3 Encounters:  07/30/24 (!) 142/80  07/07/24 (!) 131/57  06/18/24 (!) 140/67     Feeling tired overall.  Hot and cold chills.. no fever.  No heart racing, no CP, no SOB.  Feeling shaky.  Take meds regularly Hx of noninvasive breast cancer, right breast  On tamoxifem  since 2022.. held for surgery. 11/2022 breast cellulitis per ONC Dr. Loretha.. unable to tolerate doxy.. changed to cefadroxil  twice a day for 5 days.   Noted this AM red ness in right breast, no pain, no nipple discharge no mass.    No redness and no new swelling in knee  at surgical site. Pain decreasing in knee overtime     Relevant past medical, surgical, family and social history reviewed and updated as  indicated. Interim medical history since our last visit reviewed. Allergies and medications reviewed and updated. Outpatient Medications Prior to Visit  Medication Sig Dispense Refill   acetaminophen  (TYLENOL ) 500 MG tablet Take 2 tablets (1,000 mg total) by mouth every 8 (eight) hours as needed.     aspirin  EC 81 MG tablet Take 1 tablet (81 mg total) by mouth 2 (two) times daily for 28 days. Swallow whole.     atorvastatin  (LIPITOR) 20 MG tablet TAKE 1 TABLET BY MOUTH EVERY DAY 90 tablet 3   losartan  (COZAAR ) 50 MG tablet TAKE 1 TABLET BY MOUTH EVERY DAY 90 tablet 0   metoprolol  succinate (TOPROL -XL) 100 MG 24 hr tablet TAKE 1 TABLET BY MOUTH DAILY  WITH OR IMMEDIATELY FOLLOWING A  MEAL 100 tablet 3   Multiple Vitamin (MULTIVITAMIN WITH MINERALS) TABS tablet Take 2 tablets by mouth daily.     sodium chloride  (OCEAN) 0.65 % SOLN nasal spray Place 1 spray into both nostrils every 4 (four) hours as needed for congestion.     tamoxifen  (NOLVADEX ) 20 MG tablet Take 1 tablet (20 mg total) by mouth daily. 90 tablet 3   triamterene -hydrochlorothiazide (MAXZIDE-25) 37.5-25 MG tablet TAKE 1 TABLET BY  MOUTH DAILY 100 tablet 2   traMADol  (ULTRAM ) 50 MG tablet Take 50 mg by mouth every 6 (six) hours as needed. (Patient not taking: Reported on 07/30/2024)     omeprazole  (PRILOSEC) 40 MG capsule Take 1 capsule (40 mg total) by mouth daily for 21 days. 21 capsule 0   polyethylene glycol (MIRALAX ) 17 g packet Take 17 g by mouth daily. 14 each 0   valACYclovir  (VALTREX ) 500 MG tablet Take 1 tablet (500 mg total) by mouth 2 (two) times daily. (Patient not taking: Reported on 06/17/2024) 30 tablet 2   No facility-administered medications prior to visit.     Per HPI unless specifically indicated in ROS section below Review of Systems  Constitutional:  Positive for chills and fatigue. Negative for fever.  HENT:  Negative for congestion.   Eyes:  Negative for pain.  Respiratory:  Negative for cough and shortness  of breath.   Cardiovascular:  Negative for chest pain, palpitations and leg swelling.  Gastrointestinal:  Negative for abdominal pain.  Genitourinary:  Negative for dysuria and vaginal bleeding.  Musculoskeletal:  Negative for back pain.  Skin:  Positive for rash.  Neurological:  Positive for dizziness. Negative for syncope, light-headedness and headaches.  Psychiatric/Behavioral:  Negative for dysphoric mood.    Objective:  BP (!) 142/80   Pulse 80   Temp 98.8 F (37.1 C) (Oral)   Ht 5' 1.5 (1.562 m)   Wt 169 lb 6.4 oz (76.8 kg)   SpO2 100%   BMI 31.49 kg/m   Wt Readings from Last 3 Encounters:  07/30/24 169 lb 6.4 oz (76.8 kg)  07/07/24 169 lb 12.1 oz (77 kg)  06/18/24 170 lb 13.7 oz (77.5 kg)      Physical Exam Constitutional:      General: She is not in acute distress.    Appearance: Normal appearance. She is well-developed. She is not ill-appearing or toxic-appearing.  HENT:     Head: Normocephalic.     Right Ear: Hearing, tympanic membrane, ear canal and external ear normal. Tympanic membrane is not erythematous, retracted or bulging.     Left Ear: Hearing, tympanic membrane, ear canal and external ear normal. Tympanic membrane is not erythematous, retracted or bulging.     Nose: No mucosal edema or rhinorrhea.     Right Sinus: No maxillary sinus tenderness or frontal sinus tenderness.     Left Sinus: No maxillary sinus tenderness or frontal sinus tenderness.     Mouth/Throat:     Pharynx: Uvula midline.  Eyes:     General: Lids are normal. Lids are everted, no foreign bodies appreciated.     Conjunctiva/sclera: Conjunctivae normal.     Pupils: Pupils are equal, round, and reactive to light.  Neck:     Thyroid : No thyroid  mass or thyromegaly.     Vascular: No carotid bruit.     Trachea: Trachea normal.  Cardiovascular:     Rate and Rhythm: Normal rate and regular rhythm.     Pulses: Normal pulses.     Heart sounds: Normal heart sounds, S1 normal and S2 normal.  No murmur heard.    No friction rub. No gallop.  Pulmonary:     Effort: Pulmonary effort is normal. No tachypnea or respiratory distress.     Breath sounds: Normal breath sounds. No decreased breath sounds, wheezing, rhonchi or rales.  Chest:       Comments: Diffuse erythema of right breast, past scarring due to lumpectomy, no focal mass, no  tenderness Abdominal:     General: Bowel sounds are normal.     Palpations: Abdomen is soft.     Tenderness: There is no abdominal tenderness.  Musculoskeletal:     Cervical back: Normal range of motion and neck supple.  Lymphadenopathy:     Upper Body:     Right upper body: No axillary or pectoral adenopathy.     Left upper body: No axillary or pectoral adenopathy.  Skin:    General: Skin is warm and dry.     Findings: No rash.  Neurological:     Mental Status: She is alert.  Psychiatric:        Mood and Affect: Mood is not anxious or depressed.        Speech: Speech normal.        Behavior: Behavior normal. Behavior is cooperative.        Thought Content: Thought content normal.        Judgment: Judgment normal.       Results for orders placed or performed during the hospital encounter of 07/07/24  Type and screen [facility]   Collection Time: 07/07/24  6:36 AM  Result Value Ref Range   ABO/RH(D) A POS    Antibody Screen NEG    Sample Expiration      07/10/2024,2359 Performed at [facility], [address] [place], [place] [address]     Assessment and Plan  Dizziness Assessment & Plan: Acute, there are multiple possible reasons for recent new dizziness following recent surgery on right knee. Will evaluate with labs for electrolyte abnormality, new liver, kidney or thyroid  problem.  Will evaluate for anemia. Also dizziness may be related to possible ongoing right breast mastitis.  Orders: -     Magnesium  Other fatigue Assessment & Plan: Acute worsening.  Will evaluate with lab  for possible secondary cause.  Orders: -     CBC with Differential/Platelet -     Comprehensive metabolic panel with GFR -     TSH -     Vitamin B12 -     VITAMIN D  25 Hydroxy (Vit-D Deficiency, Fractures) -     Magnesium  Cellulitis of right breast Assessment & Plan: Acute, potentially contributing to feeling poorly.  No red flags such as fever noted.  Patient will draw marker line to follow spread. Will start on cefadroxil  500 mg twice daily x 5 days as patient did not tolerate Keflex  or doxycycline  in the past for similar issue.  Per oncology note last year this was helpful in treating. Will also evaluate with mammogram and ultrasound, diagnostic given breast changes to rule out underlying mass/inflammatory breast cancer.  I have encouraged her to follow-up with her oncologist Dr. Loretha..  Via phone and possibly with office visit.    Return and ER precautions provided.  Patient will follow-up in 1 week for reassessment.  Orders: -     US  LIMITED ULTRASOUND INCLUDING AXILLA RIGHT BREAST; Future -     US  LIMITED ULTRASOUND INCLUDING AXILLA LEFT BREAST ; Future -     MM 3D DIAGNOSTIC MAMMOGRAM BILATERAL BREAST; Future  Ductal carcinoma in situ (DCIS) of right breast Assessment & Plan: Currently actively treated with tamoxifen  although patient has been holding this for recent surgery.  Orders: -     US  LIMITED ULTRASOUND INCLUDING AXILLA RIGHT BREAST; Future -     US  LIMITED ULTRASOUND INCLUDING AXILLA LEFT BREAST ; Future -     MM  3D DIAGNOSTIC MAMMOGRAM BILATERAL BREAST; Future  Other orders -     Cefadroxil ; Take 1 capsule (500 mg total) by mouth 2 (two) times daily.  Dispense: 10 capsule; Refill: 0    Return in about 1 week (around 08/06/2024) for breast cellulitits.   Greig Ring, MD

## 2024-07-30 NOTE — Assessment & Plan Note (Signed)
 Currently actively treated with tamoxifen  although patient has been holding this for recent surgery.

## 2024-07-31 ENCOUNTER — Ambulatory Visit: Payer: Self-pay | Admitting: Family Medicine

## 2024-07-31 DIAGNOSIS — D649 Anemia, unspecified: Secondary | ICD-10-CM

## 2024-07-31 DIAGNOSIS — M25661 Stiffness of right knee, not elsewhere classified: Secondary | ICD-10-CM | POA: Diagnosis not present

## 2024-07-31 DIAGNOSIS — M1711 Unilateral primary osteoarthritis, right knee: Secondary | ICD-10-CM | POA: Diagnosis not present

## 2024-07-31 DIAGNOSIS — R262 Difficulty in walking, not elsewhere classified: Secondary | ICD-10-CM | POA: Diagnosis not present

## 2024-07-31 DIAGNOSIS — M6281 Muscle weakness (generalized): Secondary | ICD-10-CM | POA: Diagnosis not present

## 2024-07-31 NOTE — Telephone Encounter (Signed)
 Was seen in office by Dr. Avelina 07/30/2024.

## 2024-08-01 ENCOUNTER — Ambulatory Visit (INDEPENDENT_AMBULATORY_CARE_PROVIDER_SITE_OTHER)

## 2024-08-01 DIAGNOSIS — R5383 Other fatigue: Secondary | ICD-10-CM

## 2024-08-01 DIAGNOSIS — R7989 Other specified abnormal findings of blood chemistry: Secondary | ICD-10-CM | POA: Diagnosis not present

## 2024-08-01 DIAGNOSIS — R42 Dizziness and giddiness: Secondary | ICD-10-CM

## 2024-08-01 LAB — IBC + FERRITIN
Ferritin: 243.6 ng/mL (ref 10.0–291.0)
Iron: 93 ug/dL (ref 42–145)
Saturation Ratios: 26.1 % (ref 20.0–50.0)
TIBC: 357 ug/dL (ref 250.0–450.0)
Transferrin: 255 mg/dL (ref 212.0–360.0)

## 2024-08-01 MED ORDER — CYANOCOBALAMIN 1000 MCG/ML IJ SOLN: 1000.0000 ug | INTRAMUSCULAR | Status: DC

## 2024-08-01 NOTE — Addendum Note (Signed)
 Addended by: HOPE VEVA PARAS on: 08/01/2024 07:29 AM   Modules accepted: Orders

## 2024-08-05 ENCOUNTER — Other Ambulatory Visit: Payer: Self-pay | Admitting: Family Medicine

## 2024-08-05 DIAGNOSIS — D0511 Intraductal carcinoma in situ of right breast: Secondary | ICD-10-CM

## 2024-08-05 DIAGNOSIS — M1711 Unilateral primary osteoarthritis, right knee: Secondary | ICD-10-CM | POA: Diagnosis not present

## 2024-08-05 DIAGNOSIS — M25661 Stiffness of right knee, not elsewhere classified: Secondary | ICD-10-CM | POA: Diagnosis not present

## 2024-08-05 DIAGNOSIS — N61 Mastitis without abscess: Secondary | ICD-10-CM

## 2024-08-05 DIAGNOSIS — R262 Difficulty in walking, not elsewhere classified: Secondary | ICD-10-CM | POA: Diagnosis not present

## 2024-08-05 DIAGNOSIS — M6281 Muscle weakness (generalized): Secondary | ICD-10-CM | POA: Diagnosis not present

## 2024-08-06 ENCOUNTER — Ambulatory Visit (INDEPENDENT_AMBULATORY_CARE_PROVIDER_SITE_OTHER)

## 2024-08-06 DIAGNOSIS — E538 Deficiency of other specified B group vitamins: Secondary | ICD-10-CM

## 2024-08-06 MED ORDER — CYANOCOBALAMIN 1000 MCG/ML IJ SOLN
1000.0000 ug | Freq: Once | INTRAMUSCULAR | Status: AC
  Administered 2024-08-06: 1000 ug via INTRAMUSCULAR

## 2024-08-06 NOTE — Progress Notes (Signed)
 Per orders of Dr. Avelina, #1 of 3 monthly B12 1000 mcg/ml injection given by Clotilda SHAUNNA Pander, CMA in Left Deltoid. Patient tolerated injection well.

## 2024-08-07 ENCOUNTER — Encounter

## 2024-08-07 ENCOUNTER — Other Ambulatory Visit

## 2024-08-07 DIAGNOSIS — M1711 Unilateral primary osteoarthritis, right knee: Secondary | ICD-10-CM | POA: Diagnosis not present

## 2024-08-07 DIAGNOSIS — M6281 Muscle weakness (generalized): Secondary | ICD-10-CM | POA: Diagnosis not present

## 2024-08-07 DIAGNOSIS — R262 Difficulty in walking, not elsewhere classified: Secondary | ICD-10-CM | POA: Diagnosis not present

## 2024-08-07 DIAGNOSIS — M25661 Stiffness of right knee, not elsewhere classified: Secondary | ICD-10-CM | POA: Diagnosis not present

## 2024-08-11 ENCOUNTER — Ambulatory Visit
Admission: RE | Admit: 2024-08-11 | Discharge: 2024-08-11 | Disposition: A | Discharge status: Home / Self Care | Source: Ambulatory Visit | Attending: Family Medicine | Admitting: Family Medicine

## 2024-08-11 ENCOUNTER — Ambulatory Visit

## 2024-08-11 DIAGNOSIS — D0511 Intraductal carcinoma in situ of right breast: Secondary | ICD-10-CM

## 2024-08-11 DIAGNOSIS — N61 Mastitis without abscess: Secondary | ICD-10-CM

## 2024-08-11 DIAGNOSIS — R92321 Mammographic fibroglandular density, right breast: Secondary | ICD-10-CM | POA: Diagnosis not present

## 2024-08-11 DIAGNOSIS — R928 Other abnormal and inconclusive findings on diagnostic imaging of breast: Secondary | ICD-10-CM | POA: Diagnosis not present

## 2024-08-12 DIAGNOSIS — R262 Difficulty in walking, not elsewhere classified: Secondary | ICD-10-CM | POA: Diagnosis not present

## 2024-08-12 DIAGNOSIS — M6281 Muscle weakness (generalized): Secondary | ICD-10-CM | POA: Diagnosis not present

## 2024-08-12 DIAGNOSIS — M1711 Unilateral primary osteoarthritis, right knee: Secondary | ICD-10-CM | POA: Diagnosis not present

## 2024-08-12 DIAGNOSIS — M25661 Stiffness of right knee, not elsewhere classified: Secondary | ICD-10-CM | POA: Diagnosis not present

## 2024-08-14 ENCOUNTER — Ambulatory Visit: Payer: Self-pay

## 2024-08-14 NOTE — Telephone Encounter (Signed)
 Patient reports shortness of breath and fatigue has not got worse, but it still the same. Patient request to know how soon will notice improvement from B12 and symptoms?  Patient requesting to know if should continue taking Aspirin ? Pt reports stopped taking 2 weeks ago, due to stomach irritation.

## 2024-08-14 NOTE — Telephone Encounter (Signed)
 FYI Only or Action Required?: Action required by provider: clinical question for provider.  Patient was last seen in primary care on 07/30/2024 by Avelina Greig BRAVO, MD.  Called Nurse Triage reporting Respiratory Distress.  Symptoms began several weeks ago.  Interventions attempted: Prescription medications: b 12 injections.  Symptoms are: unchanged.  Triage Disposition: See PCP When Office is Open (Within 3 Days)  Patient/caregiver understands and will follow disposition?: No, wishes to speak with PCP  Copied from CRM #[id number]. Topic: Clinical - Red Word Triage >> Aug 14, 2024  2:33 PM Kristy [name] wrote: Red Word that prompted transfer to Nurse Triage: patient is short of breath and doesn't know what its from and wants to speak to the doctor. Reason for Disposition  [1] MODERATE longstanding difficulty breathing (e.g., speaks in phrases, SOB even at rest, pulse 100-120) AND [2] SAME as normal  Answer Assessment - Initial Assessment Questions Offered appt today, pt declined, requests to speak to PCP.  Patient reports shortness of breath and fatigue has not got worse, but it still the same. Patient request to know how soon will notice improvement from B12 and symptoms?  Patient requesting to know if should continue taking Aspirin ? Pt reports stopped taking 2 weeks ago, due to stomach irritation.  1. RESPIRATORY STATUS: Describe your breathing? (e.g., wheezing, shortness of breath, unable to speak, severe coughing)      Feeling tired and shortness of breath few weeks after surgery Denies chest pain, dizziness, faint, Prior to surgery HR was in 60s, now when up moving aroung HR goes 104, sitting 80s. When moving around have to stop and take a deep breath, denies shortness of breath at rest.  2. ONSET: When did this breathing problem begin?      Since knee replacement surgery, blood work done last week, got b12 shot last week, hemoglobin 10 3. PATTERN Does the difficult breathing come  and go, or has it been constant since it started?      Sob with movement/ activity 4. SEVERITY: How bad is your breathing? (e.g., mild, moderate, severe)      Normal, just feels tired and have to take a breath when moving around.  6. CARDIAC HISTORY: Do you have any history of heart disease? (e.g., heart attack, angina, bypass surgery, angioplasty)      No; one side of heart wall thicker due to medication 7. LUNG HISTORY: Do you have any history of lung disease?  (e.g., pulmonary embolus, asthma, emphysema)     no 8. CAUSE: What do you think is causing the breathing problem?      Something wrong, don't feel like my normal self 9. OTHER SYMPTOMS: Do you have any other symptoms? (e.g., chest pain, cough, dizziness, fever, runny nose)    Runny nose  12. TRAVEL: Have you traveled out of the country in the last month? (e.g., travel history, exposures)       no  Protocols used: Breathing Difficulty-A-AH

## 2024-08-15 NOTE — Telephone Encounter (Signed)
 Mrs. Muston notified as instructed by telephone.  She states after talking to the nurse she really started thinking about her daily routine.  She states she has been doing intermittent fasting for a while and she doesn't usually eat anything until around noon and then she feels it's her food choices that make her feel sluggish.  She states yesterday evening she went and got her a [employer] sandwich and after eating that she felt sluggish.  She really thinks her issue is diet related.  She states she does not feel this is heart related and does not wish to make an appointment at this time.  As far as the aspirin .  She states the orthopedist told her she could stop the ASA so still will just stay off the aspirin .   FYI to Dr. Avelina.

## 2024-08-15 NOTE — Telephone Encounter (Signed)
Kristy [name] notified as instructed by telephone.  Patient states understanding.

## 2024-08-15 NOTE — Telephone Encounter (Signed)
 Noted.  Definitely.. higher carb diet can contribute to post meal fatigue.  Try increasing protein and decreasing carbohydrates with each meal.  Intermittent fasting is not for everyone.

## 2024-08-18 ENCOUNTER — Telehealth: Payer: Self-pay

## 2024-08-18 NOTE — Telephone Encounter (Signed)
 Copied from CRM [id number]. Topic: Clinical - Request for Lab/Test Order >> Aug 18, 2024 10:40 AM Charolett L wrote: Reason for CRM: patient is requesting an order for the flu shot and would like to be called once placed

## 2024-08-18 NOTE — Telephone Encounter (Signed)
 Spoke with Kristy [name] and let her know she does not need an order for a flu shot.  I offered to schedule her a nurse visit but she wants to check with [facility] first.

## 2024-08-19 DIAGNOSIS — M1711 Unilateral primary osteoarthritis, right knee: Secondary | ICD-10-CM | POA: Diagnosis not present

## 2024-08-22 ENCOUNTER — Encounter: Payer: Self-pay | Admitting: Family Medicine

## 2024-08-22 ENCOUNTER — Ambulatory Visit: Payer: Self-pay | Admitting: *Deleted

## 2024-08-22 ENCOUNTER — Ambulatory Visit (INDEPENDENT_AMBULATORY_CARE_PROVIDER_SITE_OTHER): Admitting: Family Medicine

## 2024-08-22 VITALS — BP 130/70 | HR 85 | Temp 97.8°F | Ht 61.0 in | Wt 167.5 lb

## 2024-08-22 DIAGNOSIS — R300 Dysuria: Secondary | ICD-10-CM | POA: Diagnosis not present

## 2024-08-22 DIAGNOSIS — N9089 Other specified noninflammatory disorders of vulva and perineum: Secondary | ICD-10-CM | POA: Diagnosis not present

## 2024-08-22 LAB — POC URINALSYSI DIPSTICK (AUTOMATED)
Bilirubin, UA: NEGATIVE
Glucose, UA: NEGATIVE
Ketones, UA: NEGATIVE
Nitrite, UA: NEGATIVE
Protein, UA: NEGATIVE
Spec Grav, UA: 1.01 (ref 1.010–1.025)
Urobilinogen, UA: 0.2 U/dL
pH, UA: 8 (ref 5.0–8.0)

## 2024-08-22 MED ORDER — SULFAMETHOXAZOLE-TRIMETHOPRIM 800-160 MG PO TABS: 1.0000 | ORAL_TABLET | Freq: Two times a day (BID) | ORAL | 0 refills | Status: DC

## 2024-08-22 NOTE — Telephone Encounter (Signed)
 Spoke with Kristy [name] and scheduled her to see Dr. Avelina today at 2:00 pm.

## 2024-08-22 NOTE — Telephone Encounter (Signed)
 FYI Only or Action Required?: Action required by provider: request for appointment.  Patient was last seen in primary care on 07/30/2024 by Avelina Greig BRAVO, MD.  Called Nurse Triage reporting urinary pain.  Symptoms began several days ago.  Interventions attempted: Rest, hydration, or home remedies.  Symptoms are: gradually worsening.  Triage Disposition: See HCP Within 4 Hours (Or PCP Triage)  Patient/caregiver understands and will follow disposition?: No, refuses disposition   Reason for Disposition  [1] SEVERE pain with urination (e.g., excruciating) AND [2] not improved after 2 hours of pain medicine  Answer Assessment - Initial Assessment Questions Patient advised no open appointment- not even at alternative location- decline UC disposition- wants appointment in office.   1. SEVERITY: How bad is the pain?  (e.g., Scale 1-10; mild, moderate, or severe)     Pain during the night-5/10 severe, during the day - patient drinks a lot of water 2. FREQUENCY: How many times have you had painful urination today?      This morning 3. PATTERN: Is pain present every time you urinate or just sometimes?      Mostly at night 4. ONSET: When did the painful urination start?      Started Wednesday pm 5. FEVER: Do you have a fever? If Yes, ask: What is your temperature, how was it measured, and when did it start?     no 6. PAST UTI: Have you had a urine infection before? If Yes, ask: When was the last time? and What happened that time?      Years ago 7. CAUSE: What do you think is causing the painful urination?  (e.g., UTI, scratch, Herpes sore)     UTI 8. OTHER SYMPTOMS: Do you have any other symptoms? (e.g., blood in urine, flank pain, genital sores, urgency, vaginal discharge)     Odor, frequency, pressure  Protocols used: Urination Pain - Female-A-AH   Copied from CRM [id number]. Topic: Clinical - Red Word Triage >> Aug 22, 2024  8:53 AM Robinson H wrote: Red Word  that prompted transfer to Nurse Triage: Possible UTI, burning pain, pressure, frequency, slight odor

## 2024-08-22 NOTE — Progress Notes (Signed)
 Patient ID: Kristy [name], female    DOB: 1941-04-15, 83 y.o.   MRN: [id number]  This visit was conducted in person.  BP 130/70   Pulse 85   Temp 97.8 F (36.6 C) (Temporal)   Ht 5' 1 (1.549 m)   Wt 167 lb 8 oz (76 kg)   SpO2 99%   BMI 31.65 kg/m    CC:  Chief Complaint  Patient presents with   Dysuria    Subjective:   HPI: Kristy [name] is a 83 y.o. female presenting on 08/22/2024 for Dysuria  Dysuria  This is a new problem. The current episode started in the past 7 days. The problem has been waxing and waning. The quality of the pain is described as burning. The pain is at a severity of 6/10. There has been no fever. She is Sexually active. There is No history of pyelonephritis. Associated symptoms include frequency, hesitancy, nausea, urgency and vomiting. Pertinent negatives include no chills, discharge, flank pain, hematuria or sweats. Associated symptoms comments: nocturia. She has tried increased fluids for the symptoms. The treatment provided mild relief. There is no history of catheterization, recurrent UTIs, urinary stasis or a urological procedure.   Had sex right prior to symptoms starting.  Small amount of vaginal irritation on left labia.SABRA small pimple....    Hx of microscopic hematuria since age 17 negative past work up.  Relevant past medical, surgical, family and social history reviewed and updated as indicated. Interim medical history since our last visit reviewed. Allergies and medications reviewed and updated. Outpatient Medications Prior to Visit  Medication Sig Dispense Refill   atorvastatin  (LIPITOR) 20 MG tablet TAKE 1 TABLET BY MOUTH EVERY DAY 90 tablet 3   cyanocobalamin  (VITAMIN B12) 1000 MCG/ML injection Inject 1 mL (1,000 mcg total) into the muscle every 30 (thirty) days. Monthly x 3 months, start September 2025     losartan  (COZAAR ) 50 MG tablet TAKE 1 TABLET BY MOUTH EVERY DAY 90 tablet 0   metoprolol  succinate (TOPROL -XL) 100 MG 24  hr tablet TAKE 1 TABLET BY MOUTH DAILY  WITH OR IMMEDIATELY FOLLOWING A  MEAL 100 tablet 3   Multiple Vitamin (MULTIVITAMIN WITH MINERALS) TABS tablet Take 2 tablets by mouth daily.     sodium chloride  (OCEAN) 0.65 % SOLN nasal spray Place 1 spray into both nostrils every 4 (four) hours as needed for congestion.     tamoxifen  (NOLVADEX ) 20 MG tablet Take 1 tablet (20 mg total) by mouth daily. 90 tablet 3   triamterene -hydrochlorothiazide (MAXZIDE-25) 37.5-25 MG tablet TAKE 1 TABLET BY MOUTH DAILY 100 tablet 2   cefadroxil  (DURICEF) 500 MG capsule Take 1 capsule (500 mg total) by mouth 2 (two) times daily. 10 capsule 0   traMADol  (ULTRAM ) 50 MG tablet Take 50 mg by mouth every 6 (six) hours as needed. (Patient not taking: Reported on 07/30/2024)     No facility-administered medications prior to visit.     Per HPI unless specifically indicated in ROS section below Review of Systems  Constitutional:  Negative for chills.  Gastrointestinal:  Positive for nausea and vomiting.  Genitourinary:  Positive for dysuria, frequency, hesitancy and urgency. Negative for flank pain and hematuria.   Objective:  BP 130/70   Pulse 85   Temp 97.8 F (36.6 C) (Temporal)   Ht 5' 1 (1.549 m)   Wt 167 lb 8 oz (76 kg)   SpO2 99%   BMI 31.65 kg/m   Wt Readings from Last  3 Encounters:  08/22/24 167 lb 8 oz (76 kg)  07/30/24 169 lb 6.4 oz (76.8 kg)  07/07/24 169 lb 12.1 oz (77 kg)      Physical Exam Constitutional:      General: She is not in acute distress.    Appearance: Normal appearance. She is well-developed. She is not ill-appearing or toxic-appearing.  HENT:     Head: Normocephalic.     Right Ear: Hearing, tympanic membrane, ear canal and external ear normal. Tympanic membrane is not erythematous, retracted or bulging.     Left Ear: Hearing, tympanic membrane, ear canal and external ear normal. Tympanic membrane is not erythematous, retracted or bulging.     Nose: No mucosal edema or rhinorrhea.      Right Sinus: No maxillary sinus tenderness or frontal sinus tenderness.     Left Sinus: No maxillary sinus tenderness or frontal sinus tenderness.     Mouth/Throat:     Pharynx: Uvula midline.  Eyes:     General: Lids are normal. Lids are everted, no foreign bodies appreciated.     Conjunctiva/sclera: Conjunctivae normal.     Pupils: Pupils are equal, round, and reactive to light.  Neck:     Thyroid : No thyroid  mass or thyromegaly.     Vascular: No carotid bruit.     Trachea: Trachea normal.  Cardiovascular:     Rate and Rhythm: Normal rate and regular rhythm.     Pulses: Normal pulses.     Heart sounds: Normal heart sounds, S1 normal and S2 normal. No murmur heard.    No friction rub. No gallop.  Pulmonary:     Effort: Pulmonary effort is normal. No tachypnea or respiratory distress.     Breath sounds: Normal breath sounds. No decreased breath sounds, wheezing, rhonchi or rales.  Abdominal:     General: Bowel sounds are normal.     Palpations: Abdomen is soft.     Tenderness: There is no abdominal tenderness.  Genitourinary:     Comments: 0.3 cm mobile white inclusion cyst, minimal associated erythema on left labia Musculoskeletal:     Cervical back: Normal range of motion and neck supple.  Skin:    General: Skin is warm and dry.     Findings: No rash.  Neurological:     Mental Status: She is alert.  Psychiatric:        Mood and Affect: Mood is not anxious or depressed.        Speech: Speech normal.        Behavior: Behavior normal. Behavior is cooperative.        Thought Content: Thought content normal.        Judgment: Judgment normal.       Results for orders placed or performed in visit on 08/22/24  POCT Urinalysis Dipstick (Automated)   Collection Time: 08/22/24  2:17 PM  Result Value Ref Range   Color, UA Yellow    Clarity, UA Hazy    Glucose, UA Negative Negative   Bilirubin, UA Negative    Ketones, UA Negative    Spec Grav, UA 1.010 1.010 - 1.025    Blood, UA Moderate (2+)    pH, UA 8.0 5.0 - 8.0   Protein, UA Negative Negative   Urobilinogen, UA 0.2 0.2 or 1.0 E.U./dL   Nitrite, UA Negative    Leukocytes, UA Moderate (2+) (A) Negative    Assessment and Plan  Dysuria Assessment & Plan:  Acute, Most consistent with acute cystitis given  leukocytosis on urinalysis.  Likely related to recent sexual activity. Recommended urinating pre and post sex in the future. Will treat with Bactrim double strength 1 tablet twice daily for 3 days.  Send urine for culture.  Return and ER precautions provided.  She will go to the ED if she cannot keep down antibiotics or liquids, fever on antibiotics or CVA tenderness.  Orders: -     POCT Urinalysis Dipstick (Automated) -     Urine Culture  Labial lesion Assessment & Plan: Acute, small slightly irritated labial inclusion cyst. Can put antibiotic ointment on it.  No active infection currently. Can use warm compresses 3 times daily.   Other orders -     Sulfamethoxazole -Trimethoprim; Take 1 tablet by mouth 2 (two) times daily.  Dispense: 6 tablet; Refill: 0    No follow-ups on file.   Greig Ring, MD

## 2024-08-22 NOTE — Assessment & Plan Note (Signed)
 Acute, small slightly irritated labial inclusion cyst. Can put antibiotic ointment on it.  No active infection currently. Can use warm compresses 3 times daily.

## 2024-08-22 NOTE — Assessment & Plan Note (Signed)
 Acute, Most consistent with acute cystitis given leukocytosis on urinalysis.  Likely related to recent sexual activity. Recommended urinating pre and post sex in the future. Will treat with Bactrim double strength 1 tablet twice daily for 3 days.  Send urine for culture.  Return and ER precautions provided.  She will go to the ED if she cannot keep down antibiotics or liquids, fever on antibiotics or CVA tenderness.

## 2024-08-24 ENCOUNTER — Other Ambulatory Visit: Payer: Self-pay | Admitting: Family Medicine

## 2024-08-24 LAB — URINE CULTURE
MICRO NUMBER:: 17053945
SPECIMEN QUALITY:: ADEQUATE

## 2024-08-26 ENCOUNTER — Ambulatory Visit: Payer: Self-pay | Admitting: Family Medicine

## 2024-08-26 DIAGNOSIS — M1711 Unilateral primary osteoarthritis, right knee: Secondary | ICD-10-CM | POA: Diagnosis not present

## 2024-08-26 DIAGNOSIS — M6281 Muscle weakness (generalized): Secondary | ICD-10-CM | POA: Diagnosis not present

## 2024-08-26 DIAGNOSIS — M25661 Stiffness of right knee, not elsewhere classified: Secondary | ICD-10-CM | POA: Diagnosis not present

## 2024-08-26 DIAGNOSIS — R262 Difficulty in walking, not elsewhere classified: Secondary | ICD-10-CM | POA: Diagnosis not present

## 2024-09-10 ENCOUNTER — Ambulatory Visit

## 2024-09-10 DIAGNOSIS — E538 Deficiency of other specified B group vitamins: Secondary | ICD-10-CM

## 2024-09-10 MED ORDER — CYANOCOBALAMIN 1000 MCG/ML IJ SOLN
1000.0000 ug | Freq: Once | INTRAMUSCULAR | Status: AC
  Administered 2024-09-10: 1000 ug via INTRAMUSCULAR

## 2024-09-10 NOTE — Progress Notes (Signed)
 Per orders of Dr. Jacques Copland, injection of B-12 given by Bobbette Sprague in left deltoid. Patient tolerated injection well.

## 2024-10-08 ENCOUNTER — Other Ambulatory Visit: Payer: Self-pay | Admitting: Hematology and Oncology

## 2024-10-08 DIAGNOSIS — Z1231 Encounter for screening mammogram for malignant neoplasm of breast: Secondary | ICD-10-CM

## 2024-10-09 ENCOUNTER — Other Ambulatory Visit (INDEPENDENT_AMBULATORY_CARE_PROVIDER_SITE_OTHER): Payer: Self-pay | Admitting: Vascular Surgery

## 2024-10-09 ENCOUNTER — Telehealth: Payer: Self-pay | Admitting: Internal Medicine

## 2024-10-09 DIAGNOSIS — I701 Atherosclerosis of renal artery: Secondary | ICD-10-CM

## 2024-10-09 DIAGNOSIS — I6523 Occlusion and stenosis of bilateral carotid arteries: Secondary | ICD-10-CM

## 2024-10-09 DIAGNOSIS — I773 Arterial fibromuscular dysplasia: Secondary | ICD-10-CM

## 2024-10-09 NOTE — Telephone Encounter (Signed)
 Patient inquiring if she can schedule direct for colon and egd. Please advise. Previously had a ov in July and was scheduled but had to cancel due to another procedure.

## 2024-10-10 NOTE — Telephone Encounter (Signed)
 Discussed PA recommendations with patient. Scheduled OV for 12/9 at 3:20 pm with Deanna. Pt had no further questions.

## 2024-10-13 ENCOUNTER — Encounter (INDEPENDENT_AMBULATORY_CARE_PROVIDER_SITE_OTHER): Payer: Self-pay | Admitting: Vascular Surgery

## 2024-10-13 ENCOUNTER — Ambulatory Visit (INDEPENDENT_AMBULATORY_CARE_PROVIDER_SITE_OTHER): Payer: Medicare Other | Admitting: Vascular Surgery

## 2024-10-13 ENCOUNTER — Ambulatory Visit (INDEPENDENT_AMBULATORY_CARE_PROVIDER_SITE_OTHER): Payer: Medicare Other

## 2024-10-13 VITALS — BP 124/78 | HR 65 | Resp 18 | Ht 62.0 in | Wt 166.2 lb

## 2024-10-13 DIAGNOSIS — E782 Mixed hyperlipidemia: Secondary | ICD-10-CM | POA: Diagnosis not present

## 2024-10-13 DIAGNOSIS — I773 Arterial fibromuscular dysplasia: Secondary | ICD-10-CM

## 2024-10-13 DIAGNOSIS — I6523 Occlusion and stenosis of bilateral carotid arteries: Secondary | ICD-10-CM | POA: Diagnosis not present

## 2024-10-13 DIAGNOSIS — I10 Essential (primary) hypertension: Secondary | ICD-10-CM | POA: Diagnosis not present

## 2024-10-13 DIAGNOSIS — K219 Gastro-esophageal reflux disease without esophagitis: Secondary | ICD-10-CM

## 2024-10-13 DIAGNOSIS — I701 Atherosclerosis of renal artery: Secondary | ICD-10-CM | POA: Diagnosis not present

## 2024-10-13 NOTE — Progress Notes (Unsigned)
 MRN : [id number]  Kristy [name] is a 83 y.o. (Mar 15, 1941) female who presents with chief complaint of check carotid arteries.  History of Present Illness:   The patient presents today for follow-up of renal artery stenosis as well as carotid artery stenosis.  The patient also has a history of fibromuscular dysplasia.  The patient denies any major issues with her blood pressure.  She notes it is slightly elevated today but it is always normal at home.  She denies claudication-like symptoms.  Patient is also active daily.  She notes that she wears compression socks.   Interval change in her overall health care: She has been having increased creased problems with her right knee and will need a total knee replacement   There have been no noted TIA or CVA-like symptoms.  Overall the patient has been doing well.   Carotid duplex done today shows no evidence of stenosis in the bilateral carotid arteries.  The bilateral vertebral arteries demonstrate antegrade flow with normal flow hemodynamics in the bilateral subclavian arteries.   Renal artery duplex done today shows no evidence of stenosis in the left renal artery.  The right renal artery has a 1 to 59% stenosis.  The bilateral kidney size is normal.  The resistive index is normal bilaterally.  The cortical thickness is normal bilaterally.  No significant change from last years study.    No outpatient medications have been marked as taking for the 10/13/24 encounter (Appointment) with Jama, Cordella MATSU, MD.    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   Constipation     uses stool softener PRN- uses once a week to once every 2 weeks    Fibromuscular dysplasia    Gallbladder disease    GERD (gastroesophageal reflux disease)    Hyperlipidemia    Hypertension    For 16 years; urinary catecholamines within normal limits 12/09; renal Doppler ultrasound showed no evidence for renal artery stenosis   Normal echocardiogram 02/2009   LVEF 65%; no regional wall motion abnormalities; normal RV size and function; pulmonic valve had increased gradient across w/ peak gradient of about 36 mmHg (range of moderate pulmonic stenosis) followup showed normal valve   Papilloma of breast    right   Peripheral vascular disease    Personal history of radiation therapy    2022   Tinnitus of left ear    Tubular adenoma of colon 10/2013    Past Surgical History:  Procedure Laterality Date   ANGIOPLASTY     2012   BREAST BIOPSY Right 2006   benign   BREAST BIOPSY Right 11/09/2020   BREAST BIOPSY Right 11/23/2020   BREAST BIOPSY Right 11/29/2020   x2   BREAST EXCISIONAL BIOPSY Right 07/2017   benign   BREAST LUMPECTOMY Right 12/16/2020   BREAST LUMPECTOMY WITH RADIOACTIVE SEED LOCALIZATION Right 08/14/2017   Procedure: RIGHT BREAST LUMPECTOMY WITH RADIOACTIVE SEED LOCALIZATION;  Surgeon: Vanderbilt Ned, MD;  Location: [facility];  Service: General;  Laterality: Right;   BREAST LUMPECTOMY WITH RADIOACTIVE SEED LOCALIZATION Right 12/16/2020   Procedure: RADIOACTIVE SEED GUIDED TIMES 3 RIGHT BREAST LUMPECTOMY;  Surgeon: Vanderbilt Ned, MD;  Location: [facility];  Service: General;  Laterality: Right;   Cardiolyte  09/2006   Neg   CHOLECYSTECTOMY N/A 09/22/2021   Procedure: LAPAROSCOPIC CHOLECYSTECTOMY WITH  INTRAOPERATIVE CHOLANGIOGRAM;  Surgeon: Vanderbilt Ned, MD;  Location: [facility];  Service: General;  Laterality: N/A;   COLONOSCOPY     KNEE CARTILAGE SURGERY     right knee   POLYPECTOMY     RE-EXCISION OF BREAST LUMPECTOMY Right  01/11/2021   Procedure: RE-EXCISION OF RIGHT BREAST LUMPECTOMY;  Surgeon: Vanderbilt Ned, MD;  Location: [facility];  Service: General;  Laterality: Right;   TOTAL ABDOMINAL HYSTERECTOMY     no cervix   TOTAL KNEE ARTHROPLASTY Right 07/07/2024   Procedure: ARTHROPLASTY, KNEE, TOTAL;  Surgeon: Edna Toribio LABOR, MD;  Location: [facility];  Service: Orthopedics;  Laterality: Right;   UPPER GASTROINTESTINAL ENDOSCOPY      Social History Social History   Tobacco Use   Smoking status: Former    Current packs/day: 0.00    Average packs/day: 1 pack/day for 15.0 years (15.0 ttl pk-yrs)    Types: Cigarettes    Start date: 11/21/1967    Quit date: 11/20/1982    Years since quitting: 41.9   Smokeless tobacco: Never   Tobacco comments:    15 pack year history  Vaping Use   Vaping status: Never Used  Substance Use Topics   Alcohol  use: No   Drug use: No    Family History Family History  Problem Relation Age of Onset   Prostate cancer Father    Lung cancer Father    Colon polyps Sister    Colon cancer Maternal Uncle    Colon cancer Paternal Uncle    Coronary artery disease Paternal Grandmother    Goiter Other        ?   Esophageal cancer Neg Hx    Stomach cancer Neg Hx    Rectal cancer Neg Hx    Bladder Cancer Neg Hx    Kidney cancer Neg Hx    BRCA 1/2 Neg Hx    Breast cancer Neg Hx     Allergies  Allergen Reactions   Keflex  [Cephalexin ]     Made pt feel funny    Vibramycin  [Doxycycline ]     Many years ago - made me feel funny     REVIEW OF SYSTEMS (Negative unless checked)  Constitutional: [] Weight loss  [] Fever  [] Chills Cardiac: [] Chest pain   [] Chest pressure   [] Palpitations   [] Shortness of breath when laying flat   [] Shortness of breath with exertion. Vascular:  [x] Pain in legs with walking   [] Pain in legs at rest  [] History of DVT   [] Phlebitis   [] Swelling in legs   [] Varicose veins   [] Non-healing ulcers Pulmonary:   [] Uses home oxygen    [] Productive cough   [] Hemoptysis   [] Wheeze  [] COPD   [] Asthma Neurologic:  [x] Dizziness   [] Seizures   [] History of stroke   [] History of TIA  [] Aphasia   [] Vissual changes   [] Weakness or numbness in arm   [] Weakness or numbness in leg Musculoskeletal:   [] Joint swelling   [x] Joint pain   [x] Low back pain Hematologic:  [] Easy bruising  [] Easy bleeding   [] Hypercoagulable state   [] Anemic Gastrointestinal:  []   Diarrhea   [] Vomiting  [x] Gastroesophageal reflux/heartburn   [] Difficulty swallowing. Genitourinary:  [] Chronic kidney disease   [] Difficult urination  [] Frequent urination   [] Blood in urine Skin:  [] Rashes   [] Ulcers  Psychological:  [] History of anxiety   []  History of major depression.  Physical Examination  There were no vitals filed for this visit. There is no height or weight on file to calculate BMI. Gen: WD/WN, NAD Head: [place]/AT, No temporalis wasting.  Ear/Nose/Throat: Hearing grossly intact, nares w/o erythema or drainage Eyes: PER, EOMI, sclera nonicteric.  Neck: Supple, no masses.  No bruit or JVD.  Pulmonary:  Good air movement, no audible wheezing, no use of accessory muscles.  Cardiac: RRR, normal S1, S2, no Murmurs. Vascular:  carotid bruit noted Vessel Right Left  Radial Palpable Palpable  Gastrointestinal: soft, non-distended. No guarding/no peritoneal signs.  Musculoskeletal: M/S 5/5 throughout.  No visible deformity.  Neurologic: CN 2-12 intact. Pain and light touch intact in extremities.  Symmetrical.  Speech is fluent. Motor exam as listed above. Psychiatric: Judgment intact, Mood & affect appropriate for pt's clinical situation. Dermatologic: No rashes or ulcers noted.  No changes consistent with cellulitis.   CBC Lab Results  Component Value Date   WBC 5.1 07/30/2024   HGB 10.9 (L) 07/30/2024   HCT 32.3 (L) 07/30/2024   MCV 95.0 07/30/2024   PLT 281.0 07/30/2024    BMET    Component Value Date/Time   NA 138 07/30/2024 1310   K 3.6 07/30/2024  1310   CL 101 07/30/2024 1310   CO2 29 07/30/2024 1310   GLUCOSE 98 07/30/2024 1310   BUN 14 07/30/2024 1310   CREATININE 0.88 07/30/2024 1310   CREATININE 0.85 05/02/2024 1007   CALCIUM  10.3 07/30/2024 1310   GFRNONAA >60 06/18/2024 1406   GFRAA >60 08/09/2017 1300   CrCl cannot be calculated (Patient's most recent lab result is older than the maximum 21 days allowed.).  COAG No results found for: INR, PROTIME  Radiology No results found.   Assessment/Plan 1. Fibromuscular dysplasia (HCC) Given patient's fibromuscular dysplasia optimal control of the patient's hypertension is important.   BP is acceptable today.  And she remained symptom-free regarding the previously documented FMD of the carotid arteries.  In both the carotid and renal artery distributions the FMD appears to be very mild.  Nevertheless FMD Progress and therefore continued monitoring is important.   The patient's vital signs and noninvasive studies support the renal artery stenosis that is not significantly increased when compared to the previous study (in fact it might be slightly improved on the right compared to previous study).   No invasive studies or intervention is indicated at this time.   The patient will continue the current antihypertensive medications, no changes at this time.   The primary medical service will continue aggressive antihypertensive therapy as per the AHA guidelines    - VAS US  RENAL ARTERY DUPLEX; Future (we will plan for 2 years) - VAS US  CAROTID; Future (we will plan for 2 years)   2. Renal artery stenosis (HCC) Given patient's fibromuscular dysplasia optimal control of the patient's hypertension is important.   BP is acceptable today.  And she remained symptom-free regarding the previously documented FMD of the carotid arteries.  In both the carotid and renal artery distributions the FMD appears to be very mild.  Nevertheless FMD Progress and therefore continued monitoring  is important.   The patient's vital signs and noninvasive studies support the renal artery stenosis that  is not significantly increased when compared to the previous study (in fact it might be slightly improved on the right compared to previous study).   No invasive studies or intervention is indicated at this time.   The patient will continue the current antihypertensive medications, no changes at this time.   The primary medical service will continue aggressive antihypertensive therapy as per the AHA guidelines  - VAS US  RENAL ARTERY DUPLEX; Future (we will plan for 2 years)   3. Bilateral carotid artery stenosis Given patient's fibromuscular dysplasia optimal control of the patient's hypertension is important.   BP is acceptable today.  And she remained symptom-free regarding the previously documented FMD of the carotid arteries.  In both the carotid and renal artery distributions the FMD appears to be very mild.  Nevertheless FMD Progress and therefore continued monitoring is important.   The patient's vital signs and noninvasive studies support the renal artery stenosis that is not significantly increased when compared to the previous study (in fact it might be slightly improved on the right compared to previous study).   No invasive studies or intervention is indicated at this time.   The patient will continue the current antihypertensive medications, no changes at this time.   The primary medical service will continue aggressive antihypertensive therapy as per the AHA guidelines  - VAS US  CAROTID; Future (we will plan for 2 years)   4. HYPERTENSION, BENIGN ESSENTIAL, LABILE Continue antihypertensive medications as already ordered, these medications have been reviewed and there are no changes at this time.   5. Primary osteoarthritis involving multiple joints The patient has severe degenerative changes of the right knee and is undergoing evaluation for possible total knee  replacement.  From the vascular standpoint she is cleared for the surgery.  No preoperative invasive testing or further workup is indicated at this time.     Continue medications to treat the patient's degenerative disease as already ordered, these medications have been reviewed and there are no changes at this time.   Continued activity and therapy was stressed.   Cordella Shawl, MD  10/13/2024 9:57 AM

## 2024-10-14 ENCOUNTER — Encounter (INDEPENDENT_AMBULATORY_CARE_PROVIDER_SITE_OTHER): Payer: Self-pay | Admitting: Vascular Surgery

## 2024-10-15 ENCOUNTER — Ambulatory Visit

## 2024-10-21 ENCOUNTER — Ambulatory Visit

## 2024-10-21 DIAGNOSIS — E538 Deficiency of other specified B group vitamins: Secondary | ICD-10-CM

## 2024-10-21 MED ORDER — CYANOCOBALAMIN 1000 MCG/ML IJ SOLN
1000.0000 ug | Freq: Once | INTRAMUSCULAR | Status: AC
  Administered 2024-10-21: 1000 ug via INTRAMUSCULAR

## 2024-10-21 NOTE — Progress Notes (Signed)
 Per orders of Dr. Greig Ring, injection of B-12 given by Nellie Hummer in Right deltoid Patient tolerated injection well.

## 2024-10-28 ENCOUNTER — Ambulatory Visit: Admitting: Gastroenterology

## 2024-11-03 ENCOUNTER — Telehealth: Payer: Self-pay | Admitting: Hematology and Oncology

## 2024-11-03 NOTE — Telephone Encounter (Signed)
 Spoke with pt regarding resceduled appt on 12/01/24 moved to 12/05/24

## 2024-11-05 ENCOUNTER — Inpatient Hospital Stay: Admission: RE | Admit: 2024-11-05 | Discharge: 2024-11-05 | Attending: Hematology and Oncology

## 2024-11-05 DIAGNOSIS — Z1231 Encounter for screening mammogram for malignant neoplasm of breast: Secondary | ICD-10-CM

## 2024-11-25 ENCOUNTER — Encounter: Payer: Self-pay | Admitting: Gastroenterology

## 2024-11-25 ENCOUNTER — Ambulatory Visit: Admitting: Gastroenterology

## 2024-11-25 VITALS — BP 132/74 | HR 73 | Ht 62.0 in | Wt 168.0 lb

## 2024-11-25 DIAGNOSIS — K219 Gastro-esophageal reflux disease without esophagitis: Secondary | ICD-10-CM

## 2024-11-25 DIAGNOSIS — Z860100 Personal history of colon polyps, unspecified: Secondary | ICD-10-CM | POA: Diagnosis not present

## 2024-11-25 DIAGNOSIS — R112 Nausea with vomiting, unspecified: Secondary | ICD-10-CM

## 2024-11-25 DIAGNOSIS — K9089 Other intestinal malabsorption: Secondary | ICD-10-CM | POA: Diagnosis not present

## 2024-11-25 MED ORDER — NA SULFATE-K SULFATE-MG SULF 17.5-3.13-1.6 GM/177ML PO SOLN: 1.0000 | Freq: Once | ORAL | 0 refills | Status: AC

## 2024-11-25 MED ORDER — OMEPRAZOLE 40 MG PO CPDR: 40.0000 mg | DELAYED_RELEASE_CAPSULE | Freq: Every day | ORAL | 3 refills | Status: DC

## 2024-11-25 MED ORDER — ONDANSETRON 4 MG PO TBDP: 4.0000 mg | ORAL_TABLET | Freq: Three times a day (TID) | ORAL | 0 refills | Status: AC | PRN

## 2024-11-25 MED ORDER — CHOLESTYRAMINE 4 G PO PACK: 4.0000 g | PACK | Freq: Every day | ORAL | 0 refills | Status: DC

## 2024-11-25 NOTE — Patient Instructions (Addendum)
 GERD -Recommend GERD diet , no late meals -start omeprazole  40 mg po daily , take 1 tablet 30-45 mins before breakfast  Diarrhea -start cholestyramine  4gm po at bedtime -if daily causes constipation, can go to every other day -Take other oral drugs at least 1 hour before or 4 to 6 hours after your cholestyramine  dose to avoid potential interactions.  Nausea  -take zofran  4 mg prior to starting bowel prep and as needed  We have sent the following medications to your pharmacy for you to pick up at your convenience: Suprep Zofran  Omeprazole  Cholestyramine   You have been scheduled for a colonoscopy. Please follow written instructions given to you at your visit today.   If you use inhalers (even only as needed), please bring them with you on the day of your procedure.  DO NOT TAKE 7 DAYS PRIOR TO TEST- Trulicity (dulaglutide) Ozempic, Wegovy (semaglutide) Mounjaro, Zepbound (tirzepatide) Bydureon Bcise (exanatide extended release)  DO NOT TAKE 1 DAY PRIOR TO YOUR TEST Rybelsus (semaglutide) Adlyxin (lixisenatide) Victoza (liraglutide) Byetta (exanatide) ___________________________________________________________________________  Due to recent changes in healthcare laws, you may see the results of your imaging and laboratory studies on MyChart before your provider has had a chance to review them.  We understand that in some cases there may be results that are confusing or concerning to you. Not all laboratory results come back in the same time frame and the provider may be waiting for multiple results in order to interpret others.  Please give us  48 hours in order for your provider to thoroughly review all the results before contacting the office for clarification of your results.   _______________________________________________________  If your blood pressure at your visit was 140/90 or greater, please contact your primary care physician to follow up on  this.  _______________________________________________________  If you are age 82 or older, your body mass index should be between 23-30. Your Body mass index is 30.73 kg/m. If this is out of the aforementioned range listed, please consider follow up with your Primary Care Provider.  If you are age 33 or younger, your body mass index should be between 19-25. Your Body mass index is 30.73 kg/m. If this is out of the aformentioned range listed, please consider follow up with your Primary Care Provider.   ________________________________________________________  The [facility] providers would like to encourage you to use MYCHART to communicate with providers for non-urgent requests or questions.  Due to long hold times on the telephone, sending your provider a message by [facility] may be a faster and more efficient way to get a response.  Please allow 48 business hours for a response.  Please remember that this is for non-urgent requests.  _______________________________________________________  [facility] is using a team-based approach to care.  Your team is made up of your doctor and two to three APPS. Our APPS (Nurse Practitioners and Physician Assistants) work with your physician to ensure care continuity for you. They are fully qualified to address your health concerns and develop a treatment plan. They communicate directly with your gastroenterologist to care for you. Seeing the Advanced Practice Practitioners on your physician's team can help you by facilitating care more promptly, often allowing for earlier appointments, access to diagnostic testing, procedures, and other specialty referrals.   Thank you for trusting me with your gastrointestinal care. Deanna May, FNP-C

## 2024-11-25 NOTE — Progress Notes (Signed)
 I agree with the assessment and plan as outlined by Ms. May.

## 2024-11-25 NOTE — Progress Notes (Signed)
 "  Chief Complaint: vomiting, dysphagia, colon screening Primary GI Doctor: (previously Dr. Aneita) Dr. Federico  HPI: Patient is a 84 year old female patient with past medical history of GERD, hypertension, fibromuscular dysplasia, and hyperlipidemia, who was referred to me by Avelina Greig BRAVO, MD on 05/22/24 for a evaluation of vomiting, dysphagia, and colon screening .    Interval History Last seen in GI office on 11/29/23 by Harlene, PA for GERD and change in bowel habits.  Patient was scheduled for colonoscopy in July 2025 and unable to tolerate prep therefore was never completed. She would like to discuss options today to prevent same issues. Patient has had post chole diarrhea and will have up to 5 stools per day with postprandial urgency. She reports it is worse with eating out.   Patient reports acid reflux and abdominal burning. This results in regurgitation of her food.  She is not currently taking any antiacids. She would like to consider reflux medication versus EGD.   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   Constipation    uses stool softener PRN- uses once a week to once every 2 weeks    Fibromuscular dysplasia    Gallbladder disease    GERD (gastroesophageal reflux disease)    Hyperlipidemia    Hypertension    For 16 years; urinary catecholamines within normal limits 12/09; renal Doppler ultrasound showed no evidence for renal artery stenosis   Normal echocardiogram 02/2009   LVEF 65%; no regional  wall motion abnormalities; normal RV size and function; pulmonic valve had increased gradient across w/ peak gradient of about 36 mmHg (range of moderate pulmonic stenosis) followup showed normal valve   Papilloma of breast    right   Peripheral vascular disease    Personal history of radiation therapy    2022   Tinnitus of left ear    Tubular adenoma of colon 10/2013    Past Surgical History:  Procedure Laterality Date   ANGIOPLASTY     2012   BREAST BIOPSY Right 2006   benign   BREAST BIOPSY Right 11/09/2020   BREAST BIOPSY Right 11/23/2020   BREAST BIOPSY Right 11/29/2020   x2   BREAST EXCISIONAL BIOPSY Right 07/2017   benign   BREAST LUMPECTOMY Right 12/16/2020   BREAST LUMPECTOMY WITH RADIOACTIVE SEED LOCALIZATION Right 08/14/2017   Procedure: RIGHT BREAST LUMPECTOMY WITH RADIOACTIVE SEED LOCALIZATION;  Surgeon: Vanderbilt Ned, MD;  Location: [facility];  Service: General;  Laterality: Right;   BREAST LUMPECTOMY WITH RADIOACTIVE SEED LOCALIZATION Right 12/16/2020   Procedure: RADIOACTIVE SEED GUIDED TIMES 3 RIGHT BREAST LUMPECTOMY;  Surgeon: Vanderbilt Ned, MD;  Location: [facility];  Service: General;  Laterality: Right;   Cardiolyte  09/2006   Neg   CHOLECYSTECTOMY N/A 09/22/2021   Procedure: LAPAROSCOPIC CHOLECYSTECTOMY WITH  INTRAOPERATIVE CHOLANGIOGRAM;  Surgeon: Vanderbilt Ned, MD;  Location: [facility];  Service: General;  Laterality: N/A;   COLONOSCOPY     KNEE CARTILAGE SURGERY     right knee   POLYPECTOMY     RE-EXCISION OF BREAST LUMPECTOMY Right 01/11/2021   Procedure: RE-EXCISION OF RIGHT BREAST LUMPECTOMY;  Surgeon: Vanderbilt Ned, MD;  Location: [facility];  Service: General;  Laterality: Right;   TOTAL ABDOMINAL HYSTERECTOMY     no cervix   TOTAL KNEE ARTHROPLASTY Right 07/07/2024   Procedure: ARTHROPLASTY, KNEE, TOTAL;  Surgeon: Edna Toribio LABOR, MD;  Location: [facility];  Service: Orthopedics;  Laterality:  Right;   UPPER GASTROINTESTINAL ENDOSCOPY      Current Outpatient Medications  Medication Sig Dispense Refill   atorvastatin  (LIPITOR) 20 MG tablet TAKE 1 TABLET BY MOUTH EVERY DAY 90 tablet 3   cholestyramine  (QUESTRAN ) 4 g packet Take 1 packet (4 g total) by mouth at bedtime. Suggest starting out once daily, can increase up to three times a day as needed, can cause constipation 90 packet 0   losartan  (COZAAR ) 50 MG tablet TAKE 1 TABLET BY MOUTH EVERY DAY 90 tablet 3   metoprolol  succinate (TOPROL -XL) 100 MG 24 hr tablet TAKE 1 TABLET BY MOUTH DAILY  WITH OR IMMEDIATELY FOLLOWING A  MEAL 100 tablet 3   Multiple Vitamin (MULTIVITAMIN WITH MINERALS) TABS tablet Take 2 tablets by mouth daily.     Na Sulfate-K Sulfate-Mg Sulfate concentrate (SUPREP) 17.5-3.13-1.6 GM/177ML SOLN Take 1 kit (354 mLs total) by mouth once for 1 dose. 354 mL 0   omeprazole  (PRILOSEC) 40 MG capsule Take 1 capsule (40 mg total) by mouth daily. 90 capsule 3   ondansetron  (ZOFRAN -ODT) 4 MG disintegrating tablet Take 1 tablet (4 mg total) by mouth every 8 (eight) hours as needed for nausea or vomiting (preop bowle prep). 10 tablet 0   sodium chloride  (OCEAN) 0.65 % SOLN nasal spray Place 1 spray into both nostrils every 4 (four) hours as needed for congestion.     tamoxifen  (NOLVADEX ) 20 MG tablet Take 1 tablet (20 mg total) by mouth daily. 90 tablet 3   triamterene -hydrochlorothiazide (MAXZIDE-25) 37.5-25 MG tablet TAKE 1 TABLET BY MOUTH DAILY 100 tablet 2   No current facility-administered medications for this visit.    Allergies as of 11/25/2024 - Review Complete 11/25/2024  Allergen Reaction Noted   Nickel Dermatitis 10/13/2024   Keflex  [cephalexin ]  06/17/2024   Vibramycin  [doxycycline ]  02/08/2021    Family History  Problem Relation Age of Onset   Prostate cancer Father    Lung cancer Father    Colon polyps Sister    Coronary artery disease Paternal Grandmother    Colon cancer Maternal Uncle    Colon  cancer Paternal Uncle    Goiter Other        ?   Esophageal cancer Neg Hx    Stomach cancer Neg Hx    Rectal cancer Neg Hx    Bladder Cancer Neg Hx    Kidney cancer Neg Hx    BRCA 1/2 Neg Hx     Review of Systems:    Constitutional: No weight loss, fever, chills, weakness or fatigue HEENT: Eyes: No change in vision               Ears, Nose, Throat:  No change in hearing or congestion Skin: No rash or itching Cardiovascular: No chest pain, chest pressure or palpitations   Respiratory: No SOB or cough Gastrointestinal: See HPI and otherwise negative Genitourinary: No dysuria or change in urinary  frequency Neurological: No headache, dizziness or syncope Musculoskeletal: No new muscle or joint pain Hematologic: No bleeding or bruising Psychiatric: No history of depression or anxiety    Physical Exam:  Vital signs: BP 132/74   Pulse 73   Ht 5' 2 (1.575 m)   Wt 168 lb (76.2 kg)   SpO2 97%   BMI 30.73 kg/m   Constitutional:   Pleasant  female appears to be in NAD, Well developed, Well nourished, alert and cooperative Eyes:   PEERL, EOMI. No icterus. Conjunctiva pink. Neck:  Supple Throat: Oral cavity and pharynx without inflammation, swelling or lesion.  Respiratory: Respirations even and unlabored. Lungs clear to auscultation bilaterally.   No wheezes, crackles, or rhonchi.  Cardiovascular: Normal S1, S2. Regular rate and rhythm. No peripheral edema, cyanosis or pallor.  Gastrointestinal:  Soft, nondistended, nontender. No rebound or guarding. Normal bowel sounds. No appreciable masses or hepatomegaly. Rectal:  Not performed.  Msk:  Symmetrical without gross deformities. Without edema, no deformity or joint abnormality.  Neurologic:  Alert and  oriented x4;  grossly normal neurologically.  Skin:   Dry and intact without significant lesions or rashes.  RELEVANT LABS AND IMAGING: CBC    Latest Ref Rng & Units 07/30/2024    1:10 PM 06/18/2024    2:06 PM 05/02/2024   10:07  AM  CBC  WBC 4.0 - 10.5 K/uL 5.1  7.6  6.0   Hemoglobin 12.0 - 15.0 g/dL 89.0  87.1  87.3   Hematocrit 36.0 - 46.0 % 32.3  39.4  38.5   Platelets 150.0 - 400.0 K/uL 281.0  313  301      CMP     Latest Ref Rng & Units 07/30/2024    1:10 PM 06/18/2024    2:06 PM 05/02/2024   10:07 AM  CMP  Glucose 70 - 99 mg/dL 98  92  891   BUN 6 - 23 mg/dL 14  18  13    Creatinine 0.40 - 1.20 mg/dL 9.11  9.18  9.14   Sodium 135 - 145 mEq/L 138  138  140   Potassium 3.5 - 5.1 mEq/L 3.6  3.6  3.8   Chloride 96 - 112 mEq/L 101  103  103   CO2 19 - 32 mEq/L 29  22  27    Calcium  8.4 - 10.5 mg/dL 89.6  89.8  89.9   Total Protein 6.0 - 8.3 g/dL 7.1  7.5  6.9   Total Bilirubin 0.2 - 1.2 mg/dL 0.4  0.5  0.4   Alkaline Phos 39 - 117 U/L 68  48    AST 0 - 37 U/L 15  23  17    ALT 0 - 35 U/L 11  20  15       Lab Results  Component Value Date   TSH 1.56 07/30/2024  11/2023 echo-  Left ventricular ejection fraction, by estimation, is 60 to 65%.   04/2019 colonoscopy, recall 2 years - One 10 mm polyp in the transverse colon, removed with a hot snare. Resected and retrieved. - Nine 6 to 8 mm polyps in the sigmoid colon, in the transverse colon and in the ascending colon, removed with a cold snare. Resected and retrieved. - Mild diverticulosis in the left colon.  - Internal hemorrhoids.  - The examination was otherwise normal on direct and retroflexion views. Path: 1. Surgical [P], colon, transverse and ascending, polyp (5) - TUBULAR ADENOMA(S) WITHOUT HIGH-GRADE DYSPLASIA OR MALIGNANCY  2. Surgical [P], colon, transverse, polyp -  TUBULAR ADENOMA(S) WITHOUT HIGH-GRADE DYSPLASIA OR MALIGNANCY  3. Surgical [P], colon, sigmoid, polyp (4) - TUBULAR ADENOMA(S) WITHOUT HIGH-GRADE DYSPLASIA OR MALIGNANCY - HYPERPLASTIC POLYP(S)   Assessment: Encounter Diagnoses  Name Primary?   History of colonic polyps Yes   Bile salt-induced diarrhea    Gastroesophageal reflux disease, unspecified whether esophagitis present     Nausea and vomiting, unspecified vomiting type       84 year old female patient with history of multiple colonic polyps, overdue for colon screening colonoscopy. Pt had issues tolerating prep back in the summer and returns to reschedule. Will give her antiemetics preop.     Patient has had ongoing issues with reflux and would prefer to try PPI therapy versus pursuing EGD. Will start patient on Omeprazole  40 mg po daily and recommend GERD diet.    For the diarrhea post lap chole we discussed she can use cholestyramine  4gm po daily. Avoid food triggers.  Plan: -Recommend GERD diet , no late meals -start omeprazole  40 mg po daily  -start cholestyramine  4gm po at bedtime -preop antinausea zofran  4 mg preop -recommend EGD, pt declined - Schedule for a colonoscopy in [facility] with Dr. Federico. The risks and benefits of colonoscopy with possible polypectomy / biopsies were discussed and the patient agrees to proceed.    Thank you for the courtesy of this consult. Please call me with any questions or concerns.   Rithika Seel, FNP-C [facility] 11/25/2024, 2:43 PM  Cc: Avelina Greig BRAVO, MD  "

## 2024-11-28 ENCOUNTER — Ambulatory Visit: Payer: Self-pay

## 2024-11-28 NOTE — Telephone Encounter (Signed)
 Spoke with Kristy [name] and confirmed all her BP medications.  Patient is taking the Losartan , Metoprolol  XL and Maxzide as prescribed.  Please advise.

## 2024-11-28 NOTE — Telephone Encounter (Signed)
 Kristy [name] notified to double up on her Losartan  to 100 mg until we see her next week.  She will follow her BP closely over the weekend and knows to decrease the losartan  back down to 50 mg daily if her blood pressure started dropping too low.

## 2024-11-28 NOTE — Telephone Encounter (Signed)
 Next Appt With Family Medicine Darra Ring, MD) 12/02/2024 at 2:20 PM

## 2024-11-28 NOTE — Telephone Encounter (Signed)
 FYI Only or Action Required?: FYI only for provider: appointment scheduled on 12/04/24.  Patient was last seen in primary care on 08/22/2024 by Avelina Greig BRAVO, MD.  Called Nurse Triage reporting Hypertension.  Symptoms began yesterday.  Interventions attempted: Prescription medications: Losartan  50mg .  Symptoms are: stable.  Triage Disposition: See PCP When Office is Open (Within 3 Days)  Patient/caregiver understands and will follow disposition?: Yes  Reason for Disposition  Systolic BP >= 160 OR Diastolic >= 100  Answer Assessment - Initial Assessment Questions Patient reports getting these episodes from time to time where she experiences increased tinnitus and not feeling well and increased BP. Blood pressure yesterday 174/74 HR 70. 161/78 at 2am and took a Losartan  and was able to go back to sleep. Patient mentioned it felt like a stroke was coming. Denies having blurred vision, slurred speech or numbness or weakness. All symptoms subsided, just mild tinnitus. Reports previous MRIs, unsure what is causing this but something is triggering it. Compliant with BP medication regimen.   1. BLOOD PRESSURE: What is your blood pressure? Did you take at least two measurements 5 minutes apart?     138/75  2. ONSET: When did you take your blood pressure?     15 minutes ago  3. HOW: How did you take your blood pressure? (e.g., automatic home BP monitor, visiting nurse)     Automatic home monitor  4. HISTORY: Do you have a history of high blood pressure?     Yes  5. MEDICINES: Are you taking any medicines for blood pressure? Have you missed any doses recently?     losartan  (COZAAR ) 50 MG tablet  6. OTHER SYMPTOMS: Do you have any symptoms? (e.g., blurred vision, chest pain, difficulty breathing, headache, weakness)     Abd pain left side, thought it was gas, experienced loud tinnitus, felt brain fog  Protocols used: Blood Pressure - High-A-AH  Copied from CRM #[id number].  Topic: Clinical - Red Word Triage >> Nov 28, 2024  8:49 AM Deleta RAMAN wrote: Red Word that prompted transfer to Nurse Triage: patient blood pressure went high on yesterday tried getting it down, but is snot where it should be. Ringing in ear

## 2024-12-01 ENCOUNTER — Ambulatory Visit: Payer: Medicare Other | Admitting: Hematology and Oncology

## 2024-12-02 ENCOUNTER — Ambulatory Visit: Admitting: Family Medicine

## 2024-12-02 VITALS — BP 123/76 | HR 79 | Temp 98.2°F | Ht 61.0 in | Wt 166.2 lb

## 2024-12-02 DIAGNOSIS — I773 Arterial fibromuscular dysplasia: Secondary | ICD-10-CM

## 2024-12-02 DIAGNOSIS — H9312 Tinnitus, left ear: Secondary | ICD-10-CM | POA: Diagnosis not present

## 2024-12-02 DIAGNOSIS — H9193 Unspecified hearing loss, bilateral: Secondary | ICD-10-CM | POA: Diagnosis not present

## 2024-12-02 DIAGNOSIS — I10 Essential (primary) hypertension: Secondary | ICD-10-CM

## 2024-12-02 NOTE — Assessment & Plan Note (Signed)
 Chronic, with recent spike, now back in the normal range on routine blood pressure regimen History of fibromuscular dysplasia.  Reviewed recent office visit note with vascular Dr. Jama November 2025 stable carotid duplex and renal artery duplex. Patient denies stress/anxiety, weight gain, lifestyle change, no alcohol /salt change Associated with tinnitus.  Will evaluate with labs for new thyroid , liver, kidney or blood disorder cause. Patient instructed if blood pressure spikes again for her to take additional losartan  400 mg daily. No chest pain or change in breathing.  No fluid overload.

## 2024-12-02 NOTE — Assessment & Plan Note (Signed)
 Followed by vascular

## 2024-12-02 NOTE — Assessment & Plan Note (Signed)
 Acute, intermittent.  May be related to blood pressure spikes but patient has noted persistently. Nonpulsatile. Will evaluate with MRI brain/IAC to rule out neurologic/acoustic neuroma source.

## 2024-12-02 NOTE — Progress Notes (Signed)
 "   Patient ID: Kristy [name], female    DOB: 10/23/1941, 84 y.o.   MRN: [id number]  This visit was conducted in person.  BP 123/76   Pulse 79   Temp 98.2 F (36.8 C) (Temporal)   Ht 5' 1 (1.549 m)   Wt 166 lb 4 oz (75.4 kg)   SpO2 98%   BMI 31.41 kg/m    CC:  Chief Complaint  Patient presents with   Hypertension   Tinnitus    Subjective:   HPI: Kristy [name] is a 84 y.o. female presenting on 12/02/2024 for Hypertension and Tinnitus    HTN recent elevated BPs 161-174/8-73 associated with left ear tinnitus.. on 11/28/2024  Foggy vision, left arm  tingly. Did deep breathing and BP went down to 138/60s.  Denies having slurred speech or numbness or weakness.   She was instructed to increase losartan  to 100 mg daily.. she has not done this.  Remaining rest of week 122/62  Ear ringing is resolved.  Has had similar episode in past   She has noted hearing loss left greater than right   No recent increase.in salt or ETOH.  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 Otherwise normal appearance the brain for a person of this age.   Wt Readings from Last 3 Encounters:  12/02/24 166 lb 4 oz (75.4 kg)  11/25/24 168 lb (76.2 kg)  10/13/24 166 lb 3.2 oz (75.4 kg)    Reviewed Dr. Dreama OV 10/13/2024  Bilateral carotid stenosis, renal artery stenosis, history of foibromuscular dysplasia. Carotid duplex done 10/13/24 shows no evidence of stenosis in the bilateral carotid arteries.  The bilateral vertebral arteries demonstrate antegrade flow with normal flow hemodynamics in the bilateral subclavian arteries.   Renal artery duplex done today shows no evidence of  stenosis in the left renal artery.  The right renal artery has a 1 to 59% stenosis.  The bilateral kidney size is normal.  The resistive index is normal bilaterally.  The cortical thickness is normal bilaterally.  No significant change from last years study.  Relevant past medical, surgical, family and social history reviewed and updated as indicated. Interim medical history since our last visit reviewed. Allergies and medications reviewed and updated. Outpatient Medications Prior to Visit  Medication Sig Dispense Refill   atorvastatin  (LIPITOR) 20 MG tablet TAKE 1 TABLET BY MOUTH EVERY DAY 90 tablet 3   cholestyramine  (QUESTRAN ) 4 g packet Take 1 packet (4 g total) by mouth at bedtime. Suggest starting out once daily, can increase up to three times a day as needed, can cause constipation 90 packet 0   losartan  (COZAAR ) 50 MG tablet TAKE 1 TABLET BY MOUTH EVERY DAY 90 tablet 3   metoprolol  succinate (TOPROL -XL) 100 MG 24 hr tablet TAKE 1 TABLET BY MOUTH DAILY  WITH OR IMMEDIATELY FOLLOWING A  MEAL 100 tablet 3   Multiple Vitamin (MULTIVITAMIN WITH MINERALS) TABS tablet Take 2 tablets by mouth daily.     ondansetron  (ZOFRAN -ODT) 4 MG disintegrating tablet Take 1 tablet (4 mg total) by mouth every 8 (eight) hours as needed for nausea or vomiting (preop bowle prep). 10 tablet 0   sodium chloride  (OCEAN) 0.65 % SOLN nasal  spray Place 1 spray into both nostrils every 4 (four) hours as needed for congestion.     tamoxifen  (NOLVADEX ) 20 MG tablet Take 1 tablet (20 mg total) by mouth daily. 90 tablet 3   triamterene -hydrochlorothiazide (MAXZIDE-25) 37.5-25 MG tablet TAKE 1 TABLET BY MOUTH DAILY 100 tablet 2   omeprazole  (PRILOSEC) 40 MG capsule Take 1 capsule (40 mg total) by mouth daily. 90 capsule 3   No facility-administered medications prior to visit.     Per HPI unless specifically indicated in ROS section below Review of Systems  Constitutional:  Negative for fatigue and fever.  HENT:  Negative  for congestion.   Eyes:  Negative for pain.  Respiratory:  Negative for cough and shortness of breath.   Cardiovascular:  Negative for chest pain, palpitations and leg swelling.  Gastrointestinal:  Negative for abdominal pain.  Genitourinary:  Negative for dysuria and vaginal bleeding.  Musculoskeletal:  Negative for back pain.  Neurological:  Negative for syncope, light-headedness and headaches.  Psychiatric/Behavioral:  Negative for dysphoric mood.    Objective:  BP 123/76   Pulse 79   Temp 98.2 F (36.8 C) (Temporal)   Ht 5' 1 (1.549 m)   Wt 166 lb 4 oz (75.4 kg)   SpO2 98%   BMI 31.41 kg/m   Wt Readings from Last 3 Encounters:  12/02/24 166 lb 4 oz (75.4 kg)  11/25/24 168 lb (76.2 kg)  10/13/24 166 lb 3.2 oz (75.4 kg)      Physical Exam Constitutional:      General: She is not in acute distress.    Appearance: Normal appearance. She is well-developed. She is not ill-appearing or toxic-appearing.  HENT:     Head: Normocephalic.     Right Ear: Hearing, tympanic membrane, ear canal and external ear normal. Tympanic membrane is not erythematous, retracted or bulging.     Left Ear: Hearing, tympanic membrane, ear canal and external ear normal. Tympanic membrane is not erythematous, retracted or bulging.     Nose: No mucosal edema or rhinorrhea.     Right Sinus: No maxillary sinus tenderness or frontal sinus tenderness.     Left Sinus: No maxillary sinus tenderness or frontal sinus tenderness.     Mouth/Throat:     Pharynx: Uvula midline.  Eyes:     General: Lids are normal. Lids are everted, no foreign bodies appreciated.     Conjunctiva/sclera: Conjunctivae normal.     Pupils: Pupils are equal, round, and reactive to light.  Neck:     Thyroid : No thyroid  mass or thyromegaly.     Vascular: No carotid bruit.     Trachea: Trachea normal.  Cardiovascular:     Rate and Rhythm: Normal rate and regular rhythm.     Pulses: Normal pulses.     Heart sounds: Normal heart  sounds, S1 normal and S2 normal. No murmur heard.    No friction rub. No gallop.  Pulmonary:     Effort: Pulmonary effort is normal. No tachypnea or respiratory distress.     Breath sounds: Normal breath sounds. No decreased breath sounds, wheezing, rhonchi or rales.  Abdominal:     General: Bowel sounds are normal.     Palpations: Abdomen is soft.     Tenderness: There is no abdominal tenderness.  Musculoskeletal:     Cervical back: Normal range of motion and neck supple.  Skin:    General: Skin is warm and dry.     Findings: No rash.  Neurological:  Mental Status: She is alert.  Psychiatric:        Mood and Affect: Mood is not anxious or depressed.        Speech: Speech normal.        Behavior: Behavior normal. Behavior is cooperative.        Thought Content: Thought content normal.        Judgment: Judgment normal.       Results for orders placed or performed in visit on 08/22/24  POCT Urinalysis Dipstick (Automated)   Collection Time: 08/22/24  2:17 PM  Result Value Ref Range   Color, UA Yellow    Clarity, UA Hazy    Glucose, UA Negative Negative   Bilirubin, UA Negative    Ketones, UA Negative    Spec Grav, UA 1.010 1.010 - 1.025   Blood, UA Moderate (2+)    pH, UA 8.0 5.0 - 8.0   Protein, UA Negative Negative   Urobilinogen, UA 0.2 0.2 or 1.0 E.U./dL   Nitrite, UA Negative    Leukocytes, UA Moderate (2+) (A) Negative  Urine Culture   Collection Time: 08/22/24  2:59 PM   Specimen: Urine  Result Value Ref Range   MICRO NUMBER: [id number]    SPECIMEN QUALITY: Adequate    Sample Source URINE    STATUS: FINAL    ISOLATE 1: Proteus mirabilis (A)       Susceptibility   Proteus mirabilis - URINE CULTURE, REFLEX    AMOX/CLAVULANIC <=2 Sensitive     AMPICILLIN/SULBACTAM <=2 Sensitive     CEFAZOLIN  4 Intermediate     CEFTAZIDIME <=0.5 Sensitive     CEFEPIME <=0.12 Sensitive     CEFTRIAXONE <=0.25 Sensitive     CIPROFLOXACIN  <=0.06 Sensitive     LEVOFLOXACIN  <=0.12 Sensitive     GENTAMICIN  <=1 Sensitive     MEROPENEM <=0.25 Sensitive     NITROFURANTOIN 128 Resistant     PIP/TAZO <=4 Sensitive     TRIMETH /SULFA * <=20 Sensitive      * Legend: S = Susceptible  I = Intermediate R = Resistant  NS = Not susceptible SDD = Susceptible Dose Dependent * = Not Tested  NR = Not Reported **NN = See Therapy Comments     Assessment and Plan  Tinnitus of left ear, non pulsitile Assessment & Plan: Acute, intermittent.  May be related to blood pressure spikes but patient has noted persistently. Nonpulsatile. Will evaluate with MRI brain/IAC to rule out neurologic/acoustic neuroma source.  Orders: -     Ambulatory referral to ENT -     MR BRAIN/IAC W WO CONTRAST; Future  Bilateral hearing loss, unspecified hearing loss type -     Ambulatory referral to ENT  Primary hypertension Assessment & Plan: Chronic, with recent spike, now back in the normal range on routine blood pressure regimen History of fibromuscular dysplasia.  Reviewed recent office visit note with vascular Dr. Jama November 2025 stable carotid duplex and renal artery duplex. Patient denies stress/anxiety, weight gain, lifestyle change, no alcohol /salt change Associated with tinnitus.  Will evaluate with labs for new thyroid , liver, kidney or blood disorder cause. Patient instructed if blood pressure spikes again for her to take additional losartan  400 mg daily. No chest pain or change in breathing.  No fluid overload.  Orders: -     Comprehensive metabolic panel with GFR -     CBC with Differential/Platelet -     TSH  Fibromuscular dysplasia Assessment & Plan: Followed by vascular  No follow-ups on file.   Greig Ring, MD  "

## 2024-12-03 LAB — COMPREHENSIVE METABOLIC PANEL WITH GFR
ALT: 11 U/L (ref 3–35)
AST: 16 U/L (ref 5–37)
Albumin: 4.3 g/dL (ref 3.5–5.2)
Alkaline Phosphatase: 52 U/L (ref 39–117)
BUN: 18 mg/dL (ref 6–23)
CO2: 27 meq/L (ref 19–32)
Calcium: 10.8 mg/dL — ABNORMAL HIGH (ref 8.4–10.5)
Chloride: 103 meq/L (ref 96–112)
Creatinine, Ser: 0.79 mg/dL (ref 0.40–1.20)
GFR: 70.11 mL/min
Glucose, Bld: 87 mg/dL (ref 70–99)
Potassium: 3.5 meq/L (ref 3.5–5.1)
Sodium: 138 meq/L (ref 135–145)
Total Bilirubin: 0.4 mg/dL (ref 0.2–1.2)
Total Protein: 7.5 g/dL (ref 6.0–8.3)

## 2024-12-03 LAB — CBC WITH DIFFERENTIAL/PLATELET
Basophils Absolute: 0 K/uL (ref 0.0–0.1)
Basophils Relative: 0.7 % (ref 0.0–3.0)
Eosinophils Absolute: 0.1 K/uL (ref 0.0–0.7)
Eosinophils Relative: 1.3 % (ref 0.0–5.0)
HCT: 37.3 % (ref 36.0–46.0)
Hemoglobin: 12.7 g/dL (ref 12.0–15.0)
Lymphocytes Relative: 35.4 % (ref 12.0–46.0)
Lymphs Abs: 2 K/uL (ref 0.7–4.0)
MCHC: 33.9 g/dL (ref 30.0–36.0)
MCV: 94.6 fl (ref 78.0–100.0)
Monocytes Absolute: 0.5 K/uL (ref 0.1–1.0)
Monocytes Relative: 8.9 % (ref 3.0–12.0)
Neutro Abs: 3.1 K/uL (ref 1.4–7.7)
Neutrophils Relative %: 53.7 % (ref 43.0–77.0)
Platelets: 301 K/uL (ref 150.0–400.0)
RBC: 3.95 Mil/uL (ref 3.87–5.11)
RDW: 13.7 % (ref 11.5–15.5)
WBC: 5.7 K/uL (ref 4.0–10.5)

## 2024-12-03 LAB — TSH: TSH: 2.41 u[IU]/mL (ref 0.35–5.50)

## 2024-12-03 NOTE — Addendum Note (Signed)
 Addended by: ISADORA RAISIN on: 12/03/2024 02:00 PM   Modules accepted: Orders

## 2024-12-04 ENCOUNTER — Ambulatory Visit: Payer: Self-pay | Admitting: Family Medicine

## 2024-12-05 ENCOUNTER — Inpatient Hospital Stay: Attending: Hematology and Oncology | Admitting: Hematology and Oncology

## 2024-12-05 VITALS — BP 120/80 | HR 74 | Temp 97.9°F | Resp 18 | Ht 61.0 in | Wt 166.5 lb

## 2024-12-05 DIAGNOSIS — Z87891 Personal history of nicotine dependence: Secondary | ICD-10-CM | POA: Diagnosis not present

## 2024-12-05 DIAGNOSIS — Z800 Family history of malignant neoplasm of digestive organs: Secondary | ICD-10-CM | POA: Diagnosis not present

## 2024-12-05 DIAGNOSIS — Z923 Personal history of irradiation: Secondary | ICD-10-CM | POA: Insufficient documentation

## 2024-12-05 DIAGNOSIS — Z801 Family history of malignant neoplasm of trachea, bronchus and lung: Secondary | ICD-10-CM | POA: Diagnosis not present

## 2024-12-05 DIAGNOSIS — Z79810 Long term (current) use of selective estrogen receptor modulators (SERMs): Secondary | ICD-10-CM | POA: Diagnosis not present

## 2024-12-05 DIAGNOSIS — D0511 Intraductal carcinoma in situ of right breast: Secondary | ICD-10-CM | POA: Insufficient documentation

## 2024-12-05 MED ORDER — TAMOXIFEN CITRATE 10 MG PO TABS: 10.0000 mg | ORAL_TABLET | Freq: Two times a day (BID) | ORAL | 1 refills | Status: DC

## 2024-12-05 NOTE — Progress Notes (Signed)
 "    Symptom Management Consult Note [facility]    Patient Care Team: Avelina Greig BRAVO, MD as PCP - General Vanderbilt Ned, MD as Consulting Physician (General Surgery) Dewey Rush, MD as Consulting Physician (Radiation Oncology) Aneita Gwendlyn DASEN, MD (Inactive) as Consulting Physician (Gastroenterology) Tyree Nanetta SAILOR, RN as Oncology Nurse Navigator [place], Morna SAILOR, [facility] (Inactive) as Pharmacist (Pharmacist) Loretha Ash, MD as Consulting Physician (Hematology and Oncology) Court Dorn PARAS, MD as Consulting Physician (Cardiology)    Name / MRN / DOB: Kristy [name]  [id number]  [birth year]   Date of visit: 12/05/2024   Chief Complaint/Reason for visit: breast redness  Current Therapy: Tamoxifen  Assessment & Plan  Breast cancer She has breast cancer with poor adherence to tamoxifen  due to joint pain.  - Prescribed tamoxifen  10 mg daily. - Instructed daily dosing. - Arranged prescription pickup. - Scheduled follow-up in one year. - Advised to call with changes or concerns.  Breast cellulitis, resolved Previous breast cellulitis resolved post-antibiotics with no symptoms or findings.  Heme/Onc History: Oncology History  Ductal carcinoma in situ (DCIS) of right breast  09/10/2012 Initial Diagnosis   Ductal carcinoma in situ (DCIS) of right breast   12/27/2020 Cancer Staging   Staging form: Breast, AJCC 7th Edition - Pathologic: Stage 0 (Tis (DCIS), N0, cM0) - Signed by Layla Sandria BROCKS, MD on 12/27/2020 Histologic grade (G): G3 Estrogen receptor status: Positive Progesterone receptor status: Negative       Interval history-: Kristy [name] is a 84 y.o. female with oncologic history is here for follow up of right breast cellulitis  Discussed the use of AI scribe software for clinical note transcription with the patient, who gave verbal consent to proceed.  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  She developed erythema and tenderness of the breast following a recent mammogram. The infection resolved after a course of antibiotics completed approximately two months ago.  She has been receiving tamoxifen  since May 2022 but has not been fully adherent to the regimen since undergoing knee arthroplasty on August 18th. She attributes her nonadherence to arthralgia associated with tamoxifen , which was exacerbated by post-surgical recovery and physical therapy. She has been intermittently taking 20 mg tamoxifen , rather than daily as prescribed, and inquired about the possibility of a lower dose or alternate dosing schedule to improve tolerability.  She discussed concerns regarding arthralgia and medication adherence, expressing interest in a lower daily dose.    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   Constipation    uses stool softener PRN- uses once a week to once every 2 weeks    Fibromuscular dysplasia    Gallbladder disease    GERD (  gastroesophageal reflux disease)    Hyperlipidemia    Hypertension    For 16 years; urinary catecholamines within normal limits 12/09; renal Doppler ultrasound showed no evidence for renal artery stenosis   Normal echocardiogram 02/2009   LVEF 65%; no regional wall motion abnormalities;  normal RV size and function; pulmonic valve had increased gradient across w/ peak gradient of about 36 mmHg (range of moderate pulmonic stenosis) followup showed normal valve   Papilloma of breast    right   Peripheral vascular disease    Personal history of radiation therapy    2022   Tinnitus of left ear    Tubular adenoma of colon 10/2013     Past Surgical History:  Procedure Laterality Date   ANGIOPLASTY     2012   BREAST BIOPSY Right 2006   benign   BREAST BIOPSY Right 11/09/2020   BREAST BIOPSY Right 11/23/2020   BREAST BIOPSY Right 11/29/2020   x2   BREAST EXCISIONAL BIOPSY Right 07/2017   benign   BREAST LUMPECTOMY Right 12/16/2020   BREAST LUMPECTOMY WITH RADIOACTIVE SEED LOCALIZATION Right 08/14/2017   Procedure: RIGHT BREAST LUMPECTOMY WITH RADIOACTIVE SEED LOCALIZATION;  Surgeon: Vanderbilt Ned, MD;  Location: [facility];  Service: General;  Laterality: Right;   BREAST LUMPECTOMY WITH RADIOACTIVE SEED LOCALIZATION Right 12/16/2020   Procedure: RADIOACTIVE SEED GUIDED TIMES 3 RIGHT BREAST LUMPECTOMY;  Surgeon: Vanderbilt Ned, MD;  Location: [facility];  Service: General;  Laterality: Right;   Cardiolyte  09/2006   Neg   CHOLECYSTECTOMY N/A 09/22/2021   Procedure: LAPAROSCOPIC CHOLECYSTECTOMY WITH  INTRAOPERATIVE CHOLANGIOGRAM;  Surgeon: Vanderbilt Ned, MD;  Location: [facility];  Service: General;  Laterality: N/A;   COLONOSCOPY     KNEE CARTILAGE SURGERY     right knee   POLYPECTOMY     RE-EXCISION OF BREAST LUMPECTOMY Right 01/11/2021   Procedure: RE-EXCISION OF RIGHT BREAST LUMPECTOMY;  Surgeon: Vanderbilt Ned, MD;  Location: [facility];  Service: General;  Laterality: Right;   TOTAL ABDOMINAL HYSTERECTOMY     no cervix   TOTAL KNEE ARTHROPLASTY Right 07/07/2024   Procedure: ARTHROPLASTY, KNEE, TOTAL;  Surgeon: Edna Toribio LABOR, MD;  Location: [facility];  Service: Orthopedics;  Laterality: Right;   UPPER  GASTROINTESTINAL ENDOSCOPY      Social History   Socioeconomic History   Marital status: Widowed    Spouse name: Not on file   Number of children: 0   Years of education: Not on file   Highest education level: Some college, no degree  Occupational History   Occupation: Educational Psychologist: RETIRED    Comment: Retired   Occupation: retired  Tobacco Use   Smoking status: Former    Current packs/day: 0.00    Average packs/day: 1 pack/day for 15.0 years (15.0 ttl pk-yrs)    Types: Cigarettes    Start date: 11/21/1967    Quit date: 11/20/1982    Years since quitting: 42.0   Smokeless tobacco: Never   Tobacco comments:    15 pack year history  Vaping Use   Vaping status: Never Used  Substance and Sexual Activity   Alcohol  use: No   Drug use: No   Sexual activity: Not on file  Other Topics Concern   Not on file  Social History Narrative   Widowed in 2007-spouse died from colon cancer.   Regular exercise at [facility].   Diet consists of fruits and veggies, snacks a lot.  Social Drivers of Health   Tobacco Use: Medium Risk (12/02/2024)   Patient History    Smoking Tobacco Use: Former    Smokeless Tobacco Use: Never    Passive Exposure: Not on file  Financial Resource Strain: Low Risk (12/02/2024)   Overall Financial Resource Strain (CARDIA)    Difficulty of Paying Living Expenses: Not hard at all  Food Insecurity: No Food Insecurity (12/02/2024)   Epic    Worried About Programme Researcher, Broadcasting/film/video in the Last Year: Never true    Ran Out of Food in the Last Year: Never true  Transportation Needs: No Transportation Needs (12/02/2024)   Epic    Lack of Transportation (Medical): No    Lack of Transportation (Non-Medical): No  Physical Activity: Insufficiently Active (12/02/2024)   Exercise Vital Sign    Days of Exercise per Week: 5 days    Minutes of Exercise per Session: 20 min  Stress: No Stress Concern Present (12/02/2024)   [employer] of Occupational Health -  Occupational Stress Questionnaire    Feeling of Stress: Only a little  Social Connections: Moderately Isolated (12/02/2024)   Social Connection and Isolation Panel    Frequency of Communication with Friends and Family: More than three times a week    Frequency of Social Gatherings with Friends and Family: Once a week    Attends Religious Services: 1 to 4 times per year    Active Member of [employer] or Organizations: No    Attends Banker Meetings: Not on file    Marital Status: Widowed  Intimate Partner Violence: Not At Risk (04/01/2024)   Humiliation, Afraid, Rape, and Kick questionnaire    Fear of Current or Ex-Partner: No    Emotionally Abused: No    Physically Abused: No    Sexually Abused: No  Depression (PHQ2-9): Low Risk (08/22/2024)   Depression (PHQ2-9)    PHQ-2 Score: 0  Alcohol  Screen: Low Risk (03/25/2024)   Alcohol  Screen    Last Alcohol  Screening Score (AUDIT): 0  Housing: Low Risk (12/02/2024)   Epic    Unable to Pay for Housing in the Last Year: No    Number of Times Moved in the Last Year: 0    Homeless in the Last Year: No  Utilities: Not At Risk (04/01/2024)   [employer] Utilities    Threatened with loss of utilities: No  Health Literacy: Adequate Health Literacy (04/01/2024)   B1300 Health Literacy    Frequency of need for help with medical instructions: Never    Family History  Problem Relation Age of Onset   Prostate cancer Father    Lung cancer Father    Colon polyps Sister    Coronary artery disease Paternal Grandmother    Colon cancer Maternal Uncle    Colon cancer Paternal Uncle    Goiter Other        ?   Esophageal cancer Neg Hx    Stomach cancer Neg Hx    Rectal cancer Neg Hx    Bladder Cancer Neg Hx    Kidney cancer Neg Hx    BRCA 1/2 Neg Hx      Current Outpatient Medications:    atorvastatin  (LIPITOR) 20 MG tablet, TAKE 1 TABLET BY MOUTH EVERY DAY, Disp: 90 tablet, Rfl: 3   cholestyramine  (QUESTRAN ) 4 g packet, Take 1 packet (4 g total)  by mouth at bedtime. Suggest starting out once daily, can increase up to three times a day as needed, can cause constipation, Disp:  90 packet, Rfl: 0   losartan  (COZAAR ) 50 MG tablet, TAKE 1 TABLET BY MOUTH EVERY DAY, Disp: 90 tablet, Rfl: 3   metoprolol  succinate (TOPROL -XL) 100 MG 24 hr tablet, TAKE 1 TABLET BY MOUTH DAILY  WITH OR IMMEDIATELY FOLLOWING A  MEAL, Disp: 100 tablet, Rfl: 3   Multiple Vitamin (MULTIVITAMIN WITH MINERALS) TABS tablet, Take 2 tablets by mouth daily., Disp: , Rfl:    ondansetron  (ZOFRAN -ODT) 4 MG disintegrating tablet, Take 1 tablet (4 mg total) by mouth every 8 (eight) hours as needed for nausea or vomiting (preop bowle prep)., Disp: 10 tablet, Rfl: 0   sodium chloride  (OCEAN) 0.65 % SOLN nasal spray, Place 1 spray into both nostrils every 4 (four) hours as needed for congestion., Disp: , Rfl:    tamoxifen  (NOLVADEX ) 10 MG tablet, Take 1 tablet (10 mg total) by mouth 2 (two) times daily., Disp: 90 tablet, Rfl: 1   triamterene -hydrochlorothiazide (MAXZIDE-25) 37.5-25 MG tablet, TAKE 1 TABLET BY MOUTH DAILY, Disp: 100 tablet, Rfl: 2  PHYSICAL EXAM: ECOG FS:1 - Symptomatic but completely ambulatory    Vitals:   12/05/24 1300  BP: 120/80  Pulse: 74  Resp: 18  Temp: 97.9 F (36.6 C)  TempSrc: Temporal  SpO2: 100%  Weight: 166 lb 8 oz (75.5 kg)  Height: 5' 1 (1.549 m)    Physical Exam Vitals and nursing note reviewed.  Constitutional:      Appearance: She is not ill-appearing or toxic-appearing.  HENT:     Head: Normocephalic.  Eyes:     Conjunctiva/sclera: Conjunctivae normal.  Cardiovascular:     Rate and Rhythm: Normal rate and regular rhythm.     Pulses: Normal pulses.     Heart sounds: Normal heart sounds.  Pulmonary:     Effort: Pulmonary effort is normal.     Breath sounds: Normal breath sounds.  Chest:     Comments: Bilateral breasts inspected No palpable breast masses No regional adenopathy Abdominal:     General: There is no distension.   Musculoskeletal:     Cervical back: Normal range of motion.  Skin:    General: Skin is warm and dry.  Neurological:     Mental Status: She is alert.        LABORATORY DATA: I have reviewed the data as listed    Latest Ref Rng & Units 12/02/2024    2:54 PM 07/30/2024    1:10 PM 06/18/2024    2:06 PM  CBC  WBC 4.0 - 10.5 K/uL 5.7  5.1  7.6   Hemoglobin 12.0 - 15.0 g/dL 87.2  89.0  87.1   Hematocrit 36.0 - 46.0 % 37.3  32.3  39.4   Platelets 150.0 - 400.0 K/uL 301.0  281.0  313         Latest Ref Rng & Units 12/02/2024    2:54 PM 07/30/2024    1:10 PM 06/18/2024    2:06 PM  CMP  Glucose 70 - 99 mg/dL 87  98  92   BUN 6 - 23 mg/dL 18  14  18    Creatinine 0.40 - 1.20 mg/dL 9.20  9.11  9.18   Sodium 135 - 145 mEq/L 138  138  138   Potassium 3.5 - 5.1 mEq/L 3.5  3.6  3.6   Chloride 96 - 112 mEq/L 103  101  103   CO2 19 - 32 mEq/L 27  29  22    Calcium  8.4 - 10.5 mg/dL 89.1  89.6  89.8  Total Protein 6.0 - 8.3 g/dL 7.5  7.1  7.5   Total Bilirubin 0.2 - 1.2 mg/dL 0.4  0.4  0.5   Alkaline Phos 39 - 117 U/L 52  68  48   AST 5 - 37 U/L 16  15  23    ALT 3 - 35 U/L 11  11  20         RADIOGRAPHIC STUDIES (from last 24 hours if applicable) I have personally reviewed the radiological images as listed and agreed with the findings in the report. No results found.   Visit Diagnosis: No diagnosis found.  No orders of the defined types were placed in this encounter.   All questions were answered. The patient knows to call the clinic with any problems, questions or concerns. No barriers to learning was detected.  A total of more than 30 minutes were spent on this encounter with face-to-face time and non-face-to-face time, including preparing to see the patient, ordering tests and/or medications, counseling the patient and coordination of care as outlined above.    Thank you for allowing me to participate in the care of this patient.    Amber Stalls, MD Department of  Hematology/Oncology [facility] at [facility] Phone: [phone]  [phone]    12/05/2024 3:12 PM     "

## 2024-12-05 NOTE — Progress Notes (Signed)
 "    Symptom Management Consult Note [name]    Patient Care Team: Avelina Greig BRAVO, MD as PCP - General Vanderbilt Ned, MD as Consulting Physician (General Surgery) Dewey Rush, MD as Consulting Physician (Radiation Oncology) Aneita Gwendlyn DASEN, MD (Inactive) as Consulting Physician (Gastroenterology) Tyree Nanetta SAILOR, RN as Oncology Nurse Navigator [place], Morna SAILOR, [facility] (Inactive) as Pharmacist (Pharmacist) Loretha Ash, MD as Consulting Physician (Hematology and Oncology) Court Dorn PARAS, MD as Consulting Physician (Cardiology)    Name / MRN / DOB: Kristy [name]  [id number]  11-08-1941   Date of visit: 12/05/2024   Chief Complaint/Reason for visit:   Current Therapy: Tamoxifen   Assessment & Plan  Patient is a 84 y.o. female with oncologic history of noninvasive breast cancer, estrogen receptor positive followed by Dr. Loretha.  I have viewed most recent oncology note and lab work.    Oncologist will be notified of this visit. Strict ED precautions discussed should symptoms worsen.   Heme/Onc History: Oncology History  Ductal carcinoma in situ (DCIS) of right breast  09/10/2012 Initial Diagnosis   Ductal carcinoma in situ (DCIS) of right breast   12/27/2020 Cancer Staging   Staging form: Breast, AJCC 7th Edition - Pathologic: Stage 0 (Tis (DCIS), N0, cM0) - Signed by Layla Sandria BROCKS, MD on 12/27/2020 Histologic grade (G): G3 Estrogen receptor status: Positive Progesterone receptor status: Negative       Interval history-: Kristy [name] is a 84 y.o. female with oncologic history as above presenting to [facility] today with chief complaint of right breast redness. Patient presents unaccompanied to visit today.  Discussed the use of AI scribe software for clinical note transcription with the patient, who gave verbal consent to proceed.  History of Present Illness Onset of symptoms is today. She noticed the entire breast became red after  taking a shower around 11:30 AM today. She is unsure if the redness developed overnight. There is no associated pain, and she cannot determine if there is any swelling or change in size. No recent injuries, falls, or changes in medications, except for resuming daily aspirin  and taking a multivitamin. She continues to take tamoxifen .  She experiences occasional chills but no fever. She mentions being more cold-natured since her cancer diagnosis. She denies any changes to her nipple, no discharge or bleeding.  She recalls experiencing a 'funny pain' last week, which was attributed to radiation effects.     ROS  All other systems are reviewed and are negative for acute change except as noted in the HPI.    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   Constipation    uses stool softener PRN- uses once a week to once every 2 weeks    Fibromuscular dysplasia    Gallbladder disease    GERD (gastroesophageal reflux disease)  Hyperlipidemia    Hypertension    For 16 years; urinary catecholamines within normal limits 12/09; renal Doppler ultrasound showed no evidence for renal artery stenosis   Normal echocardiogram 02/2009   LVEF 65%; no regional wall motion abnormalities; normal RV size and function; pulmonic valve had increased gradient across w/ peak gradient of about 36 mmHg (range of moderate pulmonic stenosis) followup showed normal valve   Papilloma of breast    right    Peripheral vascular disease    Personal history of radiation therapy    2022   Tinnitus of left ear    Tubular adenoma of colon 10/2013     Past Surgical History:  Procedure Laterality Date   ANGIOPLASTY     2012   BREAST BIOPSY Right 2006   benign   BREAST BIOPSY Right 11/09/2020   BREAST BIOPSY Right 11/23/2020   BREAST BIOPSY Right 11/29/2020   x2   BREAST EXCISIONAL BIOPSY Right 07/2017   benign   BREAST LUMPECTOMY Right 12/16/2020   BREAST LUMPECTOMY WITH RADIOACTIVE SEED LOCALIZATION Right 08/14/2017   Procedure: RIGHT BREAST LUMPECTOMY WITH RADIOACTIVE SEED LOCALIZATION;  Surgeon: Vanderbilt Ned, MD;  Location: [facility];  Service: General;  Laterality: Right;   BREAST LUMPECTOMY WITH RADIOACTIVE SEED LOCALIZATION Right 12/16/2020   Procedure: RADIOACTIVE SEED GUIDED TIMES 3 RIGHT BREAST LUMPECTOMY;  Surgeon: Vanderbilt Ned, MD;  Location: [facility];  Service: General;  Laterality: Right;   Cardiolyte  09/2006   Neg   CHOLECYSTECTOMY N/A 09/22/2021   Procedure: LAPAROSCOPIC CHOLECYSTECTOMY WITH  INTRAOPERATIVE CHOLANGIOGRAM;  Surgeon: Vanderbilt Ned, MD;  Location: [facility];  Service: General;  Laterality: N/A;   COLONOSCOPY     KNEE CARTILAGE SURGERY     right knee   POLYPECTOMY     RE-EXCISION OF BREAST LUMPECTOMY Right 01/11/2021   Procedure: RE-EXCISION OF RIGHT BREAST LUMPECTOMY;  Surgeon: Vanderbilt Ned, MD;  Location: [facility];  Service: General;  Laterality: Right;   TOTAL ABDOMINAL HYSTERECTOMY     no cervix   TOTAL KNEE ARTHROPLASTY Right 07/07/2024   Procedure: ARTHROPLASTY, KNEE, TOTAL;  Surgeon: Edna Toribio LABOR, MD;  Location: [facility];  Service: Orthopedics;  Laterality: Right;   UPPER GASTROINTESTINAL ENDOSCOPY      Social History   Socioeconomic History   Marital status: Widowed    Spouse name: Not on file   Number of children: 0   Years of education: Not on file   Highest education level:  Some college, no degree  Occupational History   Occupation: Educational Psychologist: RETIRED    Comment: Retired   Occupation: retired  Tobacco Use   Smoking status: Former    Current packs/day: 0.00    Average packs/day: 1 pack/day for 15.0 years (15.0 ttl pk-yrs)    Types: Cigarettes    Start date: 11/21/1967    Quit date: 11/20/1982    Years since quitting: 42.0   Smokeless tobacco: Never   Tobacco comments:    15 pack year history  Vaping Use   Vaping status: Never Used  Substance and Sexual Activity   Alcohol  use: No   Drug use: No   Sexual activity: Not on file  Other Topics Concern   Not on file  Social History Narrative   Widowed in 2007-spouse died from colon cancer.   Regular exercise at [facility].   Diet consists of fruits and veggies, snacks a lot.       Social Drivers  of Health   Tobacco Use: Medium Risk (12/02/2024)   Patient History    Smoking Tobacco Use: Former    Smokeless Tobacco Use: Never    Passive Exposure: Not on file  Financial Resource Strain: Low Risk (12/02/2024)   Overall Financial Resource Strain (CARDIA)    Difficulty of Paying Living Expenses: Not hard at all  Food Insecurity: No Food Insecurity (12/02/2024)   Epic    Worried About Programme Researcher, Broadcasting/film/video in the Last Year: Never true    Ran Out of Food in the Last Year: Never true  Transportation Needs: No Transportation Needs (12/02/2024)   Epic    Lack of Transportation (Medical): No    Lack of Transportation (Non-Medical): No  Physical Activity: Insufficiently Active (12/02/2024)   Exercise Vital Sign    Days of Exercise per Week: 5 days    Minutes of Exercise per Session: 20 min  Stress: No Stress Concern Present (12/02/2024)   [employer] of Occupational Health - Occupational Stress Questionnaire    Feeling of Stress: Only a little  Social Connections: Moderately Isolated (12/02/2024)   Social Connection and Isolation Panel    Frequency of Communication with Friends and Family:  More than three times a week    Frequency of Social Gatherings with Friends and Family: Once a week    Attends Religious Services: 1 to 4 times per year    Active Member of [employer] or Organizations: No    Attends Banker Meetings: Not on file    Marital Status: Widowed  Intimate Partner Violence: Not At Risk (04/01/2024)   Humiliation, Afraid, Rape, and Kick questionnaire    Fear of Current or Ex-Partner: No    Emotionally Abused: No    Physically Abused: No    Sexually Abused: No  Depression (PHQ2-9): Low Risk (08/22/2024)   Depression (PHQ2-9)    PHQ-2 Score: 0  Alcohol  Screen: Low Risk (03/25/2024)   Alcohol  Screen    Last Alcohol  Screening Score (AUDIT): 0  Housing: Low Risk (12/02/2024)   Epic    Unable to Pay for Housing in the Last Year: No    Number of Times Moved in the Last Year: 0    Homeless in the Last Year: No  Utilities: Not At Risk (04/01/2024)   AHC Utilities    Threatened with loss of utilities: No  Health Literacy: Adequate Health Literacy (04/01/2024)   B1300 Health Literacy    Frequency of need for help with medical instructions: Never    Family History  Problem Relation Age of Onset   Prostate cancer Father    Lung cancer Father    Colon polyps Sister    Coronary artery disease Paternal Grandmother    Colon cancer Maternal Uncle    Colon cancer Paternal Uncle    Goiter Other        ?   Esophageal cancer Neg Hx    Stomach cancer Neg Hx    Rectal cancer Neg Hx    Bladder Cancer Neg Hx    Kidney cancer Neg Hx    BRCA 1/2 Neg Hx      Current Outpatient Medications:    atorvastatin  (LIPITOR) 20 MG tablet, TAKE 1 TABLET BY MOUTH EVERY DAY, Disp: 90 tablet, Rfl: 3   cholestyramine  (QUESTRAN ) 4 g packet, Take 1 packet (4 g total) by mouth at bedtime. Suggest starting out once daily, can increase up to three times a day as needed, can cause constipation, Disp: 90 packet, Rfl:  0   losartan  (COZAAR ) 50 MG tablet, TAKE 1 TABLET BY MOUTH EVERY DAY,  Disp: 90 tablet, Rfl: 3   metoprolol  succinate (TOPROL -XL) 100 MG 24 hr tablet, TAKE 1 TABLET BY MOUTH DAILY  WITH OR IMMEDIATELY FOLLOWING A  MEAL, Disp: 100 tablet, Rfl: 3   Multiple Vitamin (MULTIVITAMIN WITH MINERALS) TABS tablet, Take 2 tablets by mouth daily., Disp: , Rfl:    ondansetron  (ZOFRAN -ODT) 4 MG disintegrating tablet, Take 1 tablet (4 mg total) by mouth every 8 (eight) hours as needed for nausea or vomiting (preop bowle prep)., Disp: 10 tablet, Rfl: 0   sodium chloride  (OCEAN) 0.65 % SOLN nasal spray, Place 1 spray into both nostrils every 4 (four) hours as needed for congestion., Disp: , Rfl:    tamoxifen  (NOLVADEX ) 20 MG tablet, Take 1 tablet (20 mg total) by mouth daily., Disp: 90 tablet, Rfl: 3   triamterene -hydrochlorothiazide (MAXZIDE-25) 37.5-25 MG tablet, TAKE 1 TABLET BY MOUTH DAILY, Disp: 100 tablet, Rfl: 2  PHYSICAL EXAM: ECOG FS:1 - Symptomatic but completely ambulatory    There were no vitals filed for this visit.  Physical Exam Vitals and nursing note reviewed.  Constitutional:      Appearance: She is not ill-appearing or toxic-appearing.  HENT:     Head: Normocephalic.  Eyes:     Conjunctiva/sclera: Conjunctivae normal.  Cardiovascular:     Rate and Rhythm: Normal rate and regular rhythm.     Pulses: Normal pulses.     Heart sounds: Normal heart sounds.  Pulmonary:     Effort: Pulmonary effort is normal.     Breath sounds: Normal breath sounds.  Chest:     Comments: Erythema noted to right breast. Blanches. No warmth or tenderness. No palpable masses or axillary nodes. Please see media below. Patient gave verbal permission to utilize photo for medical documentation only. Abdominal:     General: There is no distension.  Musculoskeletal:     Cervical back: Normal range of motion.  Skin:    General: Skin is warm and dry.  Neurological:     Mental Status: She is alert.        LABORATORY DATA: I have reviewed the data as listed    Latest Ref  Rng & Units 12/02/2024    2:54 PM 07/30/2024    1:10 PM 06/18/2024    2:06 PM  CBC  WBC 4.0 - 10.5 K/uL 5.7  5.1  7.6   Hemoglobin 12.0 - 15.0 g/dL 87.2  89.0  87.1   Hematocrit 36.0 - 46.0 % 37.3  32.3  39.4   Platelets 150.0 - 400.0 K/uL 301.0  281.0  313         Latest Ref Rng & Units 12/02/2024    2:54 PM 07/30/2024    1:10 PM 06/18/2024    2:06 PM  CMP  Glucose 70 - 99 mg/dL 87  98  92   BUN 6 - 23 mg/dL 18  14  18    Creatinine 0.40 - 1.20 mg/dL 9.20  9.11  9.18   Sodium 135 - 145 mEq/L 138  138  138   Potassium 3.5 - 5.1 mEq/L 3.5  3.6  3.6   Chloride 96 - 112 mEq/L 103  101  103   CO2 19 - 32 mEq/L 27  29  22    Calcium  8.4 - 10.5 mg/dL 89.1  89.6  89.8   Total Protein 6.0 - 8.3 g/dL 7.5  7.1  7.5   Total Bilirubin 0.2 -  1.2 mg/dL 0.4  0.4  0.5   Alkaline Phos 39 - 117 U/L 52  68  48   AST 5 - 37 U/L 16  15  23    ALT 3 - 35 U/L 11  11  20         RADIOGRAPHIC STUDIES (from last 24 hours if applicable) I have personally reviewed the radiological images as listed and agreed with the findings in the report. No results found.      Visit Diagnosis: No diagnosis found.    No orders of the defined types were placed in this encounter.   All questions were answered. The patient knows to call the clinic with any problems, questions or concerns. No barriers to learning was detected.  A total of more than 30 minutes were spent on this encounter with face-to-face time and non-face-to-face time, including preparing to see the patient, ordering tests and/or medications, counseling the patient and coordination of care as outlined above.      Amber Stalls, MD Department of Hematology/Oncology [facility] Phone: [phone]  [phone]    12/05/2024 1:55 PM     "

## 2024-12-09 ENCOUNTER — Telehealth: Payer: Self-pay | Admitting: Family Medicine

## 2024-12-09 NOTE — Telephone Encounter (Unsigned)
 Copied from CRM #[id number]. Topic: Referral - Request for Referral >> Dec 09, 2024 10:55 AM Hadassah PARAS wrote: Did the patient discuss referral with their provider in the last year? Yes (If No - schedule appointment) (If Yes - send message)  Appointment offered? Yes  Type of order/referral and detailed reason for visit: cataract surgery   Preference of office, provider, location: Vaughan BIRCH. Carlie, MD [address], [place], [place] [address] Phone: [phone]   If referral order, have you been seen by this specialty before? No (If Yes, this issue or another issue? When? Where?  Can we respond through MyChart? Yes

## 2024-12-09 NOTE — Telephone Encounter (Signed)
 Spoke with Kristy [name].  She needs a referral to an eye doctor because she needs her cataracts removed.  She does not currenlty have an eye doctor.  She prefers to see one in [place].  She will call us  back with name of eye doctor she wants to be referred to once she has a name. FYI to Dr. Avelina.

## 2024-12-11 ENCOUNTER — Encounter: Payer: Self-pay | Admitting: Internal Medicine

## 2024-12-16 ENCOUNTER — Telehealth: Payer: Self-pay | Admitting: Internal Medicine

## 2024-12-16 NOTE — Telephone Encounter (Signed)
 Spoke with pt. Pt stated that she has an irritation on her buttock and would like to call back and reschedule at a later date. Pt stated that this irration flares up every couple months. Pt chart was reviewed and noted that in her problem list it is noted that she has Herpes simplex virus infection of buttock.  Pt stated that she will call back and reschedule. Pt verbalized understanding with all questions answered.

## 2024-12-16 NOTE — Telephone Encounter (Signed)
 PT is calling to cancel her procedure for 1/29 and the reason is because she has an irritation on her bottom and would like to discuss this with a nurse. Please advise.

## 2024-12-18 ENCOUNTER — Encounter: Admitting: Internal Medicine

## 2024-12-21 ENCOUNTER — Other Ambulatory Visit: Payer: Self-pay | Admitting: Family Medicine

## 2024-12-22 ENCOUNTER — Other Ambulatory Visit: Payer: Self-pay | Admitting: Gastroenterology

## 2024-12-22 DIAGNOSIS — K9089 Other intestinal malabsorption: Secondary | ICD-10-CM

## 2024-12-23 ENCOUNTER — Other Ambulatory Visit: Payer: Self-pay

## 2024-12-23 ENCOUNTER — Telehealth: Payer: Self-pay

## 2024-12-23 MED ORDER — TAMOXIFEN CITRATE 10 MG PO TABS: 10.0000 mg | ORAL_TABLET | Freq: Two times a day (BID) | ORAL | 1 refills | Status: AC

## 2024-12-23 NOTE — Telephone Encounter (Signed)
 Pt called and requested that Tamoxifen  be sent to [facility] home delivery pharmacy.

## 2025-01-03 ENCOUNTER — Other Ambulatory Visit

## 2025-01-08 ENCOUNTER — Institutional Professional Consult (permissible substitution) (INDEPENDENT_AMBULATORY_CARE_PROVIDER_SITE_OTHER)

## 2025-03-31 ENCOUNTER — Ambulatory Visit

## 2025-04-02 ENCOUNTER — Ambulatory Visit

## 2025-04-03 ENCOUNTER — Ambulatory Visit

## 2025-12-11 ENCOUNTER — Inpatient Hospital Stay: Admitting: Hematology and Oncology

## 2026-08-09 ENCOUNTER — Ambulatory Visit (INDEPENDENT_AMBULATORY_CARE_PROVIDER_SITE_OTHER): Admitting: Vascular Surgery

## 2026-08-09 ENCOUNTER — Encounter (INDEPENDENT_AMBULATORY_CARE_PROVIDER_SITE_OTHER)
